# Patient Record
Sex: Male | Born: 1940 | Race: White | Hispanic: No | Marital: Married | Smoking: Former smoker
Health system: Southern US, Community
[De-identification: ages and names within clinical notes are randomized; demographics above are authoritative.]

## PROBLEM LIST (undated history)

## (undated) DIAGNOSIS — C801 Malignant (primary) neoplasm, unspecified: Secondary | ICD-10-CM

## (undated) DIAGNOSIS — H409 Unspecified glaucoma: Secondary | ICD-10-CM

## (undated) DIAGNOSIS — I10 Essential (primary) hypertension: Secondary | ICD-10-CM

## (undated) DIAGNOSIS — M1990 Unspecified osteoarthritis, unspecified site: Secondary | ICD-10-CM

## (undated) DIAGNOSIS — H269 Unspecified cataract: Secondary | ICD-10-CM

## (undated) DIAGNOSIS — E559 Vitamin D deficiency, unspecified: Secondary | ICD-10-CM

## (undated) DIAGNOSIS — N400 Enlarged prostate without lower urinary tract symptoms: Secondary | ICD-10-CM

## (undated) HISTORY — PX: CHOLECYSTECTOMY: SHX55

## (undated) HISTORY — PX: JOINT REPLACEMENT: SHX530

---

## 2000-03-14 ENCOUNTER — Other Ambulatory Visit: Admission: RE | Admit: 2000-03-14 | Discharge: 2000-03-14 | Payer: Self-pay | Admitting: Urology

## 2004-01-20 ENCOUNTER — Ambulatory Visit (HOSPITAL_COMMUNITY): Admission: RE | Admit: 2004-01-20 | Discharge: 2004-01-20 | Payer: Self-pay | Admitting: General Surgery

## 2004-01-31 ENCOUNTER — Encounter: Admission: RE | Admit: 2004-01-31 | Discharge: 2004-01-31 | Payer: Self-pay | Admitting: General Surgery

## 2004-02-01 ENCOUNTER — Ambulatory Visit (HOSPITAL_BASED_OUTPATIENT_CLINIC_OR_DEPARTMENT_OTHER): Admission: RE | Admit: 2004-02-01 | Discharge: 2004-02-01 | Payer: Self-pay | Admitting: General Surgery

## 2004-02-01 ENCOUNTER — Ambulatory Visit (HOSPITAL_COMMUNITY): Admission: RE | Admit: 2004-02-01 | Discharge: 2004-02-01 | Payer: Self-pay | Admitting: General Surgery

## 2004-02-08 ENCOUNTER — Encounter: Admission: RE | Admit: 2004-02-08 | Discharge: 2004-02-08 | Payer: Self-pay | Admitting: General Surgery

## 2005-07-21 IMAGING — CR DG CHEST 2V
2 series · 2 of 2 positions shown · non-contrast
Comparison: none

CLINICAL DATA: Melanoma.
 TWO VIEW CHEST 
 No priors. 
 Heart size upper normal.  No definite lung nodules.  However, there is a possible occult right midlung density overlying the seventh posterior rib.  This may be a confluent shadow of bones and vessels, but an occult right midlung nodule or mass cannot be excluded.  I would recommend a repeat chest x-ray and/or CT for further assessment.  No pleural fluid or osseous lesions. 
 IMPRESSION
 Cannot rule out occult right midlung mass.  See discussion.

[view not recorded (1 of 2)]
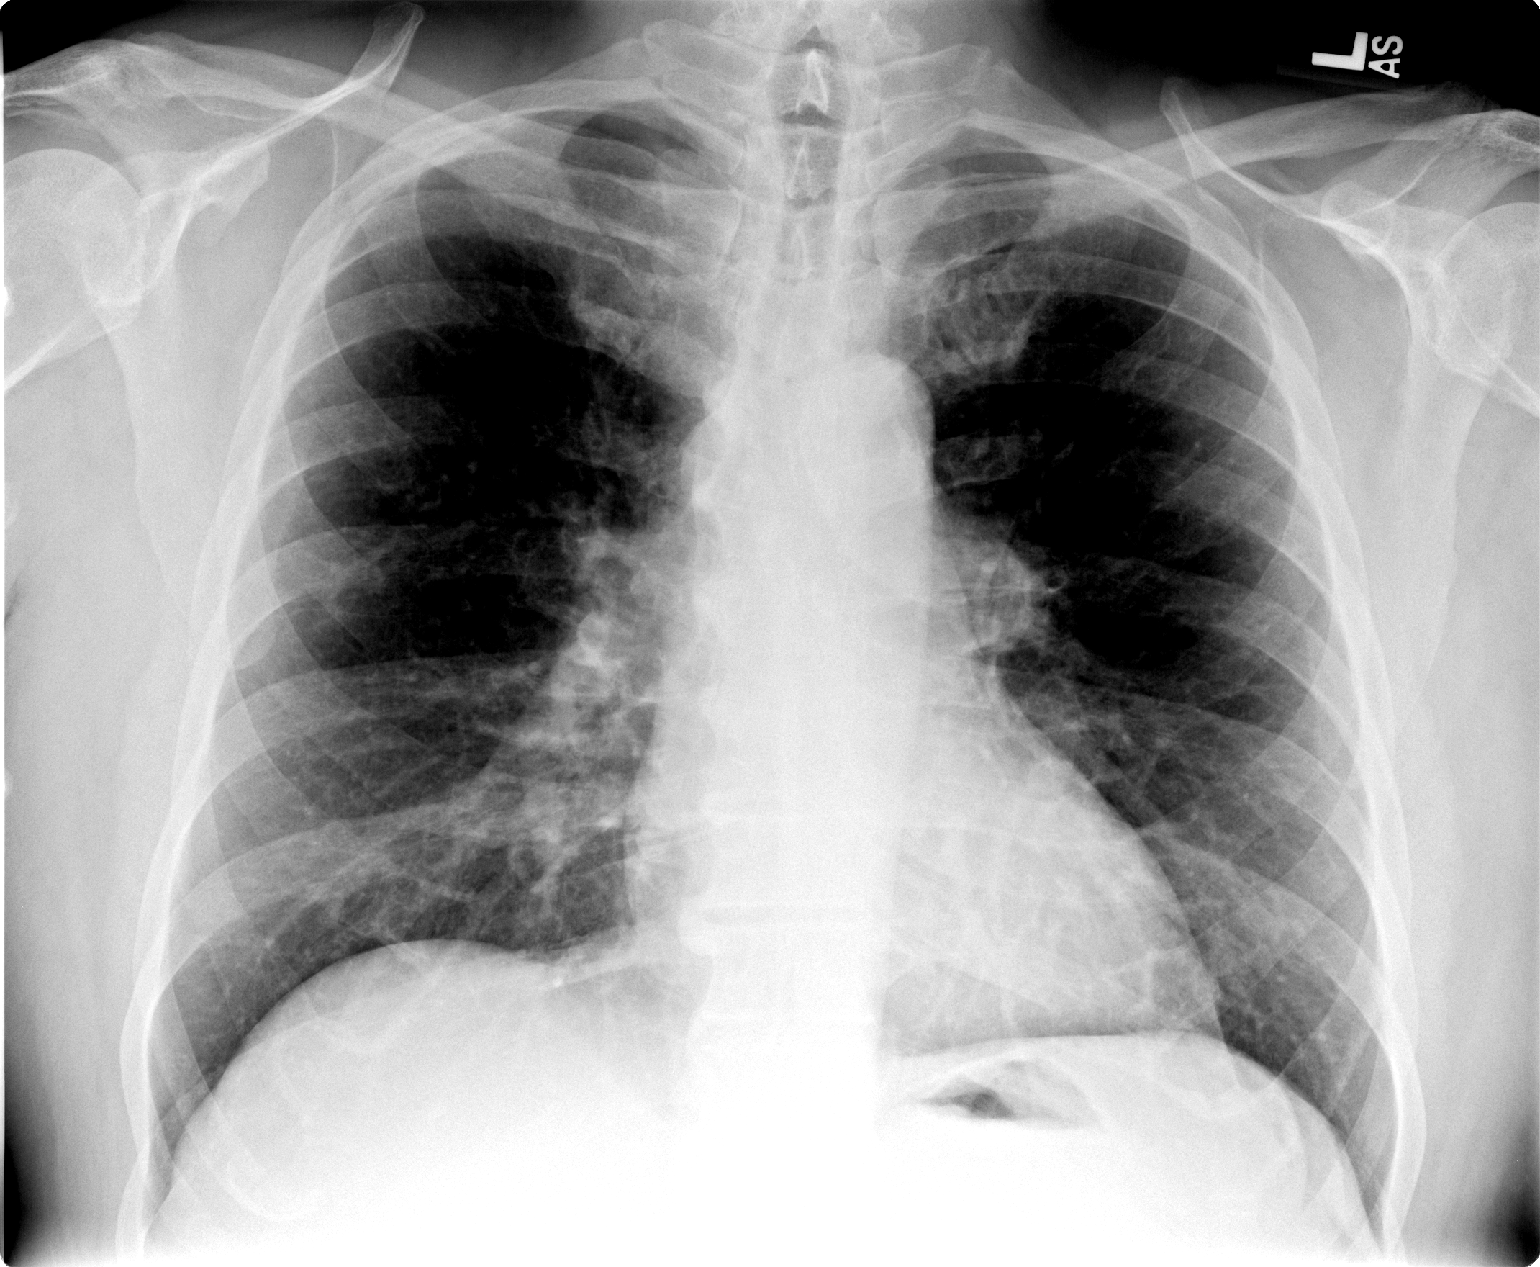

[view not recorded (2 of 2)]
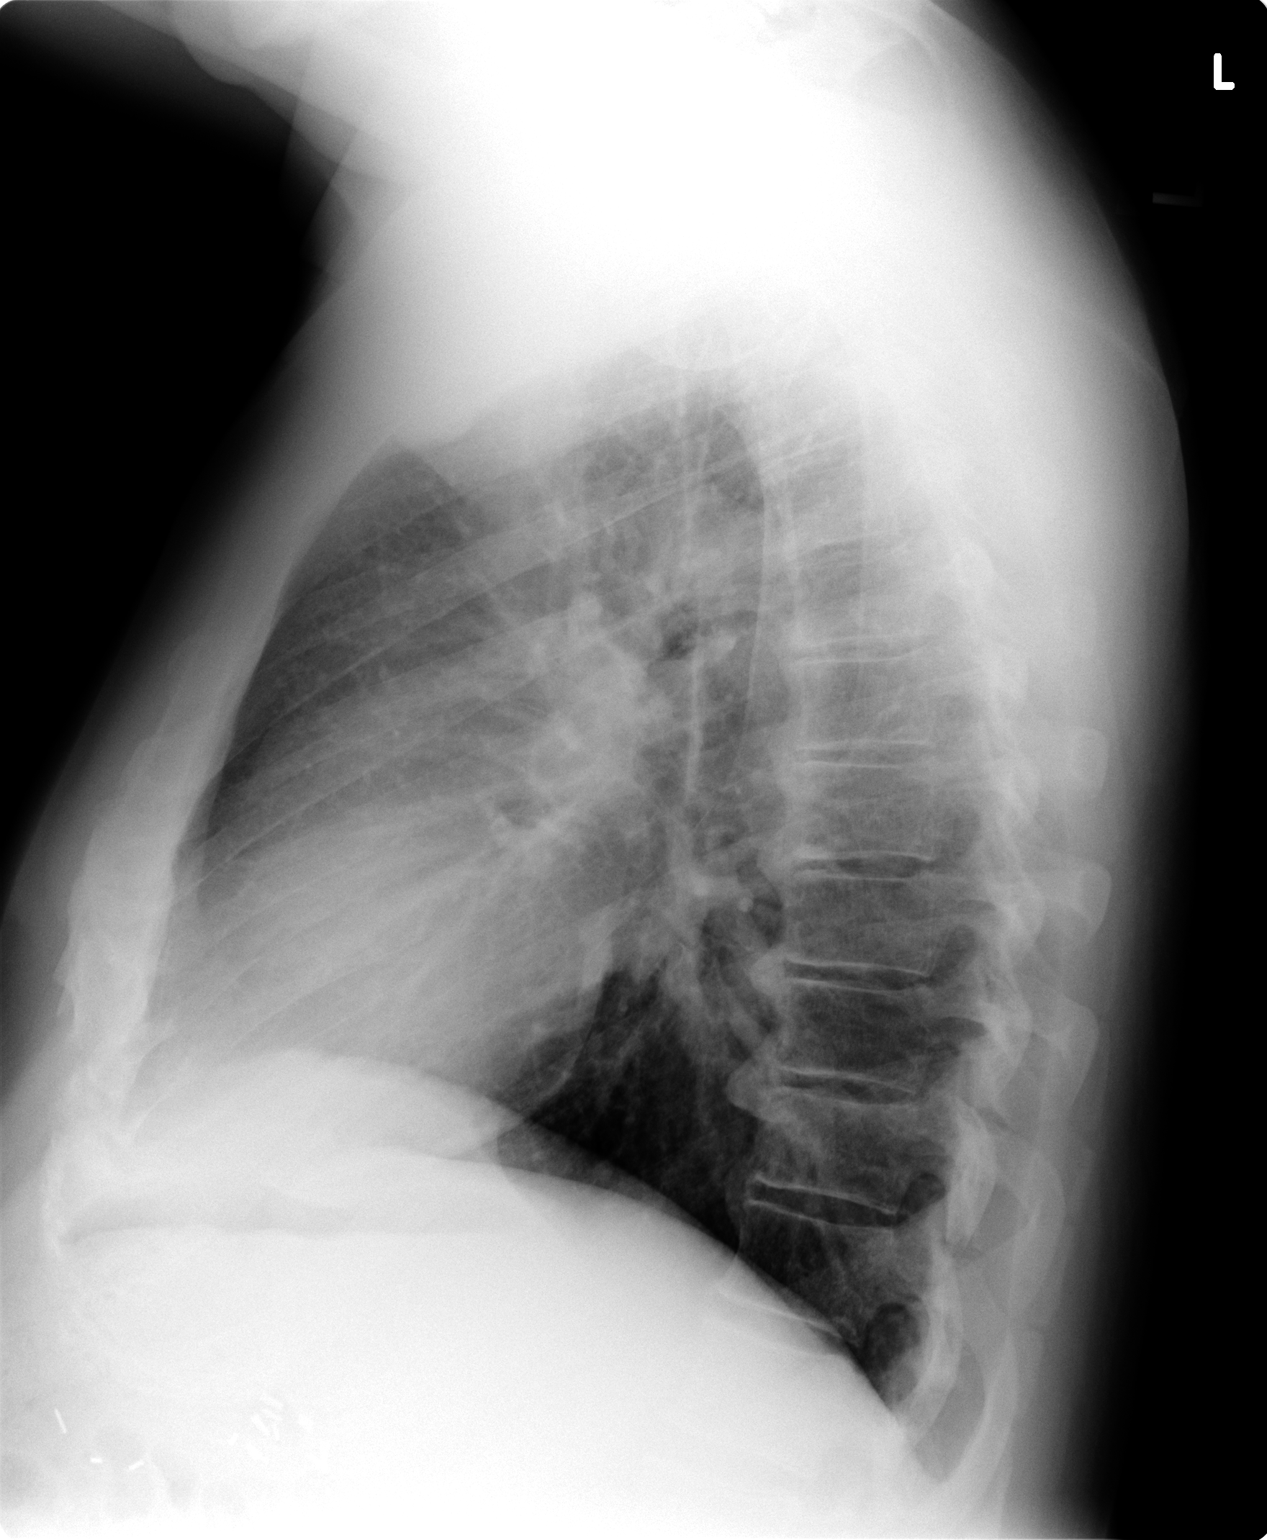

[2 of 2 positions shown; findings below may reference images not displayed]

## 2007-01-15 ENCOUNTER — Inpatient Hospital Stay (HOSPITAL_COMMUNITY): Admission: RE | Admit: 2007-01-15 | Discharge: 2007-01-18 | Payer: Self-pay | Admitting: Orthopedic Surgery

## 2010-11-05 ENCOUNTER — Encounter: Payer: Self-pay | Admitting: General Surgery

## 2011-01-30 ENCOUNTER — Other Ambulatory Visit: Payer: Self-pay | Admitting: Dermatology

## 2011-02-13 ENCOUNTER — Other Ambulatory Visit: Payer: Self-pay | Admitting: Gastroenterology

## 2013-02-06 ENCOUNTER — Other Ambulatory Visit: Payer: Self-pay | Admitting: Dermatology

## 2013-08-03 ENCOUNTER — Other Ambulatory Visit: Payer: Self-pay | Admitting: Dermatology

## 2014-02-02 ENCOUNTER — Other Ambulatory Visit: Payer: Self-pay | Admitting: Dermatology

## 2014-06-15 ENCOUNTER — Other Ambulatory Visit: Payer: Self-pay | Admitting: Dermatology

## 2014-06-29 ENCOUNTER — Ambulatory Visit (INDEPENDENT_AMBULATORY_CARE_PROVIDER_SITE_OTHER): Payer: Medicare Other | Admitting: General Surgery

## 2014-07-06 ENCOUNTER — Ambulatory Visit (INDEPENDENT_AMBULATORY_CARE_PROVIDER_SITE_OTHER): Payer: Medicare Other | Admitting: General Surgery

## 2014-09-30 ENCOUNTER — Other Ambulatory Visit: Payer: Self-pay | Admitting: Dermatology

## 2015-10-16 HISTORY — PX: OTHER SURGICAL HISTORY: SHX169

## 2016-03-27 ENCOUNTER — Other Ambulatory Visit: Payer: Self-pay | Admitting: Gastroenterology

## 2016-07-26 ENCOUNTER — Ambulatory Visit: Payer: Self-pay | Admitting: Orthopedic Surgery

## 2016-10-03 NOTE — Progress Notes (Signed)
Please update surgical orders; orders will expire after 90 days in epic. Thanks.  

## 2016-10-19 ENCOUNTER — Other Ambulatory Visit (HOSPITAL_COMMUNITY): Payer: Self-pay | Admitting: Emergency Medicine

## 2016-10-19 NOTE — Progress Notes (Signed)
07/06/2016: EKG in chart. [facility] Dr Orland Mustard,   10/10/2016: CBC with Diff, BMET, PT/PTT

## 2016-10-19 NOTE — Patient Instructions (Addendum)
Samuel Porter  10/19/2016   Your procedure is scheduled on: 10/31/2016  Report to [facility] Main  Entrance take [facility]  elevators to 3rd floor to  [place] at 1:10PM.  Call this number if you have problems the morning of surgery [phone]   Remember: ONLY 1 PERSON MAY GO WITH YOU TO SHORT STAY TO GET  READY MORNING OF [place].    Do not eat food After Midnight on 10/30/2016. YOU MAY HAVE CLEAR LIQUIDS (DIET LIST BELOW) UNTIL 1000AM DAY OF SURGERY on  10/31/2016. AFTER 1000AM, NOTHING BY MOUTH.     Take these medicines the morning of surgery with A SIP OF WATER: amlodipine(Norvasc), finasteride(Proscar), eye drops                                You may not have any metal on your body including hair pins and              piercings  Do not wear jewelry, make-up, lotions, powders or perfumes, deodorant             Do not wear nail polish.  Do not shave  48 hours prior to surgery.              Men may shave face and neck.   Do not bring valuables to the hospital. [place].  Contacts, dentures or bridgework may not be worn into surgery.  Leave suitcase in the car. After surgery it may be brought to your room.                Please read over the following fact sheets you were given: _____________________________________________________________________     CLEAR LIQUID DIET    Foods Allowed                                                                     Foods Excluded  Coffee and tea, regular and decaf                             liquids that you cannot  Plain Jell-O in any flavor                                             see through such as: Fruit ices (not with fruit pulp)                                     milk, soups, orange juice  Iced Popsicles                                    All solid food Carbonated beverages, regular and diet  Cranberry,  grape and apple juices Sports drinks like Gatorade Lightly seasoned clear broth or consume(fat free) Sugar, honey syrup  Sample Menu Breakfast                                Lunch                                     Supper Cranberry juice                    Beef broth                            Chicken broth Jell-O                                     Grape juice                           Apple juice Coffee or tea                        Jell-O                                      Popsicle                                                Coffee or tea                        Coffee or tea  _____________________________________________________________________             [facility] - Preparing for Surgery Before surgery, you can play an important role.  Because skin is not sterile, your skin needs to be as free of germs as possible.  You can reduce the number of germs on your skin by washing with CHG (chlorahexidine gluconate) soap before surgery.  CHG is an antiseptic cleaner which kills germs and bonds with the skin to continue killing germs even after washing. Please DO NOT use if you have an allergy to CHG or antibacterial soaps.  If your skin becomes reddened/irritated stop using the CHG and inform your nurse when you arrive at Short Stay. Do not shave (including legs and underarms) for at least 48 hours prior to the first CHG shower.  You may shave your face/neck. Please follow these instructions carefully:  1.  Shower with CHG Soap the night before surgery and the  morning of Surgery.  2.  If you choose to wash your hair, wash your hair first as usual with your  normal  shampoo.  3.  After you shampoo, rinse your hair and body thoroughly to remove the  shampoo.                           4.  Use CHG as you would any other liquid soap.  You can apply chg  directly  to the skin and wash                       Gently with a scrungie or clean washcloth.  5.  Apply the CHG Soap to your body ONLY  FROM THE NECK DOWN.   Do not use on face/ open                           Wound or open sores. Avoid contact with eyes, ears mouth and genitals (private parts).                       Wash face,  Genitals (private parts) with your normal soap.             6.  Wash thoroughly, paying special attention to the area where your surgery  will be performed.  7.  Thoroughly rinse your body with warm water from the neck down.  8.  DO NOT shower/wash with your normal soap after using and rinsing off  the CHG Soap.                9.  Pat yourself dry with a clean towel.            10.  Wear clean pajamas.            11.  Place clean sheets on your bed the night of your first shower and do not  sleep with pets. Day of Surgery : Do not apply any lotions/deodorants the morning of surgery.  Please wear clean clothes to the hospital/surgery center.  FAILURE TO FOLLOW THESE INSTRUCTIONS MAY RESULT IN THE CANCELLATION OF YOUR SURGERY PATIENT SIGNATURE_________________________________  NURSE SIGNATURE__________________________________  ________________________________________________________________________   Adam Phenix  An incentive spirometer is a tool that can help keep your lungs clear and active. This tool measures how well you are filling your lungs with each breath. Taking long deep breaths may help reverse or decrease the chance of developing breathing (pulmonary) problems (especially infection) following:  A long period of time when you are unable to move or be active. BEFORE THE PROCEDURE   If the spirometer includes an indicator to show your best effort, your nurse or respiratory therapist will set it to a desired goal.  If possible, sit up straight or lean slightly forward. Try not to slouch.  Hold the incentive spirometer in an upright position. INSTRUCTIONS FOR USE  1. Sit on the edge of your bed if possible, or sit up as far as you can in bed or on a chair. 2. Hold the incentive  spirometer in an upright position. 3. Breathe out normally. 4. Place the mouthpiece in your mouth and seal your lips tightly around it. 5. Breathe in slowly and as deeply as possible, raising the piston or the ball toward the top of the column. 6. Hold your breath for 3-5 seconds or for as long as possible. Allow the piston or ball to fall to the bottom of the column. 7. Remove the mouthpiece from your mouth and breathe out normally. 8. Rest for a few seconds and repeat Steps 1 through 7 at least 10 times every 1-2 hours when you are awake. Take your time and take a few normal breaths between deep breaths. 9. The spirometer may include an indicator to show your best effort. Use the indicator as a goal to work toward during each repetition.  10. After each set of 10 deep breaths, practice coughing to be sure your lungs are clear. If you have an incision (the cut made at the time of surgery), support your incision when coughing by placing a pillow or rolled up towels firmly against it. Once you are able to get out of bed, walk around indoors and cough well. You may stop using the incentive spirometer when instructed by your caregiver.  RISKS AND COMPLICATIONS  Take your time so you do not get dizzy or light-headed.  If you are in pain, you may need to take or ask for pain medication before doing incentive spirometry. It is harder to take a deep breath if you are having pain. AFTER USE  Rest and breathe slowly and easily.  It can be helpful to keep track of a log of your progress. Your caregiver can provide you with a simple table to help with this. If you are using the spirometer at home, follow these instructions: [place] IF:   You are having difficultly using the spirometer.  You have trouble using the spirometer as often as instructed.  Your pain medication is not giving enough relief while using the spirometer.  You develop fever of 100.5 F (38.1 C) or higher. SEEK  IMMEDIATE MEDICAL CARE IF:   You cough up bloody sputum that had not been present before.  You develop fever of 102 F (38.9 C) or greater.  You develop worsening pain at or near the incision site. MAKE SURE YOU:   Understand these instructions.  Will watch your condition.  Will get help right away if you are not doing well or get worse. Document Released: 02/11/2007 Document Revised: 12/24/2011 Document Reviewed: 04/14/2007 ExitCare Patient Information 2014 ExitCare, [place].   ________________________________________________________________________  WHAT IS A BLOOD TRANSFUSION? Blood Transfusion Information  A transfusion is the replacement of blood or some of its parts. Blood is made up of multiple cells which provide different functions.  Red blood cells carry oxygen and are used for blood loss replacement.  White blood cells fight against infection.  Platelets control bleeding.  Plasma helps clot blood.  Other blood products are available for specialized needs, such as hemophilia or other clotting disorders. BEFORE THE TRANSFUSION  Who gives blood for transfusions?   Healthy volunteers who are fully evaluated to make sure their blood is safe. This is blood bank blood. Transfusion therapy is the safest it has ever been in the practice of medicine. Before blood is taken from a donor, a complete history is taken to make sure that person has no history of diseases nor engages in risky social behavior (examples are intravenous drug use or sexual activity with multiple partners). The donor's travel history is screened to minimize risk of transmitting infections, such as malaria. The donated blood is tested for signs of infectious diseases, such as HIV and hepatitis. The blood is then tested to be sure it is compatible with you in order to minimize the chance of a transfusion reaction. If you or a relative donates blood, this is often done in anticipation of surgery and is not  appropriate for emergency situations. It takes many days to process the donated blood. RISKS AND COMPLICATIONS Although transfusion therapy is very safe and saves many lives, the main dangers of transfusion include:   Getting an infectious disease.  Developing a transfusion reaction. This is an allergic reaction to something in the blood you were given. Every precaution is taken to prevent this. The  decision to have a blood transfusion has been considered carefully by your caregiver before blood is given. Blood is not given unless the benefits outweigh the risks. AFTER THE TRANSFUSION  Right after receiving a blood transfusion, you will usually feel much better and more energetic. This is especially true if your red blood cells have gotten low (anemic). The transfusion raises the level of the red blood cells which carry oxygen, and this usually causes an energy increase.  The nurse administering the transfusion will monitor you carefully for complications. HOME CARE INSTRUCTIONS  No special instructions are needed after a transfusion. You may find your energy is better. Speak with your caregiver about any limitations on activity for underlying diseases you may have. SEEK MEDICAL CARE IF:   Your condition is not improving after your transfusion.  You develop redness or irritation at the intravenous (IV) site. SEEK IMMEDIATE MEDICAL CARE IF:  Any of the following symptoms occur over the next 12 hours:  Shaking chills.  You have a temperature by mouth above 102 F (38.9 C), not controlled by medicine.  Chest, back, or muscle pain.  People around you feel you are not acting correctly or are confused.  Shortness of breath or difficulty breathing.  Dizziness and fainting.  You get a rash or develop hives.  You have a decrease in urine output.  Your urine turns a dark color or changes to pink, red, or brown. Any of the following symptoms occur over the next 10 days:  You have a  temperature by mouth above 102 F (38.9 C), not controlled by medicine.  Shortness of breath.  Weakness after normal activity.  The white part of the eye turns yellow (jaundice).  You have a decrease in the amount of urine or are urinating less often.  Your urine turns a dark color or changes to pink, red, or brown. Document Released: 09/28/2000 Document Revised: 12/24/2011 Document Reviewed: 05/17/2008 [facility] Patient Information 2014 [place], [place].  _______________________________________________________________________

## 2016-10-23 ENCOUNTER — Ambulatory Visit: Payer: Self-pay | Admitting: Orthopedic Surgery

## 2016-10-23 ENCOUNTER — Encounter (HOSPITAL_COMMUNITY): Admission: RE | Admit: 2016-10-23 | Payer: Medicare Other | Source: Ambulatory Visit

## 2016-10-23 ENCOUNTER — Encounter (HOSPITAL_COMMUNITY)
Admission: RE | Admit: 2016-10-23 | Discharge: 2016-10-23 | Disposition: A | Discharge status: Home / Self Care | Payer: Medicare Other | Source: Ambulatory Visit | Attending: Orthopedic Surgery | Admitting: Orthopedic Surgery

## 2016-10-23 ENCOUNTER — Encounter (HOSPITAL_COMMUNITY): Payer: Self-pay

## 2016-10-23 DIAGNOSIS — M1712 Unilateral primary osteoarthritis, left knee: Secondary | ICD-10-CM | POA: Insufficient documentation

## 2016-10-23 DIAGNOSIS — Z01818 Encounter for other preprocedural examination: Secondary | ICD-10-CM | POA: Insufficient documentation

## 2016-10-23 HISTORY — DX: Benign prostatic hyperplasia without lower urinary tract symptoms: N40.0

## 2016-10-23 HISTORY — DX: Vitamin D deficiency, unspecified: E55.9

## 2016-10-23 HISTORY — DX: Unspecified osteoarthritis, unspecified site: M19.90

## 2016-10-23 HISTORY — DX: Malignant (primary) neoplasm, unspecified: C80.1

## 2016-10-23 HISTORY — DX: Unspecified glaucoma: H40.9

## 2016-10-23 HISTORY — DX: Unspecified cataract: H26.9

## 2016-10-23 HISTORY — DX: Essential (primary) hypertension: I10

## 2016-10-23 LAB — URINALYSIS, ROUTINE W REFLEX MICROSCOPIC
Bilirubin Urine: NEGATIVE
GLUCOSE, UA: NEGATIVE mg/dL
Hgb urine dipstick: NEGATIVE
KETONES UR: NEGATIVE mg/dL
LEUKOCYTES UA: NEGATIVE
Nitrite: NEGATIVE
PH: 7 (ref 5.0–8.0)
Protein, ur: NEGATIVE mg/dL
SPECIFIC GRAVITY, URINE: 1.013 (ref 1.005–1.030)

## 2016-10-23 LAB — HEPATIC FUNCTION PANEL
ALK PHOS: 44 U/L (ref 38–126)
ALT: 14 U/L — ABNORMAL LOW (ref 17–63)
AST: 16 U/L (ref 15–41)
Albumin: 4 g/dL (ref 3.5–5.0)
BILIRUBIN DIRECT: 0.2 mg/dL (ref 0.1–0.5)
BILIRUBIN TOTAL: 1.2 mg/dL (ref 0.3–1.2)
Indirect Bilirubin: 1 mg/dL — ABNORMAL HIGH (ref 0.3–0.9)
Total Protein: 7.3 g/dL (ref 6.5–8.1)

## 2016-10-23 LAB — SURGICAL PCR SCREEN
MRSA, PCR: NEGATIVE
STAPHYLOCOCCUS AUREUS: NEGATIVE

## 2016-10-23 LAB — ABO/RH: ABO/RH(D): A POS

## 2016-10-23 NOTE — Progress Notes (Signed)
PATIENT SAW IN PERE OP TODAY, ORDERS PUT IN FOR SURGERY 07-26-16 WILL DROP OUT BEFORE SURGERY DATE OF 10-31-16, PLEASE ONLY PUT IN WHAT YOU WANT DAY OF SURGERY, SINCE ALL PRE OP ORDERS HAVE BEEN COMPLETED.

## 2016-10-30 ENCOUNTER — Ambulatory Visit: Payer: Self-pay | Admitting: Orthopedic Surgery

## 2016-10-30 NOTE — H&P (Signed)
Samuel Porter DOB: [birth year] Married / Language: English / Race: White Male Date of Admission:  10/31/2016  CC:  Left Hip Pain History of Present Illness  The patient is a 76 year old male who comes in for a preoperative History and Physical. The patient is scheduled for a left total hip arthroplasty (anterior) to be performed by Dr. Dione Plover. [name], MD at [facility] on 10-31-2016. The patient is a 76 year old male who presented for follow up of their hip. The patient is being followed for their left hip pain and osteoarthritis. They are months out from intra-articular injection (left hip). Symptoms reported include: pain, pain at night, aching and difficulty ambulating. The patient feels that they are doing well and report their pain level to be moderate to severe. The following medication has been used for pain control: aspirin (prn). The patient has reported improvement of their symptoms with: Cortisone injections. [name]'s hip unfortunately has gotten much worse. We replaced his right hip several years ago, he did great with that. It was done via posterior approach. The left hip is hurting him at all times, it is bad as the right hip was prior to when he had surgery on it. He said that it is definitely limiting what he can and cannot do. Cortisone injection helped to a certain degree, but has not allowed him to be more active. He is ready to proceed with surgery on the left hip at this time. They have been treated conservatively in the past for the above stated problem and despite conservative measures, they continue to have progressive pain and severe functional limitations and dysfunction. They have failed non-operative management including home exercise, medications, and injections. It is felt that they would benefit from undergoing total joint replacement. Risks and benefits of the procedure have been discussed with the patient and they elect to proceed with surgery. There are no  active contraindications to surgery such as ongoing infection or rapidly progressive neurological disease.  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)  Primary osteoarthritis of left hip (M16.12)  High blood pressure  Skin Cancer  Glaucoma  Enlarged prostate   Allergies No Known Drug Allergies   Family History Cancer  mother, father and sister Heart Disease  brother  Social History Alcohol use  current drinker; drinks beer and wine; 5-7 per week Children  4 Current work status  retired Engineer, agricultural (Currently)  no Drug/Alcohol Rehab (Previously)  no Exercise  Exercises weekly; does other Illicit drug use  no Living situation  live with spouse Marital status  married Number of flights of stairs before winded  4-5 Pain Contract  no Tobacco / smoke exposure  no Tobacco use  former smoker; smoke(d) 1 pack(s) per day Advance Directives  Living Will, Healthcare POA  Medication History AmLODIPine Besylate (5MG  Tablet, Oral) Active. Cosopt (Ophthalmic) Specific strength unknown - Active. Xalatan (Ophthalmic) Specific strength unknown - Active. Finasteride (5MG  Tablet, Oral) Active. ASA Arthritis Strength/Antacid (Oral) Specific strength unknown - Active. Vit D-Vit E-Safflower Oil (External) Active.  Past Surgical History Colon Polyp Removal - Colonoscopy  Gallbladder Surgery  open Tonsillectomy  Total Hip Replacement  right Mohs Procedure    Review of Systems  General Not Present- Chills, Fatigue, Fever, Memory Loss, Night Sweats, Weight Gain and Weight Loss. Skin Not Present- Eczema, Hives, Itching, Lesions and Rash. HEENT Not Present- Dentures, Double Vision, Headache, Hearing Loss, Tinnitus and Visual Loss. Respiratory Not Present- Allergies, Chronic Cough, Coughing  up blood, Shortness of breath at rest and Shortness of breath with exertion. Cardiovascular Not Present- Chest Pain, Difficulty Breathing  Lying Down, Murmur, Palpitations, Racing/skipping heartbeats and Swelling. Gastrointestinal Not Present- Abdominal Pain, Bloody Stool, Constipation, Diarrhea, Difficulty Swallowing, Heartburn, Jaundice, Loss of appetitie, Nausea and Vomiting. Male Genitourinary Not Present- Blood in Urine, Discharge, Flank Pain, Incontinence, Painful Urination, Urgency, Urinary frequency, Urinary Retention, Urinating at Night and Weak urinary stream. Musculoskeletal Present- Joint Pain and Morning Stiffness. Not Present- Back Pain, Joint Swelling, Muscle Pain, Muscle Weakness and Spasms. Neurological Not Present- Blackout spells, Difficulty with balance, Dizziness, Paralysis, Tremor and Weakness. Psychiatric Not Present- Insomnia.  Vitals Weight: 215 lb Height: 74in Weight was reported by patient. Height was reported by patient. Body Surface Area: 2.24 m Body Mass Index: 27.6 kg/m  Pulse: 56 (Regular)  BP: 132/70 (Sitting, Left Arm, Standard)  Physical Exam General Mental Status -Alert, cooperative and good historian. General Appearance-pleasant, Not in acute distress. Orientation-Oriented X3. Build & Nutrition-Well nourished and Well developed.  Head and Neck Head-normocephalic, atraumatic . Neck Global Assessment - supple, no bruit auscultated on the right, no bruit auscultated on the left.  Eye Pupil - Bilateral-Regular and Round. Motion - Bilateral-EOMI.  Chest and Lung Exam Auscultation Breath sounds - clear at anterior chest wall and clear at posterior chest wall. Adventitious sounds - No Adventitious sounds.  Cardiovascular Auscultation Rhythm - Regular rate and rhythm. Heart Sounds - S1 WNL and S2 WNL. Murmurs & Other Heart Sounds - Auscultation of the heart reveals - No Murmurs.  Abdomen Palpation/Percussion Tenderness - Abdomen is non-tender to palpation. Rigidity (guarding) - Abdomen is soft. Auscultation Auscultation of the abdomen reveals - Bowel sounds  normal.  Male Genitourinary Note: Not done, not pertinent to present illness   Musculoskeletal Note: On exam, he is in no distress. His left hip can be flexed to about 90 with no internal rotation about 10 degrees of external rotation, 20 degrees abduction. Right hip flexion 120, rotation in 30 out 40, abduct 40 without discomfort.  IMAGING We reviewed his radiographs, AP pelvis and lateral of the left hip, he has got severe bone on bone arthritis of the left hip with subchondral cystic formation.  Assessment & Plan  Primary osteoarthritis of left hip (M16.12)  Note:Surgical Plans: Left Total Hip Replacement - Anterior Approach  Disposition: Home  PCP: Dr. Orland Mustard [facility]  IV TXA  Anesthesia Issues: None  Signed electronically by Ok Edwards, III PA-C

## 2016-10-31 ENCOUNTER — Inpatient Hospital Stay (HOSPITAL_COMMUNITY)
Admission: RE | Admit: 2016-10-31 | Discharge: 2016-11-02 | DRG: 470 | Disposition: A | Discharge status: Home / Self Care | Payer: Medicare Other | Source: Ambulatory Visit | Attending: Orthopedic Surgery | Admitting: Orthopedic Surgery

## 2016-10-31 ENCOUNTER — Inpatient Hospital Stay (HOSPITAL_COMMUNITY): Payer: Medicare Other | Admitting: Certified Registered Nurse Anesthetist

## 2016-10-31 ENCOUNTER — Inpatient Hospital Stay (HOSPITAL_COMMUNITY): Payer: Medicare Other

## 2016-10-31 ENCOUNTER — Encounter (HOSPITAL_COMMUNITY): Payer: Self-pay | Admitting: *Deleted

## 2016-10-31 ENCOUNTER — Encounter (HOSPITAL_COMMUNITY)
Admission: RE | Disposition: A | Discharge status: Home / Self Care | Payer: Self-pay | Source: Ambulatory Visit | Attending: Orthopedic Surgery

## 2016-10-31 DIAGNOSIS — H269 Unspecified cataract: Secondary | ICD-10-CM | POA: Diagnosis present

## 2016-10-31 DIAGNOSIS — E559 Vitamin D deficiency, unspecified: Secondary | ICD-10-CM | POA: Diagnosis present

## 2016-10-31 DIAGNOSIS — Z96649 Presence of unspecified artificial hip joint: Secondary | ICD-10-CM

## 2016-10-31 DIAGNOSIS — N400 Enlarged prostate without lower urinary tract symptoms: Secondary | ICD-10-CM | POA: Diagnosis present

## 2016-10-31 DIAGNOSIS — M1612 Unilateral primary osteoarthritis, left hip: Principal | ICD-10-CM | POA: Diagnosis present

## 2016-10-31 DIAGNOSIS — M720 Palmar fascial fibromatosis [Dupuytren]: Secondary | ICD-10-CM | POA: Diagnosis present

## 2016-10-31 DIAGNOSIS — I10 Essential (primary) hypertension: Secondary | ICD-10-CM | POA: Diagnosis present

## 2016-10-31 DIAGNOSIS — Z85820 Personal history of malignant melanoma of skin: Secondary | ICD-10-CM

## 2016-10-31 DIAGNOSIS — H409 Unspecified glaucoma: Secondary | ICD-10-CM | POA: Diagnosis present

## 2016-10-31 DIAGNOSIS — Z87891 Personal history of nicotine dependence: Secondary | ICD-10-CM

## 2016-10-31 DIAGNOSIS — M169 Osteoarthritis of hip, unspecified: Secondary | ICD-10-CM | POA: Diagnosis present

## 2016-10-31 DIAGNOSIS — Z79899 Other long term (current) drug therapy: Secondary | ICD-10-CM | POA: Diagnosis not present

## 2016-10-31 HISTORY — PX: TOTAL HIP ARTHROPLASTY: SHX124

## 2016-10-31 LAB — TYPE AND SCREEN
ABO/RH(D): A POS
ANTIBODY SCREEN: NEGATIVE

## 2016-10-31 SURGERY — ARTHROPLASTY, HIP, TOTAL, ANTERIOR APPROACH
Anesthesia: Spinal | Site: Hip | Laterality: Left

## 2016-10-31 MED ORDER — DOCUSATE SODIUM 100 MG PO CAPS
100.0000 mg | ORAL_CAPSULE | Freq: Two times a day (BID) | ORAL | Status: DC
  Administered 2016-10-31 – 2016-11-02 (×4): 100 mg via ORAL
  Filled 2016-10-31 (×4): qty 1

## 2016-10-31 MED ORDER — DORZOLAMIDE HCL-TIMOLOL MAL 2-0.5 % OP SOLN: 1.0000 [drp] | Freq: Two times a day (BID) | OPHTHALMIC | Status: DC

## 2016-10-31 MED ORDER — CEFAZOLIN SODIUM-DEXTROSE 2-4 GM/100ML-% IV SOLN
2.0000 g | INTRAVENOUS | Status: AC
  Administered 2016-10-31: 2 g via INTRAVENOUS
  Filled 2016-10-31: qty 100

## 2016-10-31 MED ORDER — PROPOFOL 500 MG/50ML IV EMUL
INTRAVENOUS | Status: DC | PRN
  Administered 2016-10-31: 75 ug/kg/min via INTRAVENOUS

## 2016-10-31 MED ORDER — ACETAMINOPHEN 10 MG/ML IV SOLN
INTRAVENOUS | Status: AC
  Filled 2016-10-31: qty 100

## 2016-10-31 MED ORDER — PROPOFOL 500 MG/50ML IV EMUL
INTRAVENOUS | Status: DC | PRN
  Administered 2016-10-31 (×2): 30 mg via INTRAVENOUS

## 2016-10-31 MED ORDER — DEXAMETHASONE SODIUM PHOSPHATE 10 MG/ML IJ SOLN
10.0000 mg | Freq: Once | INTRAMUSCULAR | Status: AC
  Administered 2016-11-01: 10 mg via INTRAVENOUS
  Filled 2016-10-31: qty 1

## 2016-10-31 MED ORDER — BISACODYL 10 MG RE SUPP: 10.0000 mg | Freq: Every day | RECTAL | Status: DC | PRN

## 2016-10-31 MED ORDER — PROPOFOL 10 MG/ML IV BOLUS
INTRAVENOUS | Status: AC
  Filled 2016-10-31: qty 20

## 2016-10-31 MED ORDER — METHOCARBAMOL 500 MG PO TABS
500.0000 mg | ORAL_TABLET | Freq: Four times a day (QID) | ORAL | Status: DC | PRN
  Administered 2016-11-01 – 2016-11-02 (×3): 500 mg via ORAL
  Filled 2016-10-31 (×4): qty 1

## 2016-10-31 MED ORDER — FLEET ENEMA 7-19 GM/118ML RE ENEM: 1.0000 | ENEMA | Freq: Once | RECTAL | Status: DC | PRN

## 2016-10-31 MED ORDER — FENTANYL CITRATE (PF) 100 MCG/2ML IJ SOLN
INTRAMUSCULAR | Status: DC | PRN
  Administered 2016-10-31: 100 ug via INTRAVENOUS

## 2016-10-31 MED ORDER — PROPOFOL 10 MG/ML IV BOLUS
INTRAVENOUS | Status: AC
  Filled 2016-10-31: qty 40

## 2016-10-31 MED ORDER — MORPHINE SULFATE (PF) 2 MG/ML IV SOLN: 1.0000 mg | INTRAVENOUS | Status: DC | PRN

## 2016-10-31 MED ORDER — ACETAMINOPHEN 10 MG/ML IV SOLN
1000.0000 mg | Freq: Once | INTRAVENOUS | Status: AC
  Administered 2016-10-31: 1000 mg via INTRAVENOUS
  Filled 2016-10-31: qty 100

## 2016-10-31 MED ORDER — CHLORHEXIDINE GLUCONATE 4 % EX LIQD: 60.0000 mL | Freq: Once | CUTANEOUS | Status: DC

## 2016-10-31 MED ORDER — MEPERIDINE HCL 50 MG/ML IJ SOLN: 6.2500 mg | INTRAMUSCULAR | Status: DC | PRN

## 2016-10-31 MED ORDER — MIDAZOLAM HCL 5 MG/5ML IJ SOLN
INTRAMUSCULAR | Status: DC | PRN
  Administered 2016-10-31: 2 mg via INTRAVENOUS

## 2016-10-31 MED ORDER — ONDANSETRON HCL 4 MG/2ML IJ SOLN: 4.0000 mg | Freq: Once | INTRAMUSCULAR | Status: DC | PRN

## 2016-10-31 MED ORDER — POLYETHYLENE GLYCOL 3350 17 G PO PACK: 17.0000 g | PACK | Freq: Every day | ORAL | Status: DC | PRN

## 2016-10-31 MED ORDER — CEFAZOLIN SODIUM-DEXTROSE 2-4 GM/100ML-% IV SOLN
INTRAVENOUS | Status: AC
  Filled 2016-10-31: qty 100

## 2016-10-31 MED ORDER — BUPIVACAINE HCL (PF) 0.25 % IJ SOLN
INTRAMUSCULAR | Status: DC | PRN
  Administered 2016-10-31: 30 mL

## 2016-10-31 MED ORDER — BUPIVACAINE HCL (PF) 0.5 % IJ SOLN
INTRAMUSCULAR | Status: DC | PRN
  Administered 2016-10-31: 3 mL

## 2016-10-31 MED ORDER — DORZOLAMIDE HCL 2 % OP SOLN
1.0000 [drp] | Freq: Two times a day (BID) | OPHTHALMIC | Status: DC
  Administered 2016-10-31 – 2016-11-02 (×4): 1 [drp] via OPHTHALMIC
  Filled 2016-10-31: qty 10

## 2016-10-31 MED ORDER — METOCLOPRAMIDE HCL 5 MG PO TABS: 5.0000 mg | ORAL_TABLET | Freq: Three times a day (TID) | ORAL | Status: DC | PRN

## 2016-10-31 MED ORDER — LATANOPROST 0.005 % OP SOLN
1.0000 [drp] | Freq: Every day | OPHTHALMIC | Status: DC
  Administered 2016-10-31 – 2016-11-01 (×2): 1 [drp] via OPHTHALMIC
  Filled 2016-10-31: qty 2.5

## 2016-10-31 MED ORDER — SODIUM CHLORIDE 0.9 % IV SOLN: 1000.0000 mg | INTRAVENOUS | Status: DC

## 2016-10-31 MED ORDER — ACETAMINOPHEN 500 MG PO TABS
1000.0000 mg | ORAL_TABLET | Freq: Four times a day (QID) | ORAL | Status: AC
  Administered 2016-10-31 – 2016-11-01 (×4): 1000 mg via ORAL
  Filled 2016-10-31 (×4): qty 2

## 2016-10-31 MED ORDER — ONDANSETRON HCL 4 MG PO TABS: 4.0000 mg | ORAL_TABLET | Freq: Four times a day (QID) | ORAL | Status: DC | PRN

## 2016-10-31 MED ORDER — 0.9 % SODIUM CHLORIDE (POUR BTL) OPTIME
TOPICAL | Status: DC | PRN
  Administered 2016-10-31: 1000 mL

## 2016-10-31 MED ORDER — PHENOL 1.4 % MT LIQD: 1.0000 | OROMUCOSAL | Status: DC | PRN

## 2016-10-31 MED ORDER — HYDROMORPHONE HCL 1 MG/ML IJ SOLN: 0.2500 mg | INTRAMUSCULAR | Status: DC | PRN

## 2016-10-31 MED ORDER — DEXAMETHASONE SODIUM PHOSPHATE 10 MG/ML IJ SOLN
10.0000 mg | Freq: Once | INTRAMUSCULAR | Status: AC
  Administered 2016-10-31: 10 mg via INTRAVENOUS

## 2016-10-31 MED ORDER — ONDANSETRON HCL 4 MG/2ML IJ SOLN: 4.0000 mg | Freq: Four times a day (QID) | INTRAMUSCULAR | Status: DC | PRN

## 2016-10-31 MED ORDER — FINASTERIDE 5 MG PO TABS
5.0000 mg | ORAL_TABLET | Freq: Every day | ORAL | Status: DC
  Administered 2016-11-01 – 2016-11-02 (×2): 5 mg via ORAL
  Filled 2016-10-31 (×2): qty 1

## 2016-10-31 MED ORDER — TRANEXAMIC ACID 1000 MG/10ML IV SOLN
1000.0000 mg | Freq: Once | INTRAVENOUS | Status: AC
  Administered 2016-10-31: 19:00:00 1000 mg via INTRAVENOUS
  Filled 2016-10-31: qty 10

## 2016-10-31 MED ORDER — ACETAMINOPHEN 650 MG RE SUPP: 650.0000 mg | Freq: Four times a day (QID) | RECTAL | Status: DC | PRN

## 2016-10-31 MED ORDER — MENTHOL 3 MG MT LOZG: 1.0000 | LOZENGE | OROMUCOSAL | Status: DC | PRN

## 2016-10-31 MED ORDER — SODIUM CHLORIDE 0.9 % IV SOLN
INTRAVENOUS | Status: DC
  Administered 2016-10-31: 18:00:00 100 mL/h via INTRAVENOUS

## 2016-10-31 MED ORDER — MIDAZOLAM HCL 2 MG/2ML IJ SOLN
INTRAMUSCULAR | Status: AC
  Filled 2016-10-31: qty 2

## 2016-10-31 MED ORDER — TRAMADOL HCL 50 MG PO TABS: 50.0000 mg | ORAL_TABLET | Freq: Four times a day (QID) | ORAL | Status: DC | PRN

## 2016-10-31 MED ORDER — EPHEDRINE SULFATE 50 MG/ML IJ SOLN
INTRAMUSCULAR | Status: DC | PRN
  Administered 2016-10-31 (×2): 10 mg via INTRAVENOUS

## 2016-10-31 MED ORDER — FENTANYL CITRATE (PF) 100 MCG/2ML IJ SOLN
INTRAMUSCULAR | Status: AC
  Filled 2016-10-31: qty 2

## 2016-10-31 MED ORDER — RIVAROXABAN 10 MG PO TABS
10.0000 mg | ORAL_TABLET | Freq: Every day | ORAL | Status: DC
  Administered 2016-11-01 – 2016-11-02 (×2): 10 mg via ORAL
  Filled 2016-10-31 (×2): qty 1

## 2016-10-31 MED ORDER — AMLODIPINE BESYLATE 5 MG PO TABS
5.0000 mg | ORAL_TABLET | Freq: Every day | ORAL | Status: DC
  Administered 2016-11-01 – 2016-11-02 (×2): 5 mg via ORAL
  Filled 2016-10-31 (×2): qty 1

## 2016-10-31 MED ORDER — TRANEXAMIC ACID 1000 MG/10ML IV SOLN
1000.0000 mg | INTRAVENOUS | Status: AC
  Administered 2016-10-31: 1000 mg via INTRAVENOUS
  Filled 2016-10-31: qty 10

## 2016-10-31 MED ORDER — METHOCARBAMOL 1000 MG/10ML IJ SOLN
500.0000 mg | Freq: Four times a day (QID) | INTRAVENOUS | Status: DC | PRN
  Administered 2016-10-31: 500 mg via INTRAVENOUS
  Filled 2016-10-31: qty 550
  Filled 2016-10-31: qty 5

## 2016-10-31 MED ORDER — CEFAZOLIN SODIUM-DEXTROSE 2-4 GM/100ML-% IV SOLN
2.0000 g | Freq: Four times a day (QID) | INTRAVENOUS | Status: AC
  Administered 2016-10-31 – 2016-11-01 (×2): 2 g via INTRAVENOUS
  Filled 2016-10-31 (×2): qty 100

## 2016-10-31 MED ORDER — METOCLOPRAMIDE HCL 5 MG/ML IJ SOLN
5.0000 mg | Freq: Three times a day (TID) | INTRAMUSCULAR | Status: DC | PRN
Start: 2016-10-31 — End: 2016-11-02

## 2016-10-31 MED ORDER — TIMOLOL MALEATE 0.5 % OP SOLN
1.0000 [drp] | Freq: Two times a day (BID) | OPHTHALMIC | Status: DC
  Administered 2016-10-31 – 2016-11-02 (×4): 1 [drp] via OPHTHALMIC
  Filled 2016-10-31: qty 5

## 2016-10-31 MED ORDER — STERILE WATER FOR IRRIGATION IR SOLN
Status: DC | PRN
  Administered 2016-10-31: 2000 mL

## 2016-10-31 MED ORDER — DIPHENHYDRAMINE HCL 12.5 MG/5ML PO ELIX: 12.5000 mg | ORAL_SOLUTION | ORAL | Status: DC | PRN

## 2016-10-31 MED ORDER — OXYCODONE HCL 5 MG PO TABS
5.0000 mg | ORAL_TABLET | ORAL | Status: DC | PRN
  Administered 2016-10-31 (×2): 5 mg via ORAL
  Administered 2016-10-31 – 2016-11-02 (×8): 10 mg via ORAL
  Filled 2016-10-31 (×4): qty 2
  Filled 2016-10-31 (×2): qty 1
  Filled 2016-10-31 (×4): qty 2

## 2016-10-31 MED ORDER — LACTATED RINGERS IV SOLN
INTRAVENOUS | Status: DC
  Administered 2016-10-31: 16:00:00 via INTRAVENOUS
  Administered 2016-10-31: 1000 mL via INTRAVENOUS

## 2016-10-31 MED ORDER — PHENYLEPHRINE HCL 10 MG/ML IJ SOLN
INTRAMUSCULAR | Status: DC | PRN
  Administered 2016-10-31: 80 ug via INTRAVENOUS

## 2016-10-31 MED ORDER — ACETAMINOPHEN 325 MG PO TABS: 650.0000 mg | ORAL_TABLET | Freq: Four times a day (QID) | ORAL | Status: DC | PRN

## 2016-10-31 MED ORDER — BUPIVACAINE HCL (PF) 0.25 % IJ SOLN
INTRAMUSCULAR | Status: AC
  Filled 2016-10-31: qty 30

## 2016-10-31 SURGICAL SUPPLY — 37 items
BAG ZIPLOCK 12X15 (MISCELLANEOUS) ×3 IMPLANT
BLADE SAG 18X100X1.27 (BLADE) ×3 IMPLANT
CAPT HIP TOTAL 2 ×3 IMPLANT
CLOSURE WOUND 1/2 X4 (GAUZE/BANDAGES/DRESSINGS) ×1
CLOTH BEACON ORANGE TIMEOUT ST (SAFETY) ×3 IMPLANT
COVER PERINEAL POST (MISCELLANEOUS) ×3 IMPLANT
DECANTER SPIKE VIAL GLASS SM (MISCELLANEOUS) ×3 IMPLANT
DRAPE STERI IOBAN 125X83 (DRAPES) ×3 IMPLANT
DRAPE U-SHAPE 47X51 STRL (DRAPES) ×6 IMPLANT
DRSG ADAPTIC 3X8 NADH LF (GAUZE/BANDAGES/DRESSINGS) ×3 IMPLANT
DRSG MEPILEX BORDER 4X4 (GAUZE/BANDAGES/DRESSINGS) ×3 IMPLANT
DRSG MEPILEX BORDER 4X8 (GAUZE/BANDAGES/DRESSINGS) ×3 IMPLANT
DURAPREP 26ML APPLICATOR (WOUND CARE) ×3 IMPLANT
ELECT REM PT RETURN 9FT ADLT (ELECTROSURGICAL) ×3
ELECTRODE REM PT RTRN 9FT ADLT (ELECTROSURGICAL) ×1 IMPLANT
EVACUATOR 1/8 PVC DRAIN (DRAIN) ×3 IMPLANT
GLOVE BIO SURGEON STRL SZ7.5 (GLOVE) ×3 IMPLANT
GLOVE BIO SURGEON STRL SZ8 (GLOVE) ×6 IMPLANT
GLOVE BIOGEL PI IND STRL 7.0 (GLOVE) ×2 IMPLANT
GLOVE BIOGEL PI IND STRL 7.5 (GLOVE) ×1 IMPLANT
GLOVE BIOGEL PI IND STRL 8 (GLOVE) ×2 IMPLANT
GLOVE BIOGEL PI INDICATOR 7.0 (GLOVE) ×4
GLOVE BIOGEL PI INDICATOR 7.5 (GLOVE) ×2
GLOVE BIOGEL PI INDICATOR 8 (GLOVE) ×4
GLOVE SURG SS PI 7.5 STRL IVOR (GLOVE) ×9 IMPLANT
GOWN STRL REUS W/ TWL XL LVL3 (GOWN DISPOSABLE) ×1 IMPLANT
GOWN STRL REUS W/TWL LRG LVL3 (GOWN DISPOSABLE) ×3 IMPLANT
GOWN STRL REUS W/TWL XL LVL3 (GOWN DISPOSABLE) ×8 IMPLANT
PACK ANTERIOR HIP CUSTOM (KITS) ×3 IMPLANT
STRIP CLOSURE SKIN 1/2X4 (GAUZE/BANDAGES/DRESSINGS) ×2 IMPLANT
SUT ETHIBOND NAB CT1 #1 30IN (SUTURE) ×3 IMPLANT
SUT MNCRL AB 4-0 PS2 18 (SUTURE) ×3 IMPLANT
SUT VIC AB 2-0 CT1 27 (SUTURE) ×4
SUT VIC AB 2-0 CT1 TAPERPNT 27 (SUTURE) ×2 IMPLANT
SUT VLOC 180 0 24IN GS25 (SUTURE) ×3 IMPLANT
TRAY FOLEY W/METER SILVER 16FR (SET/KITS/TRAYS/PACK) ×3 IMPLANT
YANKAUER SUCT BULB TIP 10FT TU (MISCELLANEOUS) ×3 IMPLANT

## 2016-10-31 NOTE — Anesthesia Preprocedure Evaluation (Addendum)
Anesthesia Evaluation  Patient identified by MRN, date of birth, ID band Patient awake    Reviewed: Allergy & Precautions, NPO status , Patient's Chart, lab work & pertinent test results  Airway Mallampati: III  TM Distance: >3 FB Neck ROM: Full    Dental no notable dental hx. (+) Teeth Intact   Pulmonary former smoker,    Pulmonary exam normal breath sounds clear to auscultation       Cardiovascular hypertension, Pt. on medications Normal cardiovascular exam Rhythm:Regular Rate:Normal     Neuro/Psych Glaucoma negative neurological ROS  negative psych ROS   GI/Hepatic negative GI ROS, Neg liver ROS,   Endo/Other    Renal/GU negative Renal ROS   BPH ED    Musculoskeletal  (+) Arthritis , Osteoarthritis,  OA left hip   Abdominal (+) - obese,   Peds  Hematology negative hematology ROS (+)   Anesthesia Other Findings   Reproductive/Obstetrics                              Chemistry   No results found for: NA, K, CL, CO2, BUN, CREATININE, GLU    Component Value Date/Time   ALKPHOS 44 10/23/2016 0843   AST 16 10/23/2016 0843   ALT 14 (L) 10/23/2016 0843   BILITOT 1.2 10/23/2016 0843      Anesthesia Physical Anesthesia Plan  ASA: II  Anesthesia Plan: Spinal   Post-op Pain Management:    Induction:   Airway Management Planned: Natural Airway and Nasal Cannula  Additional Equipment:   Intra-op Plan:   Post-operative Plan:   Informed Consent: I have reviewed the patients History and Physical, chart, labs and discussed the procedure including the risks, benefits and alternatives for the proposed anesthesia with the patient or authorized representative who has indicated his/her understanding and acceptance.   Dental advisory given  Plan Discussed with: CRNA, Anesthesiologist and Surgeon  Anesthesia Plan Comments:         Anesthesia Quick Evaluation

## 2016-10-31 NOTE — Interval H&P Note (Signed)
History and Physical Interval Note:  10/31/2016 2:29 PM  Samuel Porter  has presented today for surgery, with the diagnosis of LEFT HIP OA  The various methods of treatment have been discussed with the patient and family. After consideration of risks, benefits and other options for treatment, the patient has consented to  Procedure(s): LEFT TOTAL HIP ARTHROPLASTY ANTERIOR APPROACH (Left) as a surgical intervention .  The patient's history has been reviewed, patient examined, no change in status, stable for surgery.  I have reviewed the patient's chart and labs.  Questions were answered to the patient's satisfaction.     Gearlean Alf

## 2016-10-31 NOTE — Op Note (Signed)
OPERATIVE REPORT- TOTAL HIP ARTHROPLASTY   PREOPERATIVE DIAGNOSIS: Osteoarthritis of the Left hip.   POSTOPERATIVE DIAGNOSIS: Osteoarthritis of the Left  hip.   PROCEDURE: Left total hip arthroplasty, anterior approach.   SURGEON: Gaynelle Arabian, MD   ASSISTANT: Arlee Muslim, PA-C  ANESTHESIA:  Spinal  ESTIMATED BLOOD LOSS:-200 ml    DRAINS: Hemovac x1.   COMPLICATIONS: None   CONDITION: PACU - hemodynamically stable.   BRIEF CLINICAL NOTE: Samuel Porter is a 76 y.o. male who has advanced end-  stage arthritis of their Left  hip with progressively worsening pain and  dysfunction.The patient has failed nonoperative management and presents for  total hip arthroplasty.   PROCEDURE IN DETAIL: After successful administration of spinal  anesthetic, the traction boots for the [facility] bed were placed on both  feet and the patient was placed onto the [facility] bed, boots placed into the leg  holders. The Left hip was then isolated from the perineum with plastic  drapes and prepped and draped in the usual sterile fashion. ASIS and  greater trochanter were marked and a oblique incision was made, starting  at about 1 cm lateral and 2 cm distal to the ASIS and coursing towards  the anterior cortex of the femur. The skin was cut with a 10 blade  through subcutaneous tissue to the level of the fascia overlying the  tensor fascia lata muscle. The fascia was then incised in line with the  incision at the junction of the anterior third and posterior 2/3rd. The  muscle was teased off the fascia and then the interval between the TFL  and the rectus was developed. The Hohmann retractor was then placed at  the top of the femoral neck over the capsule. The vessels overlying the  capsule were cauterized and the fat on top of the capsule was removed.  A Hohmann retractor was then placed anterior underneath the rectus  femoris to give exposure to the entire anterior capsule. A T-shaped   capsulotomy was performed. The edges were tagged and the femoral head  was identified.       Osteophytes are removed off the superior acetabulum.  The femoral neck was then cut in situ with an oscillating saw. Traction  was then applied to the left lower extremity utilizing the [facility]  traction. The femoral head was then removed. Retractors were placed  around the acetabulum and then circumferential removal of the labrum was  performed. Osteophytes were also removed. Reaming starts at 47 mm to  medialize and  Increased in 2 mm increments to 51 mm. We reamed in  approximately 40 degrees of abduction, 20 degrees anteversion. A 52 mm  pinnacle acetabular shell was then impacted in anatomic position under  fluoroscopic guidance with excellent purchase. We did not need to place  any additional dome screws. A 32 mm neutral + 4 marathon liner was then  placed into the acetabular shell.       The femoral lift was then placed along the lateral aspect of the femur  just distal to the vastus ridge. The leg was  externally rotated and capsule  was stripped off the inferior aspect of the femoral neck down to the  level of the lesser trochanter, this was done with electrocautery. The femur was lifted after this was performed. The  leg was then placed in an extended and adducted position essentially delivering the femur. We also removed the capsule superiorly and the piriformis from the piriformis  fossa to gain excellent exposure of the  proximal femur. Rongeur was used to remove some cancellous bone to get  into the lateral portion of the proximal femur for placement of the  initial starter reamer. The starter broaches was placed  the starter broach  and was shown to go down the center of the canal. Broaching  with the  Corail system was then performed starting at size 8, coursing  Up to size 13. A size 13 had excellent torsional and rotational  and axial stability. The trial high offset neck was then  placed  with a 32 + 1 trial head. The hip was then reduced. We confirmed that  the stem was in the canal both on AP and lateral x-rays. It also has excellent sizing. The hip was reduced with outstanding stability through full extension and full external rotation.. AP pelvis was taken and the leg lengths were measured and found to be equal. Hip was then dislocated again and the femoral head and neck removed. The  femoral broach was removed. Size 13 Corail stem with a high offset  neck was then impacted into the femur following native anteversion. Has  excellent purchase in the canal. Excellent torsional and rotational and  axial stability. It is confirmed to be in the canal on AP and lateral  fluoroscopic views. The 32 + 1 ceramic head was placed and the hip  reduced with outstanding stability. Again AP pelvis was taken and it  confirmed that the leg lengths were equal. The wound was then copiously  irrigated with saline solution and the capsule reattached and repaired  with Ethibond suture. 30 ml of .25% Bupivicaine was  injected into the capsule and into the edge of the tensor fascia lata as well as subcutaneous tissue. The fascia overlying the tensor fascia lata was then closed with a running #1 V-Loc. Subcu was closed with interrupted 2-0 Vicryl and subcuticular running 4-0 Monocryl. Incision was cleaned  and dried. Steri-Strips and a bulky sterile dressing applied. Hemovac  drain was hooked to suction and then the patient was awakened and transported to  recovery in stable condition.        Please note that a surgical assistant was a medical necessity for this procedure to perform it in a safe and expeditious manner. Assistant was necessary to provide appropriate retraction of vital neurovascular structures and to prevent femoral fracture and allow for anatomic placement of the prosthesis.  Gaynelle Arabian, M.D.

## 2016-10-31 NOTE — H&P (View-Only) (Signed)
Rance Muir DOB: [birth year] Married / Language: English / Race: White Male Date of Admission:  10/31/2016  CC:  Left Hip Pain History of Present Illness  The patient is a 76 year old male who comes in for a preoperative History and Physical. The patient is scheduled for a left total hip arthroplasty (anterior) to be performed by Dr. Dione Plover. [name], MD at [facility] on 10-31-2016. The patient is a 77 year old male who presented for follow up of their hip. The patient is being followed for their left hip pain and osteoarthritis. They are months out from intra-articular injection (left hip). Symptoms reported include: pain, pain at night, aching and difficulty ambulating. The patient feels that they are doing well and report their pain level to be moderate to severe. The following medication has been used for pain control: aspirin (prn). The patient has reported improvement of their symptoms with: Cortisone injections. Mr. Simer's hip unfortunately has gotten much worse. We replaced his right hip several years ago, he did great with that. It was done via posterior approach. The left hip is hurting him at all times, it is bad as the right hip was prior to when he had surgery on it. He said that it is definitely limiting what he can and cannot do. Cortisone injection helped to a certain degree, but has not allowed him to be more active. He is ready to proceed with surgery on the left hip at this time. They have been treated conservatively in the past for the above stated problem and despite conservative measures, they continue to have progressive pain and severe functional limitations and dysfunction. They have failed non-operative management including home exercise, medications, and injections. It is felt that they would benefit from undergoing total joint replacement. Risks and benefits of the procedure have been discussed with the patient and they elect to proceed with surgery. There are no  active contraindications to surgery such as ongoing infection or rapidly progressive neurological disease.  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)  Primary osteoarthritis of left hip (M16.12)  High blood pressure  Skin Cancer  Glaucoma  Enlarged prostate   Allergies No Known Drug Allergies   Family History Cancer  mother, father and sister Heart Disease  brother  Social History Alcohol use  current drinker; drinks beer and wine; 5-7 per week Children  4 Current work status  retired Engineer, agricultural (Currently)  no Drug/Alcohol Rehab (Previously)  no Exercise  Exercises weekly; does other Illicit drug use  no Living situation  live with spouse Marital status  married Number of flights of stairs before winded  4-5 Pain Contract  no Tobacco / smoke exposure  no Tobacco use  former smoker; smoke(d) 1 pack(s) per day Advance Directives  Living Will, Healthcare POA  Medication History AmLODIPine Besylate (5MG  Tablet, Oral) Active. Cosopt (Ophthalmic) Specific strength unknown - Active. Xalatan (Ophthalmic) Specific strength unknown - Active. Finasteride (5MG  Tablet, Oral) Active. ASA Arthritis Strength/Antacid (Oral) Specific strength unknown - Active. Vit D-Vit E-Safflower Oil (External) Active.  Past Surgical History Colon Polyp Removal - Colonoscopy  Gallbladder Surgery  open Tonsillectomy  Total Hip Replacement  right Mohs Procedure    Review of Systems  General Not Present- Chills, Fatigue, Fever, Memory Loss, Night Sweats, Weight Gain and Weight Loss. Skin Not Present- Eczema, Hives, Itching, Lesions and Rash. HEENT Not Present- Dentures, Double Vision, Headache, Hearing Loss, Tinnitus and Visual Loss. Respiratory Not Present- Allergies, Chronic Cough, Coughing  up blood, Shortness of breath at rest and Shortness of breath with exertion. Cardiovascular Not Present- Chest Pain, Difficulty Breathing  Lying Down, Murmur, Palpitations, Racing/skipping heartbeats and Swelling. Gastrointestinal Not Present- Abdominal Pain, Bloody Stool, Constipation, Diarrhea, Difficulty Swallowing, Heartburn, Jaundice, Loss of appetitie, Nausea and Vomiting. Male Genitourinary Not Present- Blood in Urine, Discharge, Flank Pain, Incontinence, Painful Urination, Urgency, Urinary frequency, Urinary Retention, Urinating at Night and Weak urinary stream. Musculoskeletal Present- Joint Pain and Morning Stiffness. Not Present- Back Pain, Joint Swelling, Muscle Pain, Muscle Weakness and Spasms. Neurological Not Present- Blackout spells, Difficulty with balance, Dizziness, Paralysis, Tremor and Weakness. Psychiatric Not Present- Insomnia.  Vitals Weight: 215 lb Height: 74in Weight was reported by patient. Height was reported by patient. Body Surface Area: 2.24 m Body Mass Index: 27.6 kg/m  Pulse: 56 (Regular)  BP: 132/70 (Sitting, Left Arm, Standard)  Physical Exam General Mental Status -Alert, cooperative and good historian. General Appearance-pleasant, Not in acute distress. Orientation-Oriented X3. Build & Nutrition-Well nourished and Well developed.  Head and Neck Head-normocephalic, atraumatic . Neck Global Assessment - supple, no bruit auscultated on the right, no bruit auscultated on the left.  Eye Pupil - Bilateral-Regular and Round. Motion - Bilateral-EOMI.  Chest and Lung Exam Auscultation Breath sounds - clear at anterior chest wall and clear at posterior chest wall. Adventitious sounds - No Adventitious sounds.  Cardiovascular Auscultation Rhythm - Regular rate and rhythm. Heart Sounds - S1 WNL and S2 WNL. Murmurs & Other Heart Sounds - Auscultation of the heart reveals - No Murmurs.  Abdomen Palpation/Percussion Tenderness - Abdomen is non-tender to palpation. Rigidity (guarding) - Abdomen is soft. Auscultation Auscultation of the abdomen reveals - Bowel sounds  normal.  Male Genitourinary Note: Not done, not pertinent to present illness   Musculoskeletal Note: On exam, he is in no distress. His left hip can be flexed to about 90 with no internal rotation about 10 degrees of external rotation, 20 degrees abduction. Right hip flexion 120, rotation in 30 out 40, abduct 40 without discomfort.  IMAGING We reviewed his radiographs, AP pelvis and lateral of the left hip, he has got severe bone on bone arthritis of the left hip with subchondral cystic formation.  Assessment & Plan  Primary osteoarthritis of left hip (M16.12)  Note:Surgical Plans: Left Total Hip Replacement - Anterior Approach  Disposition: Home  PCP: Dr. Orland Mustard [facility]  IV TXA  Anesthesia Issues: None  Signed electronically by Ok Edwards, III PA-C

## 2016-10-31 NOTE — Transfer of Care (Signed)
Immediate Anesthesia Transfer of Care Note  Patient: Samuel Porter  Procedure(s) Performed: Procedure(s): LEFT TOTAL HIP ARTHROPLASTY ANTERIOR APPROACH (Left)  Patient Location: PACU  Anesthesia Type:Spinal  Level of Consciousness: sedated, patient cooperative and responds to stimulation  Airway & Oxygen Therapy: Patient Spontanous Breathing and Patient connected to face mask oxygen  Post-op Assessment: Report given to RN and Post -op Vital signs reviewed and stable  Post vital signs: Reviewed and stable  Last Vitals:  Vitals:   10/31/16 0937  BP: (!) 152/85  Pulse: 60  Resp: 18  Temp: 36.7 C    Last Pain:  Vitals:   10/31/16 1315  TempSrc:   PainSc: 3       Patients Stated Pain Goal: 4 ([id number])  Complications: No apparent anesthesia complications

## 2016-10-31 NOTE — Anesthesia Procedure Notes (Signed)
Spinal  Start time: 10/31/2016 2:48 PM End time: 10/31/2016 2:52 PM Staffing Resident/CRNA: Gean Maidens Performed: resident/CRNA  Preanesthetic Checklist Completed: patient identified, site marked, surgical consent, pre-op evaluation, timeout performed, IV checked, risks and benefits discussed and monitors and equipment checked Spinal Block Patient position: sitting Prep: Betadine Patient monitoring: heart rate, continuous pulse ox and blood pressure Approach: midline Location: L4-5 Injection technique: single-shot Needle Needle type: Sprotte  Needle gauge: 24 G Needle length: 9 cm Needle insertion depth: 8 cm Additional Notes Pt sitting position, monitors and O2 applied. Sterile prep and drape, negative paresthesia, negative heme.

## 2016-11-01 ENCOUNTER — Encounter (HOSPITAL_COMMUNITY): Payer: Self-pay | Admitting: Orthopedic Surgery

## 2016-11-01 LAB — CBC
HCT: 40.6 % (ref 39.0–52.0)
Hemoglobin: 13.8 g/dL (ref 13.0–17.0)
MCH: 29.6 pg (ref 26.0–34.0)
MCHC: 34 g/dL (ref 30.0–36.0)
MCV: 86.9 fL (ref 78.0–100.0)
PLATELETS: 136 10*3/uL — AB (ref 150–400)
RBC: 4.67 MIL/uL (ref 4.22–5.81)
RDW: 13.1 % (ref 11.5–15.5)
WBC: 9.4 10*3/uL (ref 4.0–10.5)

## 2016-11-01 LAB — BASIC METABOLIC PANEL
Anion gap: 6 (ref 5–15)
BUN: 13 mg/dL (ref 6–20)
CALCIUM: 8.5 mg/dL — AB (ref 8.9–10.3)
CO2: 25 mmol/L (ref 22–32)
CREATININE: 0.88 mg/dL (ref 0.61–1.24)
Chloride: 107 mmol/L (ref 101–111)
GFR calc Af Amer: >60 mL/min (ref >60)
GLUCOSE: 158 mg/dL — AB (ref 65–99)
Potassium: 4.1 mmol/L (ref 3.5–5.1)
Sodium: 138 mmol/L (ref 135–145)

## 2016-11-01 MED ORDER — METHOCARBAMOL 500 MG PO TABS: 500.0000 mg | ORAL_TABLET | Freq: Four times a day (QID) | ORAL | 0 refills | Status: DC | PRN

## 2016-11-01 MED ORDER — OXYCODONE HCL 5 MG PO TABS: 5.0000 mg | ORAL_TABLET | ORAL | 0 refills | Status: DC | PRN

## 2016-11-01 MED ORDER — RIVAROXABAN 10 MG PO TABS: 10.0000 mg | ORAL_TABLET | Freq: Every day | ORAL | 0 refills | Status: DC

## 2016-11-01 MED ORDER — TRAMADOL HCL 50 MG PO TABS: 50.0000 mg | ORAL_TABLET | Freq: Four times a day (QID) | ORAL | 0 refills | Status: DC | PRN

## 2016-11-01 NOTE — Discharge Instructions (Addendum)
° °Dr. Frank [name] °Total Joint Specialist °[facility] °[address] °[place], [place] [address] °[phone] ° °ANTERIOR APPROACH TOTAL HIP REPLACEMENT POSTOPERATIVE DIRECTIONS ° ° °Hip Rehabilitation, Guidelines Following Surgery  °The results of a hip operation are greatly improved after range of motion and muscle strengthening exercises. Follow all safety measures which are given to protect your hip. If any of these exercises cause increased pain or swelling in your joint, decrease the amount until you are comfortable again. Then slowly increase the exercises. Call your caregiver if you have problems or questions.  ° °HOME CARE INSTRUCTIONS  °Remove items at home which could result in a fall. This includes throw rugs or furniture in walking pathways.  °· ICE to the affected hip every three hours for 30 minutes at a time and then as needed for pain and swelling.  Continue to use ice on the hip for pain and swelling from surgery. You may notice swelling that will progress down to the foot and ankle.  This is normal after surgery.  Elevate the leg when you are not up walking on it.   °· Continue to use the breathing machine which will help keep your temperature down.  It is common for your temperature to cycle up and down following surgery, especially at night when you are not up moving around and exerting yourself.  The breathing machine keeps your lungs expanded and your temperature down. ° ° °DIET °You may resume your previous home diet once your are discharged from the hospital. ° °DRESSING / WOUND CARE / SHOWERING °You may shower 3 days after surgery, but keep the wounds dry during showering.  You may use an occlusive plastic wrap (Press'n Seal for example), NO SOAKING/SUBMERGING IN THE BATHTUB.  If the bandage gets wet, change with a clean dry gauze.  If the incision gets wet, pat the wound dry with a clean towel. °You may start showering once you are discharged home but do not  submerge the incision under water. Just pat the incision dry and apply a dry gauze dressing on daily. °Change the surgical dressing daily and reapply a dry dressing each time. ° °ACTIVITY °Walk with your walker as instructed. °Use walker as long as suggested by your caregivers. °Avoid periods of inactivity such as sitting longer than an hour when not asleep. This helps prevent blood clots.  °You may resume a sexual relationship in one month or when given the OK by your doctor.  °You may return to work once you are cleared by your doctor.  °Do not drive a car for 6 weeks or until released by you surgeon.  °Do not drive while taking narcotics. ° °WEIGHT BEARING °Weight bearing as tolerated with assist device (walker, cane, etc) as directed, use it as long as suggested by your surgeon or therapist, typically at least 4-6 weeks. ° °POSTOPERATIVE CONSTIPATION PROTOCOL °Constipation - defined medically as fewer than three stools per week and severe constipation as less than one stool per week. ° °One of the most common issues patients have following surgery is constipation.  Even if you have a regular bowel pattern at home, your normal regimen is likely to be disrupted due to multiple reasons following surgery.  Combination of anesthesia, postoperative narcotics, change in appetite and fluid intake all can affect your bowels.  In order to avoid complications following surgery, here are some recommendations in order to help you during your recovery period. ° °Colace (docusate) - Pick up an over-the-counter   form of Colace or another stool softener and take twice a day as long as you are requiring postoperative pain medications.  Take with a full glass of water daily.  If you experience loose stools or diarrhea, hold the colace until you stool forms back up.  If your symptoms do not get better within 1 week or if they get worse, check with your doctor. ° °Dulcolax (bisacodyl) - Pick up over-the-counter and take as directed  by the product packaging as needed to assist with the movement of your bowels.  Take with a full glass of water.  Use this product as needed if not relieved by Colace only.  ° °MiraLax (polyethylene glycol) - Pick up over-the-counter to have on hand.  MiraLax is a solution that will increase the amount of water in your bowels to assist with bowel movements.  Take as directed and can mix with a glass of water, juice, soda, coffee, or tea.  Take if you go more than two days without a movement. °Do not use MiraLax more than once per day. Call your doctor if you are still constipated or irregular after using this medication for 7 days in a row. ° °If you continue to have problems with postoperative constipation, please contact the office for further assistance and recommendations.  If you experience "the worst abdominal pain ever" or develop nausea or vomiting, please contact the office immediatly for further recommendations for treatment. ° °ITCHING ° If you experience itching with your medications, try taking only a single pain pill, or even half a pain pill at a time.  You can also use Benadryl over the counter for itching or also to help with sleep.  ° °TED HOSE STOCKINGS °Wear the elastic stockings on both legs for three weeks following surgery during the day but you may remove then at night for sleeping. ° °MEDICATIONS °See your medication summary on the “After Visit Summary” that the nursing staff will review with you prior to discharge.  You may have some home medications which will be placed on hold until you complete the course of blood thinner medication.  It is important for you to complete the blood thinner medication as prescribed by your surgeon.  Continue your approved medications as instructed at time of discharge. ° °PRECAUTIONS °If you experience chest pain or shortness of breath - call 911 immediately for transfer to the hospital emergency department.  °If you develop a fever greater that 101 F,  purulent drainage from wound, increased redness or drainage from wound, foul odor from the wound/dressing, or calf pain - CONTACT YOUR SURGEON.   °                                                °FOLLOW-UP APPOINTMENTS °Make sure you keep all of your appointments after your operation with your surgeon and caregivers. You should call the office at the above phone number and make an appointment for approximately two weeks after the date of your surgery or on the date instructed by your surgeon outlined in the "After Visit Summary". ° °RANGE OF MOTION AND STRENGTHENING EXERCISES  °These exercises are designed to help you keep full movement of your hip joint. Follow your caregiver's or physical therapist's instructions. Perform all exercises about fifteen times, three times per day or as directed. Exercise both hips, even if you   have had only one joint replacement. These exercises can be done on a training (exercise) mat, on the floor, on a table or on a bed. Use whatever works the best and is most comfortable for you. Use music or television while you are exercising so that the exercises are a pleasant break in your day. This will make your life better with the exercises acting as a break in routine you can look forward to.  Lying on your back, slowly slide your foot toward your buttocks, raising your knee up off the floor. Then slowly slide your foot back down until your leg is straight again.  Lying on your back spread your legs as far apart as you can without causing discomfort.  Lying on your side, raise your upper leg and foot straight up from the floor as far as is comfortable. Slowly lower the leg and repeat.  Lying on your back, tighten up the muscle in the front of your thigh (quadriceps muscles). You can do this by keeping your leg straight and trying to raise your heel off the floor. This helps strengthen the largest muscle supporting your knee.  Lying on your back, tighten up the muscles of your  buttocks both with the legs straight and with the knee bent at a comfortable angle while keeping your heel on the floor.   IF YOU ARE TRANSFERRED TO A SKILLED REHAB FACILITY If the patient is transferred to a skilled rehab facility following release from the hospital, a list of the current medications will be sent to the facility for the patient to continue.  When discharged from the skilled rehab facility, please have the facility set up the patient's [place] prior to being released. Also, the skilled facility will be responsible for providing the patient with their medications at time of release from the facility to include their pain medication, the muscle relaxants, and their blood thinner medication. If the patient is still at the rehab facility at time of the two week follow up appointment, the skilled rehab facility will also need to assist the patient in arranging follow up appointment in our office and any transportation needs.  MAKE SURE YOU:  Understand these instructions.  Get help right away if you are not doing well or get worse.    Pick up stool softner and laxative for home use following surgery while on pain medications. Do not submerge incision under water. Please use good hand washing techniques while changing dressing each day. May shower starting three days after surgery. Please use a clean towel to pat the incision dry following showers. Continue to use ice for pain and swelling after surgery. Do not use any lotions or creams on the incision until instructed by your surgeon.  Take Xarelto for two and a half more weeks following discharge from the hospital, then discontinue Xarelto. Once the patient has completed the Xarelto, they may resume the 81 mg Aspirin.  Information on my medicine - XARELTO (Rivaroxaban)  This medication education was reviewed with me or my healthcare representative as part of my discharge preparation.  The pharmacist that  spoke with me during my hospital stay was:  Modena Morrow, PharmD Candidate  Why was Xarelto prescribed for you? Xarelto was prescribed for you to reduce the risk of blood clots forming after orthopedic surgery. The medical term for these abnormal blood clots is venous thromboembolism (VTE).  What do you need to know about xarelto ? Take your Xarelto ONCE DAILY at  the same time every day. You may take it either with or without food.  If you have difficulty swallowing the tablet whole, you may crush it and mix in applesauce just prior to taking your dose.  Take Xarelto exactly as prescribed by your doctor and DO NOT stop taking Xarelto without talking to the doctor who prescribed the medication.  Stopping without other VTE prevention medication to take the place of Xarelto may increase your risk of developing a clot.  After discharge, you should have regular check-up appointments with your healthcare provider that is prescribing your Xarelto.    What do you do if you miss a dose? If you miss a dose, take it as soon as you remember on the same day then continue your regularly scheduled once daily regimen the next day. Do not take two doses of Xarelto on the same day.   Important Safety Information A possible side effect of Xarelto is bleeding. You should call your healthcare provider right away if you experience any of the following: ? Bleeding from an injury or your nose that does not stop. ? Unusual colored urine (red or dark brown) or unusual colored stools (red or black). ? Unusual bruising for unknown reasons. ? A serious fall or if you hit your head (even if there is no bleeding).  Some medicines may interact with Xarelto and might increase your risk of bleeding while on Xarelto. To help avoid this, consult your healthcare provider or pharmacist prior to using any new prescription or non-prescription medications, including herbals, vitamins, non-steroidal anti-inflammatory  drugs (NSAIDs) and supplements.  This website has more information on Xarelto: [email or web address]

## 2016-11-01 NOTE — Progress Notes (Signed)
   Subjective: 1 Day Post-Op Procedure(s) (LRB): LEFT TOTAL HIP ARTHROPLASTY ANTERIOR APPROACH (Left) Patient reports pain as mild.   We will start therapy today.  Plan is to go Home after hospital stay.  Objective: Vital signs in last 24 hours: Temp:  [97.6 F (36.4 C)-98.1 F (36.7 C)] 97.9 F (36.6 C) (01/18 0618) Pulse Rate:  [51-77] 65 (01/18 0618) Resp:  [14-21] 15 (01/18 0618) BP: (97-152)/(65-85) 130/72 (01/18 0618) SpO2:  [96 %-100 %] 96 % (01/18 0618) Weight:  [102.1 kg (225 lb)] 102.1 kg (225 lb) (01/17 1754)  Intake/Output from previous day:  Intake/Output Summary (Last 24 hours) at 11/01/16 0737 Last data filed at 11/01/16 0305  Gross per 24 hour  Intake          3088.33 ml  Output             1645 ml  Net          1443.33 ml    Intake/Output this shift: No intake/output data recorded.  Labs:  Recent Labs  11/01/16 0430  HGB 13.8    Recent Labs  11/01/16 0430  WBC 9.4  RBC 4.67  HCT 40.6  PLT 136*    Recent Labs  11/01/16 0430  NA 138  K 4.1  CL 107  CO2 25  BUN 13  CREATININE 0.88  GLUCOSE 158*  CALCIUM 8.5*   No results for input(s): LABPT, INR in the last 72 hours.  EXAM General - Patient is Alert, Appropriate and Oriented Extremity - Neurologically intact Neurovascular intact No cellulitis present Compartment soft Dressing - dressing C/D/I Motor Function - intact, moving foot and toes well on exam.  Hemovac pulled without difficulty.  Past Medical History:  Diagnosis Date  . Arthritis    oa  . BPH (benign prostatic hyperplasia)   . 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 Cataracts, bilateral   . Glaucoma   . Hypertension   . Vitamin D deficiency     Assessment/Plan: 1 Day Post-Op Procedure(s) (LRB): LEFT TOTAL HIP ARTHROPLASTY ANTERIOR APPROACH (Left) Principal Problem:   OA (osteoarthritis) of hip   Advance diet Up with therapy D/C IV fluids Plan for discharge tomorrow  DVT Prophylaxis  - Xarelto Weight Bearing As Tolerated left Leg Hemovac Pulled Begin Therapy  Gearlean Alf
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
## 2016-11-01 NOTE — Care Management Note (Signed)
Case Management Note  Patient Details  Name: Samuel Porter MRN: [id number] Date of Birth: 05/07/1941  Subjective/Objective:                  s/p L DA THA Action/Plan: Discharge planning Expected Discharge Date:  11/01/16               Expected Discharge Plan:  [place]  In-House Referral:     Discharge planning Services  CM Consult  Post Acute Care Choice:  Home Health Choice offered to:  Patient  DME Arranged:  N/A DME Agency:  NA  HH Arranged:  PT [place] Agency:  [facility] ([facility]  Status of Service:  Completed, signed off  If discussed at [employer], dates discussed:    Additional Comments: CM met with pt in room to offer choice of home health agency. Pt chooses [facility] to render HHPT. Referral given to [facility] rep, tim. Pt states he has all the DME needed at home. No other CM needs were communicated. Dellie Catholic, RN 11/01/2016, 1:25 PM

## 2016-11-01 NOTE — Progress Notes (Signed)
Physical Therapy Treatment [name] MRN: [id number] DOB: 1940-11-20 Today's Date: 11/01/2016    History of Present Illness s/p L DA THA, h/o R THR posterior approach 10+ years ago    PT Comments    Pt progressing well with mobility.  Pt spouse and dtr present and reviewed stair into home and car transfers.  Follow Up Recommendations  Home health PT     Equipment Recommendations  None recommended by PT    Recommendations for Other Services OT consult     Precautions / Restrictions Precautions Precautions: Fall Restrictions Weight Bearing Restrictions: No Other Position/Activity Restrictions: WBAT    Mobility  Bed Mobility Overal bed mobility: Needs Assistance Bed Mobility: Supine to Sit     Supine to sit: Min guard     General bed mobility comments: cues for sequence andand use of R LE to self assist  Transfers Overall transfer level: Needs assistance Equipment used: Rolling walker (2 wheeled) Transfers: Sit to/from Stand Sit to Stand: Min guard         General transfer comment: cues for LE management and use of UEs to self assist  Ambulation/Gait Ambulation/Gait assistance: Min guard Ambulation Distance (Feet): 400 Feet Assistive device: Rolling walker (2 wheeled) Gait Pattern/deviations: Step-to pattern;Step-through pattern;Decreased step length - right;Decreased step length - left;Shuffle;Trunk flexed Gait velocity: decr Gait velocity interpretation: Below normal speed for age/gender General Gait Details: cues for posture, position from RW and initial sequence   Stairs Stairs: Yes   Stair Management: No rails;Step to pattern;Forwards;With walker Number of Stairs: 2 General stair comments: Single step twice with RW and cues for sequence and foot/RW placement.  Wheelchair Mobility    Modified Rankin (Stroke Patients Only)       Balance                                    Cognition  Arousal/Alertness: Awake/alert Behavior During Therapy: WFL for tasks assessed/performed Overall Cognitive Status: Within Functional Limits for tasks assessed                      Exercises      General Comments        Pertinent Vitals/Pain Pain Assessment: 0-10 Pain Score: 3  Pain Location: L hip Pain Descriptors / Indicators: Aching;Sore Pain Intervention(s): Limited activity within patient's tolerance;Monitored during session;Premedicated before session;Ice applied    Home Living                      Prior Function            PT Goals (current goals can now be found in the care plan section) Acute Rehab PT Goals Patient Stated Goal: home PT Goal Formulation: With patient Potential to Achieve Goals: Good Progress towards PT goals: Progressing toward goals    Frequency    7X/week      PT Plan Current plan remains appropriate    Co-evaluation             End of Session Equipment Utilized During Treatment: Gait belt Activity Tolerance: Patient tolerated treatment well Patient left: in chair;with call bell/phone within reach;with family/visitor present     Time: [id number] PT Time Calculation (min) (ACUTE ONLY): 23 min  Charges:  $Gait Training: 8-22 mins $Therapeutic Activity: 8-22 mins  G Codes:      Zahki Hoogendoorn 11-03-16, 5:11 PM

## 2016-11-01 NOTE — Evaluation (Signed)
Occupational Therapy Evaluation [name] MRN: [id number] DOB: [birth year] Today's Date: 11/01/2016    History of Present Illness s/p L DA THA, h/o R posterior approach 10+ years ago   Clinical Impression   This 76 year old man was admitted for the above sx. He will benefit from continued OT in acute setting to further educate on bathroom transfers.    Follow Up Recommendations  No OT follow up    Equipment Recommendations  3 in 1 bedside commode (if he still doesn't have this)    Recommendations for Other Services       Precautions / Restrictions Precautions Precautions: Fall Restrictions Weight Bearing Restrictions: No      Mobility Bed Mobility Overal bed mobility: Needs Assistance Bed Mobility: Supine to Sit     Supine to sit: Min guard     General bed mobility comments: used bedrail  Transfers Overall transfer level: Needs assistance Equipment used: Rolling walker (2 wheeled) Transfers: Sit to/from Stand Sit to Stand: Min guard;From elevated surface         General transfer comment: for safety; cues for UE/LE placement    Balance                                            ADL Overall ADL's : Needs assistance/impaired     Grooming: Oral care;Supervision/safety;Standing       Lower Body Bathing: Moderate assistance;Sit to/from stand       Lower Body Dressing: Maximal assistance;Sit to/from stand   Toilet Transfer: Min guard;Ambulation;RW (to recliner)             General ADL Comments: performed the above activities. Family will assist with adls     Vision     Perception     Praxis      Pertinent Vitals/Pain Pain Assessment: Faces Faces Pain Scale: Hurts little more Pain Location: l hip Pain Descriptors / Indicators: Aching Pain Intervention(s): Limited activity within patient's tolerance;Monitored during session;Repositioned;RN gave pain meds during session;Ice applied      Hand Dominance     Extremity/Trunk Assessment Upper Extremity Assessment Upper Extremity Assessment: Overall WFL for tasks assessed           Communication Communication Communication: No difficulties   Cognition Arousal/Alertness: Awake/alert Behavior During Therapy: WFL for tasks assessed/performed Overall Cognitive Status: Within Functional Limits for tasks assessed                     General Comments       Exercises       Shoulder Instructions      Home Living Family/patient expects to be discharged to:: Private residence Living Arrangements: Spouse/significant other Available Help at Discharge: Family               Bathroom Shower/Tub: Walk-in Psychologist, prison and probation services: Standard     Home Equipment: Environmental consultant - 2 wheels   Additional Comments: checking to see if he still has 3:1 commode      Prior Functioning/Environment Level of Independence: Independent        Comments: daughter is an OT at peds OP        OT Problem List: Pain;Decreased knowledge of use of DME or AE;Decreased activity tolerance   OT Treatment/Interventions: Self-care/ADL training;DME and/or AE instruction;Patient/family education    OT Goals(Current goals can  be found in the care plan section) Acute Rehab OT Goals Patient Stated Goal: home OT Goal Formulation: With patient Time For Goal Achievement: 11/08/16 Potential to Achieve Goals: Good ADL Goals Pt Will Transfer to Toilet: with supervision;ambulating;bedside commode Pt Will Perform Tub/Shower Transfer: Shower transfer;with min guard assist;ambulating;3 in 1  OT Frequency: Min 2X/week   Barriers to D/C:            Co-evaluation              End of Session    Activity Tolerance: Patient tolerated treatment well Patient left: in chair;with call bell/phone within reach   Time: 0906-0927 OT Time Calculation (min): 21 min Charges:  OT General Charges $OT Visit: 1 Procedure OT Evaluation $OT  Eval Low Complexity: 1 Procedure G-Codes:    Dashonna Chagnon 11/28/2016, 9:59 AM  Lesle Chris, OTR/L [phone] Nov 28, 2016

## 2016-11-01 NOTE — Evaluation (Signed)
Physical Therapy Evaluation [name] MRN: [id number] DOB: 22-May-1941 Today's Date: 11/01/2016   History of Present Illness  s/p L DA THA, h/o R THR posterior approach 10+ years ago  Clinical Impression  Pt s/p L THR presents with decreased L LE strength/ROM and post op pain limiting functional mobility.  Pt should progress to dc home with family assist and HHPT follow up.    Follow Up Recommendations Home health PT    Equipment Recommendations  None recommended by PT    Recommendations for Other Services OT consult     Precautions / Restrictions Precautions Precautions: Fall Restrictions Weight Bearing Restrictions: No Other Position/Activity Restrictions: WBAT      Mobility  Bed Mobility Overal bed mobility: Needs Assistance Bed Mobility: Sit to Supine     Supine to sit: Min guard Sit to supine: Min assist   General bed mobility comments: cues for sequence and min assist to manage L LE onto bed  Transfers Overall transfer level: Needs assistance Equipment used: Rolling walker (2 wheeled) Transfers: Sit to/from Stand Sit to Stand: Min assist         General transfer comment: cues for LE management and use of UEs to self assist  Ambulation/Gait Ambulation/Gait assistance: Min assist;Min guard Ambulation Distance (Feet): 200 Feet Assistive device: Rolling walker (2 wheeled) Gait Pattern/deviations: Step-to pattern;Step-through pattern;Decreased step length - right;Decreased step length - left;Shuffle;Trunk flexed Gait velocity: decr Gait velocity interpretation: Below normal speed for age/gender General Gait Details: cues for posture, position from RW and initial sequence  Stairs            Wheelchair Mobility    Modified Rankin (Stroke Patients Only)       Balance Overall balance assessment: No apparent balance deficits (not formally assessed)                                           Pertinent  Vitals/Pain Pain Assessment: 0-10 Pain Score: 3  Faces Pain Scale: Hurts little more Pain Location: L hip Pain Descriptors / Indicators: Aching;Sore Pain Intervention(s): Limited activity within patient's tolerance;Monitored during session;Premedicated before session;Ice applied    Home Living Family/patient expects to be discharged to:: Private residence Living Arrangements: Spouse/significant other Available Help at Discharge: Family Type of Home: House Home Access: Stairs to enter   Technical brewer of Steps: 1 Home Layout: One level Home Equipment: Environmental consultant - 2 wheels Additional Comments: checking to see if he still has 3:1 commode    Prior Function Level of Independence: Independent         Comments: daughter is an OT at [employer]        Extremity/Trunk Assessment   Upper Extremity Assessment Upper Extremity Assessment: Overall WFL for tasks assessed    Lower Extremity Assessment Lower Extremity Assessment: LLE deficits/detail LLE Deficits / Details: Strength at hip 2+/5 with AAROM at hip to 80 flex and 15 abd    Cervical / Trunk Assessment Cervical / Trunk Assessment: Normal  Communication   Communication: No difficulties  Cognition Arousal/Alertness: Awake/alert Behavior During Therapy: WFL for tasks assessed/performed Overall Cognitive Status: Within Functional Limits for tasks assessed                      General Comments      Exercises Total Joint Exercises Ankle Circles/Pumps:  AROM;Both;15 reps;Supine Quad Sets: AROM;Both;15 reps;Supine Heel Slides: AAROM;Left;20 reps;Supine Hip ABduction/ADduction: AAROM;Left;15 reps;Supine   Assessment/Plan    PT Assessment Patient needs continued PT services  PT Problem List Decreased strength;Decreased range of motion;Decreased activity tolerance;Decreased mobility;Decreased knowledge of use of DME;Pain          PT Treatment Interventions DME instruction;Gait  training;Stair training;Functional mobility training;Therapeutic activities;Therapeutic exercise;Patient/family education    PT Goals (Current goals can be found in the Care Plan section)  Acute Rehab PT Goals Patient Stated Goal: home PT Goal Formulation: With patient Potential to Achieve Goals: Good    Frequency 7X/week   Barriers to discharge        Co-evaluation               End of Session Equipment Utilized During Treatment: Gait belt Activity Tolerance: Patient tolerated treatment well Patient left: in bed;with call bell/phone within reach Nurse Communication: Mobility status         Time: [id number] PT Time Calculation (min) (ACUTE ONLY): 30 min   Charges:   PT Evaluation $PT Eval Low Complexity: 1 Procedure PT Treatments $Therapeutic Exercise: 8-22 mins   PT G Codes:        Jeris Roser 11-15-16, 12:50 PM

## 2016-11-01 NOTE — Anesthesia Postprocedure Evaluation (Signed)
Anesthesia Post Note  Patient: Samuel Porter  Procedure(s) Performed: Procedure(s) (LRB): LEFT TOTAL HIP ARTHROPLASTY ANTERIOR APPROACH (Left)  Patient location during evaluation: PACU Anesthesia Type: Spinal Level of consciousness: awake and alert and oriented Pain management: pain level controlled Vital Signs Assessment: post-procedure vital signs reviewed and stable Respiratory status: spontaneous breathing, nonlabored ventilation and respiratory function stable Cardiovascular status: blood pressure returned to baseline and stable Postop Assessment: no headache, no backache, spinal receding and no signs of nausea or vomiting Anesthetic complications: no                     Taquila Leys A.

## 2016-11-02 LAB — CBC
HCT: 38.2 % — ABNORMAL LOW (ref 39.0–52.0)
Hemoglobin: 12.9 g/dL — ABNORMAL LOW (ref 13.0–17.0)
MCH: 29.5 pg (ref 26.0–34.0)
MCHC: 33.8 g/dL (ref 30.0–36.0)
MCV: 87.2 fL (ref 78.0–100.0)
PLATELETS: 147 10*3/uL — AB (ref 150–400)
RBC: 4.38 MIL/uL (ref 4.22–5.81)
RDW: 13.4 % (ref 11.5–15.5)
WBC: 13.6 10*3/uL — AB (ref 4.0–10.5)

## 2016-11-02 LAB — BASIC METABOLIC PANEL
ANION GAP: 5 (ref 5–15)
BUN: 17 mg/dL (ref 6–20)
CO2: 25 mmol/L (ref 22–32)
CREATININE: 0.82 mg/dL (ref 0.61–1.24)
Calcium: 8.6 mg/dL — ABNORMAL LOW (ref 8.9–10.3)
Chloride: 109 mmol/L (ref 101–111)
GFR calc Af Amer: >60 mL/min (ref >60)
GLUCOSE: 153 mg/dL — AB (ref 65–99)
Potassium: 4.1 mmol/L (ref 3.5–5.1)
Sodium: 139 mmol/L (ref 135–145)

## 2016-11-02 NOTE — Progress Notes (Signed)
Occupational Therapy Treatment [name] MRN: [id number] DOB: 12/29/1940 Today's Date: 11/02/2016    History of present illness s/p L DA THA, h/o R THR posterior approach 10+ years ago   OT comments  Al education completed. PT WANTS [facility] TO COME BY AND TALK TO HIM ABOUT PRICING FOR WIDE COMMODE  Follow Up Recommendations  No OT follow up    Equipment Recommendations  3 in 1 bedside commode (pt is interested in pricing for wide model)    Recommendations for Other Services      Precautions / Restrictions Precautions Precautions: Fall Restrictions Weight Bearing Restrictions: No Other Position/Activity Restrictions: WBAT       Mobility Bed Mobility         Supine to sit: Supervision        Transfers   Equipment used: Rolling walker (2 wheeled)   Sit to Stand: Supervision         General transfer comment: cues for LLE    Balance                                   ADL                           Toilet Transfer: Supervision/safety;Ambulation;RW       Tub/ Shower Transfer: Min guard;3 in 1;Ambulation;Walk-in shower     General ADL Comments: pt stood at commode. He is interested in 3:1 but wants a wider one      Vision                     Perception     Praxis      Cognition   Behavior During Therapy: WFL for tasks assessed/performed Overall Cognitive Status: Within Functional Limits for tasks assessed                       Extremity/Trunk Assessment               Exercises     Shoulder Instructions       General Comments      Pertinent Vitals/ Pain       Pain Score: 3  Pain Location: l hip Pain Descriptors / Indicators: Aching;Sore Pain Intervention(s): Limited activity within patient's tolerance;Monitored during session;Premedicated before session;Repositioned  Home Living                                          Prior  Functioning/Environment              Frequency           Progress Toward Goals  OT Goals(current goals can now be found in the care plan section)  Progress towards OT goals: Goals met and updated - see care plan     Plan      Co-evaluation                 End of Session     Activity Tolerance Patient tolerated treatment well   Patient Left in chair;with call bell/phone within reach   Nurse Communication          Time: 0827-0844 OT Time Calculation (min): 17 min  Charges: OT General Charges $OT Visit: 1 Procedure   OT Treatments $Self Care/Home Management : 8-22 mins  SPENCER,MARYELLEN 11/02/2016, 8:51 AM  Maryellen Spencer, OTR/L [phone] 11/02/2016  

## 2016-11-02 NOTE — Progress Notes (Signed)
   Subjective: 2 Days Post-Op Procedure(s) (LRB): LEFT TOTAL HIP ARTHROPLASTY ANTERIOR APPROACH (Left) Patient reports pain as mild.  Plan is to go Home after hospital stay.  Objective: Vital signs in last 24 hours: Temp:  [97.5 F (36.4 C)-98.5 F (36.9 C)] 98.5 F (36.9 C) (01/19 0614) Pulse Rate:  [57-64] 61 (01/19 0614) Resp:  [17-18] 17 (01/19 0614) BP: (114-147)/(60-69) 135/68 (01/19 0735) SpO2:  [98 %-99 %] 98 % (01/19 0614)  Intake/Output from previous day:  Intake/Output Summary (Last 24 hours) at 11/02/16 0807 Last data filed at 11/02/16 0615  Gross per 24 hour  Intake             1800 ml  Output             2675 ml  Net             -875 ml    Intake/Output this shift: No intake/output data recorded.  Labs:  Recent Labs  11/01/16 0430 11/02/16 0415  HGB 13.8 12.9*    Recent Labs  11/01/16 0430 11/02/16 0415  WBC 9.4 13.6*  RBC 4.67 4.38  HCT 40.6 38.2*  PLT 136* 147*    Recent Labs  11/01/16 0430 11/02/16 0415  NA 138 139  K 4.1 4.1  CL 107 109  CO2 25 25  BUN 13 17  CREATININE 0.88 0.82  GLUCOSE 158* 153*  CALCIUM 8.5* 8.6*   No results for input(s): LABPT, INR in the last 72 hours.  EXAM General - Patient is Alert, Appropriate and Oriented Extremity - Neurologically intact Neurovascular intact No cellulitis present Compartment soft Dressing/Incision - clean, dry, no drainage Motor Function - intact, moving foot and toes well on exam.   Past Medical History:  Diagnosis Date  . Arthritis    oa  . BPH (benign prostatic hyperplasia)   . Cancer ([place]) 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  . Cataracts, bilateral   . Glaucoma   . Hypertension   . Vitamin D deficiency     Assessment/Plan: 2 Days Post-Op Procedure(s) (LRB): LEFT TOTAL HIP ARTHROPLASTY ANTERIOR APPROACH (Left) Principal Problem:   OA (osteoarthritis) of hip   Discharge home with home health  DVT Prophylaxis - Xarelto Weight Bearing As Tolerated left  Leg  Zhoe Catania V 11/02/2016, 8:07 AM

## 2016-11-02 NOTE — Progress Notes (Signed)
Physical Therapy Treatment [name] MRN: [id number] DOB: May 02, 1941 Today's Date: 11/02/2016    History of Present Illness s/p L DA THA, h/o R THR posterior approach 10+ years ago    PT Comments    Pt progressing well with mobility and eager for dc home.  Reviewed stairs, car transfers and therex program with written instruction provided.  Follow Up Recommendations  Home health PT     Equipment Recommendations  3in1 (PT)    Recommendations for Other Services OT consult     Precautions / Restrictions Precautions Precautions: Fall Restrictions Weight Bearing Restrictions: No Other Position/Activity Restrictions: WBAT    Mobility  Bed Mobility Overal bed mobility: Needs Assistance Bed Mobility: Sit to Supine       Sit to supine: Supervision   General bed mobility comments: min cues for sequence but no physical assist  Transfers Overall transfer level: Needs assistance Equipment used: Rolling walker (2 wheeled) Transfers: Sit to/from Stand Sit to Stand: Supervision         General transfer comment: cues for LLE  Ambulation/Gait Ambulation/Gait assistance: Min guard;Supervision Ambulation Distance (Feet): 400 Feet Assistive device: Rolling walker (2 wheeled) Gait Pattern/deviations: Step-to pattern;Step-through pattern;Decreased step length - right;Decreased step length - left;Shuffle;Trunk flexed Gait velocity: decr Gait velocity interpretation: Below normal speed for age/gender General Gait Details: min cues for posture, position from RW   Stairs Stairs: Yes   Stair Management: No rails;Step to pattern;Forwards;With walker Number of Stairs: 1 General stair comments: Single step twice with RW and min cues for sequence and foot/RW placement.  Wheelchair Mobility    Modified Rankin (Stroke Patients Only)       Balance Overall balance assessment: No apparent balance deficits (not formally assessed)                                  Cognition Arousal/Alertness: Awake/alert Behavior During Therapy: WFL for tasks assessed/performed Overall Cognitive Status: Within Functional Limits for tasks assessed                      Exercises Total Joint Exercises Ankle Circles/Pumps: AROM;Both;15 reps;Supine Quad Sets: AROM;Both;15 reps;Supine Heel Slides: AAROM;Left;20 reps;Supine Hip ABduction/ADduction: AAROM;Left;15 reps;Supine Long Arc Quad: AROM;10 reps;Seated;Left    General Comments        Pertinent Vitals/Pain Pain Assessment: 0-10 Pain Score: 3  Pain Location: L hip Pain Descriptors / Indicators: Aching;Sore Pain Intervention(s): Limited activity within patient's tolerance;Monitored during session;Premedicated before session;Ice applied    Home Living                      Prior Function            PT Goals (current goals can now be found in the care plan section) Acute Rehab PT Goals Patient Stated Goal: home PT Goal Formulation: With patient Potential to Achieve Goals: Good Progress towards PT goals: Progressing toward goals    Frequency    7X/week      PT Plan Current plan remains appropriate    Co-evaluation             End of Session Equipment Utilized During Treatment: Gait belt Activity Tolerance: Patient tolerated treatment well Patient left: in bed;with call bell/phone within reach     Time: [id number] PT Time Calculation (min) (ACUTE ONLY): 28 min  Charges:  $Gait Training: 8-22 mins $Therapeutic Exercise: 8-22 mins  G Codes:      Samuel Porter 2016-11-19, 12:24 PM

## 2018-06-17 ENCOUNTER — Emergency Department (HOSPITAL_COMMUNITY)
Admission: EM | Admit: 2018-06-17 | Discharge: 2018-06-17 | Disposition: A | Discharge status: Home / Self Care | Payer: Medicare Other | Attending: Emergency Medicine | Admitting: Emergency Medicine

## 2018-06-17 ENCOUNTER — Other Ambulatory Visit: Payer: Self-pay

## 2018-06-17 ENCOUNTER — Encounter (HOSPITAL_COMMUNITY): Payer: Self-pay

## 2018-06-17 ENCOUNTER — Emergency Department (HOSPITAL_COMMUNITY): Payer: Medicare Other

## 2018-06-17 DIAGNOSIS — R0781 Pleurodynia: Secondary | ICD-10-CM | POA: Diagnosis not present

## 2018-06-17 DIAGNOSIS — Z96643 Presence of artificial hip joint, bilateral: Secondary | ICD-10-CM | POA: Insufficient documentation

## 2018-06-17 DIAGNOSIS — I10 Essential (primary) hypertension: Secondary | ICD-10-CM | POA: Insufficient documentation

## 2018-06-17 DIAGNOSIS — Z79899 Other long term (current) drug therapy: Secondary | ICD-10-CM | POA: Insufficient documentation

## 2018-06-17 DIAGNOSIS — Z85828 Personal history of other malignant neoplasm of skin: Secondary | ICD-10-CM | POA: Insufficient documentation

## 2018-06-17 DIAGNOSIS — R072 Precordial pain: Secondary | ICD-10-CM

## 2018-06-17 DIAGNOSIS — Z87891 Personal history of nicotine dependence: Secondary | ICD-10-CM | POA: Insufficient documentation

## 2018-06-17 DIAGNOSIS — Z9049 Acquired absence of other specified parts of digestive tract: Secondary | ICD-10-CM | POA: Insufficient documentation

## 2018-06-17 DIAGNOSIS — R079 Chest pain, unspecified: Secondary | ICD-10-CM | POA: Diagnosis present

## 2018-06-17 DIAGNOSIS — Z7901 Long term (current) use of anticoagulants: Secondary | ICD-10-CM | POA: Diagnosis not present

## 2018-06-17 LAB — CBC
HEMATOCRIT: 45.3 % (ref 39.0–52.0)
Hemoglobin: 15.2 g/dL (ref 13.0–17.0)
MCH: 30.2 pg (ref 26.0–34.0)
MCHC: 33.6 g/dL (ref 30.0–36.0)
MCV: 89.9 fL (ref 78.0–100.0)
Platelets: 129 10*3/uL — ABNORMAL LOW (ref 150–400)
RBC: 5.04 MIL/uL (ref 4.22–5.81)
RDW: 13.9 % (ref 11.5–15.5)
WBC: 9.6 10*3/uL (ref 4.0–10.5)

## 2018-06-17 LAB — COMPREHENSIVE METABOLIC PANEL
ALBUMIN: 4 g/dL (ref 3.5–5.0)
ALT: 24 U/L (ref 0–44)
ANION GAP: 8 (ref 5–15)
AST: 22 U/L (ref 15–41)
Alkaline Phosphatase: 48 U/L (ref 38–126)
BILIRUBIN TOTAL: 1.9 mg/dL — AB (ref 0.3–1.2)
BUN: 14 mg/dL (ref 8–23)
CO2: 26 mmol/L (ref 22–32)
Calcium: 9.1 mg/dL (ref 8.9–10.3)
Chloride: 104 mmol/L (ref 98–111)
Creatinine, Ser: 0.88 mg/dL (ref 0.61–1.24)
GLUCOSE: 141 mg/dL — AB (ref 70–99)
POTASSIUM: 4.3 mmol/L (ref 3.5–5.1)
Sodium: 138 mmol/L (ref 135–145)
TOTAL PROTEIN: 7.3 g/dL (ref 6.5–8.1)

## 2018-06-17 LAB — I-STAT TROPONIN, ED
TROPONIN I, POC: 0.04 ng/mL (ref 0.00–0.08)
TROPONIN I, POC: 0.03 ng/mL (ref 0.00–0.08)

## 2018-06-17 LAB — D-DIMER, QUANTITATIVE (NOT AT ARMC): D DIMER QUANT: 0.38 ug{FEU}/mL (ref 0.00–0.50)

## 2018-06-17 MED ORDER — KETOROLAC TROMETHAMINE 15 MG/ML IJ SOLN
15.0000 mg | Freq: Once | INTRAMUSCULAR | Status: AC
Start: 2018-06-17 — End: 2018-06-17
  Administered 2018-06-17: 15 mg via INTRAVENOUS
  Filled 2018-06-17: qty 1

## 2018-06-17 MED ORDER — NITROGLYCERIN 0.4 MG SL SUBL
0.4000 mg | SUBLINGUAL_TABLET | SUBLINGUAL | Status: DC | PRN
  Administered 2018-06-17: 0.4 mg via SUBLINGUAL
  Filled 2018-06-17: qty 1

## 2018-06-17 MED ORDER — ASPIRIN 81 MG PO CHEW
324.0000 mg | CHEWABLE_TABLET | Freq: Once | ORAL | Status: AC
  Administered 2018-06-17: 324 mg via ORAL
  Filled 2018-06-17: qty 4

## 2018-06-17 NOTE — ED Triage Notes (Signed)
Pt presents to ED from home for chest pain. Pt reports that pain started around 3 pm while he was driving, but the pain hasn't gone away. Pt took medication at home with no relief.

## 2018-06-17 NOTE — ED Provider Notes (Signed)
[facility] DEPT Provider Note   CSN: [id number] Arrival date & time: 06/17/18  0303     History   Chief Complaint Chief Complaint  Patient presents with  . Chest Pain    HPI Samuel Porter is a 77 y.o. male.  HPI 77 year old male presents the emergency department with chest tightness over the past 10 to 12 hours.  This occurred when he was driving home from [place].  It is somewhat worse with deep breathing.  No history of DVT or pulmonary embolism.  No history of coronary artery disease.  No prior history of stress test or heart catheterization.  He denies unilateral leg swelling.  He was with his grandchildren to the weekend.  No significant shortness of breath at this time.  Presents now to the emergency department because the pain is persisting and not improving.  It is central precordial in nature.  No back pain.  No arm pain.  No neck or jaw pain.  No diaphoresis.  No nausea or vomiting.  Symptoms are mild to moderate in severity   Past Medical History:  Diagnosis Date  . Arthritis    oa  . BPH (benign prostatic hyperplasia)   . Cancer ([place]) 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  . Cataracts, bilateral   . Glaucoma   . Hypertension   . Vitamin D deficiency     Patient Active Problem List   Diagnosis Date Noted  . OA (osteoarthritis) of hip 10/31/2016    Past Surgical History:  Procedure Laterality Date  . CHOLECYSTECTOMY    . colonscopy  2017  . JOINT REPLACEMENT Right    hip   . 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   Procedure: LEFT TOTAL HIP ARTHROPLASTY ANTERIOR APPROACH;  Surgeon: Gaynelle Arabian, MD;  Location: [facility] ORS;  Service: Orthopedics;  Laterality: Left;        Home Medications    Prior to Admission medications   Medication Sig Start Date End Date Taking? Authorizing Provider  amLODipine (NORVASC) 5 MG tablet Take 5 mg by mouth daily.    [provider]  dorzolamide-timolol (COSOPT) 22.3-6.8 MG/ML  ophthalmic solution Place 1 drop into both eyes 2 (two) times daily. 07/09/16   [provider]  finasteride (PROSCAR) 5 MG tablet Take 5 mg by mouth daily.    [provider]  latanoprost (XALATAN) 0.005 % ophthalmic solution Place 1 drop into both eyes at bedtime.    [provider]  methocarbamol (ROBAXIN) 500 MG tablet Take 1 tablet (500 mg total) by mouth every 6 (six) hours as needed for muscle spasms. 11/01/16   Perkins, Alexzandrew L, PA-C  oxyCODONE (OXY IR/ROXICODONE) 5 MG immediate release tablet Take 1-2 tablets (5-10 mg total) by mouth every 4 (four) hours as needed for moderate pain or severe pain. 11/01/16   Perkins, Alexzandrew L, PA-C  rivaroxaban (XARELTO) 10 MG TABS tablet Take 1 tablet (10 mg total) by mouth daily with breakfast. Take Xarelto for two and a half more weeks following discharge from the hospital, then discontinue Xarelto. Once the patient has completed the Xarelto, they may resume the 81 mg Aspirin. 11/02/16   Perkins, Alexzandrew L, PA-C  sildenafil (VIAGRA) 25 MG tablet Take 25 mg by mouth daily as needed for erectile dysfunction.    [provider]  traMADol (ULTRAM) 50 MG tablet Take 1-2 tablets (50-100 mg total) by mouth every 6 (six) hours as needed for moderate pain. 11/01/16   Dara Lords,  Alexzandrew L, PA-C    Family History History reviewed. No pertinent family history.  Social History Social History   Tobacco Use  . Smoking status: Former Smoker    Packs/day: 1.50    Years: 10.00    Pack years: 15.00    Types: Cigarettes  . Smokeless tobacco: Never Used  Substance Use Topics  . Alcohol use: Yes    Comment: 1-2 glass wine per day  . Drug use: No     Allergies   Patient has no known allergies.   Review of Systems Review of Systems  All other systems reviewed and are negative.    Physical Exam Updated Vital Signs BP (!) 157/77 (BP Location: Left Arm)   Pulse 65   Temp 99.3 F (37.4 C) (Oral)   Resp 18    Ht 6\' 2"  (1.88 m)   Wt 102.1 kg   SpO2 95%   BMI 28.89 kg/m   Physical Exam  Constitutional: He is oriented to person, place, and time. He appears well-developed and well-nourished.  HENT:  Head: Normocephalic and atraumatic.  Eyes: EOM are normal.  Neck: Normal range of motion.  Cardiovascular: Normal rate, regular rhythm, normal heart sounds and intact distal pulses.  Pulmonary/Chest: Effort normal and breath sounds normal. No respiratory distress.  Abdominal: Soft. He exhibits no distension. There is no tenderness.  Musculoskeletal: Normal range of motion. He exhibits no edema or tenderness.  Neurological: He is alert and oriented to person, place, and time.  Skin: Skin is warm and dry.  Psychiatric: He has a normal mood and affect. Judgment normal.  Nursing note and vitals reviewed.    ED Treatments / Results  Labs (all labs ordered are listed, but only abnormal results are displayed) Labs Reviewed  CBC - Abnormal; Notable for the following components:      Result Value   Platelets 129 (*)    All other components within normal limits  COMPREHENSIVE METABOLIC PANEL - Abnormal; Notable for the following components:   Glucose, Bld 141 (*)    Total Bilirubin 1.9 (*)    All other components within normal limits  D-DIMER, QUANTITATIVE (NOT AT [facility])  I-STAT TROPONIN, ED  I-STAT TROPONIN, ED    EKG EKG Interpretation #1  Date/Time:  Tuesday June 17 2018 03:16:11 EDT Ventricular Rate:  63 PR Interval:    QRS Duration: 91 QT Interval:  390 QTC Calculation: 400 R Axis:   64 Text Interpretation:  Sinus rhythm Baseline wander in lead(s) V6 ** Poor data quality, interpretation may be adversely affected No significant change was found Reconfirmed by Jola Schmidt [phone]) on 06/17/2018 3:38:02 AM    EKG Interpretation #2  Date/Time:  Tuesday June 17 2018 03:16:11 EDT Ventricular Rate:  63** PR Interval:    QRS Duration: 91 QT Interval:  390 QTC  Calculation: 400 R Axis:   64 Text Interpretation:  Sinus rhythm Baseline wander in lead(s) V6  Poor data quality, interpretation may be adversely affected No significant change was found Reconfirmed by Jola Schmidt [phone]) on 06/17/2018 3:38:02 AM           Radiology Dg Chest 2 View  Result Date: 06/17/2018 CLINICAL DATA:  77 year old male with chest pain. 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 No focal consolidation, pleural effusion, or pneumothorax. The cardiac silhouette is within normal limits. No acute osseous pathology. IMPRESSION: No active cardiopulmonary disease. Electronically Signed  By: Anner Crete M.D.   On: 06/17/2018 04:48    Procedures Procedures (including critical care time)  Medications Ordered in ED Medications  nitroGLYCERIN (NITROSTAT) SL tablet 0.4 mg (0.4 mg Sublingual Given 06/17/18 0500)  aspirin chewable tablet 324 mg (324 mg Oral Given 06/17/18 0449)  ketorolac (TORADOL) 15 MG/ML injection 15 mg (15 mg Intravenous Given 06/17/18 0530)     Initial Impression / Assessment and Plan / ED Course  I have reviewed the triage vital signs and the nursing notes.  Pertinent labs & imaging results that were available during my care of the patient were reviewed by me and considered in my medical decision making (see chart for details).     Patient is overall well-appearing.  His symptoms have been present for approximately 12 hours.  His EKG is without ischemic changes.  Troponin negative x2.  D-dimer is negative.  Doubt dissection.  Doubt acute coronary syndrome.  I suspect much of this is either pleurisy or pericarditis.  Discharged home with anti-inflammatories.  Primary care follow-up.  Patient understands return to the emergency department for new or worsening symptoms  Final Clinical Impressions(s) / ED Diagnoses   Final diagnoses:  Precordial chest pain   Pleuritic chest pain    ED Discharge Orders    None       Jola Schmidt, MD 06/17/18 [phone]

## 2018-06-17 NOTE — Discharge Instructions (Addendum)
Take ibuprofen and tylenol as needed for your symptoms  Please return to the emergency department for new or worsening symptoms

## 2019-11-04 ENCOUNTER — Ambulatory Visit: Payer: Medicare Other | Attending: Internal Medicine

## 2019-11-04 DIAGNOSIS — Z23 Encounter for immunization: Secondary | ICD-10-CM

## 2019-11-04 NOTE — Progress Notes (Signed)
   Covid-19 Vaccination Clinic  Name:  Samuel Porter    MRN: [id number] DOB: 21-Dec-1940  11/04/2019  [name] was observed post Covid-19 immunization for 15 minutes without incidence. He was provided with Vaccine Information Sheet and instruction to access the V-Safe system.   [name] was instructed to call 911 with any severe reactions post vaccine: Marland Kitchen Difficulty breathing  . Swelling of your face and throat  . A fast heartbeat  . A bad rash all over your body  . Dizziness and weakness    Immunizations Administered    Name Date Dose VIS Date Route   Pfizer COVID-19 Vaccine 11/04/2019 12:33 PM 0.3 mL 09/25/2019 Intramuscular   Manufacturer: [place]   Lot: [id number]   [place]: [id number]

## 2019-11-22 ENCOUNTER — Ambulatory Visit: Payer: Medicare Other | Attending: Internal Medicine

## 2019-11-22 DIAGNOSIS — Z23 Encounter for immunization: Secondary | ICD-10-CM | POA: Insufficient documentation

## 2019-11-22 NOTE — Progress Notes (Signed)
   Covid-19 Vaccination Clinic  Name:  Samuel Porter    MRN: [id number] DOB: 09-27-1941  11/22/2019  Samuel Porter was observed post Covid-19 immunization for 15 minutes without incidence. He was provided with Vaccine Information Sheet and instruction to access the V-Safe system.   Samuel Porter was instructed to call 911 with any severe reactions post vaccine: Marland Kitchen Difficulty breathing  . Swelling of your face and throat  . A fast heartbeat  . A bad rash all over your body  . Dizziness and weakness    Immunizations Administered    Name Date Dose VIS Date Route   Pfizer COVID-19 Vaccine 11/22/2019  1:40 PM 0.3 mL 09/25/2019 Intramuscular   Manufacturer: [employer]   Lot: [id number]   [place]: [id number]

## 2020-07-08 ENCOUNTER — Other Ambulatory Visit (HOSPITAL_COMMUNITY): Payer: Self-pay

## 2020-07-08 ENCOUNTER — Other Ambulatory Visit: Payer: Self-pay | Admitting: Physician Assistant

## 2020-07-08 DIAGNOSIS — U071 COVID-19: Secondary | ICD-10-CM

## 2020-07-08 DIAGNOSIS — R54 Age-related physical debility: Secondary | ICD-10-CM

## 2020-07-08 NOTE — Progress Notes (Signed)
I connected by phone with Rance Muir on 07/08/2020 at 8:53 PM to discuss the potential use of a new treatment for mild to moderate COVID-19 viral infection in non-hospitalized patients.  This patient is a 79 y.o. male that meets the FDA criteria for Emergency Use Authorization of COVID monoclonal antibody casirivimab/imdevimab.  Has a (+) direct SARS-CoV-2 viral test result  Has mild or moderate COVID-19   Is NOT hospitalized due to COVID-19  Is within 10 days of symptom onset  Has at least one of the high risk factor(s) for progression to severe COVID-19 and/or hospitalization as defined in EUA.  Specific high risk criteria : Older age (>/= 79 yo)   I have spoken and communicated the following to the patient or parent/caregiver regarding COVID monoclonal antibody treatment:  1. FDA has authorized the emergency use for the treatment of mild to moderate COVID-19 in adults and pediatric patients with positive results of direct SARS-CoV-2 viral testing who are 42 years of age and older weighing at least 40 kg, and who are at high risk for progressing to severe COVID-19 and/or hospitalization.  2. The significant known and potential risks and benefits of COVID monoclonal antibody, and the extent to which such potential risks and benefits are unknown.  3. Information on available alternative treatments and the risks and benefits of those alternatives, including clinical trials.  4. Patients treated with COVID monoclonal antibody should continue to self-isolate and use infection control measures (e.g., wear mask, isolate, social distance, avoid sharing personal items, clean and disinfect "high touch" surfaces, and frequent handwashing) according to CDC guidelines.   5. The patient or parent/caregiver has the option to accept or refuse COVID monoclonal antibody treatment.  After reviewing this information with the patient, the patient has agreed to receive one of the available covid 19  monoclonal antibodies and will be provided an appropriate fact sheet prior to infusion.  Sx onset 9/21. Set up for infusion on 9/25 @ 12:30. Directions given to [facility]. Pt is aware that insurance will be charged an infusion fee. Pt is fully vaccinated with Lonna Duval 07/08/2020 8:53 PM

## 2020-07-09 ENCOUNTER — Ambulatory Visit (HOSPITAL_COMMUNITY)
Admission: RE | Admit: 2020-07-09 | Discharge: 2020-07-09 | Disposition: A | Discharge status: Home / Self Care | Payer: Medicare Other | Source: Ambulatory Visit | Attending: Pulmonary Disease | Admitting: Pulmonary Disease

## 2020-07-09 DIAGNOSIS — Z23 Encounter for immunization: Secondary | ICD-10-CM | POA: Insufficient documentation

## 2020-07-09 DIAGNOSIS — U071 COVID-19: Secondary | ICD-10-CM

## 2020-07-09 DIAGNOSIS — R54 Age-related physical debility: Secondary | ICD-10-CM | POA: Insufficient documentation

## 2020-07-09 MED ORDER — EPINEPHRINE 0.3 MG/0.3ML IJ SOAJ: 0.3000 mg | Freq: Once | INTRAMUSCULAR | Status: DC | PRN

## 2020-07-09 MED ORDER — FAMOTIDINE IN NACL 20-0.9 MG/50ML-% IV SOLN
20.0000 mg | Freq: Once | INTRAVENOUS | Status: AC | PRN
  Administered 2020-07-09: 20 mg via INTRAVENOUS
  Filled 2020-07-09: qty 50

## 2020-07-09 MED ORDER — ALBUTEROL SULFATE HFA 108 (90 BASE) MCG/ACT IN AERS: 2.0000 | INHALATION_SPRAY | Freq: Once | RESPIRATORY_TRACT | Status: DC | PRN

## 2020-07-09 MED ORDER — SODIUM CHLORIDE 0.9 % IV SOLN
1200.0000 mg | Freq: Once | INTRAVENOUS | Status: AC
  Administered 2020-07-09: 1200 mg via INTRAVENOUS

## 2020-07-09 MED ORDER — DIPHENHYDRAMINE HCL 50 MG/ML IJ SOLN: 50.0000 mg | Freq: Once | INTRAMUSCULAR | Status: DC | PRN

## 2020-07-09 MED ORDER — ACETAMINOPHEN 325 MG PO TABS
650.0000 mg | ORAL_TABLET | Freq: Once | ORAL | Status: AC
  Administered 2020-07-09: 650 mg via ORAL
  Filled 2020-07-09: qty 2

## 2020-07-09 MED ORDER — METHYLPREDNISOLONE SODIUM SUCC 125 MG IJ SOLR
125.0000 mg | Freq: Once | INTRAMUSCULAR | Status: AC | PRN
  Administered 2020-07-09: 125 mg via INTRAVENOUS
  Filled 2020-07-09: qty 2

## 2020-07-09 MED ORDER — SODIUM CHLORIDE 0.9 % IV SOLN: INTRAVENOUS | Status: DC | PRN

## 2020-07-09 NOTE — Progress Notes (Addendum)
  Diagnosis: COVID-19  Physician: Dr Joya Gaskins  Procedure: Covid Infusion Clinic Med: casirivimab\imdevimab infusion - Provided patient with casirivimab\imdevimab fact sheet for patients, parents and caregivers prior to infusion.  Complications: No immediate complications noted.  Patient noted to have chills and feeling cold before discharge. NP on duty aware. V/S stable as recorded except for RR of 36. Solu Medrol given and Famotidine given.    Discharge: Discharged home   Samuel Porter 07/09/2020  @1549 ,pt is discharge with no complaints of feeling cold and chills.

## 2020-07-09 NOTE — Discharge Instructions (Signed)

## 2020-08-08 ENCOUNTER — Other Ambulatory Visit: Payer: Self-pay

## 2020-08-08 ENCOUNTER — Ambulatory Visit
Admission: RE | Admit: 2020-08-08 | Discharge: 2020-08-08 | Disposition: A | Discharge status: Home / Self Care | Payer: Medicare Other | Source: Ambulatory Visit | Attending: Family Medicine | Admitting: Family Medicine

## 2020-08-08 ENCOUNTER — Other Ambulatory Visit: Payer: Self-pay | Admitting: Family Medicine

## 2020-08-08 DIAGNOSIS — U071 COVID-19: Secondary | ICD-10-CM

## 2020-08-08 DIAGNOSIS — J1282 Pneumonia due to coronavirus disease 2019: Secondary | ICD-10-CM

## 2020-09-21 ENCOUNTER — Other Ambulatory Visit: Payer: Self-pay | Admitting: Family Medicine

## 2020-09-21 ENCOUNTER — Other Ambulatory Visit: Payer: Self-pay

## 2020-09-21 ENCOUNTER — Ambulatory Visit
Admission: RE | Admit: 2020-09-21 | Discharge: 2020-09-21 | Disposition: A | Discharge status: Home / Self Care | Payer: Medicare Other | Source: Ambulatory Visit | Attending: Family Medicine | Admitting: Family Medicine

## 2020-09-21 DIAGNOSIS — Z8701 Personal history of pneumonia (recurrent): Secondary | ICD-10-CM

## 2020-10-01 ENCOUNTER — Ambulatory Visit: Payer: Medicare Other | Attending: Internal Medicine

## 2020-10-01 DIAGNOSIS — Z23 Encounter for immunization: Secondary | ICD-10-CM

## 2020-10-01 NOTE — Progress Notes (Signed)
° °  Covid-19 Vaccination Clinic  Name:  Samuel Porter    MRN: [id number] DOB: 12-14-1940  10/01/2020  [name] was observed post Covid-19 immunization for 15 minutes without incident. He was provided with Vaccine Information Sheet and instruction to access the V-Safe system.   [name] was instructed to call 911 with any severe reactions post vaccine:  Difficulty breathing   Swelling of face and throat   A fast heartbeat   A bad rash all over body   Dizziness and weakness   Immunizations Administered    Name Date Dose VIS Date Route   Pfizer COVID-19 Vaccine 10/01/2020 10:36 AM 0.3 mL 08/03/2020 Intramuscular   Manufacturer: [place]   Lot: [id number]   [place]: [id number]

## 2020-10-28 DIAGNOSIS — H0012 Chalazion right lower eyelid: Secondary | ICD-10-CM | POA: Diagnosis not present

## 2020-12-13 DIAGNOSIS — L821 Other seborrheic keratosis: Secondary | ICD-10-CM | POA: Diagnosis not present

## 2020-12-13 DIAGNOSIS — L814 Other melanin hyperpigmentation: Secondary | ICD-10-CM | POA: Diagnosis not present

## 2020-12-13 DIAGNOSIS — D0472 Carcinoma in situ of skin of left lower limb, including hip: Secondary | ICD-10-CM | POA: Diagnosis not present

## 2020-12-13 DIAGNOSIS — D0462 Carcinoma in situ of skin of left upper limb, including shoulder: Secondary | ICD-10-CM | POA: Diagnosis not present

## 2020-12-13 DIAGNOSIS — L570 Actinic keratosis: Secondary | ICD-10-CM | POA: Diagnosis not present

## 2020-12-13 DIAGNOSIS — Z85828 Personal history of other malignant neoplasm of skin: Secondary | ICD-10-CM | POA: Diagnosis not present

## 2021-01-03 DIAGNOSIS — C44729 Squamous cell carcinoma of skin of left lower limb, including hip: Secondary | ICD-10-CM | POA: Diagnosis not present

## 2021-03-16 DIAGNOSIS — L570 Actinic keratosis: Secondary | ICD-10-CM | POA: Diagnosis not present

## 2021-03-16 DIAGNOSIS — D225 Melanocytic nevi of trunk: Secondary | ICD-10-CM | POA: Diagnosis not present

## 2021-03-16 DIAGNOSIS — L821 Other seborrheic keratosis: Secondary | ICD-10-CM | POA: Diagnosis not present

## 2021-03-16 DIAGNOSIS — L814 Other melanin hyperpigmentation: Secondary | ICD-10-CM | POA: Diagnosis not present

## 2021-03-16 DIAGNOSIS — C4442 Squamous cell carcinoma of skin of scalp and neck: Secondary | ICD-10-CM | POA: Diagnosis not present

## 2021-03-16 DIAGNOSIS — Z85828 Personal history of other malignant neoplasm of skin: Secondary | ICD-10-CM | POA: Diagnosis not present

## 2021-03-16 DIAGNOSIS — D485 Neoplasm of uncertain behavior of skin: Secondary | ICD-10-CM | POA: Diagnosis not present

## 2021-04-04 DIAGNOSIS — Z961 Presence of intraocular lens: Secondary | ICD-10-CM | POA: Diagnosis not present

## 2021-04-04 DIAGNOSIS — H401123 Primary open-angle glaucoma, left eye, severe stage: Secondary | ICD-10-CM | POA: Diagnosis not present

## 2021-04-04 DIAGNOSIS — H401111 Primary open-angle glaucoma, right eye, mild stage: Secondary | ICD-10-CM | POA: Diagnosis not present

## 2021-05-05 ENCOUNTER — Other Ambulatory Visit (HOSPITAL_BASED_OUTPATIENT_CLINIC_OR_DEPARTMENT_OTHER): Payer: Self-pay

## 2021-05-05 ENCOUNTER — Ambulatory Visit: Payer: Medicare Other | Attending: Internal Medicine

## 2021-05-05 ENCOUNTER — Other Ambulatory Visit: Payer: Self-pay

## 2021-05-05 DIAGNOSIS — Z23 Encounter for immunization: Secondary | ICD-10-CM

## 2021-05-05 MED ORDER — PFIZER-BIONT COVID-19 VAC-TRIS 30 MCG/0.3ML IM SUSP
INTRAMUSCULAR | 0 refills | Status: DC
  Filled 2021-05-05: qty 0.3, 1d supply, fill #0

## 2021-05-05 NOTE — Progress Notes (Signed)
   Covid-19 Vaccination Clinic  Name:  Samuel Porter    MRN: [id number] DOB: Aug 05, 1941  05/05/2021  [name] was observed post Covid-19 immunization for 15 minutes without incident. He was provided with Vaccine Information Sheet and instruction to access the V-Safe system.   [name] was instructed to call 911 with any severe reactions post vaccine: Difficulty breathing  Swelling of face and throat  A fast heartbeat  A bad rash all over body  Dizziness and weakness   Immunizations Administered     Name Date Dose VIS Date Route   PFIZER Comrnaty(Gray TOP) Covid-19 Vaccine 05/05/2021 11:36 AM 0.3 mL 09/22/2020 Intramuscular   Manufacturer: [place]   Lot: [id number]   [place]: [phone]

## 2021-06-20 DIAGNOSIS — E559 Vitamin D deficiency, unspecified: Secondary | ICD-10-CM | POA: Diagnosis not present

## 2021-06-20 DIAGNOSIS — R7309 Other abnormal glucose: Secondary | ICD-10-CM | POA: Diagnosis not present

## 2021-06-20 DIAGNOSIS — I10 Essential (primary) hypertension: Secondary | ICD-10-CM | POA: Diagnosis not present

## 2021-06-21 DIAGNOSIS — H409 Unspecified glaucoma: Secondary | ICD-10-CM | POA: Diagnosis not present

## 2021-06-21 DIAGNOSIS — R7309 Other abnormal glucose: Secondary | ICD-10-CM | POA: Diagnosis not present

## 2021-06-21 DIAGNOSIS — I10 Essential (primary) hypertension: Secondary | ICD-10-CM | POA: Diagnosis not present

## 2021-06-21 DIAGNOSIS — E559 Vitamin D deficiency, unspecified: Secondary | ICD-10-CM | POA: Diagnosis not present

## 2021-06-21 DIAGNOSIS — Z1211 Encounter for screening for malignant neoplasm of colon: Secondary | ICD-10-CM | POA: Diagnosis not present

## 2021-06-21 DIAGNOSIS — Z85828 Personal history of other malignant neoplasm of skin: Secondary | ICD-10-CM | POA: Diagnosis not present

## 2021-06-21 DIAGNOSIS — Z0000 Encounter for general adult medical examination without abnormal findings: Secondary | ICD-10-CM | POA: Diagnosis not present

## 2021-07-13 DIAGNOSIS — Z23 Encounter for immunization: Secondary | ICD-10-CM | POA: Diagnosis not present

## 2021-07-14 DIAGNOSIS — L570 Actinic keratosis: Secondary | ICD-10-CM | POA: Diagnosis not present

## 2021-07-14 DIAGNOSIS — C44321 Squamous cell carcinoma of skin of nose: Secondary | ICD-10-CM | POA: Diagnosis not present

## 2021-07-14 DIAGNOSIS — L821 Other seborrheic keratosis: Secondary | ICD-10-CM | POA: Diagnosis not present

## 2021-07-14 DIAGNOSIS — L814 Other melanin hyperpigmentation: Secondary | ICD-10-CM | POA: Diagnosis not present

## 2021-07-14 DIAGNOSIS — Z85828 Personal history of other malignant neoplasm of skin: Secondary | ICD-10-CM | POA: Diagnosis not present

## 2021-07-24 DIAGNOSIS — C44321 Squamous cell carcinoma of skin of nose: Secondary | ICD-10-CM | POA: Diagnosis not present

## 2021-08-02 DIAGNOSIS — R350 Frequency of micturition: Secondary | ICD-10-CM | POA: Diagnosis not present

## 2021-09-27 DIAGNOSIS — C44519 Basal cell carcinoma of skin of other part of trunk: Secondary | ICD-10-CM | POA: Diagnosis not present

## 2021-09-27 DIAGNOSIS — H401111 Primary open-angle glaucoma, right eye, mild stage: Secondary | ICD-10-CM | POA: Diagnosis not present

## 2021-09-27 DIAGNOSIS — L821 Other seborrheic keratosis: Secondary | ICD-10-CM | POA: Diagnosis not present

## 2021-09-27 DIAGNOSIS — D485 Neoplasm of uncertain behavior of skin: Secondary | ICD-10-CM | POA: Diagnosis not present

## 2021-09-27 DIAGNOSIS — L814 Other melanin hyperpigmentation: Secondary | ICD-10-CM | POA: Diagnosis not present

## 2021-09-27 DIAGNOSIS — L570 Actinic keratosis: Secondary | ICD-10-CM | POA: Diagnosis not present

## 2021-09-27 DIAGNOSIS — H5212 Myopia, left eye: Secondary | ICD-10-CM | POA: Diagnosis not present

## 2021-09-27 DIAGNOSIS — D0461 Carcinoma in situ of skin of right upper limb, including shoulder: Secondary | ICD-10-CM | POA: Diagnosis not present

## 2021-09-27 DIAGNOSIS — L820 Inflamed seborrheic keratosis: Secondary | ICD-10-CM | POA: Diagnosis not present

## 2021-09-27 DIAGNOSIS — H353131 Nonexudative age-related macular degeneration, bilateral, early dry stage: Secondary | ICD-10-CM | POA: Diagnosis not present

## 2021-09-27 DIAGNOSIS — Z85828 Personal history of other malignant neoplasm of skin: Secondary | ICD-10-CM | POA: Diagnosis not present

## 2021-09-27 DIAGNOSIS — Z961 Presence of intraocular lens: Secondary | ICD-10-CM | POA: Diagnosis not present

## 2021-10-17 DIAGNOSIS — J069 Acute upper respiratory infection, unspecified: Secondary | ICD-10-CM | POA: Diagnosis not present

## 2022-01-31 DIAGNOSIS — K648 Other hemorrhoids: Secondary | ICD-10-CM | POA: Diagnosis not present

## 2022-01-31 DIAGNOSIS — D125 Benign neoplasm of sigmoid colon: Secondary | ICD-10-CM | POA: Diagnosis not present

## 2022-01-31 DIAGNOSIS — K5730 Diverticulosis of large intestine without perforation or abscess without bleeding: Secondary | ICD-10-CM | POA: Diagnosis not present

## 2022-01-31 DIAGNOSIS — D123 Benign neoplasm of transverse colon: Secondary | ICD-10-CM | POA: Diagnosis not present

## 2022-01-31 DIAGNOSIS — Z86010 Personal history of colonic polyps: Secondary | ICD-10-CM | POA: Diagnosis not present

## 2022-02-02 DIAGNOSIS — D125 Benign neoplasm of sigmoid colon: Secondary | ICD-10-CM | POA: Diagnosis not present

## 2022-02-02 DIAGNOSIS — D123 Benign neoplasm of transverse colon: Secondary | ICD-10-CM | POA: Diagnosis not present

## 2022-02-21 DIAGNOSIS — L814 Other melanin hyperpigmentation: Secondary | ICD-10-CM | POA: Diagnosis not present

## 2022-02-21 DIAGNOSIS — D0461 Carcinoma in situ of skin of right upper limb, including shoulder: Secondary | ICD-10-CM | POA: Diagnosis not present

## 2022-02-21 DIAGNOSIS — Z85828 Personal history of other malignant neoplasm of skin: Secondary | ICD-10-CM | POA: Diagnosis not present

## 2022-02-21 DIAGNOSIS — L570 Actinic keratosis: Secondary | ICD-10-CM | POA: Diagnosis not present

## 2022-02-21 DIAGNOSIS — L820 Inflamed seborrheic keratosis: Secondary | ICD-10-CM | POA: Diagnosis not present

## 2022-02-21 DIAGNOSIS — L821 Other seborrheic keratosis: Secondary | ICD-10-CM | POA: Diagnosis not present

## 2022-03-12 IMAGING — CR DG CHEST 2V
2 series · 2 of 2 positions shown · non-contrast
Comparison: 08/08/2020

CLINICAL DATA: History of COVID

EXAM:
CHEST - 2 VIEW

[w chest pa]
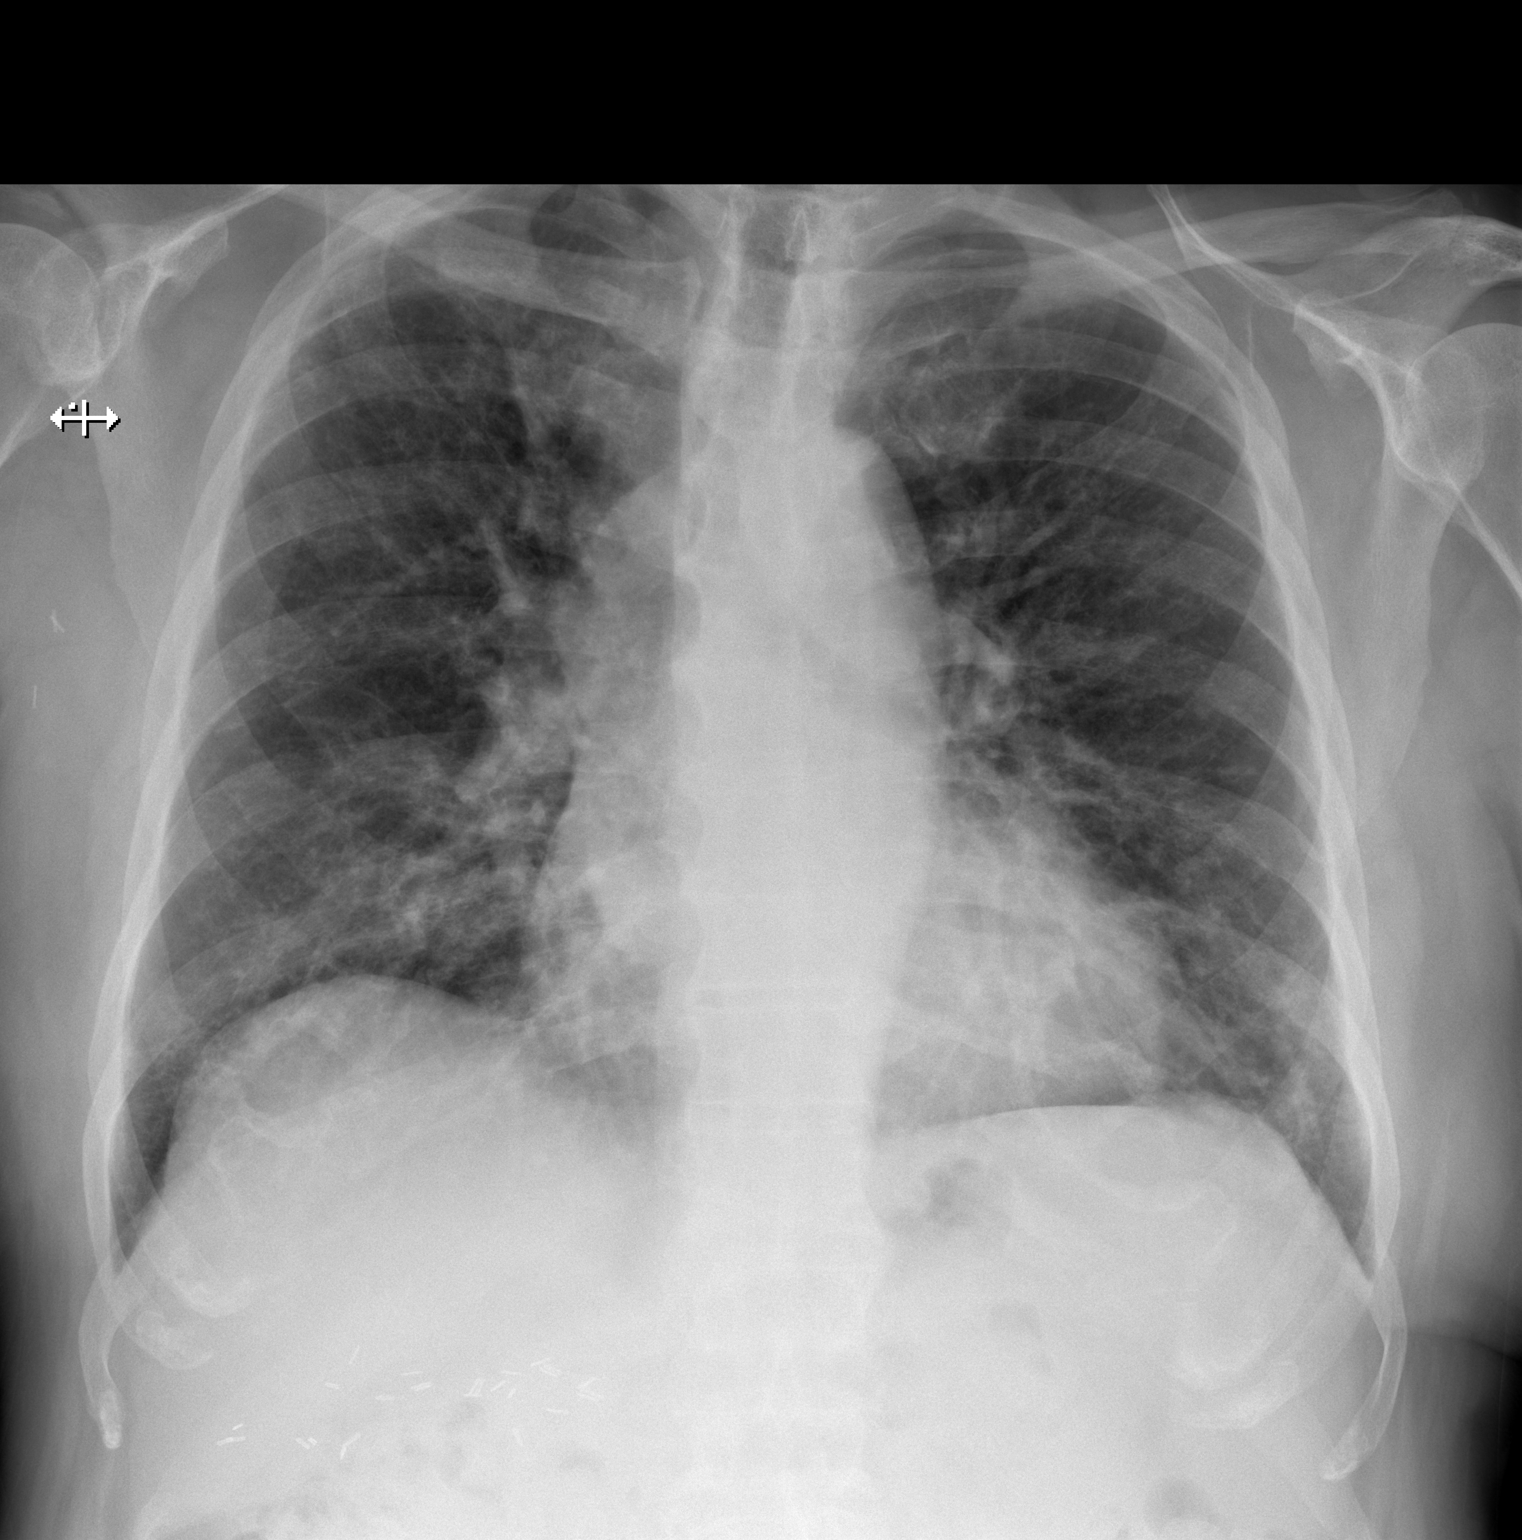

[w chest lat]
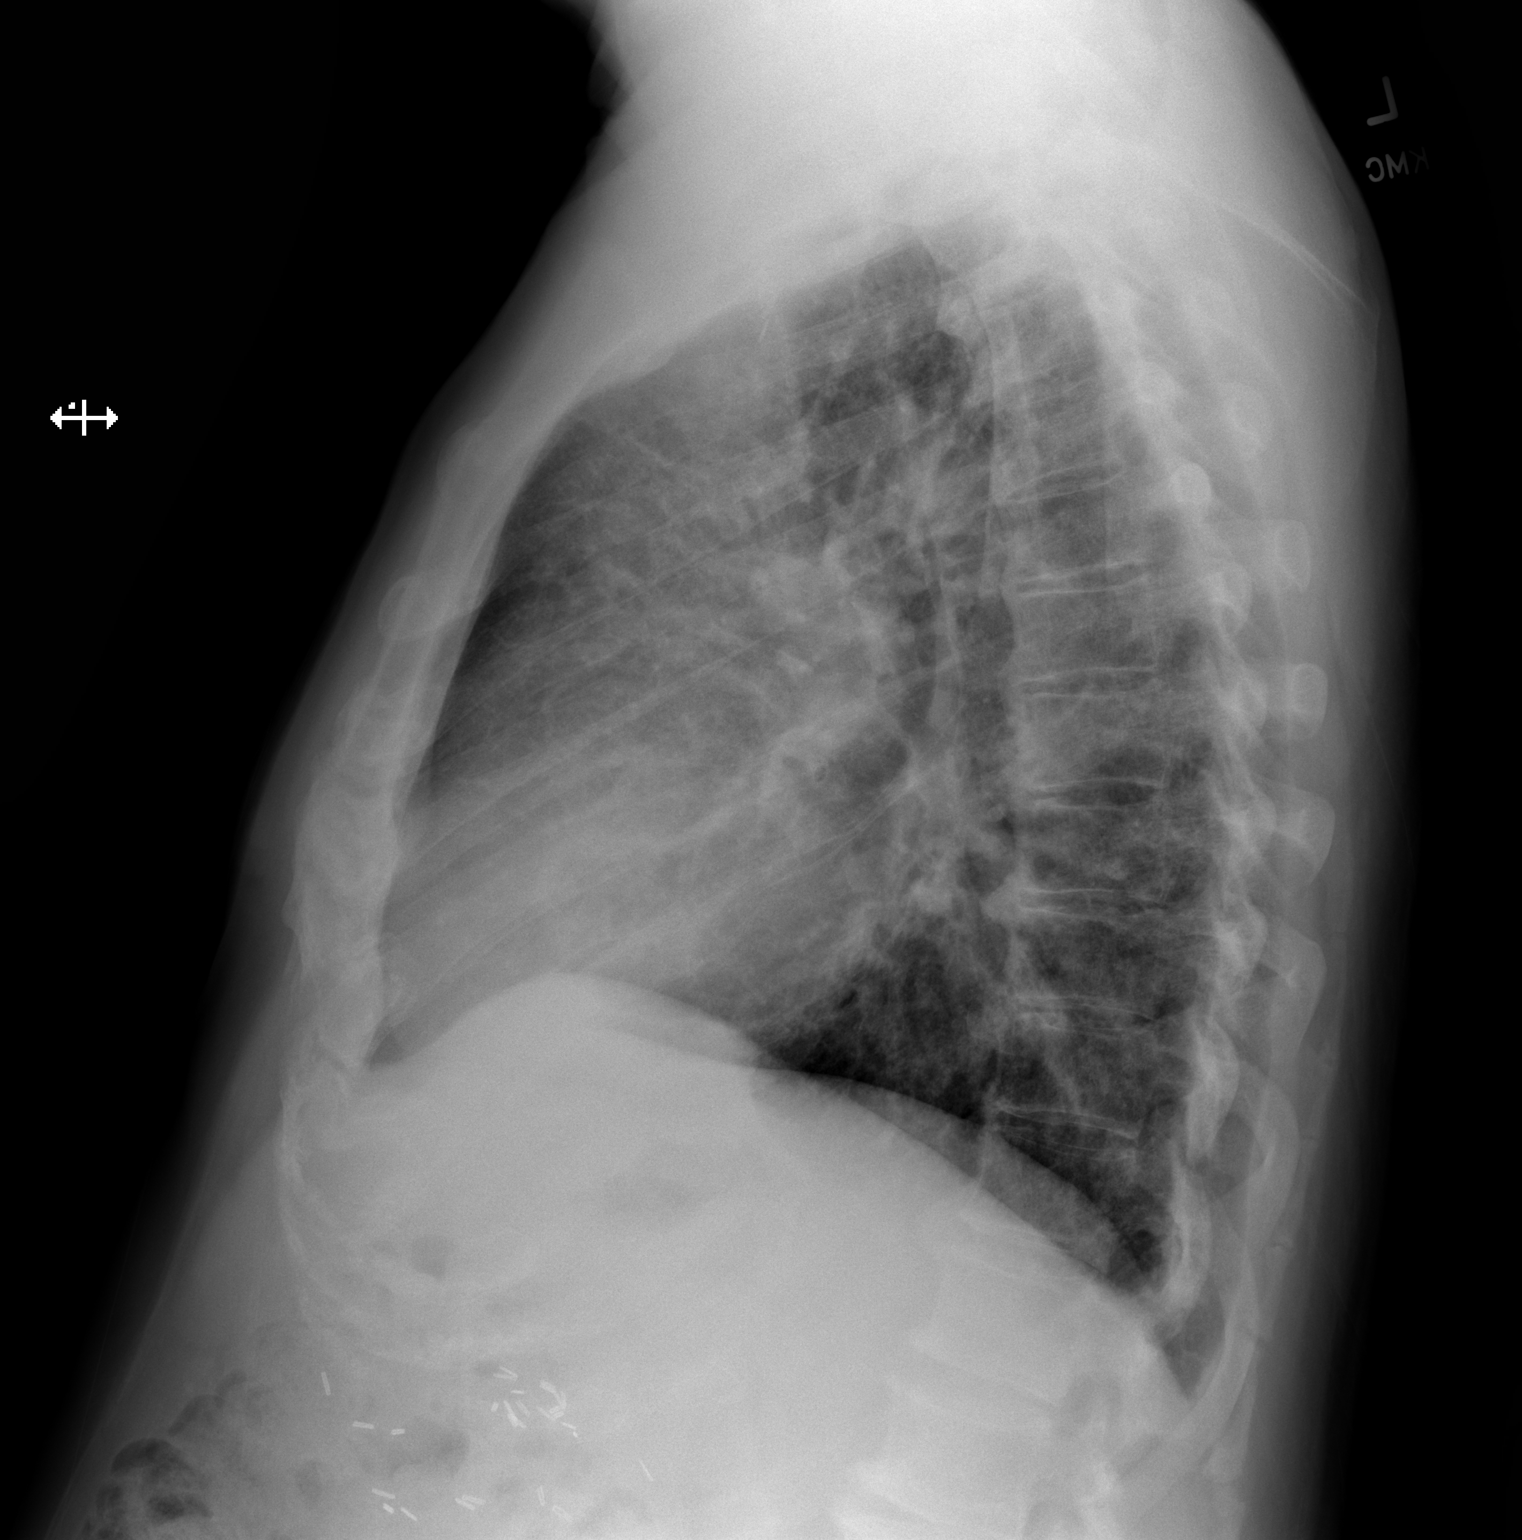

[2 of 2 positions shown; findings below may reference images not displayed]

FINDINGS: Mild residual streaky basilar opacity, suspect post infectious
scarring. No consolidation or effusion. Stable cardiomediastinal
silhouette. No pneumothorax.
IMPRESSION: Mild residual streaky basilar opacity, suspect post infectious
scarring.

## 2022-03-28 DIAGNOSIS — H401123 Primary open-angle glaucoma, left eye, severe stage: Secondary | ICD-10-CM | POA: Diagnosis not present

## 2022-06-27 DIAGNOSIS — E559 Vitamin D deficiency, unspecified: Secondary | ICD-10-CM | POA: Diagnosis not present

## 2022-06-27 DIAGNOSIS — I10 Essential (primary) hypertension: Secondary | ICD-10-CM | POA: Diagnosis not present

## 2022-06-27 DIAGNOSIS — R7309 Other abnormal glucose: Secondary | ICD-10-CM | POA: Diagnosis not present

## 2022-07-03 DIAGNOSIS — Z85828 Personal history of other malignant neoplasm of skin: Secondary | ICD-10-CM | POA: Diagnosis not present

## 2022-07-03 DIAGNOSIS — I10 Essential (primary) hypertension: Secondary | ICD-10-CM | POA: Diagnosis not present

## 2022-07-03 DIAGNOSIS — E559 Vitamin D deficiency, unspecified: Secondary | ICD-10-CM | POA: Diagnosis not present

## 2022-07-03 DIAGNOSIS — Z0000 Encounter for general adult medical examination without abnormal findings: Secondary | ICD-10-CM | POA: Diagnosis not present

## 2022-07-03 DIAGNOSIS — H409 Unspecified glaucoma: Secondary | ICD-10-CM | POA: Diagnosis not present

## 2022-07-03 DIAGNOSIS — R7309 Other abnormal glucose: Secondary | ICD-10-CM | POA: Diagnosis not present

## 2022-07-04 DIAGNOSIS — D045 Carcinoma in situ of skin of trunk: Secondary | ICD-10-CM | POA: Diagnosis not present

## 2022-07-04 DIAGNOSIS — C44619 Basal cell carcinoma of skin of left upper limb, including shoulder: Secondary | ICD-10-CM | POA: Diagnosis not present

## 2022-07-04 DIAGNOSIS — L814 Other melanin hyperpigmentation: Secondary | ICD-10-CM | POA: Diagnosis not present

## 2022-07-04 DIAGNOSIS — C44329 Squamous cell carcinoma of skin of other parts of face: Secondary | ICD-10-CM | POA: Diagnosis not present

## 2022-07-04 DIAGNOSIS — Z85828 Personal history of other malignant neoplasm of skin: Secondary | ICD-10-CM | POA: Diagnosis not present

## 2022-07-04 DIAGNOSIS — L308 Other specified dermatitis: Secondary | ICD-10-CM | POA: Diagnosis not present

## 2022-07-04 DIAGNOSIS — L821 Other seborrheic keratosis: Secondary | ICD-10-CM | POA: Diagnosis not present

## 2022-07-04 DIAGNOSIS — L570 Actinic keratosis: Secondary | ICD-10-CM | POA: Diagnosis not present

## 2022-07-04 DIAGNOSIS — D0439 Carcinoma in situ of skin of other parts of face: Secondary | ICD-10-CM | POA: Diagnosis not present

## 2022-07-25 DIAGNOSIS — C44329 Squamous cell carcinoma of skin of other parts of face: Secondary | ICD-10-CM | POA: Diagnosis not present

## 2022-07-25 DIAGNOSIS — D0439 Carcinoma in situ of skin of other parts of face: Secondary | ICD-10-CM | POA: Diagnosis not present

## 2022-08-08 DIAGNOSIS — R350 Frequency of micturition: Secondary | ICD-10-CM | POA: Diagnosis not present

## 2022-10-02 DIAGNOSIS — R220 Localized swelling, mass and lump, head: Secondary | ICD-10-CM | POA: Diagnosis not present

## 2022-10-10 ENCOUNTER — Other Ambulatory Visit (HOSPITAL_COMMUNITY): Payer: Self-pay | Admitting: Family Medicine

## 2022-10-10 DIAGNOSIS — R220 Localized swelling, mass and lump, head: Secondary | ICD-10-CM

## 2022-10-18 ENCOUNTER — Ambulatory Visit (HOSPITAL_COMMUNITY): Payer: Medicare HMO

## 2022-10-18 ENCOUNTER — Encounter (HOSPITAL_COMMUNITY): Payer: Self-pay

## 2022-10-25 ENCOUNTER — Ambulatory Visit (HOSPITAL_COMMUNITY)
Admission: RE | Admit: 2022-10-25 | Discharge: 2022-10-25 | Disposition: A | Discharge status: Home / Self Care | Payer: Medicare HMO | Source: Ambulatory Visit | Attending: Family Medicine | Admitting: Family Medicine

## 2022-10-25 DIAGNOSIS — R220 Localized swelling, mass and lump, head: Secondary | ICD-10-CM | POA: Insufficient documentation

## 2022-10-25 DIAGNOSIS — I6529 Occlusion and stenosis of unspecified carotid artery: Secondary | ICD-10-CM | POA: Diagnosis not present

## 2022-10-25 DIAGNOSIS — I771 Stricture of artery: Secondary | ICD-10-CM | POA: Diagnosis not present

## 2022-10-25 DIAGNOSIS — M47812 Spondylosis without myelopathy or radiculopathy, cervical region: Secondary | ICD-10-CM | POA: Diagnosis not present

## 2022-10-25 DIAGNOSIS — C779 Secondary and unspecified malignant neoplasm of lymph node, unspecified: Secondary | ICD-10-CM | POA: Diagnosis not present

## 2022-10-25 MED ORDER — IOHEXOL 300 MG/ML  SOLN
75.0000 mL | Freq: Once | INTRAMUSCULAR | Status: AC | PRN
  Administered 2022-10-25: 75 mL via INTRAVENOUS

## 2022-10-25 MED ORDER — SODIUM CHLORIDE (PF) 0.9 % IJ SOLN
INTRAMUSCULAR | Status: AC
  Filled 2022-10-25: qty 50

## 2022-10-30 DIAGNOSIS — H52203 Unspecified astigmatism, bilateral: Secondary | ICD-10-CM | POA: Diagnosis not present

## 2022-10-30 DIAGNOSIS — H401123 Primary open-angle glaucoma, left eye, severe stage: Secondary | ICD-10-CM | POA: Diagnosis not present

## 2022-10-30 DIAGNOSIS — H26492 Other secondary cataract, left eye: Secondary | ICD-10-CM | POA: Diagnosis not present

## 2022-10-30 DIAGNOSIS — H401111 Primary open-angle glaucoma, right eye, mild stage: Secondary | ICD-10-CM | POA: Diagnosis not present

## 2022-10-31 ENCOUNTER — Other Ambulatory Visit (HOSPITAL_COMMUNITY): Payer: Self-pay | Admitting: Family Medicine

## 2022-10-31 DIAGNOSIS — R221 Localized swelling, mass and lump, neck: Secondary | ICD-10-CM

## 2022-11-01 ENCOUNTER — Inpatient Hospital Stay: Payer: Medicare HMO

## 2022-11-01 ENCOUNTER — Encounter: Payer: Self-pay | Admitting: Physician Assistant

## 2022-11-01 ENCOUNTER — Inpatient Hospital Stay: Payer: Medicare HMO | Attending: Physician Assistant | Admitting: Physician Assistant

## 2022-11-01 ENCOUNTER — Other Ambulatory Visit: Payer: Self-pay

## 2022-11-01 VITALS — BP 166/77 | HR 60 | Temp 98.1°F | Resp 15 | Wt 199.3 lb

## 2022-11-01 DIAGNOSIS — R220 Localized swelling, mass and lump, head: Secondary | ICD-10-CM | POA: Diagnosis not present

## 2022-11-01 DIAGNOSIS — R221 Localized swelling, mass and lump, neck: Secondary | ICD-10-CM | POA: Diagnosis not present

## 2022-11-01 DIAGNOSIS — Z79899 Other long term (current) drug therapy: Secondary | ICD-10-CM | POA: Insufficient documentation

## 2022-11-01 LAB — CBC WITH DIFFERENTIAL (CANCER CENTER ONLY)
Abs Immature Granulocytes: 0.01 10*3/uL (ref 0.00–0.07)
Basophils Absolute: 0 10*3/uL (ref 0.0–0.1)
Basophils Relative: 0 %
Eosinophils Absolute: 0.2 10*3/uL (ref 0.0–0.5)
Eosinophils Relative: 3 %
HCT: 43.5 % (ref 39.0–52.0)
Hemoglobin: 15 g/dL (ref 13.0–17.0)
Immature Granulocytes: 0 %
Lymphocytes Relative: 16 %
Lymphs Abs: 1 10*3/uL (ref 0.7–4.0)
MCH: 30.9 pg (ref 26.0–34.0)
MCHC: 34.5 g/dL (ref 30.0–36.0)
MCV: 89.5 fL (ref 80.0–100.0)
Monocytes Absolute: 0.7 10*3/uL (ref 0.1–1.0)
Monocytes Relative: 11 %
Neutro Abs: 4.2 10*3/uL (ref 1.7–7.7)
Neutrophils Relative %: 70 %
Platelet Count: 123 10*3/uL — ABNORMAL LOW (ref 150–400)
RBC: 4.86 MIL/uL (ref 4.22–5.81)
RDW: 13.2 % (ref 11.5–15.5)
Smear Review: NORMAL
WBC Count: 6.1 10*3/uL (ref 4.0–10.5)
nRBC: 0 % (ref 0.0–0.2)

## 2022-11-01 LAB — CMP (CANCER CENTER ONLY)
ALT: 16 U/L (ref 0–44)
AST: 17 U/L (ref 15–41)
Albumin: 3.7 g/dL (ref 3.5–5.0)
Alkaline Phosphatase: 57 U/L (ref 38–126)
Anion gap: 4 — ABNORMAL LOW (ref 5–15)
BUN: 18 mg/dL (ref 8–23)
CO2: 27 mmol/L (ref 22–32)
Calcium: 9.7 mg/dL (ref 8.9–10.3)
Chloride: 110 mmol/L (ref 98–111)
Creatinine: 0.91 mg/dL (ref 0.61–1.24)
GFR, Estimated: >60 mL/min (ref >60)
Glucose, Bld: 90 mg/dL (ref 70–99)
Potassium: 4.2 mmol/L (ref 3.5–5.1)
Sodium: 141 mmol/L (ref 135–145)
Total Bilirubin: 1.1 mg/dL (ref 0.3–1.2)
Total Protein: 6.9 g/dL (ref 6.5–8.1)

## 2022-11-01 LAB — LACTATE DEHYDROGENASE: LDH: 137 U/L (ref 98–192)

## 2022-11-01 NOTE — Progress Notes (Signed)
Corrie Mckusick, DO  Donita Brooks D [place] for US guided right neck mass biopsy.  Earleen Newport

## 2022-11-01 NOTE — Progress Notes (Signed)
[place] [phone]   [phone]  INITIAL CONSULTATION:  Patient Care Team: London Pepper, MD as PCP - General (Family Medicine)  CHIEF COMPLAINTS/PURPOSE OF CONSULTATION:  Right submandibular mass  ONCOLOGIC HISTORY: Presented with progressive swelling in his right jaw that starting December 2023. 10/25/2022: CT neck: Right submandibular space mass, 3.2 cm, spiculated, and heterogeneous.No associated right mandible bone lesion. But nonspecific sclerotic bone lesion of the right sphenoid wing, skull base. No other metastatic disease identified in the neck or upper chest. 11/01/2022: Establish care with [facility] Hematology/Oncology  HISTORY OF PRESENTING ILLNESS:  VERYL WINEMILLER 82 y.o. male with medical history significant for glaucoma, hypertension, melanoma s/p excision, BPH and arthritis. He presents to the clinic for evaluation of right submandibular mass. He is unaccompanied for this visit.   On exam today, Mr. Tapp reports the swelling of his right submandibular mass has increased in the last 2 weeks. The mass is not painful and does not interfere with his ADLs. He denies any difficulty with eating or swallowing. He reports his energy levels are stable. He denies any appetite or weight changes. He denies nausea, vomiting or abdominal pain. Bowel habits are unchanged without recurrent episodes of diarrhea or constipation. He denies easy bruising or signs of active bleeding. He denies fevers, chills, sweats, shortness of breath, chest pain, cough, headaches or dizziness. He has no other complaints. Rest of the 10 point ROS is below.   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   Procedure: LEFT TOTAL HIP ARTHROPLASTY ANTERIOR APPROACH;  Surgeon: Gaynelle Arabian, MD;  Location: [facility];  Service: Orthopedics;  Laterality: Left;    SOCIAL HISTORY: Social History   Socioeconomic History   Marital status: Married    Spouse name: Not on file   Number of children: Not on file   Years of education: Not on file   Highest education level: Not on file  Occupational History   Not on file  Tobacco Use   Smoking status: Former    Packs/day: 1.50    Years: 10.00    Total pack years: 15.00    Types: Cigarettes   Smokeless tobacco: Never  Substance and Sexual Activity   Alcohol use: Yes    Comment: 1-2 glass wine per day   Drug use: No   Sexual activity: Not on file  Other Topics Concern   Not on file  Social History Narrative   Not on file   Social Determinants of Health   Financial Resource Strain: Not on file  Food Insecurity: Not on file  Transportation Needs: Not on file  Physical Activity: Not on file  Stress: Not on file  Social Connections: Not on file  Intimate Partner Violence: Not on file    FAMILY HISTORY: No family history on file.  ALLERGIES:  has No Known Allergies.  MEDICATIONS:  Current Outpatient Medications  Medication Sig Dispense Refill   amLODipine (NORVASC) 5 MG tablet Take 5 mg  by mouth daily.     aspirin 325 MG EC tablet Take 325 mg by mouth every 4 (four) hours as needed for pain.      cholecalciferol (VITAMIN D) 1000 units tablet Take 1,000 Units by mouth as directed. Twice weekly     COVID-19 mRNA Vac-TriS, Pfizer, (PFIZER-BIONT COVID-19 VAC-TRIS) SUSP injection Inject into the muscle. 0.3 mL 0   dorzolamide-timolol (COSOPT) 22.3-6.8 MG/ML ophthalmic solution Place 1 drop into both eyes 2 (two) times daily.     finasteride (PROSCAR) 5 MG tablet Take 5 mg by mouth daily.      guaiFENesin (MUCINEX) 600 MG 12 hr tablet Take 600 mg by mouth 2 (two) times daily as needed for to loosen phlegm.     guaifenesin (ROBITUSSIN) 100 MG/5ML syrup Take 200 mg by mouth 3 (three) times daily as needed for cough.     latanoprost (XALATAN) 0.005 % ophthalmic solution Place 1 drop into both eyes at bedtime.     No current facility-administered medications for this visit.    REVIEW OF SYSTEMS:   Constitutional: ( - ) fevers, ( - )  chills , ( - ) night sweats Eyes: ( - ) blurriness of vision, ( - ) double vision, ( - ) watery eyes Ears, nose, mouth, throat, and face: ( - ) mucositis, ( - ) sore throat Respiratory: ( - ) cough, ( - ) dyspnea, ( - ) wheezes Cardiovascular: ( - ) palpitation, ( - ) chest discomfort, ( - ) lower extremity swelling Gastrointestinal:  ( - ) nausea, ( - ) heartburn, ( - ) change in bowel habits Skin: ( - ) abnormal skin rashes Lymphatics: ( - ) new lymphadenopathy, ( - ) easy bruising Neurological: ( - ) numbness, ( - ) tingling, ( - ) new weaknesses Behavioral/Psych: ( - ) mood change, ( - ) new changes  All other systems were reviewed with the patient and are negative.  PHYSICAL EXAMINATION: ECOG PERFORMANCE STATUS: 1 - Symptomatic but completely ambulatory  Vitals:   11/01/22 0902  BP: (!) 166/77  Pulse: 60  Resp: 15  Temp: 98.1 F (36.7 C)  SpO2: 98%   Filed Weights   11/01/22 0902  Weight: 199 lb 4.8 oz (90.4 kg)    GENERAL: well appearing male in NAD  SKIN: skin color, texture, turgor are normal, no rashes or significant lesions EYES: conjunctiva are pink and non-injected, sclera clear OROPHARYNX: no exudate, no erythema; lips, buccal mucosa, and tongue normal +firm, nontender palpable mass of the right submandibular region with some swelling in his corresponding oral cavity NECK: supple, non-tender LYMPH:  no palpable lymphadenopathy in the cervical, axillary or supraclavicular lymph nodes.  LUNGS: clear to auscultation and  percussion with normal breathing effort HEART: regular rate & rhythm and no murmurs and no lower extremity edema ABDOMEN: soft, non-tender, non-distended, normal bowel sounds Musculoskeletal: no cyanosis of digits and no clubbing  PSYCH: alert & oriented x 3, fluent speech NEURO: no focal motor/sensory deficits  LABORATORY DATA:  I have reviewed the data as listed    Latest Ref Rng & Units 06/17/2018    4:57 AM 11/02/2016    4:15 AM 11/01/2016    4:30 AM  CBC  WBC 4.0 - 10.5 K/uL 9.6  13.6  9.4   Hemoglobin 13.0 - 17.0 g/dL 15.2  12.9  13.8   Hematocrit 39.0 - 52.0 % 45.3  38.2  40.6   Platelets 150 - 400 K/uL 129  147  136  Latest Ref Rng & Units 06/17/2018    4:57 AM 11/02/2016    4:15 AM 11/01/2016    4:30 AM  CMP  Glucose 70 - 99 mg/dL 141  153  158   BUN 8 - 23 mg/dL '14  17  13   '$ Creatinine 0.61 - 1.24 mg/dL 0.88  0.82  0.88   Sodium 135 - 145 mmol/L 138  139  138   Potassium 3.5 - 5.1 mmol/L 4.3  4.1  4.1   Chloride 98 - 111 mmol/L 104  109  107   CO2 22 - 32 mmol/L '26  25  25   '$ Calcium 8.9 - 10.3 mg/dL 9.1  8.6  8.5   Total Protein 6.5 - 8.1 g/dL 7.3     Total Bilirubin 0.3 - 1.2 mg/dL 1.9     Alkaline Phos 38 - 126 U/L 48     AST 15 - 41 U/L 22     ALT 0 - 44 U/L 24        RADIOGRAPHIC STUDIES: I have personally reviewed the radiological images as listed and agreed with the findings in the report. CT SOFT TISSUE NECK W CONTRAST  Result Date: 10/26/2022 CLINICAL DATA:  82 year old male with right jaw region swelling which started in December. Progression in the past 2 weeks. EXAM: CT NECK WITH CONTRAST TECHNIQUE: Multidetector CT imaging of the neck was performed using the standard protocol following the bolus administration of intravenous contrast. RADIATION DOSE REDUCTION: This exam was performed according to the departmental dose-optimization program which includes automated exposure control, adjustment of the mA and/or kV according to patient size and/or use of  iterative reconstruction technique. CONTRAST:  56m OMNIPAQUE IOHEXOL 300 MG/ML  SOLN COMPARISON:  None Available. FINDINGS: Pharynx and larynx: Larynx appears symmetric and normal, including the epiglottis. Pharyngeal soft tissue contours remain within normal limits. There is questionable subtle increased enhancement at the right base of tongue on series 2, image 60, but this is not convincing on the sagittal or coronal images. Parapharyngeal and retropharyngeal spaces are negative. Salivary glands: Dental streak artifact. No oral tongue or oral cavity mass is identified, and the sublingual space appears to remain negative. However, there is a large, heterogeneous, and irregular malignant appearing right level 1 B soft tissue mass beneath the body of the right (up to 3 cm long axis) mandible tracking toward the skin surface and corresponding to the marked area of palpable concern series 2, image 73. This might be a malignant lymph node with extracapsular extension, but is inseparable from the more medially positioned right submandibular gland on series 2, image 73. Contralateral left submandibular gland appears normal except there is an abnormal enlarged and heterogeneous left level 1 B lymph node also which is 11 mm. See additional details below. Parotid glands are negative. Thyroid: Diminutive, negative. Lymph nodes: Large spiculated and heterogeneous 3.2 cm long axis soft tissue mass in the right submandibular space bulging or tracking through the platysma and toward the skin surface. This is the palpable abnormality, and as described above might be a malignant lymph node with extracapsular extension. Contralateral similar abnormally enlarged and heterogeneous left level 1 B lymph node (series 2, image 67). However, no level 1 a lymphadenopathy. No level 2 or level 3 cervical lymphadenopathy. Level 4 and 5 nodal stations also appear within normal limits. Vascular: Major vascular structures in the neck and at the  skull base appear to be patent, suboptimal intravascular contrast bolus. Mild for age cervical carotid  atherosclerosis. Tortuous right ICA below the skull base. Limited intracranial: Negative for age visible brain parenchyma. Calcified atherosclerosis at the skull base. Visualized orbits: Postoperative changes to both globes, otherwise negative. Mastoids and visualized paranasal sinuses: Minimal paranasal sinus mucosal thickening. Tympanic cavities and mastoids are well aerated. Skeleton: Right mandible remains intact and does not appear invaded by the submandibular soft tissue mass. No mandible bone lesion identified. Extensive cervical spine degeneration including developing degenerative appearing interbody ankylosis C5-C6 and C6-C7. There is asymmetric sclerosis of the right lateral sphenoid wing, temporal bone articular fossa and posterior zygoma. See coronal image 60. this could be sclerotic metastatic disease, but benign fibrous dysplasia might also have this appearance. No other suspicious sclerotic bone lesion identified in the neck. Upper chest: Apical lung scarring greater on the right. Negative visible thoracic inlet. IMPRESSION: 1. Right submandibular space mass, 3.2 cm, spiculated, and heterogeneous. 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 But no obvious primary tongue or pharyngeal primary tumor identified (questionable heterogeneity at the right base of tongue), and alternatively an exophytic tumor of the right submandibular gland is difficult to exclude. Ultrasound Guided Core Needle Biopsy of the right submandibular lesion for histologic correlation may be the most valuable next step. 2. No associated right mandible bone lesion. But nonspecific sclerotic bone lesion of the right sphenoid wing, skull base. Differential considerations are sclerotic bone metastasis  versus benign etiology such as fibrous dysplasia (favored). 3. No other metastatic disease identified in the neck or upper chest. Electronically Signed   By: Genevie Ann M.D.   On: 10/26/2022 11:01    ASSESSMENT & PLAN TYEE VANDEVOORDE is a 82 y.o. male who presents to the clinic for evaluation of new right submandibular mass.   #Right submandibular mass: --Etiologies including underlying head/neck malignancy versus lymphoproliferative process.  --Labs today to check CBC, CMP and LDH levels --Order placed for core needle biopsy of submandibular mass --Likely need PET scan for staging once we confirm tissue diagnosis --RTC once workup is complete   No orders of the defined types were placed in this encounter.   All questions were answered. The patient knows to call the clinic with any problems, questions or concerns.  I have spent a total of 60 minutes minutes of face-to-face and non-face-to-face time, preparing to see the patient, obtaining and/or reviewing separately obtained history, performing a medically appropriate examination, counseling and educating the patient, ordering tests/procedures,documenting clinical information in the electronic health record,  and care coordination.   Dede Query, PA-C Department of Hematology/Oncology [place] [facility] Phone: [phone]  Patient was seen with Dr. Lorenso Courier  I have read the above note and personally examined the patient. I agree with the assessment and plan as noted above.  Briefly Mr. Angelgabriel Willmore is an 82 year old male who presents for evaluation of a submandibular mass.  He first noticed this mass in December 2023.  Since that time it has steadily increased in size.  He underwent imaging on 10/25/2022 with a CT scan of the neck which showed a right submandibular space mass, 3.2 cm which was spiculated and heterogeneous.  It was favored to be a malignant lymph node.  Due to concern for these findings he was  referred to oncology for further evaluation and management.  At this time findings are concerning for a malignancy, potentially head neck cancer  versus lymphoma.  Would recommend an ultrasound-guided core biopsy of the lesion for further evaluation.  The patient voiced understanding of our plan moving forward.   Ledell Peoples, MD Department of Hematology/Oncology [place] at [facility] Phone: [phone] Pager: [phone] Email: [email or web address]

## 2022-11-02 ENCOUNTER — Telehealth: Payer: Self-pay

## 2022-11-02 NOTE — Telephone Encounter (Signed)
Pt advised with VU 

## 2022-11-02 NOTE — Telephone Encounter (Signed)
-----  Message from Lincoln Brigham, PA-C sent at 11/01/2022  4:05 PM EST ----- Please notify patient that labs from today were reviewed and require no intervention.    ----- Message ----- From: Interface, Lab In [place] Sent: 11/01/2022  10:41 AM EST To: Lincoln Brigham, PA-C

## 2022-11-12 ENCOUNTER — Ambulatory Visit (HOSPITAL_COMMUNITY)
Admission: RE | Admit: 2022-11-12 | Discharge: 2022-11-12 | Disposition: A | Discharge status: Home / Self Care | Payer: Medicare HMO | Source: Ambulatory Visit | Attending: Family Medicine | Admitting: Family Medicine

## 2022-11-12 ENCOUNTER — Other Ambulatory Visit: Payer: Self-pay

## 2022-11-12 ENCOUNTER — Encounter (HOSPITAL_COMMUNITY): Payer: Self-pay

## 2022-11-12 VITALS — BP 166/85 | HR 60 | Temp 97.3°F | Resp 16

## 2022-11-12 DIAGNOSIS — Z87891 Personal history of nicotine dependence: Secondary | ICD-10-CM | POA: Diagnosis not present

## 2022-11-12 DIAGNOSIS — R590 Localized enlarged lymph nodes: Secondary | ICD-10-CM | POA: Diagnosis not present

## 2022-11-12 DIAGNOSIS — M1990 Unspecified osteoarthritis, unspecified site: Secondary | ICD-10-CM | POA: Diagnosis not present

## 2022-11-12 DIAGNOSIS — M167 Other unilateral secondary osteoarthritis of hip: Secondary | ICD-10-CM | POA: Diagnosis not present

## 2022-11-12 DIAGNOSIS — Z85820 Personal history of malignant melanoma of skin: Secondary | ICD-10-CM | POA: Diagnosis not present

## 2022-11-12 DIAGNOSIS — I6529 Occlusion and stenosis of unspecified carotid artery: Secondary | ICD-10-CM | POA: Insufficient documentation

## 2022-11-12 DIAGNOSIS — H409 Unspecified glaucoma: Secondary | ICD-10-CM | POA: Diagnosis not present

## 2022-11-12 DIAGNOSIS — Z85828 Personal history of other malignant neoplasm of skin: Secondary | ICD-10-CM | POA: Diagnosis not present

## 2022-11-12 DIAGNOSIS — N400 Enlarged prostate without lower urinary tract symptoms: Secondary | ICD-10-CM | POA: Diagnosis not present

## 2022-11-12 DIAGNOSIS — C770 Secondary and unspecified malignant neoplasm of lymph nodes of head, face and neck: Secondary | ICD-10-CM | POA: Diagnosis not present

## 2022-11-12 DIAGNOSIS — C779 Secondary and unspecified malignant neoplasm of lymph node, unspecified: Secondary | ICD-10-CM | POA: Diagnosis not present

## 2022-11-12 DIAGNOSIS — C801 Malignant (primary) neoplasm, unspecified: Secondary | ICD-10-CM | POA: Diagnosis not present

## 2022-11-12 DIAGNOSIS — I10 Essential (primary) hypertension: Secondary | ICD-10-CM | POA: Diagnosis not present

## 2022-11-12 DIAGNOSIS — R221 Localized swelling, mass and lump, neck: Secondary | ICD-10-CM | POA: Insufficient documentation

## 2022-11-12 DIAGNOSIS — J984 Other disorders of lung: Secondary | ICD-10-CM | POA: Diagnosis not present

## 2022-11-12 DIAGNOSIS — C4442 Squamous cell carcinoma of skin of scalp and neck: Secondary | ICD-10-CM | POA: Diagnosis not present

## 2022-11-12 LAB — PROTIME-INR
INR: 1.2 (ref 0.8–1.2)
Prothrombin Time: 14.6 seconds (ref 11.4–15.2)

## 2022-11-12 LAB — CBC WITH DIFFERENTIAL/PLATELET
Abs Immature Granulocytes: 0.03 10*3/uL (ref 0.00–0.07)
Basophils Absolute: 0 10*3/uL (ref 0.0–0.1)
Basophils Relative: 0 %
Eosinophils Absolute: 0.2 10*3/uL (ref 0.0–0.5)
Eosinophils Relative: 3 %
HCT: 44.3 % (ref 39.0–52.0)
Hemoglobin: 14.5 g/dL (ref 13.0–17.0)
Immature Granulocytes: 1 %
Lymphocytes Relative: 16 %
Lymphs Abs: 1 10*3/uL (ref 0.7–4.0)
MCH: 29.6 pg (ref 26.0–34.0)
MCHC: 32.7 g/dL (ref 30.0–36.0)
MCV: 90.4 fL (ref 80.0–100.0)
Monocytes Absolute: 0.7 10*3/uL (ref 0.1–1.0)
Monocytes Relative: 13 %
Neutro Abs: 4 10*3/uL (ref 1.7–7.7)
Neutrophils Relative %: 67 %
Platelets: 129 10*3/uL — ABNORMAL LOW (ref 150–400)
RBC: 4.9 MIL/uL (ref 4.22–5.81)
RDW: 13.2 % (ref 11.5–15.5)
WBC: 5.9 10*3/uL (ref 4.0–10.5)
nRBC: 0 % (ref 0.0–0.2)

## 2022-11-12 MED ORDER — LIDOCAINE HCL 1 % IJ SOLN
INTRAMUSCULAR | Status: AC
  Administered 2022-11-12: 10 mL
  Filled 2022-11-12: qty 20

## 2022-11-12 MED ORDER — FENTANYL CITRATE (PF) 100 MCG/2ML IJ SOLN
INTRAMUSCULAR | Status: AC | PRN
  Administered 2022-11-12 (×2): 50 ug via INTRAVENOUS

## 2022-11-12 MED ORDER — MIDAZOLAM HCL 2 MG/2ML IJ SOLN
INTRAMUSCULAR | Status: AC
  Filled 2022-11-12: qty 2

## 2022-11-12 MED ORDER — SODIUM CHLORIDE 0.9 % IV SOLN: INTRAVENOUS | Status: DC

## 2022-11-12 MED ORDER — LIDOCAINE HCL (PF) 1 % IJ SOLN
INTRAMUSCULAR | Status: AC | PRN
  Administered 2022-11-12: 10 mL

## 2022-11-12 MED ORDER — MIDAZOLAM HCL 2 MG/2ML IJ SOLN
INTRAMUSCULAR | Status: AC | PRN
  Administered 2022-11-12 (×2): 1 mg via INTRAVENOUS

## 2022-11-12 MED ORDER — FENTANYL CITRATE (PF) 100 MCG/2ML IJ SOLN
INTRAMUSCULAR | Status: AC
  Filled 2022-11-12: qty 2

## 2022-11-12 NOTE — Procedures (Signed)
Interventional Radiology Procedure Note  Procedure: US Guided Biopsy of right submandibular lymph node  Complications: None  Estimated Blood Loss: < 10 mL  Findings: 79 G core biopsy of right submandibular LN performed under US guidance.  7 core samples obtained and sent to Pathology.  Venetia Night. Kathlene Cote, M.D Pager:  [phone]

## 2022-11-12 NOTE — Sedation Documentation (Signed)
Sample #6 obtained 

## 2022-11-12 NOTE — Sedation Documentation (Signed)
Sample #8 obtained

## 2022-11-12 NOTE — Sedation Documentation (Signed)
Sample #2 obtained

## 2022-11-12 NOTE — Sedation Documentation (Signed)
Sample #5 obtained

## 2022-11-12 NOTE — Discharge Instructions (Signed)
Needle Biopsy  PROCEDURE:  1 hour  RECOVERY:  1 hour      Please call Interventional Radiology clinic [phone] with any questions or concerns.  You may remove your dressing and shower tomorrow.  Needle Biopsy, Care After These instructions tell you how to care for yourself after your procedure. Your doctor may also give you more specific instructions. Call your doctor if you have any problems or questions. What can I expect after the procedure? After the procedure, it is common to have: Soreness. Bruising. Mild pain. Follow these instructions at home:  Return to your normal activities as told by your doctor. Ask your doctor what activities are safe for you. Take over-the-counter and prescription medicines only as told by your doctor. Wash your hands with soap and water before you change your bandage (dressing). If you cannot use soap and water, use hand sanitizer. Follow instructions from your doctor about: How to take care of your puncture site. When and how to change your bandage. When to remove your bandage. Check your puncture site every day for signs of infection. Watch for: Redness, swelling, or pain. Fluid or blood.  Pus or a bad smell. Warmth. Do not take baths, swim, or use a hot tub until your doctor approves. Ask your doctor if you may take showers. You may only be allowed to take sponge baths. Keep all follow-up visits as told by your doctor. This is important. Contact a doctor if you have: A fever. Redness, swelling, or pain at the puncture site, and it lasts longer than a few days. Fluid, blood, or pus coming from the puncture site. Warmth coming from the puncture site. Get help right away if: You have a lot of bleeding from the puncture site. Summary After the procedure, it is common to have soreness, bruising, or mild pain at the puncture site. Check your puncture site every day for signs of infection, such as redness, swelling, or pain. Get help right  away if you have severe bleeding from your puncture site. This information is not intended to replace advice given to you by your health care provider. Make sure you discuss any questions you have with your health care provider. Document Revised: 10/14/2017 Document Reviewed: 10/14/2017 Elsevier Patient Education  [place].  Moderate Conscious Sedation, Adult, Care After This sheet gives you information about how to care for yourself after your procedure. Your health care provider may also give you more specific instructions. If you have problems or questions, contact your health careprovider. What can I expect after the procedure? After the procedure, it is common to have: Sleepiness for several hours. Impaired judgment for several hours. Difficulty with balance. Vomiting if you eat too soon. Follow these instructions at home: For the time period you were told by your health care provider: Rest. Do not participate in activities where you could fall or become injured. Do not drive or use machinery. Do not drink alcohol. Do not take sleeping pills or medicines that cause drowsiness. Do not make important decisions or sign legal documents. Do not take care of children on your own. Eating and drinking  Follow the diet recommended by your health care provider. Drink enough fluid to keep your urine pale yellow. If you vomit: Drink water, juice, or soup when you can drink without vomiting. Make sure you have little or no nausea before eating solid foods.  General instructions Take over-the-counter and prescription medicines only as told by your health care provider. Have a responsible  adult stay with you for the time you are told. It is important to have someone help care for you until you are awake and alert. Do not smoke. Keep all follow-up visits as told by your health care provider. This is important. Contact a health care provider if: You are still sleepy or having  trouble with balance after 24 hours. You feel light-headed. You keep feeling nauseous or you keep vomiting. You develop a rash. You have a fever. You have redness or swelling around the IV site. Get help right away if: You have trouble breathing. You have new-onset confusion at home. Summary After the procedure, it is common to feel sleepy, have impaired judgment, or feel nauseous if you eat too soon. Rest after you get home. Know the things you should not do after the procedure. Follow the diet recommended by your health care provider and drink enough fluid to keep your urine pale yellow. Get help right away if you have trouble breathing or new-onset confusion at home. This information is not intended to replace advice given to you by your health care provider. Make sure you discuss any questions you have with your healthcare provider. Document Revised: 01/29/2020 Document Reviewed: 08/27/2019 Elsevier Patient Education  2022 [employer].

## 2022-11-12 NOTE — Consult Note (Signed)
Chief Complaint: Patient was seen in consultation today for image guided right submandibular mass biopsy  Referring Physician(s): Morrow,Aaron/Dorsey,J  Supervising Physician: Aletta Edouard  Patient Status: [facility] - Out-pt  History of Present Illness: Samuel Porter is an 82 y.o. male with past medical history of osteoarthritis, BPH, melanoma, glaucoma, hypertension, vitamin D deficiency who presents now with progressive swelling in his right jaw region since December of last year.  CT of the neck performed on 10/26/22 revealed:  1. Right submandibular space mass, 3.2 cm, spiculated, and heterogeneous. 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 But no obvious primary tongue or pharyngeal primary tumor identified (questionable heterogeneity at the right base of tongue), and alternatively an exophytic tumor of the right submandibular gland is difficult to exclude. Ultrasound Guided Core Needle Biopsy of the right submandibular lesion for histologic correlation may be the most valuable next step.   2. No associated right mandible bone lesion. But nonspecific sclerotic bone lesion of the right sphenoid wing, skull base. Differential considerations are sclerotic bone metastasis versus benign etiology such as fibrous dysplasia (favored).   3. No other metastatic disease identified in the neck or upper chest.  He presents today for image guided biopsy of the right submandibular mass.   Past Medical History:  Diagnosis Date   Arthritis    oa   BPH (benign prostatic hyperplasia)    Cancer ([place]) 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   Cataracts, bilateral    Glaucoma    Hypertension    Vitamin D deficiency     Past Surgical History:  Procedure Laterality Date   CHOLECYSTECTOMY     colonscopy  2017   JOINT REPLACEMENT Right     hip    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   Procedure: LEFT TOTAL HIP ARTHROPLASTY ANTERIOR APPROACH;  Surgeon: Gaynelle Arabian, MD;  Location: [facility] ORS;  Service: Orthopedics;  Laterality: Left;    Allergies: Patient has no known allergies.  Medications: Prior to Admission medications   Medication Sig Start Date End Date Taking? Authorizing Provider  amLODipine (NORVASC) 5 MG tablet Take 5 mg by mouth daily.    [provider]  aspirin 325 MG EC tablet Take 325 mg by mouth every 4 (four) hours as needed for pain.     [provider]  cholecalciferol (VITAMIN D) 1000 units tablet Take 1,000 Units by mouth as directed. Twice weekly    [provider]  COVID-19 mRNA Vac-TriS, Pfizer, (PFIZER-BIONT COVID-19 VAC-TRIS) SUSP injection Inject into the muscle. 05/05/21   Carlyle Basques, MD  dorzolamide-timolol (COSOPT) 22.3-6.8 MG/ML ophthalmic solution Place 1 drop into both eyes 2 (two) times daily. 07/09/16   [provider]  finasteride (PROSCAR) 5 MG tablet Take 5 mg by mouth daily.    [provider]  guaiFENesin (MUCINEX) 600 MG 12 hr tablet Take 600 mg by mouth 2 (two) times daily as needed for to loosen phlegm.    [provider]  guaifenesin (ROBITUSSIN) 100 MG/5ML syrup Take 200 mg by mouth 3 (three) times daily as needed for cough.    [provider]  latanoprost (XALATAN) 0.005 % ophthalmic solution Place 1 drop into both eyes at bedtime.    [provider]     Family History  Problem Relation Age of Onset   Cancer - Ovarian Sister  Lymphoma Sister     Social History   Socioeconomic History   Marital status: Married    Spouse name: Not on file   Number of children: Not on file   Years of education: Not on file   Highest education level: Not on file  Occupational History   Not on file  Tobacco Use   Smoking status: Former    Packs/day: 1.50    Years: 10.00    Total pack years: 15.00    Types:  Cigarettes    Quit date: 22    Years since quitting: 54.1   Smokeless tobacco: Never  Substance and Sexual Activity   Alcohol use: Yes    Comment: 1-2 glass wine per day   Drug use: No   Sexual activity: Not on file  Other Topics Concern   Not on file  Social History Narrative   Not on file   Social Determinants of Health   Financial Resource Strain: Not on file  Food Insecurity: Not on file  Transportation Needs: Not on file  Physical Activity: Not on file  Stress: Not on file  Social Connections: Not on file      Review of Systems denies fever, headache, chest pain, dyspnea, cough, abdominal/back pain, nausea, vomiting or bleeding.  Vital Signs: Blood pressure 141/86, temperature 97.8, heart rate 64, respirations 15, O2 sat 97% room air       Physical Exam awake, alert.  Chest clear to auscultation bilaterally.  Heart with normal rate, occasional ectopy.  Abdomen soft, positive bowel sounds, nontender. Firm, nontender palpable mass of the right submandibular region  Imaging: CT SOFT TISSUE NECK W CONTRAST  Result Date: 10/26/2022 CLINICAL DATA:  82 year old male with right jaw region swelling which started in December. Progression in the past 2 weeks. EXAM: CT NECK WITH CONTRAST TECHNIQUE: Multidetector CT imaging of the neck was performed using the standard protocol following the bolus administration of intravenous contrast. RADIATION DOSE REDUCTION: This exam was performed according to the departmental dose-optimization program which includes automated exposure control, adjustment of the mA and/or kV according to patient size and/or use of iterative reconstruction technique. CONTRAST:  18m OMNIPAQUE IOHEXOL 300 MG/ML  SOLN COMPARISON:  None Available. FINDINGS: Pharynx and larynx: Larynx appears symmetric and normal, including the epiglottis. Pharyngeal soft tissue contours remain within normal limits. There is questionable subtle increased enhancement at the right base  of tongue on series 2, image 60, but this is not convincing on the sagittal or coronal images. Parapharyngeal and retropharyngeal spaces are negative. Salivary glands: Dental streak artifact. No oral tongue or oral cavity mass is identified, and the sublingual space appears to remain negative. However, there is a large, heterogeneous, and irregular malignant appearing right level 1 B soft tissue mass beneath the body of the right (up to 3 cm long axis) mandible tracking toward the skin surface and corresponding to the marked area of palpable concern series 2, image 73. This might be a malignant lymph node with extracapsular extension, but is inseparable from the more medially positioned right submandibular gland on series 2, image 73. Contralateral left submandibular gland appears normal except there is an abnormal enlarged and heterogeneous left level 1 B lymph node also which is 11 mm. See additional details below. Parotid glands are negative. Thyroid: Diminutive, negative. Lymph nodes: Large spiculated and heterogeneous 3.2 cm long axis soft tissue mass in the right submandibular space bulging or tracking through the platysma and toward the skin surface. This is the palpable  abnormality, and as described above might be a malignant lymph node with extracapsular extension. Contralateral similar abnormally enlarged and heterogeneous left level 1 B lymph node (series 2, image 67). However, no level 1 a lymphadenopathy. No level 2 or level 3 cervical lymphadenopathy. Level 4 and 5 nodal stations also appear within normal limits. Vascular: Major vascular structures in the neck and at the skull base appear to be patent, suboptimal intravascular contrast bolus. Mild for age cervical carotid atherosclerosis. Tortuous right ICA below the skull base. Limited intracranial: Negative for age visible brain parenchyma. Calcified atherosclerosis at the skull base. Visualized orbits: Postoperative changes to both globes, otherwise  negative. Mastoids and visualized paranasal sinuses: Minimal paranasal sinus mucosal thickening. Tympanic cavities and mastoids are well aerated. Skeleton: Right mandible remains intact and does not appear invaded by the submandibular soft tissue mass. No mandible bone lesion identified. Extensive cervical spine degeneration including developing degenerative appearing interbody ankylosis C5-C6 and C6-C7. There is asymmetric sclerosis of the right lateral sphenoid wing, temporal bone articular fossa and posterior zygoma. See coronal image 60. this could be sclerotic metastatic disease, but benign fibrous dysplasia might also have this appearance. No other suspicious sclerotic bone lesion identified in the neck. Upper chest: Apical lung scarring greater on the right. Negative visible thoracic inlet. IMPRESSION: 1. Right submandibular space mass, 3.2 cm, spiculated, and heterogeneous. 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 But no obvious primary tongue or pharyngeal primary tumor identified (questionable heterogeneity at the right base of tongue), and alternatively an exophytic tumor of the right submandibular gland is difficult to exclude. Ultrasound Guided Core Needle Biopsy of the right submandibular lesion for histologic correlation may be the most valuable next step. 2. No associated right mandible bone lesion. But nonspecific sclerotic bone lesion of the right sphenoid wing, skull base. Differential considerations are sclerotic bone metastasis versus benign etiology such as fibrous dysplasia (favored). 3. No other metastatic disease identified in the neck or upper chest. Electronically Signed   By: Genevie Ann M.D.   On: 10/26/2022 11:01    Labs:  CBC: Recent Labs    11/01/22 0954  WBC 6.1  HGB 15.0  HCT 43.5  PLT 123*    COAGS: No results for input(s): "INR",  "APTT" in the last 8760 hours.  BMP: Recent Labs    11/01/22 0954  NA 141  K 4.2  CL 110  CO2 27  GLUCOSE 90  BUN 18  CALCIUM 9.7  CREATININE 0.91  GFRNONAA >60    LIVER FUNCTION TESTS: Recent Labs    11/01/22 0954  BILITOT 1.1  AST 17  ALT 16  ALKPHOS 57  PROT 6.9  ALBUMIN 3.7    TUMOR MARKERS: No results for input(s): "AFPTM", "CEA", "CA199", "CHROMGRNA" in the last 8760 hours.  Assessment and Plan: 82 y.o. male with past medical history of osteoarthritis, BPH, melanoma, glaucoma, hypertension, vitamin D deficiency who presents now with progressive swelling in his right jaw region since December of last year.  CT of the neck performed on 10/26/22 revealed:  1. Right submandibular space mass, 3.2 cm, spiculated, and heterogeneous. 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 But no obvious primary tongue or pharyngeal primary tumor identified (questionable heterogeneity at the right base  of tongue), and alternatively an exophytic tumor of the right submandibular gland is difficult to exclude. Ultrasound Guided Core Needle Biopsy of the right submandibular lesion for histologic correlation may be the most valuable next step.   2. No associated right mandible bone lesion. But nonspecific sclerotic bone lesion of the right sphenoid wing, skull base. Differential considerations are sclerotic bone metastasis versus benign etiology such as fibrous dysplasia (favored).   3. No other metastatic disease identified in the neck or upper chest.  He presents today for image guided biopsy of the right submandibular mass.Risks and benefits of procedure was discussed with the patient  including, but not limited to bleeding, infection, damage to adjacent structures or low yield requiring additional tests.  All of the questions were answered  and there is agreement to proceed.  Consent signed and in chart.    Thank you for this interesting consult.  I greatly enjoyed meeting RANDEEP BIONDOLILLO and look forward to participating in their care.  A copy of this report was sent to the requesting provider on this date.  Electronically Signed: D. Rowe Robert, PA-C 11/12/2022, 11:33 AM   I spent a total of  20 minutes   in face to face in clinical consultation, greater than 50% of which was counseling/coordinating care for image guided biopsy of right submandibular mass

## 2022-11-12 NOTE — Sedation Documentation (Signed)
Sample #7 obtained

## 2022-11-12 NOTE — Sedation Documentation (Signed)
Sample #3 obtained

## 2022-11-12 NOTE — Sedation Documentation (Signed)
Sample #1 obtained

## 2022-11-12 NOTE — Sedation Documentation (Signed)
Sample #4 obtained

## 2022-11-15 ENCOUNTER — Telehealth: Payer: Self-pay | Admitting: Physician Assistant

## 2022-11-15 DIAGNOSIS — C760 Malignant neoplasm of head, face and neck: Secondary | ICD-10-CM

## 2022-11-15 NOTE — Progress Notes (Signed)
Referral faxed to [facility]

## 2022-11-15 NOTE — Telephone Encounter (Signed)
I called Samuel Porter to review the right submandibular lymph node pathology results. 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 Next steps including staging PET/CT scan, referral to radiation oncology and referral to ENT. Patient will follow up with Dr. Chryl Heck in medical oncology to finalize treatments. Patient expressed understanding of the pla provided.

## 2022-11-16 ENCOUNTER — Encounter: Payer: Self-pay | Admitting: *Deleted

## 2022-11-16 NOTE — Progress Notes (Signed)
Erroneous encounter

## 2022-11-16 NOTE — Telephone Encounter (Signed)
Oncology Nurse Navigator Documentation      No data to display         Encounter in error.

## 2022-11-19 ENCOUNTER — Encounter: Payer: Self-pay | Admitting: Physician Assistant

## 2022-11-19 NOTE — Progress Notes (Signed)
Oncology Nurse Navigator Documentation   I called Mr. Puryear today to introduce myself as his navigator. I left a VM providing him with my direct contact information and asked him to return my call and I'd be happy to talk with him and answer any questions that he may have.  Harlow Asa RN, BSN, OCN Head & Neck Oncology Nurse [place] at [facility] Phone # [phone]  Fax # [phone]

## 2022-11-20 ENCOUNTER — Telehealth: Payer: Self-pay | Admitting: Hematology and Oncology

## 2022-11-20 ENCOUNTER — Telehealth: Payer: Self-pay | Admitting: Radiation Oncology

## 2022-11-20 NOTE — Progress Notes (Signed)
Head and Neck Cancer Location of Tumor / Histology:  Mass of right submandibular region   Patient presented with progressive swelling in his right jaw that started December 2023.    Neck CT 10/25/2022 IMPRESSION: 1. Right submandibular space mass, 3.2 cm, spiculated, and heterogeneous.  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  But no obvious primary tongue or pharyngeal primary tumor identified (questionable heterogeneity at the right base of tongue), and alternatively an exophytic tumor of the right submandibular gland is difficult to exclude.  No associated right mandible bone lesion. But nonspecific sclerotic bone lesion of the right sphenoid wing, skull base.  No other metastatic disease identified in the neck or upper chest.        Biopsies revealed:  FINAL MICROSCOPIC DIAGNOSIS:  A.   LYMPH NODE, RIGHT SUBMANDIBULAR, BIOPSY: -    Metastatic poorly differentiated squamous cell carcinoma (see Comment)    Nutrition Status Yes No Comments  Weight changes? []$  [x]$    Swallowing concerns? []$  [x]$    PEG? []$  []$     Referrals Yes No Comments  Social Work? []$  []$    Dentistry? []$  []$    Swallowing therapy? []$  []$    Nutrition? []$  []$    Med/Onc? []$  []$     Safety Issues Yes No Comments  Prior radiation? []$  [x]$    Pacemaker/ICD? []$  [x]$    Possible current pregnancy? []$  [x]$  Male Patient.  Is the patient on methotrexate? []$  [x]$     Tobacco/Marijuana/Snuff/ETOH use: former tobacco use  Past/Anticipated interventions by otolaryngology, if any:  Dr. [place] Callas 11/29/2022   11-12-22 Pre-procedure Diagnoses  Mass of right submandibular region [R22.0]   Post-procedure Diagnoses  Mass of right submandibular region [R22.0]   Procedures  [place] CORE BIOPSY (LYMPH NODES) [id number]       Signed      Interventional Radiology Procedure Note   Procedure: US Guided  Biopsy of right submandibular lymph node   Complications: None   Estimated Blood Loss: < 10 mL   Findings: 83 G core biopsy of right submandibular LN performed under US guidance.  7 core samples obtained and sent to Pathology.   Venetia Night. Kathlene Cote, M.D Pager:  [phone]          Past/Anticipated interventions by medical oncology, if any: Pt to see Dr. Chryl Heck on 11-30-22  Current Complaints / other details:

## 2022-11-20 NOTE — Telephone Encounter (Signed)
Called pt to sch appt per 2/5 referral msg from [employer]. No answer. Left msg to have pt call me back to sch appt.

## 2022-11-20 NOTE — Progress Notes (Signed)
Oncology Nurse Navigator Documentation   Placed introductory call to new referral patient Samuel Porter. Introduced myself as the H&N oncology nurse navigator that works with Dr. Isidore Moos and Dr. Chryl Heck to whom he has been referred by Dede Query PA.  He confirmed understanding of referral. Briefly explained my role as his navigator, provided my contact information.  Confirmed understanding of upcoming appts to be scheduled and [place] location, explained arrival and registration process. I encouraged him to call with questions/concerns as he moves forward with appts and procedures.   He verbalized understanding of information provided, expressed appreciation for my call.   Navigator Initial Assessment Employment Status: he is retired Currently on [employer] / STD: na Living Situation: he lives with his wife Support System: wife, family PCP: London Pepper MD PCD: Financial Concerns: no Transportation Needs: no Sensory Deficits: no Engineer, building services Needed:  no Ambulation Needs: no Psychosocial Needs:  no Concerns/Needs Understanding Cancer:  addressed/answered by navigator to best of ability Self-Expressed Needs: no   Clinical biochemist, BSN, OCN Head & Neck Oncology Nurse [place] at [facility] Phone # [phone]  Fax # [phone]

## 2022-11-20 NOTE — Telephone Encounter (Signed)
2/6 @ 8:53 am Left voicemail for patient to call our office to be scheduled for consult.

## 2022-11-20 NOTE — Telephone Encounter (Signed)
Scheduled appt per 2/6 referral from from nurse navigator. Pt is aware of appt date and time. Pt is aware to arrive 15 mins prior to appt time and to bring and updated insurance card. Pt is aware of appt location.

## 2022-11-21 DIAGNOSIS — H26492 Other secondary cataract, left eye: Secondary | ICD-10-CM | POA: Diagnosis not present

## 2022-11-22 DIAGNOSIS — C779 Secondary and unspecified malignant neoplasm of lymph node, unspecified: Secondary | ICD-10-CM | POA: Diagnosis not present

## 2022-11-22 LAB — SURGICAL PATHOLOGY

## 2022-11-27 ENCOUNTER — Encounter (HOSPITAL_COMMUNITY)
Admission: RE | Admit: 2022-11-27 | Discharge: 2022-11-27 | Disposition: A | Discharge status: Home / Self Care | Payer: Medicare HMO | Source: Ambulatory Visit | Attending: Physician Assistant | Admitting: Physician Assistant

## 2022-11-27 DIAGNOSIS — C760 Malignant neoplasm of head, face and neck: Secondary | ICD-10-CM | POA: Insufficient documentation

## 2022-11-27 DIAGNOSIS — I700 Atherosclerosis of aorta: Secondary | ICD-10-CM | POA: Diagnosis not present

## 2022-11-27 DIAGNOSIS — R918 Other nonspecific abnormal finding of lung field: Secondary | ICD-10-CM | POA: Diagnosis not present

## 2022-11-27 DIAGNOSIS — K8689 Other specified diseases of pancreas: Secondary | ICD-10-CM | POA: Diagnosis not present

## 2022-11-27 LAB — GLUCOSE, CAPILLARY: Glucose-Capillary: 98 mg/dL (ref 70–99)

## 2022-11-27 MED ORDER — FLUDEOXYGLUCOSE F - 18 (FDG) INJECTION
9.9000 | Freq: Once | INTRAVENOUS | Status: AC
  Administered 2022-11-27: 11.3 via INTRAVENOUS

## 2022-11-29 DIAGNOSIS — C969 Malignant neoplasm of lymphoid, hematopoietic and related tissue, unspecified: Secondary | ICD-10-CM | POA: Diagnosis not present

## 2022-11-29 DIAGNOSIS — R933 Abnormal findings on diagnostic imaging of other parts of digestive tract: Secondary | ICD-10-CM | POA: Diagnosis not present

## 2022-11-29 DIAGNOSIS — C779 Secondary and unspecified malignant neoplasm of lymph node, unspecified: Secondary | ICD-10-CM | POA: Diagnosis not present

## 2022-11-29 NOTE — Progress Notes (Signed)
Radiation Oncology         [phone] ________________________________  Initial Outpatient Consultation  Name: Samuel Porter MRN: [id number]  Date: 11/30/2022  DOB: 1940/10/19  [id number], Marjory Lies, MD  Orson Slick, MD   REFERRING PHYSICIAN: Orson Slick, MD  DIAGNOSIS: C77.0   ICD-10-CM   1. Mass of right submandibular region  R22.0     2. 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 Patient presented with progressive swelling in his right jaw that started December 2023.    CHIEF COMPLAINT: Here to discuss management of neck cancer  HISTORY OF PRESENT ILLNESS::Samuel Porter is a 82 y.o. male who presented to his PCP in late December 2023 with right jaw/submandibular swelling. This prompted a soft tissue neck CT with contrast on 10/25/22 which showed a right submandibular space mass measuring approximately 3.2 cm, favored to represent a malignant right level 1B lymph node with extracapsular nodal extension. CT also showed a smaller but malignant appearing contralateral left level 1B lymph node, measuring approximately 1.1 cm.   Subsequently, the patient was referred to the [facility] rapid diagnostic clinic on 11/01/22 for further evaluation and management. During this visit, the patient reported an increase in swelling of the right submandibular mass over the course of 2 weeks. He denied any associated pain, or any other associated symptoms or concerns. Labs collected at the time of this visit showed thrombocytopenia (platelet count of 123) but were otherwise unremarkable.    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    Appropriately controlled p16 immunohistochemical stain shows focal patchy staining.  The strong diffuse expression identified in HPV associated carcinomas indicating a clonal process is not identified in  this tumor.  Appropriately controlled EBV ISH is negative   Pertinent imaging thus far includes a PET scan performed on 11/27/22 which revealed: increased metabolic activity associated with the right submandibular mass and left submandibular lymph node; diffuse oral cavity uptake tracking to the base of the tongue and; subtle increased soft tissue along the buccal aspect of the left mandible containing mildly asymmetric activity; and subcarinal nodal tissue with potential reactive activity. (PET also showed some small pulmonary nodules with increased metabolic activity, and some also with a slight increase in size, however this is when compared to imaging performed in 2005).   The patient met with Dr. Devol Callas at [facility] yesterday. He also met with [facility] medical oncology. Encounter details are pending at this time.  However the patient and his daughter are good historians.  They mention that Dr.  Callas was able to get them in front of a medical oncologist during his visit and the consensus was to proceed with induction immunotherapy followed by surgical resection and possible postoperative radiation therapy.  Of note: the patient has a family history of ovarian cancer in his sister, and lymphoma per his sister  Swallowing issues, if any: denies any swallowing issues   Weight Changes: denies any weight loss Wt Readings  from Last 3 Encounters:  11/30/22 197 lb 1.6 oz (89.4 kg)  11/30/22 196 lb 4 oz (89 kg)  11/01/22 199 lb 4.8 oz (90.4 kg)   Pain status: denies  any pain  Other symptoms: presented with right submandibular swelling  Tobacco history, if any: former smoker with a 15 pack year smoking history, quit smoking approximately 54 years ago   ETOH abuse, if any: drinks on occasion   Prior cancers, if any: He reports h/o skin cancer on his nose, status post resection, yielding squamous cell carcinoma; history of skin cancer including SCC of the left lower limb/hip, and 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    PAST SURGICAL HISTORY: Past Surgical History:  Procedure Laterality Date   CHOLECYSTECTOMY     colonscopy  2017   JOINT REPLACEMENT Right    hip    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   Procedure: LEFT TOTAL HIP ARTHROPLASTY ANTERIOR APPROACH;  Surgeon: Gaynelle Arabian, MD;  Location: [facility] ORS;  Service: Orthopedics;  Laterality: Left;    FAMILY HISTORY: family history includes Cancer - Ovarian in his sister; Lymphoma in his sister.  SOCIAL HISTORY:  reports that he quit smoking about 54 years ago. His smoking use included cigarettes. He has a 15.00 pack-year smoking history. He has never used smokeless tobacco. He reports current alcohol use. He reports that he does not use drugs.  ALLERGIES: Patient has no known allergies.  MEDICATIONS:  Current Outpatient Medications  Medication Sig Dispense Refill   amLODipine (NORVASC) 5 MG tablet Take 5 mg by mouth daily.     aspirin EC 81 MG tablet Take 81 mg by mouth daily. Swallow whole.     cholecalciferol (VITAMIN D) 1000 units tablet Take 1,000 Units by mouth as directed. Twice weekly     dorzolamide-timolol (COSOPT) 22.3-6.8 MG/ML ophthalmic solution Place 1 drop into both eyes 2 (two) times daily.     finasteride (PROSCAR) 5 MG tablet Take 5 mg by  mouth daily.     guaiFENesin (MUCINEX) 600 MG 12 hr tablet Take 600 mg by mouth 2 (two) times daily as needed for to loosen phlegm.     guaifenesin (ROBITUSSIN) 100 MG/5ML syrup Take 200 mg by mouth 3 (three) times daily as needed for cough.     latanoprost (XALATAN) 0.005 % ophthalmic solution Place 1 drop into both eyes at bedtime.     COVID-19 mRNA Vac-TriS, Pfizer, (PFIZER-BIONT COVID-19 VAC-TRIS) SUSP injection Inject into the muscle. 0.3 mL 0   No current facility-administered medications for this encounter.    REVIEW OF SYSTEMS:  Notable for that above.   PHYSICAL EXAM:  height is 6' 1.5" (1.867 m) and weight is 196 lb 4 oz (89 kg). His temporal temperature is 96.9 F (36.1 C) (abnormal). His blood pressure is 171/70 (abnormal) and his pulse is 50 (abnormal). His respiration is 18 and oxygen saturation is 99%.   General: Alert and oriented, in no acute distress HEENT: Head is normocephalic. Extraocular movements are intact. Oropharynx is notable for no lesions. Neck: Neck is notable for visibly enlarged, palpable right submandibular mass that is eroding through his skin.   No palpable adenopathy throughout the remainder of the neck Heart: Heart is bradycardic and irregular in rate with no murmurs Chest: Clear to  auscultation bilaterally, with no rhonchi, wheezes, or rales. Abdomen: Soft, nontender, nondistended, with no rigidity or guarding. Extremities: No cyanosis or edema. Lymphatics: see Neck Exam Skin: He has a nodule over his right cheek that looks like a possible basal cell carcinoma (it is pearly with telangiectasias -he will see his dermatologist soon for assessment) ; evidence of chronic sun damage over his skin and postoperative changes on his nose Musculoskeletal: symmetric strength and muscle tone throughout. Neurologic: Cranial nerves II through XII are grossly intact. No obvious focalities. Speech is fluent. Coordination is intact. Psychiatric: Judgment and insight are  intact. Affect is appropriate.   ECOG = 1  0 - Asymptomatic (Fully active, able to carry on all predisease activities without restriction)  1 - Symptomatic but completely ambulatory (Restricted in physically strenuous activity but ambulatory and able to carry out work of a light or sedentary nature. For example, light housework, office work)  2 - Symptomatic, <50% in bed during the day (Ambulatory and capable of all self care but unable to carry out any work activities. Up and about more than 50% of waking hours)  3 - Symptomatic, >50% in bed, but not bedbound (Capable of only limited self-care, confined to bed or chair 50% or more of waking hours)  4 - Bedbound (Completely disabled. Cannot carry on any self-care. Totally confined to bed or chair)  5 - Death   Eustace Pen MM, Creech RH, Tormey DC, et al. [phone]). "Toxicity and response criteria of the [facility]". [place] Oncol. 5 (6): 649-55   LABORATORY DATA:  Lab Results  Component Value Date   WBC 5.9 11/12/2022   HGB 14.5 11/12/2022   HCT 44.3 11/12/2022   MCV 90.4 11/12/2022   PLT 129 (L) 11/12/2022   CMP     Component Value Date/Time   NA 141 11/01/2022 0954   K 4.2 11/01/2022 0954   CL 110 11/01/2022 0954   CO2 27 11/01/2022 0954   GLUCOSE 90 11/01/2022 0954   BUN 18 11/01/2022 0954   CREATININE 0.91 11/01/2022 0954   CALCIUM 9.7 11/01/2022 0954   PROT 6.9 11/01/2022 0954   ALBUMIN 3.7 11/01/2022 0954   AST 17 11/01/2022 0954   ALT 16 11/01/2022 0954   ALKPHOS 57 11/01/2022 0954   BILITOT 1.1 11/01/2022 0954   GFRNONAA >60 11/01/2022 0954   GFRAA >60 06/17/2018 0457      No results found for: "TSH"   RADIOGRAPHY: NM PET Image Initial (PI) Skull Base To Thigh  Result Date: 11/28/2022 CLINICAL DATA:  Initial treatment strategy for head neck cancer in a male at age 88. Patient found to have poorly differentiated squamous cell carcinoma. EXAM: NUCLEAR MEDICINE PET SKULL BASE TO THIGH  TECHNIQUE: 11.3 mCi F-18 FDG was injected intravenously. Full-ring PET imaging was performed from the skull base to thigh after the radiotracer. CT data was obtained and used for attenuation correction and anatomic localization. Fasting blood glucose: 98 mg/dl COMPARISON:  CT of the neck October 25, 2021. FINDINGS: Mediastinal blood pool activity: SUV max 2.79 Liver activity: SUV max NA NECK: Bulky RIGHT submandibular mass measures 3.2 x 3.1 cm and shows a maximum SUV of 15.5 (image 39/4) Ovoid LEFT submandibular lymph node 14 mm with a maximum SUV of 5.82 (image 38/4) Diffuse Peri lingual or ule cavity uptake contiguous with activity along the base of the tongue and LEFT anterior oral greater than RIGHT uptake is of uncertain significance and could represent pooling of secretions. Dental  hardware limiting assessment in the area of the oral cavity. Artifact from multiple dental fillings. Mild fullness of soft tissue adjacent to the LEFT mandible (image 32/4) this corresponds to increased metabolic activity seen on the same side within the perimandibular soft tissues. Incidental CT findings: None. CHEST: Mild enlargement of subcarinal nodal tissue, mild fullness of RIGHT juxta hilar and RIGHT paratracheal nodal tissue. (Image 81/4) subcarinal lymph node measuring 15 mm short axis with a maximum SUV of 4.0. Scattered small lymph nodes throughout the chest, next largest along the RIGHT paratracheal/perihilar region (image 76/4) 9 mm with slightly less metabolic activity. Pre-vascular lymph node measuring 9 mm with rounded appearance shows no substantial metabolic activity beyond mediastinal blood pool. Small focus of activity in the RIGHT upper lobe (image 68/4) this is amidst bronchovascular structures and without discrete lesion. There are patchy areas of ground-glass and septal thickening throughout the chest. Small nodule in the RIGHT lower lobe (image 96/4) 6 mm without substantial increased metabolic activity.  Small nodule in the LEFT lower lobe at 7 mm without substantial increased metabolic activity (image [id number]) present as far back as April of 2025 with enlargement from 4 mm on the prior study. Incidental CT findings: Calcified aortic atherosclerotic changes. Normal heart size. No pericardial effusion. Patchy areas of airspace and interstitial opacity in the chest. Airways are patent. ABDOMEN/PELVIS: No abnormal hypermetabolic activity within the liver, pancreas, adrenal glands, or spleen. No hypermetabolic lymph nodes in the abdomen or pelvis. Incidental CT findings: No acute findings relative to the liver. Post cholecystectomy. Pancreatic atrophy without signs of inflammation. No acute intra-abdominal or pelvic findings. Pelvic assessment limited by streak artifact from bilateral hip arthroplasty changes. SKELETON: No focal hypermetabolic activity to suggest skeletal metastasis. Incidental CT findings: Spinal degenerative changes. Bilateral hip arthroplasty. IMPRESSION: RIGHT submandibular mass and LEFT submandibular lymph node with increased metabolic activity compatible with known neoplasm. Diffuse oral cavity uptake tracking to the base of the tongue and with subtle increased soft tissue along the buccal aspect of the LEFT mandible and mildly asymmetric activity in this area. Findings are nonspecific but warrant dedicated direct visualization given history of squamous cell carcinoma to exclude oropharyngeal neoplasm. Equivocal appearance of subcarinal nodal tissue in particular, potentially reactive given the appearance of lung parenchyma which raises the question of ongoing viral or atypical process. Correlate clinically with short interval follow-up or biopsy as warranted. Small pulmonary nodules without increased metabolic activity some present and very slightly enlarged since 2005 favor benign or indolent process these areas. Electronically Signed   By: Zetta Bills M.D.   On: 11/28/2022 09:13   [place] CORE  BIOPSY (SOFT TISSUE)  Result Date: 11/12/2022 CLINICAL DATA:  Enlarged right submandibular lymph node. The patient presents for biopsy. EXAM: ULTRASOUND GUIDED CORE BIOPSY OF RIGHT SUBMANDIBULAR LYMPH NODE MEDICATIONS: Moderate (conscious) sedation was employed during this procedure. A total of Versed 2.0 mg and Fentanyl 100 mcg was administered intravenously BY RADIOLOGY NURSING. Moderate Sedation Time: 12 minutes. The patient's level of consciousness and vital signs were monitored continuously by radiology nursing throughout the procedure under my direct supervision. PROCEDURE: The procedure, risks, benefits, and alternatives were explained to the patient. Questions regarding the procedure were encouraged and answered. The patient understands and consents to the procedure. A time out was performed prior to initiating the procedure. Ultrasound was used to localize an enlarged right submandibular lymph node. The right submandibular region was prepped with chlorhexidine in a sterile fashion, and a sterile drape was applied covering the  operative field. A sterile gown and sterile gloves were used for the procedure. Local anesthesia was provided with 1% Lidocaine. Under ultrasound guidance, multiple 18 gauge core biopsy samples were obtained of a right submandibular lymph node. Initially multiple samples were submitted on saline soaked Telfa gauze. Three separate samples were than submitted in formalin. Additional ultrasound was performed. COMPLICATIONS: None. FINDINGS: Enlarged and pathologic appearing right submandibular lymph node appears necrotic and measures just over 3 cm in greatest estimated diameter by ultrasound. Necrotic as well as solid tissue was obtained with core biopsy in different portions of the lymph node. IMPRESSION: Ultrasound-guided core biopsy performed of an enlarged and necrotic right submandibular lymph node. Electronically Signed   By: Aletta Edouard M.D.   On: 11/12/2022 13:58       IMPRESSION/PLAN:  Cancer Staging  Secondary malignant neoplasm of cervical lymph node (HCC) Staging form: Cutaneous Carcinoma of the Head and Neck, AJCC 8th Edition - Clinical stage from 11/30/2022: Stage IV (cTX, cN3b, cM0) - Signed by Eppie Gibson, MD on 11/30/2022 Stage prefix: Initial diagnosis Extraosseous extension: Unknown  This is a very pleasant 82 year old-year-old gentleman with a history of squamous cell carcinoma of the nose who now has bilateral submandibular adenopathy, pathology demonstrates squamous cell carcinoma.  This is consistent with skin primary  I concur with the reported recommendations from [facility] for induction immunotherapy.  He will see our medical oncologist today.  I am not sure if he will receive his systemic treatment at [facility] or here.  He anticipates surgery at [facility] eventually; given the skin erosion and extracapsular extension there is a significant possibility that he would benefit from postoperative radiation therapy pending surgical results/restaging.  The patient and family know I am happy to coordinate care with any of the specialists at [facility] and would not hesitate to talk to them over the phone about strategies for his treatment following immunotherapy and surgery.  We discussed the potential risks, benefits, and side effects of radiotherapy which may be given to his bilateral neck. We talked in detail about acute and late effects. We discussed that some of the most bothersome acute effects may be mucositis, dysgeusia, salivary changes, skin irritation, hair loss, dehydration, weight loss and fatigue. We talked about late effects which include but are not necessarily limited to dysphagia, hypothyroidism, nerve injury, vascular injury, spinal cord injury, xerostomia, trismus, neck edema, and potential injury to any of the tissues in the head and neck region. No guarantees of treatment were given. A consent form was signed and placed in the patient's medical record. The  patient is enthusiastic about proceeding with treatment.  He understands that standard treatment is given 5 days a week for about 6 weeks for his type of disease but that sometimes hypofractionation is prudent for older patients and can be given in 4 weeks.  I will be available to see him as needed following his systemic therapy and surgery.  I wished him the very best.  Our head and neck oncology navigator was present for the entire consultation and will accompany him to medical oncology next.  On date of service, in total, I spent 60 minutes on this encounter. Patient was seen in person.  __________________________________________   Eppie Gibson, MD  This document serves as a record of services personally performed by Eppie Gibson, MD. It was created on her behalf by Roney Mans, a trained medical scribe. The creation of this record is based on the scribe's personal observations and the provider's statements to  them. This document has been checked and approved by the attending provider.  

## 2022-11-30 ENCOUNTER — Ambulatory Visit
Admission: RE | Admit: 2022-11-30 | Discharge: 2022-11-30 | Disposition: A | Discharge status: Home / Self Care | Payer: Medicare HMO | Source: Ambulatory Visit | Attending: Radiation Oncology | Admitting: Radiation Oncology

## 2022-11-30 ENCOUNTER — Other Ambulatory Visit: Payer: Self-pay

## 2022-11-30 ENCOUNTER — Inpatient Hospital Stay: Payer: Medicare HMO | Attending: Physician Assistant | Admitting: Hematology and Oncology

## 2022-11-30 ENCOUNTER — Inpatient Hospital Stay: Payer: Medicare HMO

## 2022-11-30 ENCOUNTER — Encounter: Payer: Self-pay | Admitting: Radiation Oncology

## 2022-11-30 VITALS — BP 157/67 | HR 62 | Temp 97.6°F | Resp 16 | Ht 73.0 in | Wt 197.1 lb

## 2022-11-30 VITALS — BP 171/70 | HR 50 | Temp 96.9°F | Resp 18 | Ht 73.5 in | Wt 196.2 lb

## 2022-11-30 DIAGNOSIS — Z87891 Personal history of nicotine dependence: Secondary | ICD-10-CM | POA: Diagnosis not present

## 2022-11-30 DIAGNOSIS — C770 Secondary and unspecified malignant neoplasm of lymph nodes of head, face and neck: Secondary | ICD-10-CM | POA: Insufficient documentation

## 2022-11-30 DIAGNOSIS — Z85828 Personal history of other malignant neoplasm of skin: Secondary | ICD-10-CM | POA: Diagnosis not present

## 2022-11-30 DIAGNOSIS — C4492 Squamous cell carcinoma of skin, unspecified: Secondary | ICD-10-CM | POA: Diagnosis not present

## 2022-11-30 DIAGNOSIS — R918 Other nonspecific abnormal finding of lung field: Secondary | ICD-10-CM | POA: Insufficient documentation

## 2022-11-30 DIAGNOSIS — Z7982 Long term (current) use of aspirin: Secondary | ICD-10-CM | POA: Insufficient documentation

## 2022-11-30 DIAGNOSIS — Z806 Family history of leukemia: Secondary | ICD-10-CM | POA: Insufficient documentation

## 2022-11-30 DIAGNOSIS — R221 Localized swelling, mass and lump, neck: Secondary | ICD-10-CM | POA: Diagnosis not present

## 2022-11-30 DIAGNOSIS — I10 Essential (primary) hypertension: Secondary | ICD-10-CM | POA: Diagnosis not present

## 2022-11-30 DIAGNOSIS — Z8041 Family history of malignant neoplasm of ovary: Secondary | ICD-10-CM | POA: Diagnosis not present

## 2022-11-30 DIAGNOSIS — Z79899 Other long term (current) drug therapy: Secondary | ICD-10-CM | POA: Diagnosis not present

## 2022-11-30 DIAGNOSIS — Z85820 Personal history of malignant melanoma of skin: Secondary | ICD-10-CM | POA: Diagnosis not present

## 2022-11-30 DIAGNOSIS — D696 Thrombocytopenia, unspecified: Secondary | ICD-10-CM | POA: Insufficient documentation

## 2022-11-30 DIAGNOSIS — R220 Localized swelling, mass and lump, head: Secondary | ICD-10-CM

## 2022-11-30 DIAGNOSIS — H409 Unspecified glaucoma: Secondary | ICD-10-CM | POA: Diagnosis not present

## 2022-11-30 DIAGNOSIS — N400 Enlarged prostate without lower urinary tract symptoms: Secondary | ICD-10-CM | POA: Insufficient documentation

## 2022-11-30 DIAGNOSIS — Z96643 Presence of artificial hip joint, bilateral: Secondary | ICD-10-CM | POA: Diagnosis not present

## 2022-11-30 DIAGNOSIS — E559 Vitamin D deficiency, unspecified: Secondary | ICD-10-CM | POA: Diagnosis not present

## 2022-11-30 DIAGNOSIS — C801 Malignant (primary) neoplasm, unspecified: Secondary | ICD-10-CM | POA: Diagnosis not present

## 2022-11-30 NOTE — Progress Notes (Signed)
Oncology Nurse Navigator Documentation   Met with patient during initial consult with Dr. Chryl Heck. He was accompanied by his daughter.  Further introduced myself as his/their Navigator, explained my role as a member of the Care Team. Samuel Porter plans to begin immunotherapy treatment here at [facility]. He will contact the navigator at [facility] today to let her know and plan to follow up there during his treatment for continued surveillance during his treatment here.   They verbalized understanding of information provided. I encouraged them to call with questions/concerns moving forward.  Harlow Asa, RN, BSN, OCN Head & Neck Oncology Nurse [facility] [phone]

## 2022-11-30 NOTE — Assessment & Plan Note (Signed)
  This is a very pleasant 82 year old male patient with newly diagnosed submandibular gland swelling, pathology showing poorly differentiated partially keratinizing squamous cell carcinoma which is thought to be metastasis from the skin now status post a PET imaging with no definitive evidence of metastatic disease, contralateral submandibular lymph node noted and some small lung nodules.  He was seen by medical oncology and Dr. Gobles Callas at [facility] and recommendation was to consider neoadjuvant cemiplimab followed by consideration for surgical resection and possibly adjuvant radiation.  I think this is a great plan especially with his excellent performance status and since cemiplimab is generally very well-tolerated.  We would be more than happy to assist him with neoadjuvant immunotherapy and coordinate care with Dr. Franchot Heidelberg for further instructions.  I once again discussed the role of cemiplimab, mechanism of action, adverse effects including but not limited to fatigue, hypo-/hyperthyroidism, dry skin and rarely adverse effects such as hepatitis, nephritis, myocarditis and other fatal immunotherapy related adverse effects.  He understands these are extremely uncommon and in general it is very well-tolerated.

## 2022-11-30 NOTE — Progress Notes (Signed)
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  He denies any major complaints except for tenderness when palpated at the right submandibular swelling.  He is otherwise able to eat well, maintaining his weight well, exercising well.  No palpable contralateral lymphadenopathy on exam today however the right submandibular mass has a central scab and is measuring around 3 to 4 cm.  Physical examination otherwise unremarkable.  I agree with the considering cemiplimab for neoadjuvant therapy given unresectable disease at this time.  I have discussed about cemiplimab in detail including mechanism of action, adverse effects fatigue, dry skin, hypo-/hyperthyroidism and rarely visceral organ effects such as hepatitis, nephritis, myocarditis and can be life-threatening in very few circumstances.  I have placed a plan for cemiplimab 350 mg every 21 days.  He will start this in about a week to 10 days.  Anticipate repeat imaging in about 3 cycles and we can coordinate care with Dr. Franchot Heidelberg.  He is very pleased about not having to go all the way to [facility] for these infusions.  Will plan to see him back  before cycle 2-day 1  HISTORY OF PRESENTING ILLNESS:   Samuel Porter 82 y.o. male is here because of right submandibular LN pathology.  This is a very pleasant 82 year old male patient with right jaw/submandibular swelling first noticed in December 2023.  He then had a CT neck with contrast which showed right submandibular space mass measuring approximately 3.2 cm favored to represent a malignant right level 1B lymph node with extracapsular nodal extension.  CT also showed a smaller but malignant appearing contralateral left level 1B lymph node measuring approximately 1.1 cm.  He was then seen by Murray Hodgkins in the diagnostic clinic.  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  PET/CT once again confirmed the right submandibular mass and left submandibular lymph node, no evidence of primary, no definitive distant metastatic disease, small lung nodules noted.  He met with Dr. Lamy Callas at [facility] as well as Dr. Franchot Heidelberg at [facility] oncology.  The recommendation was to consider Cemiplimab neoadjuvantly, follow-up with imaging and consider surgical resection and adjuvant radiation later if needed.  He arrived today to the appointment with his daughter.  He is a good 81, active at baseline, does water exercises every day.  Besides this mass and associated pain, he denies any complaints.  Rest of the pertinent 10 point ROS reviewed and negative  REVIEW OF SYSTEMS:   Constitutional: Denies fevers, chills or abnormal night sweats Eyes: Denies blurriness of vision, double vision or watery eyes Ears, nose, mouth, throat, and face: Denies mucositis or sore throat Respiratory: Denies cough, dyspnea or wheezes Cardiovascular: Denies palpitation, chest discomfort or lower extremity  swelling Gastrointestinal:  Denies nausea, heartburn or change in bowel habits Skin: Denies abnormal skin rashes Lymphatics:  Denies new lymphadenopathy or easy bruising Neurological:Denies numbness, tingling or new weaknesses Behavioral/Psych: Mood is stable, no new changes  All other systems were reviewed with the patient and are negative.  MEDICAL HISTORY:  Past Medical History:  Diagnosis Date   Arthritis    oa   BPH (benign prostatic hyperplasia)    Cancer ([place]) 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   Cataracts, bilateral    Glaucoma    Hypertension    Vitamin D deficiency     SURGICAL HISTORY: Past Surgical History:  Procedure Laterality Date   CHOLECYSTECTOMY     colonscopy  2017   JOINT REPLACEMENT Right    hip    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   Procedure: LEFT TOTAL HIP ARTHROPLASTY ANTERIOR APPROACH;  Surgeon: Gaynelle Arabian, MD;  Location: [facility];  Service: Orthopedics;  Laterality: Left;    SOCIAL HISTORY: Social History   Socioeconomic History   Marital status: Married    Spouse name: Not on file   Number of children: Not on file   Years of education: Not on file   Highest education level: Not on file  Occupational History   Not on file  Tobacco Use   Smoking status: Former    Packs/day: 1.50    Years: 10.00    Total pack years: 15.00    Types: Cigarettes    Quit date: 87    Years since quitting: 54.1   Smokeless tobacco: Never  Substance and Sexual Activity   Alcohol use: Yes    Comment: 1-2 glass wine per day   Drug use: No   Sexual activity: Not on file  Other Topics Concern   Not on file  Social History Narrative   Not on file   Social Determinants of Health   Financial Resource Strain: Not on file  Food Insecurity: No Food Insecurity (11/30/2022)   Hunger Vital Sign    Worried About Running Out of Food in the Last Year: Never true    Ran Out of Food in the Last Year: Never true  Transportation Needs: No Transportation Needs (11/30/2022)   PRAPARE - Hydrologist (Medical): No    Lack of Transportation (Non-Medical):  No  Physical Activity: Not on file  Stress: Not on file  Social Connections: Not on file  Intimate Partner Violence: Not At Risk (11/30/2022)   Humiliation, Afraid, Rape, and Kick questionnaire    Fear of Current or Ex-Partner: No    Emotionally Abused: No    Physically Abused: No    Sexually Abused: No    FAMILY HISTORY: Family History  Problem Relation Age of Onset   Cancer - Ovarian Sister    Lymphoma Sister     ALLERGIES:  has No Known Allergies.  MEDICATIONS:  Current Outpatient Medications  Medication Sig Dispense Refill   amLODipine (NORVASC) 5 MG tablet Take 5 mg by mouth daily.     aspirin EC 81 MG tablet Take 81 mg by mouth daily. Swallow whole.     cholecalciferol (VITAMIN D) 1000 units tablet Take 1,000 Units by mouth as directed. Twice weekly     COVID-19 mRNA Vac-TriS, Pfizer, (PFIZER-BIONT COVID-19 VAC-TRIS) SUSP injection Inject into the muscle. 0.3 mL 0   dorzolamide-timolol (COSOPT) 22.3-6.8 MG/ML ophthalmic solution Place 1 drop into both eyes 2 (two) times daily.  finasteride (PROSCAR) 5 MG tablet Take 5 mg by mouth daily.     guaiFENesin (MUCINEX) 600 MG 12 hr tablet Take 600 mg by mouth 2 (two) times daily as needed for to loosen phlegm.     guaifenesin (ROBITUSSIN) 100 MG/5ML syrup Take 200 mg by mouth 3 (three) times daily as needed for cough.     latanoprost (XALATAN) 0.005 % ophthalmic solution Place 1 drop into both eyes at bedtime.     No current facility-administered medications for this visit.     PHYSICAL EXAMINATION: ECOG PERFORMANCE STATUS: 0 - Asymptomatic  Vitals:   11/30/22 0944  BP: (!) 157/67  Pulse: 62  Resp: 16  Temp: 97.6 F (36.4 C)  SpO2: 98%   Filed Weights   11/30/22 0944  Weight: 197 lb 1.6 oz (89.4 kg)    GENERAL:alert, no distress and comfortable SKIN: skin color, texture, turgor are normal, no rashes or significant lesions EYES: normal, conjunctiva are pink and non-injected, sclera clear OROPHARYNX:no  exudate, no erythema and lips, buccal mucosa, and tongue normal  NECK: supple, thyroid normal size, non-tender, without nodularity LYMPH:  no palpable lymphadenopathy in the cervical, axillary or inguinal LUNGS: clear to auscultation and percussion with normal breathing effort HEART: regular rate & rhythm and no murmurs and no lower extremity edema ABDOMEN:abdomen soft, non-tender and normal bowel sounds Musculoskeletal:no cyanosis of digits and no clubbing  PSYCH: alert & oriented x 3 with fluent speech NEURO: no focal motor/sensory deficits  LABORATORY DATA:  I have reviewed the data as listed Lab Results  Component Value Date   WBC 5.9 11/12/2022   HGB 14.5 11/12/2022   HCT 44.3 11/12/2022   MCV 90.4 11/12/2022   PLT 129 (L) 11/12/2022     Chemistry      Component Value Date/Time   NA 141 11/01/2022 0954   K 4.2 11/01/2022 0954   CL 110 11/01/2022 0954   CO2 27 11/01/2022 0954   BUN 18 11/01/2022 0954   CREATININE 0.91 11/01/2022 0954      Component Value Date/Time   CALCIUM 9.7 11/01/2022 0954   ALKPHOS 57 11/01/2022 0954   AST 17 11/01/2022 0954   ALT 16 11/01/2022 0954   BILITOT 1.1 11/01/2022 0954       RADIOGRAPHIC STUDIES: I have personally reviewed the radiological images as listed and agreed with the findings in the report. NM PET Image Initial (PI) Skull Base To Thigh  Result Date: 11/28/2022 CLINICAL DATA:  Initial treatment strategy for head neck cancer in a male at age 65. Patient found to have poorly differentiated squamous cell carcinoma. EXAM: NUCLEAR MEDICINE PET SKULL BASE TO THIGH TECHNIQUE: 11.3 mCi F-18 FDG was injected intravenously. Full-ring PET imaging was performed from the skull base to thigh after the radiotracer. CT data was obtained and used for attenuation correction and anatomic localization. Fasting blood glucose: 98 mg/dl COMPARISON:  CT of the neck October 25, 2021. FINDINGS: Mediastinal blood pool activity: SUV max 2.79 Liver activity:  SUV max NA NECK: Bulky RIGHT submandibular mass measures 3.2 x 3.1 cm and shows a maximum SUV of 15.5 (image 39/4) Ovoid LEFT submandibular lymph node 14 mm with a maximum SUV of 5.82 (image 38/4) Diffuse Peri lingual or ule cavity uptake contiguous with activity along the base of the tongue and LEFT anterior oral greater than RIGHT uptake is of uncertain significance and could represent pooling of secretions. Dental hardware limiting assessment in the area of the oral cavity. Artifact from multiple dental  fillings. Mild fullness of soft tissue adjacent to the LEFT mandible (image 32/4) this corresponds to increased metabolic activity seen on the same side within the perimandibular soft tissues. Incidental CT findings: None. CHEST: Mild enlargement of subcarinal nodal tissue, mild fullness of RIGHT juxta hilar and RIGHT paratracheal nodal tissue. (Image 81/4) subcarinal lymph node measuring 15 mm short axis with a maximum SUV of 4.0. Scattered small lymph nodes throughout the chest, next largest along the RIGHT paratracheal/perihilar region (image 76/4) 9 mm with slightly less metabolic activity. Pre-vascular lymph node measuring 9 mm with rounded appearance shows no substantial metabolic activity beyond mediastinal blood pool. Small focus of activity in the RIGHT upper lobe (image 68/4) this is amidst bronchovascular structures and without discrete lesion. There are patchy areas of ground-glass and septal thickening throughout the chest. Small nodule in the RIGHT lower lobe (image 96/4) 6 mm without substantial increased metabolic activity. Small nodule in the LEFT lower lobe at 7 mm without substantial increased metabolic activity (image [id number]) present as far back as April of 2025 with enlargement from 4 mm on the prior study. Incidental CT findings: Calcified aortic atherosclerotic changes. Normal heart size. No pericardial effusion. Patchy areas of airspace and interstitial opacity in the chest. Airways are  patent. ABDOMEN/PELVIS: No abnormal hypermetabolic activity within the liver, pancreas, adrenal glands, or spleen. No hypermetabolic lymph nodes in the abdomen or pelvis. Incidental CT findings: No acute findings relative to the liver. Post cholecystectomy. Pancreatic atrophy without signs of inflammation. No acute intra-abdominal or pelvic findings. Pelvic assessment limited by streak artifact from bilateral hip arthroplasty changes. SKELETON: No focal hypermetabolic activity to suggest skeletal metastasis. Incidental CT findings: Spinal degenerative changes. Bilateral hip arthroplasty. IMPRESSION: RIGHT submandibular mass and LEFT submandibular lymph node with increased metabolic activity compatible with known neoplasm. Diffuse oral cavity uptake tracking to the base of the tongue and with subtle increased soft tissue along the buccal aspect of the LEFT mandible and mildly asymmetric activity in this area. Findings are nonspecific but warrant dedicated direct visualization given history of squamous cell carcinoma to exclude oropharyngeal neoplasm. Equivocal appearance of subcarinal nodal tissue in particular, potentially reactive given the appearance of lung parenchyma which raises the question of ongoing viral or atypical process. Correlate clinically with short interval follow-up or biopsy as warranted. Small pulmonary nodules without increased metabolic activity some present and very slightly enlarged since 2005 favor benign or indolent process these areas. Electronically Signed   By: Zetta Bills M.D.   On: 11/28/2022 09:13   [place] CORE BIOPSY (SOFT TISSUE)  Result Date: 11/12/2022 CLINICAL DATA:  Enlarged right submandibular lymph node. The patient presents for biopsy. EXAM: ULTRASOUND GUIDED CORE BIOPSY OF RIGHT SUBMANDIBULAR LYMPH NODE MEDICATIONS: Moderate (conscious) sedation was employed during this procedure. A total of Versed 2.0 mg and Fentanyl 100 mcg was administered intravenously BY RADIOLOGY  NURSING. Moderate Sedation Time: 12 minutes. The patient's level of consciousness and vital signs were monitored continuously by radiology nursing throughout the procedure under my direct supervision. PROCEDURE: The procedure, risks, benefits, and alternatives were explained to the patient. Questions regarding the procedure were encouraged and answered. The patient understands and consents to the procedure. A time out was performed prior to initiating the procedure. Ultrasound was used to localize an enlarged right submandibular lymph node. The right submandibular region was prepped with chlorhexidine in a sterile fashion, and a sterile drape was applied covering the operative field. A sterile gown and sterile gloves were used for the procedure. Local  anesthesia was provided with 1% Lidocaine. Under ultrasound guidance, multiple 18 gauge core biopsy samples were obtained of a right submandibular lymph node. Initially multiple samples were submitted on saline soaked Telfa gauze. Three separate samples were than submitted in formalin. Additional ultrasound was performed. COMPLICATIONS: None. FINDINGS: Enlarged and pathologic appearing right submandibular lymph node appears necrotic and measures just over 3 cm in greatest estimated diameter by ultrasound. Necrotic as well as solid tissue was obtained with core biopsy in different portions of the lymph node. IMPRESSION: Ultrasound-guided core biopsy performed of an enlarged and necrotic right submandibular lymph node. Electronically Signed   By: Aletta Edouard M.D.   On: 11/12/2022 13:58    All questions were answered. The patient knows to call the clinic with any problems, questions or concerns. I spent 65 minutes in the care of this patient including H and P, review of records, counseling and coordination of care.     Benay Pike, MD 11/30/2022 9:50 AM

## 2022-11-30 NOTE — Progress Notes (Signed)
Oncology Nurse Navigator Documentation   Met with patient during initial consult with Dr. Isidore Moos. He was accompanied by his daughter. Further introduced myself as his/their Navigator, explained my role as a member of the Care Team. I will join him for his consult with Dr. Chryl Heck later this morning.  They verbalized understanding of information provided. I encouraged them to call with updates regarding his treatment at [facility] and when/if he was recommended radiation by the providers at [facility].   Harlow Asa, RN, BSN, OCN Head & Neck Oncology Nurse [place] at [place] [phone]

## 2022-11-30 NOTE — Progress Notes (Signed)
START ON PATHWAY REGIMEN - Melanoma and Other Skin Cancers     A cycle is every 21 days:     Cemiplimab-rwlc   **Always confirm dose/schedule in your pharmacy ordering system**  Patient Characteristics: Cutaneous Squamous Cell Carcinoma, Locally Advanced - Unresectable, Systemic Therapy Indicated Disease Classification: Cutaneous Squamous Cell Carcinoma Therapeutic Status: Locally Advanced - Unresectable Click here if multiple primary tumors are present.: false Intent of Therapy: Non-Curative / Palliative Intent, Discussed with Patient

## 2022-12-03 ENCOUNTER — Telehealth: Payer: Self-pay | Admitting: Hematology and Oncology

## 2022-12-03 DIAGNOSIS — L570 Actinic keratosis: Secondary | ICD-10-CM | POA: Diagnosis not present

## 2022-12-03 DIAGNOSIS — D485 Neoplasm of uncertain behavior of skin: Secondary | ICD-10-CM | POA: Diagnosis not present

## 2022-12-03 DIAGNOSIS — L814 Other melanin hyperpigmentation: Secondary | ICD-10-CM | POA: Diagnosis not present

## 2022-12-03 DIAGNOSIS — D044 Carcinoma in situ of skin of scalp and neck: Secondary | ICD-10-CM | POA: Diagnosis not present

## 2022-12-03 DIAGNOSIS — L821 Other seborrheic keratosis: Secondary | ICD-10-CM | POA: Diagnosis not present

## 2022-12-03 DIAGNOSIS — C44329 Squamous cell carcinoma of skin of other parts of face: Secondary | ICD-10-CM | POA: Diagnosis not present

## 2022-12-03 DIAGNOSIS — Z85828 Personal history of other malignant neoplasm of skin: Secondary | ICD-10-CM | POA: Diagnosis not present

## 2022-12-03 NOTE — Progress Notes (Signed)
Pharmacist Chemotherapy Monitoring - Initial Assessment    Anticipated start date: 12/10/22   The following has been reviewed per standard work regarding the patient's treatment regimen: The patient's diagnosis, treatment plan and drug doses, and organ/hematologic function Lab orders and baseline tests specific to treatment regimen  The treatment plan start date, drug sequencing, and pre-medications Prior authorization status  Patient's documented medication list, including drug-drug interaction screen and prescriptions for anti-emetics and supportive care specific to the treatment regimen The drug concentrations, fluid compatibility, administration routes, and timing of the medications to be used The patient's access for treatment and lifetime cumulative dose history, if applicable  The patient's medication allergies and previous infusion related reactions, if applicable   Changes made to treatment plan:  N/A  Follow up needed:  N/A   Philomena Course, RPH, 12/03/2022  1:05 PM

## 2022-12-03 NOTE — Telephone Encounter (Signed)
Spoke with patient confirming upcoming appointments  

## 2022-12-04 ENCOUNTER — Other Ambulatory Visit: Payer: Self-pay

## 2022-12-05 ENCOUNTER — Inpatient Hospital Stay (HOSPITAL_COMMUNITY): Admission: RE | Admit: 2022-12-05 | Payer: Medicare HMO | Source: Ambulatory Visit

## 2022-12-09 ENCOUNTER — Other Ambulatory Visit: Payer: Self-pay

## 2022-12-10 ENCOUNTER — Other Ambulatory Visit: Payer: Self-pay

## 2022-12-10 ENCOUNTER — Ambulatory Visit: Payer: Medicare HMO

## 2022-12-10 ENCOUNTER — Other Ambulatory Visit: Payer: Medicare HMO

## 2022-12-14 DIAGNOSIS — C4492 Squamous cell carcinoma of skin, unspecified: Secondary | ICD-10-CM | POA: Diagnosis not present

## 2022-12-14 DIAGNOSIS — E063 Autoimmune thyroiditis: Secondary | ICD-10-CM | POA: Diagnosis not present

## 2022-12-14 DIAGNOSIS — Z5112 Encounter for antineoplastic immunotherapy: Secondary | ICD-10-CM | POA: Diagnosis not present

## 2022-12-14 DIAGNOSIS — Z7982 Long term (current) use of aspirin: Secondary | ICD-10-CM | POA: Diagnosis not present

## 2022-12-14 DIAGNOSIS — C779 Secondary and unspecified malignant neoplasm of lymph node, unspecified: Secondary | ICD-10-CM | POA: Diagnosis not present

## 2022-12-14 DIAGNOSIS — Z1159 Encounter for screening for other viral diseases: Secondary | ICD-10-CM | POA: Diagnosis not present

## 2022-12-14 DIAGNOSIS — Z87891 Personal history of nicotine dependence: Secondary | ICD-10-CM | POA: Diagnosis not present

## 2022-12-17 ENCOUNTER — Other Ambulatory Visit: Payer: Self-pay

## 2023-01-01 ENCOUNTER — Ambulatory Visit: Payer: Medicare HMO

## 2023-01-01 ENCOUNTER — Ambulatory Visit: Payer: Medicare HMO | Admitting: Hematology and Oncology

## 2023-01-01 ENCOUNTER — Other Ambulatory Visit: Payer: Medicare HMO

## 2023-01-03 DIAGNOSIS — Z7982 Long term (current) use of aspirin: Secondary | ICD-10-CM | POA: Diagnosis not present

## 2023-01-03 DIAGNOSIS — Z87891 Personal history of nicotine dependence: Secondary | ICD-10-CM | POA: Diagnosis not present

## 2023-01-03 DIAGNOSIS — Z5112 Encounter for antineoplastic immunotherapy: Secondary | ICD-10-CM | POA: Diagnosis not present

## 2023-01-03 DIAGNOSIS — Z1159 Encounter for screening for other viral diseases: Secondary | ICD-10-CM | POA: Diagnosis not present

## 2023-01-03 DIAGNOSIS — I10 Essential (primary) hypertension: Secondary | ICD-10-CM | POA: Diagnosis not present

## 2023-01-03 DIAGNOSIS — C4492 Squamous cell carcinoma of skin, unspecified: Secondary | ICD-10-CM | POA: Diagnosis not present

## 2023-01-03 DIAGNOSIS — D696 Thrombocytopenia, unspecified: Secondary | ICD-10-CM | POA: Diagnosis not present

## 2023-01-03 DIAGNOSIS — Z79899 Other long term (current) drug therapy: Secondary | ICD-10-CM | POA: Diagnosis not present

## 2023-01-03 DIAGNOSIS — H409 Unspecified glaucoma: Secondary | ICD-10-CM | POA: Diagnosis not present

## 2023-01-03 DIAGNOSIS — E063 Autoimmune thyroiditis: Secondary | ICD-10-CM | POA: Diagnosis not present

## 2023-01-15 ENCOUNTER — Other Ambulatory Visit: Payer: Self-pay

## 2023-01-24 DIAGNOSIS — E063 Autoimmune thyroiditis: Secondary | ICD-10-CM | POA: Diagnosis not present

## 2023-01-24 DIAGNOSIS — Z5112 Encounter for antineoplastic immunotherapy: Secondary | ICD-10-CM | POA: Diagnosis not present

## 2023-01-24 DIAGNOSIS — C4492 Squamous cell carcinoma of skin, unspecified: Secondary | ICD-10-CM | POA: Diagnosis not present

## 2023-01-24 DIAGNOSIS — Z1159 Encounter for screening for other viral diseases: Secondary | ICD-10-CM | POA: Diagnosis not present

## 2023-02-01 DIAGNOSIS — C4492 Squamous cell carcinoma of skin, unspecified: Secondary | ICD-10-CM | POA: Diagnosis not present

## 2023-02-01 DIAGNOSIS — R591 Generalized enlarged lymph nodes: Secondary | ICD-10-CM | POA: Diagnosis not present

## 2023-02-13 DIAGNOSIS — C4492 Squamous cell carcinoma of skin, unspecified: Secondary | ICD-10-CM | POA: Diagnosis not present

## 2023-02-15 DIAGNOSIS — E063 Autoimmune thyroiditis: Secondary | ICD-10-CM | POA: Diagnosis not present

## 2023-02-15 DIAGNOSIS — C4492 Squamous cell carcinoma of skin, unspecified: Secondary | ICD-10-CM | POA: Diagnosis not present

## 2023-02-15 DIAGNOSIS — Z1159 Encounter for screening for other viral diseases: Secondary | ICD-10-CM | POA: Diagnosis not present

## 2023-03-05 DIAGNOSIS — D485 Neoplasm of uncertain behavior of skin: Secondary | ICD-10-CM | POA: Diagnosis not present

## 2023-03-05 DIAGNOSIS — C44329 Squamous cell carcinoma of skin of other parts of face: Secondary | ICD-10-CM | POA: Diagnosis not present

## 2023-03-05 DIAGNOSIS — Z85828 Personal history of other malignant neoplasm of skin: Secondary | ICD-10-CM | POA: Diagnosis not present

## 2023-03-05 DIAGNOSIS — L821 Other seborrheic keratosis: Secondary | ICD-10-CM | POA: Diagnosis not present

## 2023-03-05 DIAGNOSIS — L570 Actinic keratosis: Secondary | ICD-10-CM | POA: Diagnosis not present

## 2023-03-05 DIAGNOSIS — L814 Other melanin hyperpigmentation: Secondary | ICD-10-CM | POA: Diagnosis not present

## 2023-03-08 DIAGNOSIS — E063 Autoimmune thyroiditis: Secondary | ICD-10-CM | POA: Diagnosis not present

## 2023-03-08 DIAGNOSIS — Z87891 Personal history of nicotine dependence: Secondary | ICD-10-CM | POA: Diagnosis not present

## 2023-03-08 DIAGNOSIS — C4492 Squamous cell carcinoma of skin, unspecified: Secondary | ICD-10-CM | POA: Diagnosis not present

## 2023-03-08 DIAGNOSIS — I10 Essential (primary) hypertension: Secondary | ICD-10-CM | POA: Diagnosis not present

## 2023-03-08 DIAGNOSIS — Z1159 Encounter for screening for other viral diseases: Secondary | ICD-10-CM | POA: Diagnosis not present

## 2023-03-08 DIAGNOSIS — Z7982 Long term (current) use of aspirin: Secondary | ICD-10-CM | POA: Diagnosis not present

## 2023-03-08 DIAGNOSIS — Z5112 Encounter for antineoplastic immunotherapy: Secondary | ICD-10-CM | POA: Diagnosis not present

## 2023-03-08 DIAGNOSIS — D696 Thrombocytopenia, unspecified: Secondary | ICD-10-CM | POA: Diagnosis not present

## 2023-03-08 DIAGNOSIS — E042 Nontoxic multinodular goiter: Secondary | ICD-10-CM | POA: Diagnosis not present

## 2023-03-28 DIAGNOSIS — Z1159 Encounter for screening for other viral diseases: Secondary | ICD-10-CM | POA: Diagnosis not present

## 2023-03-28 DIAGNOSIS — C4492 Squamous cell carcinoma of skin, unspecified: Secondary | ICD-10-CM | POA: Diagnosis not present

## 2023-03-28 DIAGNOSIS — E063 Autoimmune thyroiditis: Secondary | ICD-10-CM | POA: Diagnosis not present

## 2023-03-28 DIAGNOSIS — Z5112 Encounter for antineoplastic immunotherapy: Secondary | ICD-10-CM | POA: Diagnosis not present

## 2023-04-03 DIAGNOSIS — C4492 Squamous cell carcinoma of skin, unspecified: Secondary | ICD-10-CM | POA: Diagnosis not present

## 2023-04-03 DIAGNOSIS — D869 Sarcoidosis, unspecified: Secondary | ICD-10-CM | POA: Diagnosis not present

## 2023-04-03 DIAGNOSIS — J8410 Pulmonary fibrosis, unspecified: Secondary | ICD-10-CM | POA: Diagnosis not present

## 2023-04-03 DIAGNOSIS — I2510 Atherosclerotic heart disease of native coronary artery without angina pectoris: Secondary | ICD-10-CM | POA: Diagnosis not present

## 2023-04-03 DIAGNOSIS — R918 Other nonspecific abnormal finding of lung field: Secondary | ICD-10-CM | POA: Diagnosis not present

## 2023-04-03 DIAGNOSIS — R911 Solitary pulmonary nodule: Secondary | ICD-10-CM | POA: Diagnosis not present

## 2023-04-03 DIAGNOSIS — Z9049 Acquired absence of other specified parts of digestive tract: Secondary | ICD-10-CM | POA: Diagnosis not present

## 2023-04-03 DIAGNOSIS — C770 Secondary and unspecified malignant neoplasm of lymph nodes of head, face and neck: Secondary | ICD-10-CM | POA: Diagnosis not present

## 2023-04-09 DIAGNOSIS — C779 Secondary and unspecified malignant neoplasm of lymph node, unspecified: Secondary | ICD-10-CM | POA: Diagnosis not present

## 2023-04-10 DIAGNOSIS — H401123 Primary open-angle glaucoma, left eye, severe stage: Secondary | ICD-10-CM | POA: Diagnosis not present

## 2023-04-10 DIAGNOSIS — H401111 Primary open-angle glaucoma, right eye, mild stage: Secondary | ICD-10-CM | POA: Diagnosis not present

## 2023-04-15 DIAGNOSIS — Z8579 Personal history of other malignant neoplasms of lymphoid, hematopoietic and related tissues: Secondary | ICD-10-CM | POA: Diagnosis not present

## 2023-04-15 DIAGNOSIS — M96841 Postprocedural hematoma of a musculoskeletal structure following other procedure: Secondary | ICD-10-CM | POA: Diagnosis not present

## 2023-04-15 DIAGNOSIS — C760 Malignant neoplasm of head, face and neck: Secondary | ICD-10-CM | POA: Diagnosis not present

## 2023-04-15 DIAGNOSIS — D6832 Hemorrhagic disorder due to extrinsic circulating anticoagulants: Secondary | ICD-10-CM | POA: Diagnosis not present

## 2023-04-15 DIAGNOSIS — L7631 Postprocedural hematoma of skin and subcutaneous tissue following a dermatologic procedure: Secondary | ICD-10-CM | POA: Diagnosis not present

## 2023-04-15 DIAGNOSIS — C792 Secondary malignant neoplasm of skin: Secondary | ICD-10-CM | POA: Diagnosis not present

## 2023-04-15 DIAGNOSIS — Z85828 Personal history of other malignant neoplasm of skin: Secondary | ICD-10-CM | POA: Diagnosis not present

## 2023-04-15 DIAGNOSIS — C4492 Squamous cell carcinoma of skin, unspecified: Secondary | ICD-10-CM | POA: Diagnosis not present

## 2023-04-15 DIAGNOSIS — I4891 Unspecified atrial fibrillation: Secondary | ICD-10-CM | POA: Diagnosis not present

## 2023-04-15 DIAGNOSIS — I97638 Postprocedural hematoma of a circulatory system organ or structure following other circulatory system procedure: Secondary | ICD-10-CM | POA: Diagnosis not present

## 2023-04-15 DIAGNOSIS — I898 Other specified noninfective disorders of lymphatic vessels and lymph nodes: Secondary | ICD-10-CM | POA: Diagnosis not present

## 2023-04-15 DIAGNOSIS — I10 Essential (primary) hypertension: Secondary | ICD-10-CM | POA: Diagnosis not present

## 2023-04-15 DIAGNOSIS — H409 Unspecified glaucoma: Secondary | ICD-10-CM | POA: Diagnosis not present

## 2023-04-15 DIAGNOSIS — N400 Enlarged prostate without lower urinary tract symptoms: Secondary | ICD-10-CM | POA: Diagnosis not present

## 2023-04-15 DIAGNOSIS — E042 Nontoxic multinodular goiter: Secondary | ICD-10-CM | POA: Diagnosis not present

## 2023-04-15 DIAGNOSIS — E039 Hypothyroidism, unspecified: Secondary | ICD-10-CM | POA: Diagnosis not present

## 2023-04-15 DIAGNOSIS — Z9225 Personal history of immunosupression therapy: Secondary | ICD-10-CM | POA: Diagnosis not present

## 2023-04-20 ENCOUNTER — Other Ambulatory Visit: Payer: Self-pay

## 2023-05-07 DIAGNOSIS — Z09 Encounter for follow-up examination after completed treatment for conditions other than malignant neoplasm: Secondary | ICD-10-CM | POA: Diagnosis not present

## 2023-05-07 DIAGNOSIS — I10 Essential (primary) hypertension: Secondary | ICD-10-CM | POA: Diagnosis not present

## 2023-05-07 DIAGNOSIS — R634 Abnormal weight loss: Secondary | ICD-10-CM | POA: Diagnosis not present

## 2023-05-07 DIAGNOSIS — Z8589 Personal history of malignant neoplasm of other organs and systems: Secondary | ICD-10-CM | POA: Diagnosis not present

## 2023-05-07 DIAGNOSIS — C4492 Squamous cell carcinoma of skin, unspecified: Secondary | ICD-10-CM | POA: Diagnosis not present

## 2023-06-06 DIAGNOSIS — L821 Other seborrheic keratosis: Secondary | ICD-10-CM | POA: Diagnosis not present

## 2023-06-06 DIAGNOSIS — Z85828 Personal history of other malignant neoplasm of skin: Secondary | ICD-10-CM | POA: Diagnosis not present

## 2023-06-06 DIAGNOSIS — L570 Actinic keratosis: Secondary | ICD-10-CM | POA: Diagnosis not present

## 2023-06-06 DIAGNOSIS — L309 Dermatitis, unspecified: Secondary | ICD-10-CM | POA: Diagnosis not present

## 2023-06-06 DIAGNOSIS — C44329 Squamous cell carcinoma of skin of other parts of face: Secondary | ICD-10-CM | POA: Diagnosis not present

## 2023-06-06 DIAGNOSIS — L820 Inflamed seborrheic keratosis: Secondary | ICD-10-CM | POA: Diagnosis not present

## 2023-06-06 DIAGNOSIS — D0422 Carcinoma in situ of skin of left ear and external auricular canal: Secondary | ICD-10-CM | POA: Diagnosis not present

## 2023-06-15 ENCOUNTER — Other Ambulatory Visit: Payer: Self-pay

## 2023-07-04 DIAGNOSIS — D649 Anemia, unspecified: Secondary | ICD-10-CM | POA: Diagnosis not present

## 2023-07-04 DIAGNOSIS — E559 Vitamin D deficiency, unspecified: Secondary | ICD-10-CM | POA: Diagnosis not present

## 2023-07-04 DIAGNOSIS — I10 Essential (primary) hypertension: Secondary | ICD-10-CM | POA: Diagnosis not present

## 2023-07-05 DIAGNOSIS — L249 Irritant contact dermatitis, unspecified cause: Secondary | ICD-10-CM | POA: Diagnosis not present

## 2023-07-05 DIAGNOSIS — L0390 Cellulitis, unspecified: Secondary | ICD-10-CM | POA: Diagnosis not present

## 2023-07-08 DIAGNOSIS — L0390 Cellulitis, unspecified: Secondary | ICD-10-CM | POA: Diagnosis not present

## 2023-07-11 ENCOUNTER — Inpatient Hospital Stay (HOSPITAL_BASED_OUTPATIENT_CLINIC_OR_DEPARTMENT_OTHER)
Admission: EM | Admit: 2023-07-11 | Discharge: 2023-07-16 | DRG: 354 | Disposition: A | Discharge status: Home / Self Care | Payer: Medicare HMO | Attending: General Surgery | Admitting: General Surgery

## 2023-07-11 ENCOUNTER — Other Ambulatory Visit: Payer: Self-pay

## 2023-07-11 ENCOUNTER — Other Ambulatory Visit (HOSPITAL_COMMUNITY): Payer: Self-pay | Admitting: Family Medicine

## 2023-07-11 ENCOUNTER — Emergency Department (HOSPITAL_BASED_OUTPATIENT_CLINIC_OR_DEPARTMENT_OTHER): Payer: Medicare HMO

## 2023-07-11 ENCOUNTER — Encounter (HOSPITAL_BASED_OUTPATIENT_CLINIC_OR_DEPARTMENT_OTHER): Payer: Self-pay | Admitting: Emergency Medicine

## 2023-07-11 DIAGNOSIS — R1110 Vomiting, unspecified: Secondary | ICD-10-CM

## 2023-07-11 DIAGNOSIS — K420 Umbilical hernia with obstruction, without gangrene: Secondary | ICD-10-CM | POA: Diagnosis not present

## 2023-07-11 DIAGNOSIS — I10 Essential (primary) hypertension: Secondary | ICD-10-CM | POA: Diagnosis not present

## 2023-07-11 DIAGNOSIS — Z8589 Personal history of malignant neoplasm of other organs and systems: Secondary | ICD-10-CM | POA: Diagnosis not present

## 2023-07-11 DIAGNOSIS — R918 Other nonspecific abnormal finding of lung field: Secondary | ICD-10-CM | POA: Diagnosis not present

## 2023-07-11 DIAGNOSIS — Z0000 Encounter for general adult medical examination without abnormal findings: Secondary | ICD-10-CM | POA: Diagnosis not present

## 2023-07-11 DIAGNOSIS — N400 Enlarged prostate without lower urinary tract symptoms: Secondary | ICD-10-CM | POA: Diagnosis present

## 2023-07-11 DIAGNOSIS — E872 Acidosis, unspecified: Secondary | ICD-10-CM | POA: Diagnosis present

## 2023-07-11 DIAGNOSIS — E559 Vitamin D deficiency, unspecified: Secondary | ICD-10-CM | POA: Diagnosis present

## 2023-07-11 DIAGNOSIS — Z96642 Presence of left artificial hip joint: Secondary | ICD-10-CM | POA: Diagnosis not present

## 2023-07-11 DIAGNOSIS — E871 Hypo-osmolality and hyponatremia: Secondary | ICD-10-CM | POA: Diagnosis present

## 2023-07-11 DIAGNOSIS — R000 Tachycardia, unspecified: Secondary | ICD-10-CM | POA: Diagnosis not present

## 2023-07-11 DIAGNOSIS — H409 Unspecified glaucoma: Secondary | ICD-10-CM | POA: Diagnosis not present

## 2023-07-11 DIAGNOSIS — R7989 Other specified abnormal findings of blood chemistry: Secondary | ICD-10-CM

## 2023-07-11 DIAGNOSIS — N179 Acute kidney failure, unspecified: Secondary | ICD-10-CM | POA: Diagnosis not present

## 2023-07-11 DIAGNOSIS — K5669 Other partial intestinal obstruction: Secondary | ICD-10-CM | POA: Diagnosis not present

## 2023-07-11 DIAGNOSIS — Z807 Family history of other malignant neoplasms of lymphoid, hematopoietic and related tissues: Secondary | ICD-10-CM

## 2023-07-11 DIAGNOSIS — Z9049 Acquired absence of other specified parts of digestive tract: Secondary | ICD-10-CM

## 2023-07-11 DIAGNOSIS — Z79899 Other long term (current) drug therapy: Secondary | ICD-10-CM

## 2023-07-11 DIAGNOSIS — R7309 Other abnormal glucose: Secondary | ICD-10-CM | POA: Diagnosis not present

## 2023-07-11 DIAGNOSIS — Z85820 Personal history of malignant melanoma of skin: Secondary | ICD-10-CM | POA: Diagnosis not present

## 2023-07-11 DIAGNOSIS — Z85828 Personal history of other malignant neoplasm of skin: Secondary | ICD-10-CM | POA: Diagnosis not present

## 2023-07-11 DIAGNOSIS — K5900 Constipation, unspecified: Secondary | ICD-10-CM | POA: Diagnosis not present

## 2023-07-11 DIAGNOSIS — K439 Ventral hernia without obstruction or gangrene: Secondary | ICD-10-CM | POA: Diagnosis not present

## 2023-07-11 DIAGNOSIS — Z7982 Long term (current) use of aspirin: Secondary | ICD-10-CM | POA: Diagnosis not present

## 2023-07-11 DIAGNOSIS — Z87891 Personal history of nicotine dependence: Secondary | ICD-10-CM | POA: Diagnosis not present

## 2023-07-11 DIAGNOSIS — M1990 Unspecified osteoarthritis, unspecified site: Secondary | ICD-10-CM | POA: Diagnosis not present

## 2023-07-11 DIAGNOSIS — I4719 Other supraventricular tachycardia: Secondary | ICD-10-CM

## 2023-07-11 DIAGNOSIS — K56609 Unspecified intestinal obstruction, unspecified as to partial versus complete obstruction: Secondary | ICD-10-CM | POA: Diagnosis not present

## 2023-07-11 LAB — I-STAT VENOUS BLOOD GAS, ED
Acid-Base Excess: 9 mmol/L — ABNORMAL HIGH (ref 0.0–2.0)
Bicarbonate: 38.1 mmol/L — ABNORMAL HIGH (ref 20.0–28.0)
Calcium, Ion: 1.25 mmol/L (ref 1.15–1.40)
HCT: 51 % (ref 39.0–52.0)
Hemoglobin: 17.3 g/dL — ABNORMAL HIGH (ref 13.0–17.0)
O2 Saturation: 54 %
Potassium: 4.7 mmol/L (ref 3.5–5.1)
Sodium: 131 mmol/L — ABNORMAL LOW (ref 135–145)
TCO2: 40 mmol/L — ABNORMAL HIGH (ref 22–32)
pCO2, Ven: 68.4 mmHg — ABNORMAL HIGH (ref 44–60)
pH, Ven: 7.354 (ref 7.25–7.43)
pO2, Ven: 31 mmHg — CL (ref 32–45)

## 2023-07-11 LAB — LACTIC ACID, PLASMA
Lactic Acid, Venous: 3 mmol/L (ref 0.5–1.9)
Lactic Acid, Venous: 1.8 mmol/L (ref 0.5–1.9)

## 2023-07-11 LAB — CBC WITH DIFFERENTIAL/PLATELET
Abs Immature Granulocytes: 0.06 10*3/uL (ref 0.00–0.07)
Basophils Absolute: 0 10*3/uL (ref 0.0–0.1)
Basophils Relative: 0 %
Eosinophils Absolute: 0 10*3/uL (ref 0.0–0.5)
Eosinophils Relative: 0 %
HCT: 48.5 % (ref 39.0–52.0)
Hemoglobin: 16 g/dL (ref 13.0–17.0)
Immature Granulocytes: 1 %
Lymphocytes Relative: 7 %
Lymphs Abs: 0.9 10*3/uL (ref 0.7–4.0)
MCH: 28.9 pg (ref 26.0–34.0)
MCHC: 33 g/dL (ref 30.0–36.0)
MCV: 87.5 fL (ref 80.0–100.0)
Monocytes Absolute: 1.3 10*3/uL — ABNORMAL HIGH (ref 0.1–1.0)
Monocytes Relative: 10 %
Neutro Abs: 11 10*3/uL — ABNORMAL HIGH (ref 1.7–7.7)
Neutrophils Relative %: 82 %
Platelets: 237 10*3/uL (ref 150–400)
RBC: 5.54 MIL/uL (ref 4.22–5.81)
RDW: 13.8 % (ref 11.5–15.5)
WBC: 13.3 10*3/uL — ABNORMAL HIGH (ref 4.0–10.5)
nRBC: 0 % (ref 0.0–0.2)

## 2023-07-11 LAB — BRAIN NATRIURETIC PEPTIDE: B Natriuretic Peptide: 232.3 pg/mL — ABNORMAL HIGH (ref 0.0–100.0)

## 2023-07-11 LAB — COMPREHENSIVE METABOLIC PANEL
ALT: 15 U/L (ref 0–44)
AST: 17 U/L (ref 15–41)
Albumin: 3.9 g/dL (ref 3.5–5.0)
Alkaline Phosphatase: 73 U/L (ref 38–126)
Anion gap: 8 (ref 5–15)
BUN: 39 mg/dL — ABNORMAL HIGH (ref 8–23)
CO2: 26 mmol/L (ref 22–32)
Calcium: 10.8 mg/dL — ABNORMAL HIGH (ref 8.9–10.3)
Chloride: 95 mmol/L — ABNORMAL LOW (ref 98–111)
Creatinine, Ser: 1.68 mg/dL — ABNORMAL HIGH (ref 0.61–1.24)
GFR, Estimated: 40 mL/min — ABNORMAL LOW (ref >60)
Glucose, Bld: 155 mg/dL — ABNORMAL HIGH (ref 70–99)
Potassium: 4.5 mmol/L (ref 3.5–5.1)
Sodium: 129 mmol/L — ABNORMAL LOW (ref 135–145)
Total Bilirubin: 1.1 mg/dL (ref 0.3–1.2)
Total Protein: 7.8 g/dL (ref 6.5–8.1)

## 2023-07-11 LAB — MAGNESIUM: Magnesium: 2.3 mg/dL (ref 1.7–2.4)

## 2023-07-11 LAB — PROTIME-INR
INR: 1.2 (ref 0.8–1.2)
Prothrombin Time: 15.2 seconds (ref 11.4–15.2)

## 2023-07-11 LAB — TROPONIN I (HIGH SENSITIVITY)
Troponin I (High Sensitivity): 39 ng/L — ABNORMAL HIGH (ref <18)
Troponin I (High Sensitivity): 76 ng/L — ABNORMAL HIGH (ref <18)

## 2023-07-11 LAB — LIPASE, BLOOD: Lipase: 12 U/L (ref 11–51)

## 2023-07-11 MED ORDER — ONDANSETRON HCL 4 MG/2ML IJ SOLN
4.0000 mg | Freq: Once | INTRAMUSCULAR | Status: DC
  Filled 2023-07-11: qty 2

## 2023-07-11 MED ORDER — FENTANYL CITRATE PF 50 MCG/ML IJ SOSY
50.0000 ug | PREFILLED_SYRINGE | Freq: Once | INTRAMUSCULAR | Status: AC
  Administered 2023-07-11: 50 ug via INTRAVENOUS
  Filled 2023-07-11 (×2): qty 1

## 2023-07-11 MED ORDER — SODIUM CHLORIDE 0.9 % IV SOLN: Freq: Once | INTRAVENOUS | Status: AC

## 2023-07-11 MED ORDER — PIPERACILLIN-TAZOBACTAM 3.375 G IVPB 30 MIN
3.3750 g | Freq: Once | INTRAVENOUS | Status: AC
  Administered 2023-07-11: 3.375 g via INTRAVENOUS
  Filled 2023-07-11: qty 50

## 2023-07-11 MED ORDER — IOHEXOL 300 MG/ML  SOLN
100.0000 mL | Freq: Once | INTRAMUSCULAR | Status: AC | PRN
  Administered 2023-07-11: 85 mL via INTRAVENOUS

## 2023-07-11 MED ORDER — SODIUM CHLORIDE 0.9 % IV BOLUS
1000.0000 mL | Freq: Once | INTRAVENOUS | Status: AC
  Administered 2023-07-11: 1000 mL via INTRAVENOUS

## 2023-07-11 NOTE — ED Notes (Signed)
Report given to the next RN... 

## 2023-07-11 NOTE — ED Provider Notes (Signed)
8:51 PM patient arrives from [facility] with an incarcerated umbilical hernia, small bowel obstruction, here for general surgery evaluation.  He also had narrow complex tachycardia upon arrival to the ED, question SVT versus A-fib, spontaneously resolved in ED with frequent ectopy afterwards. Will repeat EKG.   Patient currently comfortable, no active vomiting.  He has a firm hard umbilical hernia that is moderately tender.  Page placed to general surgery.  Reviewed lab work.  Patient appears stable at this time.  BP (!) 172/97   Pulse (!) 51   Temp 98 F (36.7 C) (Oral)   Resp 20   Ht 6\' 1"  (1.854 m)   Wt 70.3 kg   SpO2 97%   BMI 20.45 kg/m   9:09 PM Spoke with Dr. Donell Beers aware of patient in ED.   Troponin 39 >> 76.   10:42 PM Surgery has placed admit orders.     Renne Crigler, PA-C 07/11/23 2242    Rondel Baton, MD 07/12/23 1520

## 2023-07-11 NOTE — ED Notes (Signed)
RT Note; VBG completed and handed to Dr. Charm Barges

## 2023-07-11 NOTE — ED Notes (Signed)
Pt placed in reverse trendelenburg and ice placed on belly button/hernia. Per Provider.Marland KitchenMarland Kitchen

## 2023-07-11 NOTE — ED Notes (Signed)
Pt aware of the need for a urine... Pt unable to provide the sample.Marland KitchenMarland Kitchen

## 2023-07-11 NOTE — ED Notes (Signed)
Provider aware of the refusal of meds... Provider also informed of the multi PVC in the Pt's EKG.Marland KitchenMarland Kitchen

## 2023-07-11 NOTE — ED Notes (Signed)
ED Provider at bedside. 

## 2023-07-11 NOTE — ED Notes (Signed)
ED TO INPATIENT HANDOFF REPORT  ED Nurse Name and Phone #: Rodney Booze [phone]  S Name/Age/Gender Samuel Porter 82 y.o. male Room/Bed: 029C/029C  Code Status   Code Status: Full Code  Home/SNF/Other Home Patient oriented to: self, place, time, and situation Is this baseline? Yes   Triage Complete: Triage complete  Chief Complaint Incarcerated umbilical hernia [K42.0]  Triage Note Pt c/o tachycardia today, referred by PCP. Denies CP or shob. HR 136 in triage. Also reports constipation   Allergies No Known Allergies  Level of Care/Admitting Diagnosis ED Disposition     ED Disposition  Admit   Condition  --   Comment  Hospital Area: [facility] [[id number]]  Level of Care: Med-Surg [16]  May admit patient to [facility] or [facility] if equivalent level of care is available:: No  Covid Evaluation: Asymptomatic - no recent exposure (last 10 days) testing not required  Diagnosis: Incarcerated umbilical hernia [098119]  Admitting Physician: CCS, MD [[id number]]  Attending Physician: CCS, MD [[id number]]  Certification:: I certify this patient will need inpatient services for at least 2 midnights  Expected Medical Readiness: 07/14/2023          B Medical/Surgery History Past Medical History:  Diagnosis Date   Arthritis    oa   BPH (benign prostatic hyperplasia)    Cancer (HCC) 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   Cataracts, bilateral    Glaucoma    Hypertension    Vitamin D deficiency    Past Surgical History:  Procedure Laterality Date   CHOLECYSTECTOMY     colonscopy  2017   JOINT REPLACEMENT Right    hip    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   Procedure: LEFT TOTAL HIP ARTHROPLASTY ANTERIOR APPROACH;  Surgeon: Ollen Gross, MD;  Location: [facility];  Service: Orthopedics;  Laterality: Left;     A IV Location/Drains/Wounds Patient Lines/Drains/Airways Status     Active Line/Drains/Airways     Name Placement date Placement time Site  Days   Peripheral IV 07/11/23 20 G Right Antecubital 07/11/23  1653  Antecubital  less than 1   Peripheral IV 07/11/23 20 G 1" Anterior;Left;Proximal Forearm 07/11/23  1831  Forearm  less than 1            Intake/Output Last 24 hours  Intake/Output Summary (Last 24 hours) at 07/11/2023 2250 Last data filed at 07/11/2023 1824 Gross per 24 hour  Intake 1050 ml  Output --  Net 1050 ml    Labs/Imaging Results for orders placed or performed during the hospital encounter of 07/11/23 (from the past 48 hour(s))  Magnesium     Status: None   Collection Time: 07/11/23  4:50 PM  Result Value Ref Range   Magnesium 2.3 1.7 - 2.4 mg/dL    Comment: Performed at [facility], [address], [place], [place] [address]  Comprehensive metabolic panel     Status: Abnormal   Collection Time: 07/11/23  4:53 PM  Result Value Ref Range   Sodium 129 (L) 135 - 145 mmol/L   Potassium 4.5 3.5 - 5.1 mmol/L   Chloride 95 (L) 98 - 111 mmol/L   CO2 26 22 - 32 mmol/L   Glucose, Bld 155 (H) 70 - 99 mg/dL    Comment: Glucose reference range applies only to samples taken after fasting for at least 8 hours.   BUN 39 (H) 8 - 23 mg/dL   Creatinine, Ser 9.56 (H) 0.61 - 1.24  mg/dL   Calcium 95.2 (H) 8.9 - 10.3 mg/dL   Total Protein 7.8 6.5 - 8.1 g/dL   Albumin 3.9 3.5 - 5.0 g/dL   AST 17 15 - 41 U/L   ALT 15 0 - 44 U/L   Alkaline Phosphatase 73 38 - 126 U/L   Total Bilirubin 1.1 0.3 - 1.2 mg/dL   GFR, Estimated 40 (L) >60 mL/min    Comment: (NOTE) Calculated using the CKD-EPI Creatinine Equation (2021)    Anion gap 8 5 - 15    Comment: Performed at [facility], [address] [place], [place] [address]  CBC with Differential     Status: Abnormal   Collection Time: 07/11/23  4:53 PM  Result Value Ref Range   WBC 13.3 (H) 4.0 - 10.5 K/uL   RBC 5.54 4.22 - 5.81 MIL/uL   Hemoglobin 16.0 13.0 - 17.0 g/dL   HCT 44.0 10.2 - 72.5 %   MCV 87.5 80.0 - 100.0 fL   MCH  28.9 26.0 - 34.0 pg   MCHC 33.0 30.0 - 36.0 g/dL   RDW 36.6 44.0 - 34.7 %   Platelets 237 150 - 400 K/uL   nRBC 0.0 0.0 - 0.2 %   Neutrophils Relative % 82 %   Neutro Abs 11.0 (H) 1.7 - 7.7 K/uL   Lymphocytes Relative 7 %   Lymphs Abs 0.9 0.7 - 4.0 K/uL   Monocytes Relative 10 %   Monocytes Absolute 1.3 (H) 0.1 - 1.0 K/uL   Eosinophils Relative 0 %   Eosinophils Absolute 0.0 0.0 - 0.5 K/uL   Basophils Relative 0 %   Basophils Absolute 0.0 0.0 - 0.1 K/uL   Immature Granulocytes 1 %   Abs Immature Granulocytes 0.06 0.00 - 0.07 K/uL    Comment: Performed at [facility], [address], [place], [place] [address]  Troponin I (High Sensitivity)     Status: Abnormal   Collection Time: 07/11/23  4:53 PM  Result Value Ref Range   Troponin I (High Sensitivity) 39 (H) <18 ng/L    Comment: (NOTE) Elevated high sensitivity troponin I (hsTnI) values and significant  changes across serial measurements may suggest ACS but many other  chronic and acute conditions are known to elevate hsTnI results.  Refer to the "Links" section for chest pain algorithms and additional  guidance. Performed at [facility], [address], [place], [place] [address]   Brain natriuretic peptide     Status: Abnormal   Collection Time: 07/11/23  4:53 PM  Result Value Ref Range   B Natriuretic Peptide 232.3 (H) 0.0 - 100.0 pg/mL    Comment: Performed at [facility], [address], [place], [place] [address]  Lipase, blood     Status: None   Collection Time: 07/11/23  4:53 PM  Result Value Ref Range   Lipase 12 11 - 51 U/L    Comment: Performed at [facility], [address] [place], [place] [address]  Lactic acid, plasma     Status: Abnormal   Collection Time: 07/11/23  4:53 PM  Result Value Ref Range   Lactic Acid, Venous 3.0 (HH) 0.5 - 1.9 mmol/L    Comment: CRITICAL RESULT CALLED TO, READ BACK BY AND VERIFIED WITH: JERRI  PEGRAM RN,07/11/23 @ 1727 BY JYANG Performed at [facility], [address] [place], [place] [address]   Protime-INR     Status: None   Collection Time: 07/11/23  4:53 PM  Result Value Ref Range   Prothrombin Time 15.2 11.4 - 15.2 seconds   INR 1.2 0.8 - 1.2    Comment: (NOTE) INR goal varies based on device and disease states. Performed at [facility], [address], [place], [place] [address]   I-Stat venous blood gas, ED     Status: Abnormal   Collection Time: 07/11/23  4:59 PM  Result Value Ref Range   pH, Ven 7.354 7.25 - 7.43   pCO2, Ven 68.4 (H) 44 - 60 mmHg   pO2, Ven 31 (LL) 32 - 45 mmHg   Bicarbonate 38.1 (H) 20.0 - 28.0 mmol/L   TCO2 40 (H) 22 - 32 mmol/L   O2 Saturation 54 %   Acid-Base Excess 9.0 (H) 0.0 - 2.0 mmol/L   Sodium 131 (L) 135 - 145 mmol/L   Potassium 4.7 3.5 - 5.1 mmol/L   Calcium, Ion 1.25 1.15 - 1.40 mmol/L   HCT 51.0 39.0 - 52.0 %   Hemoglobin 17.3 (H) 13.0 - 17.0 g/dL   Collection site IV start    Drawn by HIDE    Sample type VENOUS    Comment NOTIFIED PHYSICIAN   Lactic acid, plasma     Status: None   Collection Time: 07/11/23  7:33 PM  Result Value Ref Range   Lactic Acid, Venous 1.8 0.5 - 1.9 mmol/L    Comment: Performed at [facility], [address] [place], [place] [address]  Troponin I (High Sensitivity)     Status: Abnormal   Collection Time: 07/11/23  7:33 PM  Result Value Ref Range   Troponin I (High Sensitivity) 76 (H) <18 ng/L    Comment: (NOTE) Elevated high sensitivity troponin I (hsTnI) values and significant  changes across serial measurements may suggest ACS but many other  chronic and acute conditions are known to elevate hsTnI results.  Refer to the Links section for chest pain algorithms and additional  guidance. Performed at [facility], [address] [place], [place] [address]    DG Chest Port 1 View  Result Date:  07/11/2023 CLINICAL DATA:  Tachycardia EXAM: PORTABLE CHEST 1 VIEW COMPARISON:  Chest x-ray 09/21/2020.  PET-CT 11/27/2022. FINDINGS: There some scattered interstitial opacities in both lungs which appear similar to prior study and are favored as chronic. There is no new focal lung consolidation, pleural effusion or pneumothorax. The cardiomediastinal silhouette is within normal limits no acute fractures are seen. IMPRESSION: Scattered interstitial opacities in both lungs appear similar to prior study and are favored as chronic. No new focal lung consolidation. Electronically Signed   By: Darliss Cheney M.D.   On: 07/11/2023 19:20   CT ABDOMEN PELVIS W CONTRAST  Addendum Date: 07/11/2023   ADDENDUM REPORT: 07/11/2023 19:11 ADDENDUM: These results were called by telephone at the time of interpretation on 07/11/2023 at 7:11 pm to provider [facility] , who verbally acknowledged these results. Electronically Signed   By: Darliss Cheney M.D.   On: 07/11/2023 19:11   Result Date: 07/11/2023 CLINICAL DATA:  Tachycardia and constipation. Evaluate for bowel obstruction. History of head and neck cancer. EXAM: CT ABDOMEN AND PELVIS WITH CONTRAST TECHNIQUE: Multidetector CT imaging of the abdomen and pelvis was performed using the standard protocol following bolus administration of intravenous contrast. RADIATION DOSE REDUCTION: This exam was performed according to the departmental dose-optimization program which includes automated exposure control, adjustment of the mA and/or kV according to patient size and/or use of iterative reconstruction technique. CONTRAST:  85mL  OMNIPAQUE IOHEXOL 300 MG/ML  SOLN COMPARISON:  PET-CT 11/27/2022. FINDINGS: Lower chest: No acute abnormality. Hepatobiliary: No focal liver abnormality is seen. Status post cholecystectomy. No biliary dilatation. Pancreas: Unremarkable. No pancreatic ductal dilatation or surrounding inflammatory changes. Spleen: Normal in size without focal abnormality.  Adrenals/Urinary Tract: Adrenal glands are unremarkable. Kidneys are normal, without renal calculi, focal lesion, or hydronephrosis. Bladder is unremarkable. Stomach/Bowel: There is an umbilical hernia containing a dilated small bowel loop. This is transition point for small bowel obstruction. Small bowel loops proximal to this level are dilated with air-fluid levels measuring up to 4.2 cm. There is mild mesenteric edema. There is a moderate air-fluid level in the stomach as well. Small-bowel loops distal to this level are decompressed. The colon is nondilated. The appendix appears normal. There is no pneumatosis or free air. Vascular/Lymphatic: No significant vascular findings are present. No enlarged abdominal or pelvic lymph nodes. Reproductive: Prostate gland is enlarged. Other: There is a small amount of free fluid in the pelvis. Musculoskeletal: Degenerative changes affect the spine. Bilateral hip arthroplasties are present. IMPRESSION: Umbilical hernia containing a dilated small bowel loop causing small bowel obstruction. Findings are compatible with incarcerated/strangulated hernia. There is mild mesenteric edema and small amount of free fluid in the pelvis. No pneumatosis or free air. Electronically Signed: By: Darliss Cheney M.D. On: 07/11/2023 19:08    Pending Labs Unresulted Labs (From admission, onward)     Start     Ordered   07/11/23 1729  Culture, blood (routine x 2)  BLOOD CULTURE X 2,   STAT      07/11/23 1729   07/11/23 1644  Urinalysis, Routine w reflex microscopic -Urine, Clean Catch  Once,   URGENT       Question:  Specimen Source  Answer:  Urine, Clean Catch   07/11/23 1645   Signed and Held  CBC  (enoxaparin (LOVENOX)    CrCl >/= 30 ml/min)  Once,   R       Comments: Baseline for enoxaparin therapy IF NOT ALREADY DRAWN.  Notify MD if PLT < 100 K.    Signed and Held   Signed and Held  Creatinine, serum  (enoxaparin (LOVENOX)    CrCl >/= 30 ml/min)  Once,   R       Comments:  Baseline for enoxaparin therapy IF NOT ALREADY DRAWN.    Signed and Held   Signed and Held  Creatinine, serum  (enoxaparin (LOVENOX)    CrCl >/= 30 ml/min)  Weekly,   R     Comments: while on enoxaparin therapy    Signed and Held   Signed and Held  Basic metabolic panel  Tomorrow morning,   R        Signed and Held   Signed and Held  CBC  Tomorrow morning,   R        Signed and Held            Vitals/Pain Today's Vitals   07/11/23 1900 07/11/23 1915 07/11/23 1954 07/11/23 2028  BP: 136/81   (!) 172/97  Pulse: 80   (!) 51  Resp: 12   20  Temp:    98 F (36.7 C)  TempSrc:    Oral  SpO2: 97%   97%  Weight:      Height:      PainSc:  2  2      Isolation Precautions No active isolations  Medications Medications  ondansetron (ZOFRAN) injection 4 mg (4  mg Intravenous Patient Refused/Not Given 07/11/23 1708)  sodium chloride 0.9 % bolus 1,000 mL (0 mLs Intravenous Stopped 07/11/23 1746)  fentaNYL (SUBLIMAZE) injection 50 mcg (50 mcg Intravenous Given 07/11/23 1838)  piperacillin-tazobactam (ZOSYN) IVPB 3.375 g (0 g Intravenous Stopped 07/11/23 1824)  iohexol (OMNIPAQUE) 300 MG/ML solution 100 mL (85 mLs Intravenous Contrast Given 07/11/23 1806)  0.9 %  sodium chloride infusion ( Intravenous New Bag/Given 07/11/23 1932)    Mobility walks     Focused Assessments Cardiac Assessment Handoff:  Cardiac Rhythm: Other (Comment) (PVC) No results found for: "CKTOTAL", "CKMB", "CKMBINDEX", "TROPONINI" Lab Results  Component Value Date   DDIMER 0.38 06/17/2018   Does the Patient currently have chest pain? No    R Recommendations: See Admitting Provider Note  Report given to:   Additional Notes:

## 2023-07-11 NOTE — ED Triage Notes (Addendum)
Pt c/o tachycardia today, referred by PCP. Denies CP or shob. HR 136 in triage. Also reports constipation

## 2023-07-11 NOTE — ED Notes (Signed)
-  Call[facility] for transportation to [facility] ED - Dr. Dalene Seltzer Accepting. [facility] is in the middle of shift change then will dispatch a truck.

## 2023-07-11 NOTE — H&P (Signed)
Samuel Porter is an 82 y.o. male.   Chief Complaint: abdominal pain HPI:  Patient is an 82 year old male who presented to the emergency department [place] with complaints of abdominal pain, nausea, and vomiting that been going on for 48 hours.  The patient has had umbilical hernia for many years.  It is always sticking out and he has not been able to get it to reduce for quite a long time.  He has been extremely constipated recently as well.  He feels that this has contributed to the hernia.  He reports that the hernia looks the same as it has for the past 4 days.  If anything, he states that it is smaller.  Patient denies any significant abdominal pain.  Of note he has had a significant weight loss over the last year due to diagnosis with head and neck cancer.  He underwent immunotherapy at [facility] and has had good response to the treatment, but no longer has any significant ability to taste.  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 His smoking use included cigarettes. He started smoking about 64 years ago. He has a 15 pack-year smoking history. He has never used smokeless tobacco. He reports that he does not currently use alcohol. He reports that he does not use drugs.  Allergies: No Known Allergies  Meds:   amLODipine  (NORVASC) 5 MG tablet aspirin EC 81 MG tablet cholecalciferol (VITAMIN D) 1000 units tablet COVID-19 mRNA Vac-TriS, Pfizer, (PFIZER-BIONT COVID-19 VAC-TRIS) SUSP injection dorzolamide-timolol (COSOPT) 22.3-6.8 MG/ML ophthalmic solution finasteride (PROSCAR) 5 MG tablet guaiFENesin (MUCINEX) 600 MG 12 hr tablet guaifenesin (ROBITUSSIN) 100 MG/5ML syrup latanoprost (XALATAN) 0.005 % ophthalmic solution   Results for orders placed or performed during the hospital encounter of 07/11/23 (from the past 48 hour(s))  Magnesium     Status: None   Collection Time: 07/11/23  4:50 PM  Result Value Ref Range   Magnesium 2.3 1.7 - 2.4 mg/dL    Comment: Performed at [facility], [address], [place], [place] [address]  Comprehensive metabolic panel     Status: Abnormal   Collection Time: 07/11/23  4:53 PM  Result Value Ref Range   Sodium 129 (L) 135 - 145 mmol/L   Potassium 4.5 3.5 - 5.1 mmol/L   Chloride 95 (L) 98 - 111 mmol/L   CO2 26 22 - 32 mmol/L   Glucose, Bld 155 (H) 70 - 99 mg/dL    Comment: Glucose reference range applies only to samples taken after fasting for at least 8 hours.   BUN 39 (H) 8 - 23 mg/dL   Creatinine, Ser 3.47 (H) 0.61 - 1.24 mg/dL  Calcium 10.8 (H) 8.9 - 10.3 mg/dL   Total Protein 7.8 6.5 - 8.1 g/dL   Albumin 3.9 3.5 - 5.0 g/dL   AST 17 15 - 41 U/L   ALT 15 0 - 44 U/L   Alkaline Phosphatase 73 38 - 126 U/L   Total Bilirubin 1.1 0.3 - 1.2 mg/dL   GFR, Estimated 40 (L) >60 mL/min    Comment: (NOTE) Calculated using the CKD-EPI Creatinine Equation (2021)    Anion gap 8 5 - 15    Comment: Performed at [facility], [address] [place], [place] [address]  CBC with Differential     Status: Abnormal   Collection Time: 07/11/23  4:53 PM  Result Value Ref Range   WBC 13.3 (H) 4.0 - 10.5 K/uL   RBC 5.54 4.22 - 5.81 MIL/uL   Hemoglobin 16.0 13.0 - 17.0 g/dL   HCT 72.5 36.6 - 44.0 %   MCV 87.5 80.0 - 100.0 fL   MCH 28.9  26.0 - 34.0 pg   MCHC 33.0 30.0 - 36.0 g/dL   RDW 34.7 42.5 - 95.6 %   Platelets 237 150 - 400 K/uL   nRBC 0.0 0.0 - 0.2 %   Neutrophils Relative % 82 %   Neutro Abs 11.0 (H) 1.7 - 7.7 K/uL   Lymphocytes Relative 7 %   Lymphs Abs 0.9 0.7 - 4.0 K/uL   Monocytes Relative 10 %   Monocytes Absolute 1.3 (H) 0.1 - 1.0 K/uL   Eosinophils Relative 0 %   Eosinophils Absolute 0.0 0.0 - 0.5 K/uL   Basophils Relative 0 %   Basophils Absolute 0.0 0.0 - 0.1 K/uL   Immature Granulocytes 1 %   Abs Immature Granulocytes 0.06 0.00 - 0.07 K/uL    Comment: Performed at [facility], [address], [place], [place] [address]  Troponin I (High Sensitivity)     Status: Abnormal   Collection Time: 07/11/23  4:53 PM  Result Value Ref Range   Troponin I (High Sensitivity) 39 (H) <18 ng/L    Comment: (NOTE) Elevated high sensitivity troponin I (hsTnI) values and significant  changes across serial measurements may suggest ACS but many other  chronic and acute conditions are known to elevate hsTnI results.  Refer to the "Links" section for chest pain algorithms and additional  guidance. Performed at [facility], [address], [place], [place] [address]   Brain natriuretic peptide     Status: Abnormal   Collection Time: 07/11/23  4:53 PM  Result Value Ref Range   B Natriuretic Peptide 232.3 (H) 0.0 - 100.0 pg/mL    Comment: Performed at [facility], [address] [place], [place] [address]  Lipase, blood     Status: None   Collection Time: 07/11/23  4:53 PM  Result Value Ref Range   Lipase 12 11 - 51 U/L    Comment: Performed at [facility], [address] [place], [place] [address]  Lactic acid, plasma     Status: Abnormal   Collection Time: 07/11/23  4:53 PM  Result Value Ref Range   Lactic Acid, Venous 3.0 (HH) 0.5 - 1.9 mmol/L    Comment: CRITICAL RESULT CALLED TO, READ BACK BY AND VERIFIED WITH: JERRI PEGRAM  RN,07/11/23 @ 1727 BY JYANG Performed at [facility], [address] [place], [place] [address]   Protime-INR     Status: None   Collection Time: 07/11/23  4:53 PM  Result Value Ref  Range   Prothrombin Time 15.2 11.4 - 15.2 seconds   INR 1.2 0.8 - 1.2    Comment: (NOTE) INR goal varies based on device and disease states. Performed at [facility], [address] [place], [place] [address]   I-Stat venous blood gas, ED     Status: Abnormal   Collection Time: 07/11/23  4:59 PM  Result Value Ref Range   pH, Ven 7.354 7.25 - 7.43   pCO2, Ven 68.4 (H) 44 - 60 mmHg   pO2, Ven 31 (LL) 32 - 45 mmHg   Bicarbonate 38.1 (H) 20.0 - 28.0 mmol/L   TCO2 40 (H) 22 - 32 mmol/L   O2 Saturation 54 %   Acid-Base Excess 9.0 (H) 0.0 - 2.0 mmol/L   Sodium 131 (L) 135 - 145 mmol/L   Potassium 4.7 3.5 - 5.1 mmol/L   Calcium, Ion 1.25 1.15 - 1.40 mmol/L   HCT 51.0 39.0 - 52.0 %   Hemoglobin 17.3 (H) 13.0 - 17.0 g/dL   Collection site IV start    Drawn by HIDE    Sample type VENOUS    Comment NOTIFIED PHYSICIAN   Lactic acid, plasma     Status: None   Collection Time: 07/11/23  7:33 PM  Result Value Ref Range   Lactic Acid, Venous 1.8 0.5 - 1.9 mmol/L    Comment: Performed at [facility], [address], [place], [place] [address]  Troponin I (High Sensitivity)     Status: Abnormal   Collection Time: 07/11/23  7:33 PM  Result Value Ref Range   Troponin I (High Sensitivity) 76 (H) <18 ng/L    Comment: (NOTE) Elevated high sensitivity troponin I (hsTnI) values and significant  changes across serial measurements may suggest ACS but many other  chronic and acute conditions are known to elevate hsTnI results.  Refer to the Links section for chest pain algorithms and additional  guidance. Performed at [facility], [address] [place], [place] [address]    DG Chest Port 1 View  Result Date: 07/11/2023 CLINICAL  DATA:  Tachycardia EXAM: PORTABLE CHEST 1 VIEW COMPARISON:  Chest x-ray 09/21/2020.  PET-CT 11/27/2022. FINDINGS: There some scattered interstitial opacities in both lungs which appear similar to prior study and are favored as chronic. There is no new focal lung consolidation, pleural effusion or pneumothorax. The cardiomediastinal silhouette is within normal limits no acute fractures are seen. IMPRESSION: Scattered interstitial opacities in both lungs appear similar to prior study and are favored as chronic. No new focal lung consolidation. Electronically Signed   By: Darliss Cheney M.D.   On: 07/11/2023 19:20   CT ABDOMEN PELVIS W CONTRAST  Addendum Date: 07/11/2023   ADDENDUM REPORT: 07/11/2023 19:11 ADDENDUM: These results were called by telephone at the time of interpretation on 07/11/2023 at 7:11 pm to provider [facility] , who verbally acknowledged these results. Electronically Signed   By: Darliss Cheney M.D.   On: 07/11/2023 19:11   Result Date: 07/11/2023 CLINICAL DATA:  Tachycardia and constipation. Evaluate for bowel obstruction. History of head and neck cancer. EXAM: CT ABDOMEN AND PELVIS WITH CONTRAST TECHNIQUE: Multidetector CT imaging of the abdomen and pelvis was performed using the standard protocol following bolus administration of intravenous contrast. RADIATION DOSE REDUCTION: This exam was performed according to the departmental dose-optimization program which includes automated exposure control, adjustment of the mA and/or kV according to patient size and/or use of iterative reconstruction technique. CONTRAST:  85mL OMNIPAQUE IOHEXOL 300  MG/ML  SOLN COMPARISON:  PET-CT 11/27/2022. FINDINGS: Lower chest: No acute abnormality. Hepatobiliary: No focal liver abnormality is seen. Status post cholecystectomy. No biliary dilatation. Pancreas: Unremarkable. No pancreatic ductal dilatation or surrounding inflammatory changes. Spleen: Normal in size without focal abnormality. Adrenals/Urinary  Tract: Adrenal glands are unremarkable. Kidneys are normal, without renal calculi, focal lesion, or hydronephrosis. Bladder is unremarkable. Stomach/Bowel: There is an umbilical hernia containing a dilated small bowel loop. This is transition point for small bowel obstruction. Small bowel loops proximal to this level are dilated with air-fluid levels measuring up to 4.2 cm. There is mild mesenteric edema. There is a moderate air-fluid level in the stomach as well. Small-bowel loops distal to this level are decompressed. The colon is nondilated. The appendix appears normal. There is no pneumatosis or free air. Vascular/Lymphatic: No significant vascular findings are present. No enlarged abdominal or pelvic lymph nodes. Reproductive: Prostate gland is enlarged. Other: There is a small amount of free fluid in the pelvis. Musculoskeletal: Degenerative changes affect the spine. Bilateral hip arthroplasties are present. IMPRESSION: Umbilical hernia containing a dilated small bowel loop causing small bowel obstruction. Findings are compatible with incarcerated/strangulated hernia. There is mild mesenteric edema and small amount of free fluid in the pelvis. No pneumatosis or free air. Electronically Signed: By: Darliss Cheney M.D. On: 07/11/2023 19:08    Review of Systems  Constitutional:  Positive for activity change and unexpected weight change.  Eyes: Negative.   Respiratory: Negative.    Cardiovascular: Negative.   Gastrointestinal:  Positive for constipation, nausea and vomiting.  Endocrine: Negative.   Genitourinary: Negative.   Musculoskeletal: Negative.   Allergic/Immunologic: Negative.   Neurological: Negative.   Hematological: Negative.   Psychiatric/Behavioral: Negative.    All other systems reviewed and are negative.   Blood pressure (!) 172/97, pulse (!) 51, temperature 98 F (36.7 C), temperature source Oral, resp. rate 20, height 6\' 1"  (1.854 m), weight 70.3 kg, SpO2 97%. Physical  Exam Vitals reviewed.  Constitutional:      General: He is not in acute distress.    Appearance: He is normal weight. He is not ill-appearing, toxic-appearing or diaphoretic.  HENT:     Head: Normocephalic and atraumatic.     Right Ear: External ear normal.     Left Ear: External ear normal.     Nose: Nose normal.     Mouth/Throat:     Mouth: Mucous membranes are dry.     Comments: Poor dentition Eyes:     General: No scleral icterus.       Right eye: No discharge.        Left eye: No discharge.     Extraocular Movements: Extraocular movements intact.     Conjunctiva/sclera: Conjunctivae normal.     Pupils: Pupils are equal, round, and reactive to light.  Cardiovascular:     Rate and Rhythm: Normal rate and regular rhythm.     Pulses: Normal pulses.  Pulmonary:     Effort: Pulmonary effort is normal.  Chest:     Chest wall: No tenderness.  Abdominal:     General: Abdomen is flat. There is no distension.     Palpations: Abdomen is soft.     Tenderness: There is no abdominal tenderness. There is no guarding or rebound.     Hernia: A hernia (umbilical) is present.  Musculoskeletal:        General: No swelling, tenderness, deformity or signs of injury.     Cervical back: Normal range  of motion and neck supple. No rigidity.  Lymphadenopathy:     Cervical: No cervical adenopathy.  Skin:    General: Skin is warm and dry.     Capillary Refill: Capillary refill takes 2 to 3 seconds.     Coloration: Skin is pale. Skin is not jaundiced.     Findings: No bruising, erythema, lesion or rash.  Neurological:     General: No focal deficit present.     Mental Status: He is alert and oriented to person, place, and time. Mental status is at baseline.     Cranial Nerves: No cranial nerve deficit.     Sensory: No sensory deficit.     Motor: No weakness.     Coordination: Coordination normal.  Psychiatric:        Mood and Affect: Mood normal.        Behavior: Behavior normal.         Thought Content: Thought content normal.        Judgment: Judgment normal.      Assessment/Plan Incarcerated umbilical hernia SBO  Repair umbilical hernia. Likely primary repair only.   Will post. Will defer to daytime team given patient's lack of discomfort and statement that hernia is improved over the last few days.    Discussed that bowel resection is possible and that mesh will likely not be used due to incarceration. Discussed risk or recurrent hernia or bowel injury/leak.    Will keep patient NPO.     Almond Lint, MD 07/11/2023, 10:31 PM

## 2023-07-11 NOTE — ED Provider Notes (Signed)
[facility] EMERGENCY DEPARTMENT AT [facility] Provider Note   CSN: [id number] Arrival date & time: 07/11/23  1623     History  Chief Complaint  Patient presents with   Tachycardia    Samuel Porter is a 82 y.o. male.  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  He said he has been constipated for the last 3 to 4 days and is having some lower abdominal discomfort.  Started with vomiting yesterday into today probably 7 times he said it was brown but he also said he has been trying to drink chocolate boost and MiraLAX.  Went to his PCP who found him to be tachycardic and recommended he come here for further evaluation.  He does not notice that tachycardia does not have any chest pain or shortness of breath.  Does not think he has had a fever.  He has a prior history of cholecystectomy and he has a known umbilical hernia that has not been symptomatic.  Not on any blood thinners.  The history is provided by the patient.  Abdominal Pain Pain location:  LLQ, RLQ and suprapubic Pain quality: cramping   Pain radiates to:  Does not radiate Pain severity:  Moderate Onset quality:  Gradual Duration:  2 days Timing:  Intermittent Progression:  Unchanged Chronicity:  New Relieved by:  None tried Worsened by:  Nothing Ineffective treatments:  None tried Associated symptoms: constipation, nausea and vomiting   Associated symptoms: no chest pain, no cough, no diarrhea, no dysuria, no fever, no hematemesis, no hematochezia, no hematuria and no shortness of breath        Home Medications Prior to Admission medications   Medication Sig Start Date End Date Taking? Authorizing Provider  amLODipine (NORVASC) 5 MG tablet Take 5 mg by mouth daily.    [provider]  aspirin EC 81 MG tablet Take 81 mg by mouth daily. Swallow whole.    [provider]  cholecalciferol (VITAMIN D) 1000 units tablet Take 1,000 Units by mouth  as directed. Twice weekly    [provider]  COVID-19 mRNA Vac-TriS, Pfizer, (PFIZER-BIONT COVID-19 VAC-TRIS) SUSP injection Inject into the muscle. 05/05/21   Judyann Munson, MD  dorzolamide-timolol (COSOPT) 22.3-6.8 MG/ML ophthalmic solution Place 1 drop into both eyes 2 (two) times daily. 07/09/16   [provider]  finasteride (PROSCAR) 5 MG tablet Take 5 mg by mouth daily.    [provider]  guaiFENesin (MUCINEX) 600 MG 12 hr tablet Take 600 mg by mouth 2 (two) times daily as needed for to loosen phlegm.    [provider]  guaifenesin (ROBITUSSIN) 100 MG/5ML syrup Take 200 mg by mouth 3 (three) times daily as needed for cough.    [provider]  latanoprost (XALATAN) 0.005 % ophthalmic solution Place 1 drop into both eyes at bedtime.    [provider]      Allergies    Patient has no known allergies.    Review of Systems   Review of Systems  Constitutional:  Negative for fever.  Respiratory:  Negative for cough and shortness of breath.   Cardiovascular:  Negative for chest pain.  Gastrointestinal:  Positive for abdominal distention, abdominal pain, constipation, nausea and vomiting. Negative for diarrhea, hematemesis and hematochezia.  Genitourinary:  Negative for dysuria and hematuria.    Physical Exam Updated Vital Signs BP (!) 142/86   Pulse (!) 132   Temp 98 F (36.7 C) (  Oral)   Resp 16   Ht 6\' 1"  (1.854 m)   Wt 70.3 kg   SpO2 97%   BMI 20.45 kg/m  Physical Exam Vitals and nursing note reviewed.  Constitutional:      General: He is not in acute distress.    Appearance: Normal appearance. He is well-developed.  HENT:     Head: Normocephalic and atraumatic.  Eyes:     Conjunctiva/sclera: Conjunctivae normal.  Cardiovascular:     Rate and Rhythm: Regular rhythm. Tachycardia present.     Heart sounds: No murmur heard. Pulmonary:     Effort: Pulmonary effort is normal. No respiratory distress.     Breath  sounds: Normal breath sounds.  Abdominal:     Palpations: Abdomen is soft.     Tenderness: There is abdominal tenderness. There is no guarding or rebound.     Hernia: A hernia (He has a hard umbilical hernia with a little bit of discoloration.  I was unable to reduce it at bedside.  He said it is mildly tender) is present.  Musculoskeletal:        General: No swelling.     Cervical back: Neck supple.  Skin:    General: Skin is warm and dry.     Capillary Refill: Capillary refill takes less than 2 seconds.  Neurological:     Mental Status: He is alert.  Psychiatric:        Mood and Affect: Mood normal.     ED Results / Procedures / Treatments   Labs (all labs ordered are listed, but only abnormal results are displayed) Labs Reviewed  COMPREHENSIVE METABOLIC PANEL - Abnormal; Notable for the following components:      Result Value   Sodium 129 (*)    Chloride 95 (*)    Glucose, Bld 155 (*)    BUN 39 (*)    Creatinine, Ser 1.68 (*)    Calcium 10.8 (*)    GFR, Estimated 40 (*)    All other components within normal limits  CBC WITH DIFFERENTIAL/PLATELET - Abnormal; Notable for the following components:   WBC 13.3 (*)    Neutro Abs 11.0 (*)    Monocytes Absolute 1.3 (*)    All other components within normal limits  BRAIN NATRIURETIC PEPTIDE - Abnormal; Notable for the following components:   B Natriuretic Peptide 232.3 (*)    All other components within normal limits  LACTIC ACID, PLASMA - Abnormal; Notable for the following components:   Lactic Acid, Venous 3.0 (*)    All other components within normal limits  I-STAT VENOUS BLOOD GAS, ED - Abnormal; Notable for the following components:   pCO2, Ven 68.4 (*)    pO2, Ven 31 (*)    Bicarbonate 38.1 (*)    TCO2 40 (*)    Acid-Base Excess 9.0 (*)    Sodium 131 (*)    Hemoglobin 17.3 (*)    All other components within normal limits  TROPONIN I (HIGH SENSITIVITY) - Abnormal; Notable for the following components:   Troponin I  (High Sensitivity) 39 (*)    All other components within normal limits  CULTURE, BLOOD (ROUTINE X 2)  CULTURE, BLOOD (ROUTINE X 2)  LIPASE, BLOOD  PROTIME-INR  MAGNESIUM  LACTIC ACID, PLASMA  URINALYSIS, ROUTINE W REFLEX MICROSCOPIC  TROPONIN I (HIGH SENSITIVITY)    EKG EKG Interpretation Date/Time:  Thursday July 11 2023 16:34:07 EDT Ventricular Rate:  134 PR Interval:  98 QRS Duration:  104 QT Interval:  252 QTC Calculation: 377 R Axis:   14  Text Interpretation: Sinus tachycardia vs afib Abnormal R-wave progression, early transition Repolarization abnormality, prob rate related new rapid rate and nonspecific ST,ts from prior 9/19 Confirmed by Meridee Score [phone]) on 07/11/2023 4:43:19 PM  Radiology CT ABDOMEN PELVIS W CONTRAST  Addendum Date: 07/11/2023   ADDENDUM REPORT: 07/11/2023 19:11 ADDENDUM: These results were called by telephone at the time of interpretation on 07/11/2023 at 7:11 pm to provider [facility] , who verbally acknowledged these results. Electronically Signed   By: Darliss Cheney M.D.   On: 07/11/2023 19:11   Result Date: 07/11/2023 CLINICAL DATA:  Tachycardia and constipation. Evaluate for bowel obstruction. History of head and neck cancer. EXAM: CT ABDOMEN AND PELVIS WITH CONTRAST TECHNIQUE: Multidetector CT imaging of the abdomen and pelvis was performed using the standard protocol following bolus administration of intravenous contrast. RADIATION DOSE REDUCTION: This exam was performed according to the departmental dose-optimization program which includes automated exposure control, adjustment of the mA and/or kV according to patient size and/or use of iterative reconstruction technique. CONTRAST:  85mL OMNIPAQUE IOHEXOL 300 MG/ML  SOLN COMPARISON:  PET-CT 11/27/2022. FINDINGS: Lower chest: No acute abnormality. Hepatobiliary: No focal liver abnormality is seen. Status post cholecystectomy. No biliary dilatation. Pancreas: Unremarkable. No pancreatic  ductal dilatation or surrounding inflammatory changes. Spleen: Normal in size without focal abnormality. Adrenals/Urinary Tract: Adrenal glands are unremarkable. Kidneys are normal, without renal calculi, focal lesion, or hydronephrosis. Bladder is unremarkable. Stomach/Bowel: There is an umbilical hernia containing a dilated small bowel loop. This is transition point for small bowel obstruction. Small bowel loops proximal to this level are dilated with air-fluid levels measuring up to 4.2 cm. There is mild mesenteric edema. There is a moderate air-fluid level in the stomach as well. Small-bowel loops distal to this level are decompressed. The colon is nondilated. The appendix appears normal. There is no pneumatosis or free air. Vascular/Lymphatic: No significant vascular findings are present. No enlarged abdominal or pelvic lymph nodes. Reproductive: Prostate gland is enlarged. Other: There is a small amount of free fluid in the pelvis. Musculoskeletal: Degenerative changes affect the spine. Bilateral hip arthroplasties are present. IMPRESSION: Umbilical hernia containing a dilated small bowel loop causing small bowel obstruction. Findings are compatible with incarcerated/strangulated hernia. There is mild mesenteric edema and small amount of free fluid in the pelvis. No pneumatosis or free air. Electronically Signed: By: Darliss Cheney M.D. On: 07/11/2023 19:08    Procedures .Critical Care  Performed by: Terrilee Files, MD Authorized by: Terrilee Files, MD   Critical care provider statement:    Critical care time (minutes):  45   Critical care time was exclusive of:  Separately billable procedures and treating other patients   Critical care was necessary to treat or prevent imminent or life-threatening deterioration of the following conditions:  Sepsis   Critical care was time spent personally by me on the following activities:  Development of treatment plan with patient or surrogate, discussions  with consultants, evaluation of patient's response to treatment, examination of patient, obtaining history from patient or surrogate, ordering and performing treatments and interventions, ordering and review of laboratory studies, ordering and review of radiographic studies, pulse oximetry and re-evaluation of patient's condition   I assumed direction of critical care for this patient from another provider in my specialty: no   Hernia reduction  Date/Time: 07/12/2023 9:37 AM  Performed by: Terrilee Files, MD Authorized by: Terrilee Files, MD  Consent: Verbal  consent obtained. Consent given by: patient Patient understanding: patient states understanding of the procedure being performed Patient consent: the patient's understanding of the procedure matches consent given Patient identity confirmed: verbally with patient Local anesthesia used: no  Anesthesia: Local anesthesia used: no  Sedation: Patient sedated: no  Patient tolerance: patient tolerated the procedure well with no immediate complications Comments: Unsuccessful attempt at reduction x 2 at bedside       Medications Ordered in ED Medications  ondansetron (ZOFRAN) injection 4 mg (4 mg Intravenous Patient Refused/Not Given 07/11/23 1708)  0.9 %  sodium chloride infusion (has no administration in time range)  sodium chloride 0.9 % bolus 1,000 mL (0 mLs Intravenous Stopped 07/11/23 1746)  fentaNYL (SUBLIMAZE) injection 50 mcg (50 mcg Intravenous Given 07/11/23 1838)  piperacillin-tazobactam (ZOSYN) IVPB 3.375 g (0 g Intravenous Stopped 07/11/23 1824)  iohexol (OMNIPAQUE) 300 MG/ML solution 100 mL (85 mLs Intravenous Contrast Given 07/11/23 1806)    ED Course/ Medical Decision Making/ A&P Clinical Course as of 07/11/23 1916  Thu Jul 11, 2023  1659 Patient had IV placed his heart rate dropped in the monitor back to 90s and now he has P waves with some ectopy.  Will repeat an EKG. [MB]  1708 Chest x-ray does not show any  clear infiltrate though has some possible reticulonodular findings in the right side.  Awaiting radiology reading. [MB]  1847 I attempted to reduce patient's hernia on arrival when I initially examined him.  After his return from CT it looks like he has an incarcerated hernia so we put him in reverse Trendelenburg and ice, fentanyl for pain and attempted reduction again.  I was unable to successfully reduce it.  Placing call to general surgery for recommendations. [MB]  1859 Discussed with general surgery Dr. Donell Beers who is excepting the patient and transferred to [facility] ED for further evaluation.  Discussed with ED physician Dr. Dalene Seltzer.  I also reached out to radiology to see if they can move his CAT scan up the list to be read as soon as possible.  Secretary is working on Nurse, mental health. [MB]  [id number] Received a call from radiology that patient does have an incarcerated umbilical hernia with bowel obstruction. [MB]    Clinical Course User Index [MB] Terrilee Files, MD                                 Medical Decision Making Amount and/or Complexity of Data Reviewed Labs: ordered. Radiology: ordered.  Risk Prescription drug management. Decision regarding hospitalization.   This patient complains of abdominal pain nausea vomiting elevated heart rate; this involves an extensive number of treatment Options and is a complaint that carries with it a high risk of complications and morbidity. The differential includes arrhythmia, A-fib, ischemia, mesenteric ischemia, obstruction, incarcerated hernia, perforation, diverticulitis, colitis, constipation  I ordered, reviewed and interpreted labs, which included CBC with elevated white count, chemistries with low sodium elevated BUN and creatinine, lactate elevated cleared with fluids, VBG with out acidosis, blood culture sent, troponins elevated and rising a little bit I ordered medication IV pain medicine fluids antibiotics and  reviewed PMP when indicated. I ordered imaging studies which included chest x-ray and CT abdomen and pelvis and I independently    visualized and interpreted imaging which showed acute bowel obstruction with transition point at incarcerated umbilical hernia Additional history obtained from patient's daughter Previous records obtained and reviewed in epic  including PCP visit today I consulted general surgery Dr. Donell Beers and discussed lab and imaging findings and discussed disposition.  Cardiac monitoring reviewed, tacky narrow complex rhythm possibly A-fib, broke to sinus rhythm without intervention Social determinants considered, no significant barriers Critical Interventions: Initiation of fluids and antibiotics for patient's possible SIRS sepsis  After the interventions stated above, I reevaluated the patient and found patient still to be uncomfortable although not vomiting Admission and further testing considered, patient will be transferred over to [facility] ED for evaluation by general surgery and further management.  He is in agreement with plan for transfer.         Final Clinical Impression(s) / ED Diagnoses Final diagnoses:  Incarcerated umbilical hernia  Elevated lactic acid level  Narrow complex tachycardia    Rx / DC Orders ED Discharge Orders     None         Terrilee Files, MD 07/12/23 [phone]

## 2023-07-11 NOTE — ED Notes (Addendum)
Only 1 blood culture was obtained... No other easy access on the right arm... Provider notified.Marland KitchenMarland Kitchen

## 2023-07-12 ENCOUNTER — Other Ambulatory Visit: Payer: Self-pay

## 2023-07-12 ENCOUNTER — Inpatient Hospital Stay (HOSPITAL_COMMUNITY): Payer: Medicare HMO

## 2023-07-12 ENCOUNTER — Encounter (HOSPITAL_COMMUNITY): Payer: Self-pay

## 2023-07-12 ENCOUNTER — Encounter (HOSPITAL_COMMUNITY): Admission: EM | Disposition: A | Discharge status: Home / Self Care | Payer: Self-pay | Source: Home / Self Care

## 2023-07-12 DIAGNOSIS — K439 Ventral hernia without obstruction or gangrene: Secondary | ICD-10-CM

## 2023-07-12 HISTORY — PX: VENTRAL HERNIA REPAIR: SHX424

## 2023-07-12 LAB — CBC
HCT: 45.6 % (ref 39.0–52.0)
Hemoglobin: 14.9 g/dL (ref 13.0–17.0)
MCH: 28.3 pg (ref 26.0–34.0)
MCHC: 32.7 g/dL (ref 30.0–36.0)
MCV: 86.7 fL (ref 80.0–100.0)
Platelets: 212 10*3/uL (ref 150–400)
RBC: 5.26 MIL/uL (ref 4.22–5.81)
RDW: 13.7 % (ref 11.5–15.5)
WBC: 10.5 10*3/uL (ref 4.0–10.5)
nRBC: 0 % (ref 0.0–0.2)

## 2023-07-12 LAB — GLUCOSE, CAPILLARY: Glucose-Capillary: 147 mg/dL — ABNORMAL HIGH (ref 70–99)

## 2023-07-12 LAB — BASIC METABOLIC PANEL
Anion gap: 7 (ref 5–15)
BUN: 35 mg/dL — ABNORMAL HIGH (ref 8–23)
CO2: 22 mmol/L (ref 22–32)
Calcium: 9.7 mg/dL (ref 8.9–10.3)
Chloride: 101 mmol/L (ref 98–111)
Creatinine, Ser: 1.29 mg/dL — ABNORMAL HIGH (ref 0.61–1.24)
GFR, Estimated: 55 mL/min — ABNORMAL LOW (ref >60)
Glucose, Bld: 136 mg/dL — ABNORMAL HIGH (ref 70–99)
Potassium: 4.9 mmol/L (ref 3.5–5.1)
Sodium: 130 mmol/L — ABNORMAL LOW (ref 135–145)

## 2023-07-12 LAB — TYPE AND SCREEN
ABO/RH(D): A POS
Antibody Screen: NEGATIVE

## 2023-07-12 SURGERY — REPAIR, HERNIA, VENTRAL
Anesthesia: General

## 2023-07-12 MED ORDER — METOPROLOL TARTRATE 5 MG/5ML IV SOLN: 5.0000 mg | Freq: Four times a day (QID) | INTRAVENOUS | Status: DC | PRN

## 2023-07-12 MED ORDER — DIPHENHYDRAMINE HCL 12.5 MG/5ML PO ELIX: 12.5000 mg | ORAL_SOLUTION | Freq: Four times a day (QID) | ORAL | Status: DC | PRN

## 2023-07-12 MED ORDER — PROCHLORPERAZINE EDISYLATE 10 MG/2ML IJ SOLN
5.0000 mg | Freq: Four times a day (QID) | INTRAMUSCULAR | Status: DC | PRN
  Administered 2023-07-12: 10 mg via INTRAVENOUS
  Filled 2023-07-12: qty 2

## 2023-07-12 MED ORDER — MORPHINE SULFATE (PF) 2 MG/ML IV SOLN: 1.0000 mg | INTRAVENOUS | Status: DC | PRN

## 2023-07-12 MED ORDER — LIDOCAINE 2% (20 MG/ML) 5 ML SYRINGE
INTRAMUSCULAR | Status: AC
  Filled 2023-07-12: qty 15

## 2023-07-12 MED ORDER — ENOXAPARIN SODIUM 40 MG/0.4ML IJ SOSY
40.0000 mg | PREFILLED_SYRINGE | INTRAMUSCULAR | Status: DC
  Administered 2023-07-13 – 2023-07-15 (×3): 40 mg via SUBCUTANEOUS
  Filled 2023-07-12 (×3): qty 0.4

## 2023-07-12 MED ORDER — BUPIVACAINE-EPINEPHRINE 0.25% -1:200000 IJ SOLN
INTRAMUSCULAR | Status: DC | PRN
  Administered 2023-07-12: 30 mL

## 2023-07-12 MED ORDER — ROCURONIUM BROMIDE 10 MG/ML (PF) SYRINGE
PREFILLED_SYRINGE | INTRAVENOUS | Status: AC
  Filled 2023-07-12: qty 30

## 2023-07-12 MED ORDER — PROCHLORPERAZINE MALEATE 10 MG PO TABS: 10.0000 mg | ORAL_TABLET | Freq: Four times a day (QID) | ORAL | Status: DC | PRN

## 2023-07-12 MED ORDER — VITAMIN D 25 MCG (1000 UNIT) PO TABS
1000.0000 [IU] | ORAL_TABLET | ORAL | Status: DC
  Administered 2023-07-15: 1000 [IU] via ORAL
  Filled 2023-07-12: qty 1

## 2023-07-12 MED ORDER — FINASTERIDE 5 MG PO TABS
5.0000 mg | ORAL_TABLET | Freq: Every day | ORAL | Status: DC
  Administered 2023-07-13 – 2023-07-16 (×4): 5 mg via ORAL
  Filled 2023-07-12 (×5): qty 1

## 2023-07-12 MED ORDER — LATANOPROST 0.005 % OP SOLN
1.0000 [drp] | Freq: Every day | OPHTHALMIC | Status: DC
  Administered 2023-07-12 – 2023-07-15 (×4): 1 [drp] via OPHTHALMIC
  Filled 2023-07-12: qty 2.5

## 2023-07-12 MED ORDER — FENTANYL CITRATE (PF) 250 MCG/5ML IJ SOLN
INTRAMUSCULAR | Status: AC
  Filled 2023-07-12: qty 5

## 2023-07-12 MED ORDER — ONDANSETRON HCL 4 MG/2ML IJ SOLN: 4.0000 mg | Freq: Once | INTRAMUSCULAR | Status: DC | PRN

## 2023-07-12 MED ORDER — MELATONIN 3 MG PO TABS
3.0000 mg | ORAL_TABLET | Freq: Every evening | ORAL | Status: DC | PRN
  Administered 2023-07-12 (×2): 3 mg via ORAL
  Filled 2023-07-12 (×2): qty 1

## 2023-07-12 MED ORDER — DEXAMETHASONE SODIUM PHOSPHATE 10 MG/ML IJ SOLN
INTRAMUSCULAR | Status: AC
  Filled 2023-07-12: qty 3

## 2023-07-12 MED ORDER — POLYETHYLENE GLYCOL 3350 17 G PO PACK
17.0000 g | PACK | Freq: Every day | ORAL | Status: DC
  Administered 2023-07-13 – 2023-07-14 (×2): 17 g via ORAL
  Filled 2023-07-12 (×2): qty 1

## 2023-07-12 MED ORDER — LACTATED RINGERS IV SOLN: INTRAVENOUS | Status: DC

## 2023-07-12 MED ORDER — CEFAZOLIN SODIUM-DEXTROSE 2-3 GM-%(50ML) IV SOLR
INTRAVENOUS | Status: DC | PRN
Start: 2023-07-12 — End: 2023-07-12
  Administered 2023-07-12: 2 g via INTRAVENOUS

## 2023-07-12 MED ORDER — DEXAMETHASONE SODIUM PHOSPHATE 10 MG/ML IJ SOLN
INTRAMUSCULAR | Status: AC
  Filled 2023-07-12: qty 1

## 2023-07-12 MED ORDER — CHLORHEXIDINE GLUCONATE 0.12 % MT SOLN
OROMUCOSAL | Status: AC
  Administered 2023-07-12: 15 mL via OROMUCOSAL
  Filled 2023-07-12: qty 15

## 2023-07-12 MED ORDER — FENTANYL CITRATE (PF) 100 MCG/2ML IJ SOLN
25.0000 ug | INTRAMUSCULAR | Status: DC | PRN
  Administered 2023-07-12: 50 ug via INTRAVENOUS

## 2023-07-12 MED ORDER — LIDOCAINE 2% (20 MG/ML) 5 ML SYRINGE
INTRAMUSCULAR | Status: DC | PRN
  Administered 2023-07-12: 80 mg via INTRAVENOUS

## 2023-07-12 MED ORDER — CHLORHEXIDINE GLUCONATE 0.12 % MT SOLN: 15.0000 mL | Freq: Once | OROMUCOSAL | Status: AC

## 2023-07-12 MED ORDER — PROPOFOL 10 MG/ML IV BOLUS
INTRAVENOUS | Status: AC
  Filled 2023-07-12: qty 20

## 2023-07-12 MED ORDER — METHOCARBAMOL 1000 MG/10ML IJ SOLN: 500.0000 mg | Freq: Three times a day (TID) | INTRAVENOUS | Status: DC | PRN

## 2023-07-12 MED ORDER — SENNA 8.6 MG PO TABS
1.0000 | ORAL_TABLET | Freq: Two times a day (BID) | ORAL | Status: DC
  Administered 2023-07-12 – 2023-07-16 (×9): 8.6 mg via ORAL
  Filled 2023-07-12 (×10): qty 1

## 2023-07-12 MED ORDER — ASPIRIN 81 MG PO TBEC
81.0000 mg | DELAYED_RELEASE_TABLET | Freq: Every day | ORAL | Status: DC
  Administered 2023-07-13 – 2023-07-16 (×4): 81 mg via ORAL
  Filled 2023-07-12 (×5): qty 1

## 2023-07-12 MED ORDER — ONDANSETRON HCL 4 MG/2ML IJ SOLN
INTRAMUSCULAR | Status: DC | PRN
  Administered 2023-07-12: 4 mg via INTRAVENOUS

## 2023-07-12 MED ORDER — DEXAMETHASONE SODIUM PHOSPHATE 10 MG/ML IJ SOLN
INTRAMUSCULAR | Status: DC | PRN
  Administered 2023-07-12: 4 mg via INTRAVENOUS

## 2023-07-12 MED ORDER — BUPIVACAINE-EPINEPHRINE (PF) 0.25% -1:200000 IJ SOLN
INTRAMUSCULAR | Status: AC
  Filled 2023-07-12: qty 30

## 2023-07-12 MED ORDER — ROCURONIUM BROMIDE 10 MG/ML (PF) SYRINGE
PREFILLED_SYRINGE | INTRAVENOUS | Status: DC | PRN
  Administered 2023-07-12: 50 mg via INTRAVENOUS

## 2023-07-12 MED ORDER — ONDANSETRON HCL 4 MG/2ML IJ SOLN
INTRAMUSCULAR | Status: AC
  Filled 2023-07-12: qty 2

## 2023-07-12 MED ORDER — ORAL CARE MOUTH RINSE: 15.0000 mL | Freq: Once | OROMUCOSAL | Status: AC

## 2023-07-12 MED ORDER — ONDANSETRON HCL 4 MG/2ML IJ SOLN
4.0000 mg | Freq: Four times a day (QID) | INTRAMUSCULAR | Status: DC | PRN
  Administered 2023-07-12 (×2): 4 mg via INTRAVENOUS
  Filled 2023-07-12 (×2): qty 2

## 2023-07-12 MED ORDER — GUAIFENESIN ER 600 MG PO TB12: 600.0000 mg | ORAL_TABLET | Freq: Two times a day (BID) | ORAL | Status: DC | PRN

## 2023-07-12 MED ORDER — SUGAMMADEX SODIUM 200 MG/2ML IV SOLN
INTRAVENOUS | Status: DC | PRN
  Administered 2023-07-12: 200 mg via INTRAVENOUS

## 2023-07-12 MED ORDER — PROPOFOL 10 MG/ML IV BOLUS
INTRAVENOUS | Status: DC | PRN
  Administered 2023-07-12: 100 mg via INTRAVENOUS

## 2023-07-12 MED ORDER — PHENYLEPHRINE 80 MCG/ML (10ML) SYRINGE FOR IV PUSH (FOR BLOOD PRESSURE SUPPORT)
PREFILLED_SYRINGE | INTRAVENOUS | Status: AC
  Filled 2023-07-12: qty 10

## 2023-07-12 MED ORDER — PANTOPRAZOLE SODIUM 40 MG IV SOLR
40.0000 mg | Freq: Every day | INTRAVENOUS | Status: DC
  Administered 2023-07-12 – 2023-07-13 (×3): 40 mg via INTRAVENOUS
  Filled 2023-07-12 (×3): qty 10

## 2023-07-12 MED ORDER — DORZOLAMIDE HCL-TIMOLOL MAL 2-0.5 % OP SOLN
1.0000 [drp] | Freq: Two times a day (BID) | OPHTHALMIC | Status: DC
  Administered 2023-07-12 – 2023-07-16 (×9): 1 [drp] via OPHTHALMIC
  Filled 2023-07-12: qty 10

## 2023-07-12 MED ORDER — METHOCARBAMOL 500 MG PO TABS
500.0000 mg | ORAL_TABLET | Freq: Three times a day (TID) | ORAL | Status: DC | PRN
  Administered 2023-07-12 (×2): 500 mg via ORAL
  Filled 2023-07-12 (×2): qty 1

## 2023-07-12 MED ORDER — KCL IN DEXTROSE-NACL 20-5-0.45 MEQ/L-%-% IV SOLN
INTRAVENOUS | Status: DC
  Filled 2023-07-12 (×8): qty 1000

## 2023-07-12 MED ORDER — EPHEDRINE 5 MG/ML INJ
INTRAVENOUS | Status: AC
  Filled 2023-07-12: qty 5

## 2023-07-12 MED ORDER — DIPHENHYDRAMINE HCL 50 MG/ML IJ SOLN: 12.5000 mg | Freq: Four times a day (QID) | INTRAMUSCULAR | Status: DC | PRN

## 2023-07-12 MED ORDER — ONDANSETRON 4 MG PO TBDP: 4.0000 mg | ORAL_TABLET | Freq: Four times a day (QID) | ORAL | Status: DC | PRN

## 2023-07-12 MED ORDER — ACETAMINOPHEN 500 MG PO TABS
1000.0000 mg | ORAL_TABLET | Freq: Four times a day (QID) | ORAL | Status: DC
  Administered 2023-07-12 – 2023-07-15 (×12): 1000 mg via ORAL
  Filled 2023-07-12 (×16): qty 2

## 2023-07-12 MED ORDER — FENTANYL CITRATE (PF) 100 MCG/2ML IJ SOLN
INTRAMUSCULAR | Status: AC
  Filled 2023-07-12: qty 2

## 2023-07-12 MED ORDER — FENTANYL CITRATE (PF) 250 MCG/5ML IJ SOLN
INTRAMUSCULAR | Status: DC | PRN
  Administered 2023-07-12: 25 ug via INTRAVENOUS
  Administered 2023-07-12: 75 ug via INTRAVENOUS

## 2023-07-12 MED ORDER — ONDANSETRON HCL 4 MG/2ML IJ SOLN
INTRAMUSCULAR | Status: AC
  Filled 2023-07-12: qty 6

## 2023-07-12 MED ORDER — SIMETHICONE 80 MG PO CHEW: 40.0000 mg | CHEWABLE_TABLET | Freq: Four times a day (QID) | ORAL | Status: DC | PRN

## 2023-07-12 MED ORDER — AMLODIPINE BESYLATE 5 MG PO TABS
5.0000 mg | ORAL_TABLET | Freq: Every day | ORAL | Status: DC
  Administered 2023-07-13 – 2023-07-16 (×4): 5 mg via ORAL
  Filled 2023-07-12 (×5): qty 1

## 2023-07-12 MED ORDER — OXYCODONE HCL 5 MG PO TABS: 5.0000 mg | ORAL_TABLET | ORAL | Status: DC | PRN

## 2023-07-12 SURGICAL SUPPLY — 41 items
ADH SKN CLS APL DERMABOND .7 (GAUZE/BANDAGES/DRESSINGS)
APL PRP STRL LF DISP 70% ISPRP (MISCELLANEOUS) ×1
BAG COUNTER SPONGE SURGICOUNT (BAG) ×1 IMPLANT
BAG SPNG CNTER NS LX DISP (BAG) ×1
BLADE CLIPPER SURG (BLADE) IMPLANT
CANISTER SUCT 3000ML PPV (MISCELLANEOUS) ×1 IMPLANT
CHLORAPREP W/TINT 26 (MISCELLANEOUS) ×1 IMPLANT
COVER SURGICAL LIGHT HANDLE (MISCELLANEOUS) ×1 IMPLANT
DERMABOND ADVANCED .7 DNX12 (GAUZE/BANDAGES/DRESSINGS) IMPLANT
DRAPE LAPAROSCOPIC ABDOMINAL (DRAPES) ×1 IMPLANT
ELECT CAUTERY BLADE 6.4 (BLADE) ×1 IMPLANT
ELECT REM PT RETURN 9FT ADLT (ELECTROSURGICAL) ×1
ELECTRODE REM PT RTRN 9FT ADLT (ELECTROSURGICAL) ×1 IMPLANT
GAUZE 4X4 16PLY ~~LOC~~+RFID DBL (SPONGE) IMPLANT
GAUZE SPONGE 4X4 12PLY STRL (GAUZE/BANDAGES/DRESSINGS) IMPLANT
GLOVE BIOGEL M STRL SZ7.5 (GLOVE) ×1 IMPLANT
GLOVE INDICATOR 8.0 STRL GRN (GLOVE) ×2 IMPLANT
GLOVE SRG 8 PF TXTR STRL LF DI (GLOVE) ×1 IMPLANT
GLOVE SURG ENC MOIS LTX SZ7.5 (GLOVE) ×1 IMPLANT
GLOVE SURG UNDER POLY LF SZ8 (GLOVE) ×1
GOWN STRL REUS W/ TWL LRG LVL3 (GOWN DISPOSABLE) ×2 IMPLANT
GOWN STRL REUS W/TWL 2XL LVL3 (GOWN DISPOSABLE) ×1 IMPLANT
GOWN STRL REUS W/TWL LRG LVL3 (GOWN DISPOSABLE) ×2
KIT BASIN OR (CUSTOM PROCEDURE TRAY) ×1 IMPLANT
KIT TURNOVER KIT B (KITS) ×1 IMPLANT
NDL 22X1.5 STRL (OR ONLY) (MISCELLANEOUS) ×1 IMPLANT
NEEDLE 22X1.5 STRL (OR ONLY) (MISCELLANEOUS) ×1 IMPLANT
NS IRRIG 1000ML POUR BTL (IV SOLUTION) ×1 IMPLANT
PACK GENERAL/GYN (CUSTOM PROCEDURE TRAY) ×1 IMPLANT
PAD ARMBOARD 7.5X6 YLW CONV (MISCELLANEOUS) ×1 IMPLANT
PENCIL SMOKE EVACUATOR (MISCELLANEOUS) ×1 IMPLANT
STAPLER VISISTAT 35W (STAPLE) IMPLANT
SUT ETHILON 2 0 PS N (SUTURE) IMPLANT
SUT MNCRL AB 4-0 PS2 18 (SUTURE) ×1 IMPLANT
SUT PDS AB 2-0 CT2 27 (SUTURE) ×4 IMPLANT
SUT STRATAFIX 1PDS 45CM VIOLET (SUTURE) IMPLANT
SUT VIC AB 3-0 SH 8-18 (SUTURE) ×1 IMPLANT
SYR CONTROL 10ML LL (SYRINGE) ×1 IMPLANT
TOWEL GREEN STERILE (TOWEL DISPOSABLE) ×1 IMPLANT
TOWEL GREEN STERILE FF (TOWEL DISPOSABLE) ×1 IMPLANT
TRAY FOLEY MTR SLVR 14FR STAT (SET/KITS/TRAYS/PACK) IMPLANT

## 2023-07-12 NOTE — Anesthesia Preprocedure Evaluation (Addendum)
Anesthesia Evaluation  Patient identified by MRN, date of birth, ID band Patient awake    Reviewed: Allergy & Precautions, NPO status , Patient's Chart, lab work & pertinent test results  Airway Mallampati: II  TM Distance: >3 FB Neck ROM: Full    Dental  (+) Teeth Intact, Dental Advisory Given, Caps,    Pulmonary former smoker   Pulmonary exam normal breath sounds clear to auscultation       Cardiovascular hypertension, Pt. on medications Normal cardiovascular exam Rhythm:Regular Rate:Normal     Neuro/Psych negative neurological ROS     GI/Hepatic Neg liver ROS,,,ventral hernia    Endo/Other  negative endocrine ROS    Renal/GU negative Renal ROS     Musculoskeletal  (+) Arthritis ,    Abdominal   Peds  Hematology negative hematology ROS (+)   Anesthesia Other Findings Day of surgery medications reviewed with the patient.  Reproductive/Obstetrics                             Anesthesia Physical Anesthesia Plan  ASA: 3  Anesthesia Plan: General   Post-op Pain Management: Tylenol PO (pre-op)*   Induction: Intravenous  PONV Risk Score and Plan: 3 and Dexamethasone and Ondansetron  Airway Management Planned: Oral ETT  Additional Equipment:   Intra-op Plan:   Post-operative Plan: Extubation in OR  Informed Consent: I have reviewed the patients History and Physical, chart, labs and discussed the procedure including the risks, benefits and alternatives for the proposed anesthesia with the patient or authorized representative who has indicated his/her understanding and acceptance.     Dental advisory given  Plan Discussed with: CRNA  Anesthesia Plan Comments:        Anesthesia Quick Evaluation

## 2023-07-12 NOTE — Plan of Care (Signed)

## 2023-07-12 NOTE — Discharge Instructions (Signed)
[facility] _______[facility]  UMBILICAL OR INGUINAL HERNIA REPAIR: POST OP INSTRUCTIONS  Always review your discharge instruction sheet given to you by the facility where your surgery was performed. IF YOU HAVE DISABILITY OR FAMILY LEAVE FORMS, YOU MUST BRING THEM TO THE OFFICE FOR PROCESSING.   DO NOT GIVE THEM TO YOUR DOCTOR.  1. A  prescription for pain medication may be given to you upon discharge.  Take your pain medication as prescribed, if needed.  If narcotic pain medicine is not needed, then you may take acetaminophen (Tylenol) or ibuprofen (Advil) as needed. 2. Take your usually prescribed medications unless otherwise directed. If you need a refill on your pain medication, please contact your pharmacy.  They will contact our office to request authorization. Prescriptions will not be filled after 5 pm or on week-ends. 3. You should follow a light diet the first 24 hours after arrival home, such as soup and crackers, etc.  Be sure to include lots of fluids daily.  Resume your normal diet the day after surgery. 4.Most patients will experience some swelling and bruising around the umbilicus or in the groin and scrotum.  Ice packs and reclining will help.  Swelling and bruising can take several days to resolve.  6. It is common to experience some constipation if taking pain medication after surgery.  Increasing fluid intake and taking a stool softener (such as Colace) will usually help or prevent this problem from occurring.  A mild laxative (Milk of Magnesia or Miralax) should be taken according to package directions if there are no bowel movements after 48 hours. 7. Unless discharge instructions indicate otherwise, you may remove your bandages 24-48 hours after surgery, and you may shower at that time.  You may have steri-strips (small skin tapes) in place directly over the incision.  These strips should be left on the skin for 7-10 days.  If your surgeon used skin glue on the  incision, you may shower in 24 hours.  The glue will flake off over the next 2-3 weeks.  Any sutures or staples will be removed at the office during your follow-up visit. 8. ACTIVITIES:  You may resume regular (light) daily activities beginning the next day--such as daily self-care, walking, climbing stairs--gradually increasing activities as tolerated.  You may have sexual intercourse when it is comfortable.  Refrain from any heavy lifting or straining until approved by your doctor.  a.You may drive when you are no longer taking prescription pain medication, you can comfortably wear a seatbelt, and you can safely maneuver your car and apply brakes. b.RETURN TO WORK:   _____________________________________________  9.You should see your doctor in the office for a follow-up appointment approximately 2-3 weeks after your surgery.  Make sure that you call for this appointment within a day or two after you arrive home to insure a convenient appointment time. 10.OTHER INSTRUCTIONS: _________________________    _____________________________________  WHEN TO CALL YOUR DOCTOR: Fever over 101.0 Inability to urinate Nausea and/or vomiting Extreme swelling or bruising Continued bleeding from incision. Increased pain, redness, or drainage from the incision  The clinic staff is available to answer your questions during regular business hours.  Please don't hesitate to call and ask to speak to one of the nurses for clinical concerns.  If you have a medical emergency, go to the nearest emergency room or call 911.  A surgeon from [facility] is always on call at the hospital   [address], [address],   [place], [place]  [address] ?  [address] [address], [place], [place]   [address] [phone] ? [phone] ? FAX [phone] Web site: [email or web address]  

## 2023-07-12 NOTE — Anesthesia Procedure Notes (Signed)
Procedure Name: Intubation Date/Time: 07/12/2023 1:43 PM  Performed by: Sandie Ano, CRNAPre-anesthesia Checklist: Patient identified, Emergency Drugs available, Suction available and Patient being monitored Patient Re-evaluated:Patient Re-evaluated prior to induction Oxygen Delivery Method: Circle System Utilized Preoxygenation: Pre-oxygenation with 100% oxygen Induction Type: IV induction Ventilation: Mask ventilation without difficulty Laryngoscope Size: Mac and 3 Grade View: Grade II Tube type: Oral Tube size: 7.0 mm Number of attempts: 1 Airway Equipment and Method: Stylet and Oral airway Placement Confirmation: ETT inserted through vocal cords under direct vision, positive ETCO2 and breath sounds checked- equal and bilateral Secured at: 22 cm Tube secured with: Tape Dental Injury: Teeth and Oropharynx as per pre-operative assessment

## 2023-07-12 NOTE — Anesthesia Postprocedure Evaluation (Signed)
Anesthesia Post Note  Patient: Samuel Porter  Procedure(s) Performed: HERNIA REPAIR VENTRAL     Patient location during evaluation: PACU Anesthesia Type: General Level of consciousness: awake and alert Pain management: pain level controlled Vital Signs Assessment: post-procedure vital signs reviewed and stable Respiratory status: spontaneous breathing, nonlabored ventilation, respiratory function stable and patient connected to nasal cannula oxygen Cardiovascular status: blood pressure returned to baseline and stable Postop Assessment: no apparent nausea or vomiting Anesthetic complications: no   No notable events documented.  Last Vitals:  Vitals:   07/12/23 1445 07/12/23 1511  BP: 128/72 127/70  Pulse: 75 73  Resp: 14 16  Temp: (!) 36.2 C 36.4 C  SpO2: 94% 94%    Last Pain:  Vitals:   07/12/23 1511  TempSrc: Oral  PainSc:                  Collene Schlichter

## 2023-07-12 NOTE — Transfer of Care (Signed)
Immediate Anesthesia Transfer of Care Note  Patient: Samuel Porter  Procedure(s) Performed: HERNIA REPAIR VENTRAL  Patient Location: PACU  Anesthesia Type:General  Level of Consciousness: awake  Airway & Oxygen Therapy: Patient Spontanous Breathing  Post-op Assessment: Report given to RN and Post -op Vital signs reviewed and stable  Post vital signs: Reviewed and stable  Last Vitals:  Vitals Value Taken Time  BP 117/70 07/12/23 1415  Temp 36.2 C 07/12/23 1415  Pulse 76 07/12/23 1418  Resp 18 07/12/23 1417  SpO2 94 % 07/12/23 1418  Vitals shown include unfiled device data.  Last Pain:  Vitals:   07/12/23 1234  TempSrc:   PainSc: 0-No pain         Complications: No notable events documented.

## 2023-07-12 NOTE — Progress Notes (Signed)
Incarcerated umbilical hernia.  I recommended open umbilical hernia repair, possibly with small bowel resection.  We discussed the procedure, its risks, benefits and alternatives and the patient granted consent to proceed.  Quentin Ore, MD General, Bariatric and Minimally Invasive Surgery [facility]

## 2023-07-12 NOTE — Op Note (Signed)
Patient: Samuel Porter (October 10, 1941, [id number])  Date of Surgery: 07/12/23   Preoperative Diagnosis: Incarcerated umbilical hernia (1.8 cm x 1.9 cm on preoperative CT)   Postoperative Diagnosis: Incarcerated umbilical hernia (1.8 cm x 1.9 cm on preoperative CT)  Surgical Procedure: PRIMARY UMBILICAL HERNIA REPAIR WITH 2-0 PDS SUTURE AND NO MESH  Operative Team Members:  Surgeons and Role:    * Anvita Hirata, Hyman Hopes, MD - Primary   Anesthesiologist: Collene Schlichter, MD CRNA: Randon Goldsmith, CRNA; Garfield Cornea, CRNA; Sandie Ano, CRNA   Anesthesia: General   Fluids:  No intake/output data recorded.  Complications: None  Drains:  none   Specimen: None  Disposition:  PACU - hemodynamically stable.  Plan of Care: Continue inpatient care    Indications for Procedure: Samuel Porter is a 82 y.o. male who presented with an incarcerated umbilical hernia.    Findings:  Hernia Location: umbilical Hernia Size: 1.8 cm x 1.9 cm  Mesh Size &Type: None Mesh Position: n/a   1.8 cm x 1.9 cm umbilical hernia with incarcerated loop of small intestine  Description of Procedure:  The patient was positioned supine, padded and secured to the bed.  The abdomen was widely prepped and draped.  A time out procedure was performed.   A vertical incision was made on the umbilicus. Dissection was carried down through the subcutaneous tissue to the level of the fascia.  While cauterizing the deep dermal tissues with cut electrocautery a serosal injury was created to the loop of small intestine that was incarcerated in the hernia.  This was repaired using multiple 3-0 Vicryl sutures to imbricate the injury.  The umbilical stalk was encircled and the hernia sac amputated off the umbilical skin.  The hernia defect was extended cranially using Metzenbaum scissors to divide the fascia so the intestine could freely fall back into the abdomen.  The intestine had some signs of early  strangulation though it pinked up once the fascial defect was extended.  A sutured umbilical hernia repair was performed utilizing running 2-0 PDS suture. The defect was closed vertically under no tension.  The wound was irrigated with saline.  The umbilical skin was tacked down to the fascial repair with Vicryl suture.  The skin was closed with 4-0 Monocryl subcuticular suture and skin glue.    Ivar Drape, MD General, Bariatric, & Minimally Invasive Surgery [facility], [place]

## 2023-07-13 ENCOUNTER — Encounter (HOSPITAL_COMMUNITY): Payer: Self-pay | Admitting: Surgery

## 2023-07-13 NOTE — Progress Notes (Signed)
1 Day Post-Op   Subjective/Chief Complaint: Denies pain or nausea. No bowel function.    Objective: Vital signs in last 24 hours: Temp:  [97.2 F (36.2 C)-98.3 F (36.8 C)] 98.2 F (36.8 C) (09/27 2120) Pulse Rate:  [70-76] 76 (09/27 2120) Resp:  [14-18] 14 (09/27 2120) BP: (113-135)/(57-77) 115/77 (09/27 2120) SpO2:  [92 %-96 %] 95 % (09/27 2120) Weight:  [70.3 kg] 70.3 kg (09/27 1217) Last BM Date : 07/10/23  Intake/Output from previous day: 09/27 0701 - 09/28 0700 In: 2857 [I.V.:2857] Out: 600 [Urine:600] Intake/Output this shift: No intake/output data recorded.  A&Ox3 Unlabored respirations Abd soft, nondistended. Incision c/d/I no cellulitis or hematoma  Lab Results:  Recent Labs    07/11/23 1653 07/11/23 1659 07/12/23 0832  WBC 13.3*  --  10.5  HGB 16.0 17.3* 14.9  HCT 48.5 51.0 45.6  PLT 237  --  212   BMET Recent Labs    07/11/23 1653 07/11/23 1659 07/12/23 0832  NA 129* 131* 130*  K 4.5 4.7 4.9  CL 95*  --  101  CO2 26  --  22  GLUCOSE 155*  --  136*  BUN 39*  --  35*  CREATININE 1.68*  --  1.29*  CALCIUM 10.8*  --  9.7   PT/INR Recent Labs    07/11/23 1653  LABPROT 15.2  INR 1.2   ABG Recent Labs    07/11/23 1659  HCO3 38.1*    Studies/Results: DG Chest Port 1 View  Result Date: 07/11/2023 CLINICAL DATA:  Tachycardia EXAM: PORTABLE CHEST 1 VIEW COMPARISON:  Chest x-ray 09/21/2020.  PET-CT 11/27/2022. FINDINGS: There some scattered interstitial opacities in both lungs which appear similar to prior study and are favored as chronic. There is no new focal lung consolidation, pleural effusion or pneumothorax. The cardiomediastinal silhouette is within normal limits no acute fractures are seen. IMPRESSION: Scattered interstitial opacities in both lungs appear similar to prior study and are favored as chronic. No new focal lung consolidation. Electronically Signed   By: Darliss Cheney M.D.   On: 07/11/2023 19:20   CT ABDOMEN PELVIS W  CONTRAST  Addendum Date: 07/11/2023   ADDENDUM REPORT: 07/11/2023 19:11 ADDENDUM: These results were called by telephone at the time of interpretation on 07/11/2023 at 7:11 pm to provider [facility] , who verbally acknowledged these results. Electronically Signed   By: Darliss Cheney M.D.   On: 07/11/2023 19:11   Result Date: 07/11/2023 CLINICAL DATA:  Tachycardia and constipation. Evaluate for bowel obstruction. History of head and neck cancer. EXAM: CT ABDOMEN AND PELVIS WITH CONTRAST TECHNIQUE: Multidetector CT imaging of the abdomen and pelvis was performed using the standard protocol following bolus administration of intravenous contrast. RADIATION DOSE REDUCTION: This exam was performed according to the departmental dose-optimization program which includes automated exposure control, adjustment of the mA and/or kV according to patient size and/or use of iterative reconstruction technique. CONTRAST:  85mL OMNIPAQUE IOHEXOL 300 MG/ML  SOLN COMPARISON:  PET-CT 11/27/2022. FINDINGS: Lower chest: No acute abnormality. Hepatobiliary: No focal liver abnormality is seen. Status post cholecystectomy. No biliary dilatation. Pancreas: Unremarkable. No pancreatic ductal dilatation or surrounding inflammatory changes. Spleen: Normal in size without focal abnormality. Adrenals/Urinary Tract: Adrenal glands are unremarkable. Kidneys are normal, without renal calculi, focal lesion, or hydronephrosis. Bladder is unremarkable. Stomach/Bowel: There is an umbilical hernia containing a dilated small bowel loop. This is transition point for small bowel obstruction. Small bowel loops proximal to this level are dilated with air-fluid levels  measuring up to 4.2 cm. There is mild mesenteric edema. There is a moderate air-fluid level in the stomach as well. Small-bowel loops distal to this level are decompressed. The colon is nondilated. The appendix appears normal. There is no pneumatosis or free air. Vascular/Lymphatic: No  significant vascular findings are present. No enlarged abdominal or pelvic lymph nodes. Reproductive: Prostate gland is enlarged. Other: There is a small amount of free fluid in the pelvis. Musculoskeletal: Degenerative changes affect the spine. Bilateral hip arthroplasties are present. IMPRESSION: Umbilical hernia containing a dilated small bowel loop causing small bowel obstruction. Findings are compatible with incarcerated/strangulated hernia. There is mild mesenteric edema and small amount of free fluid in the pelvis. No pneumatosis or free air. Electronically Signed: By: Darliss Cheney M.D. On: 07/11/2023 19:08    Anti-infectives: Anti-infectives (From admission, onward)    Start     Dose/Rate Route Frequency Ordered Stop   07/11/23 1730  piperacillin-tazobactam (ZOSYN) IVPB 3.375 g        3.375 g 100 mL/hr over 30 Minutes Intravenous  Once 07/11/23 1729 07/11/23 1824       Assessment/Plan: s/p Procedure(s): HERNIA REPAIR VENTRAL (N/A)  Mobilize Pain control Await bowel function   LOS: 2 days    Berna Bue 07/13/2023

## 2023-07-13 NOTE — Plan of Care (Signed)
  Problem: Education: Goal: Knowledge of General Education information will improve Description Including pain rating scale, medication(s)/side effects and non-pharmacologic comfort measures Outcome: Progressing   

## 2023-07-14 LAB — BASIC METABOLIC PANEL
Anion gap: 4 — ABNORMAL LOW (ref 5–15)
BUN: 22 mg/dL (ref 8–23)
CO2: 21 mmol/L — ABNORMAL LOW (ref 22–32)
Calcium: 8.6 mg/dL — ABNORMAL LOW (ref 8.9–10.3)
Chloride: 105 mmol/L (ref 98–111)
Creatinine, Ser: 0.83 mg/dL (ref 0.61–1.24)
GFR, Estimated: >60 mL/min (ref >60)
Glucose, Bld: 109 mg/dL — ABNORMAL HIGH (ref 70–99)
Potassium: 4.5 mmol/L (ref 3.5–5.1)
Sodium: 130 mmol/L — ABNORMAL LOW (ref 135–145)

## 2023-07-14 LAB — CBC
HCT: 41.9 % (ref 39.0–52.0)
Hemoglobin: 13.8 g/dL (ref 13.0–17.0)
MCH: 29.7 pg (ref 26.0–34.0)
MCHC: 32.9 g/dL (ref 30.0–36.0)
MCV: 90.1 fL (ref 80.0–100.0)
Platelets: 148 10*3/uL — ABNORMAL LOW (ref 150–400)
RBC: 4.65 MIL/uL (ref 4.22–5.81)
RDW: 13.7 % (ref 11.5–15.5)
WBC: 9.6 10*3/uL (ref 4.0–10.5)
nRBC: 0 % (ref 0.0–0.2)

## 2023-07-14 LAB — MAGNESIUM: Magnesium: 2.1 mg/dL (ref 1.7–2.4)

## 2023-07-14 MED ORDER — PANTOPRAZOLE SODIUM 40 MG PO TBEC
40.0000 mg | DELAYED_RELEASE_TABLET | Freq: Every day | ORAL | Status: DC
  Administered 2023-07-14 – 2023-07-15 (×2): 40 mg via ORAL
  Filled 2023-07-14 (×2): qty 1

## 2023-07-14 MED ORDER — POLYETHYLENE GLYCOL 3350 17 G PO PACK
17.0000 g | PACK | Freq: Two times a day (BID) | ORAL | Status: DC
  Administered 2023-07-14 – 2023-07-15 (×3): 17 g via ORAL
  Filled 2023-07-14 (×3): qty 1

## 2023-07-14 NOTE — Progress Notes (Signed)
   Progress Note  2 Days Post-Op  Subjective: Pt reports pain well controlled and tolerating diet without nausea or vomiting. He is passing flatus but has not had a BM in several days.   Objective: Vital signs in last 24 hours: Temp:  [97.7 F (36.5 C)-98.4 F (36.9 C)] 97.7 F (36.5 C) (09/29 0759) Pulse Rate:  [69-75] 69 (09/29 0759) Resp:  [16-18] 18 (09/29 0759) BP: (113-128)/(56-76) 127/61 (09/29 0759) SpO2:  [93 %-97 %] 93 % (09/29 0759) Last BM Date : 07/10/23  Intake/Output from previous day: 09/28 0701 - 09/29 0700 In: 909.9 [I.V.:909.9] Out: 1410 [Urine:1410] Intake/Output this shift: Total I/O In: 1732.3 [I.V.:1732.3] Out: -   PE: General: pleasant, WD, elderly male, NAD Heart: regular, rate, and rhythm.  Lungs:  Respiratory effort nonlabored Abd: soft, NT, ND, +BS, incision C/D/I MS: all 4 extremities are symmetrical with no cyanosis, clubbing, or edema. Psych: A&Ox3 with an appropriate affect.    Lab Results:  Recent Labs    07/12/23 0832 07/14/23 0440  WBC 10.5 9.6  HGB 14.9 13.8  HCT 45.6 41.9  PLT 212 148*   BMET Recent Labs    07/12/23 0832 07/14/23 0440  NA 130* 130*  K 4.9 4.5  CL 101 105  CO2 22 21*  GLUCOSE 136* 109*  BUN 35* 22  CREATININE 1.29* 0.83  CALCIUM 9.7 8.6*   PT/INR Recent Labs    07/11/23 1653  LABPROT 15.2  INR 1.2   CMP     Component Value Date/Time   NA 130 (L) 07/14/2023 0440   K 4.5 07/14/2023 0440   CL 105 07/14/2023 0440   CO2 21 (L) 07/14/2023 0440   GLUCOSE 109 (H) 07/14/2023 0440   BUN 22 07/14/2023 0440   CREATININE 0.83 07/14/2023 0440   CREATININE 0.91 11/01/2022 0954   CALCIUM 8.6 (L) 07/14/2023 0440   PROT 7.8 07/11/2023 1653   ALBUMIN 3.9 07/11/2023 1653   AST 17 07/11/2023 1653   AST 17 11/01/2022 0954   ALT 15 07/11/2023 1653   ALT 16 11/01/2022 0954   ALKPHOS 73 07/11/2023 1653   BILITOT 1.1 07/11/2023 1653   BILITOT 1.1 11/01/2022 0954   GFRNONAA >60 07/14/2023 0440    GFRNONAA >60 11/01/2022 0954   GFRAA >60 06/17/2018 0457   Lipase     Component Value Date/Time   LIPASE 12 07/11/2023 1653       Studies/Results: No results found.  Anti-infectives: Anti-infectives (From admission, onward)    Start     Dose/Rate Route Frequency Ordered Stop   07/11/23 1730  piperacillin-tazobactam (ZOSYN) IVPB 3.375 g        3.375 g 100 mL/hr over 30 Minutes Intravenous  Once 07/11/23 1729 07/11/23 1824        Assessment/Plan  POD2 ventral hernia repair - patient tolerating diet and passing flatus but no BM - increase miralax to BID  - continue to mobilize - possible DC tomorrow if having bowel function   FEN: reg diet, SLIV VTE: LMWH ID: [email or web address] 9/26   LOS: 3 days      Juliet Rude, [facility] 07/14/2023, 11:29 AM Please see Amion for pager number during day hours 7:00am-4:30pm

## 2023-07-14 NOTE — Plan of Care (Signed)
  Problem: Education: Goal: Knowledge of General Education information will improve Description Including pain rating scale, medication(s)/side effects and non-pharmacologic comfort measures Outcome: Progressing   

## 2023-07-14 NOTE — Progress Notes (Signed)
   07/14/23 1413  Mobility  Activity Ambulated independently in hallway  Level of Assistance Independent  Distance Ambulated (ft) 550 ft  Activity Response Tolerated well  Mobility Referral Yes  $Mobility charge 1 Mobility  Mobility Specialist Start Time (ACUTE ONLY) 1401  Mobility Specialist Stop Time (ACUTE ONLY) 1411  Mobility Specialist Time Calculation (min) (ACUTE ONLY) 10 min   Mobility Specialist: Progress Note  Pt requested mobility session - received in chair. Ambulated independently using IV pole. Pt was asymptomatic throughout session, had no complaints, and returned to the room without fault.  Returned to chair with all needs met - call bell within reach.  Barnie Mort, BS Mobility Specialist Please contact via SecureChat or Rehab office at [phone].

## 2023-07-14 NOTE — Progress Notes (Signed)
   07/14/23 1158  Mobility  Activity Ambulated independently in hallway  Level of Assistance Independent  Assistive Device Other (Comment) (Iv Pole)  Distance Ambulated (ft) 700 ft  Activity Response Tolerated well  Mobility Referral Yes  $Mobility charge 1 Mobility  Mobility Specialist Start Time (ACUTE ONLY) 1133  Mobility Specialist Stop Time (ACUTE ONLY) 1153  Mobility Specialist Time Calculation (min) (ACUTE ONLY) 20 min   Mobility Specialist: Progress Note  Pt agreeable to mobility session - received in chair. Ambulated independently with IV Pole.  Pt was asymptomatic throughout session, had no complaints, and returned to the room without fault.  Returned to chair with all needs met - call bell within reach.   Pt is easygoing, and motivated to walk.   Barnie Mort, BS Mobility Specialist Please contact via SecureChat or Rehab office at [phone].

## 2023-07-15 ENCOUNTER — Other Ambulatory Visit: Payer: Self-pay

## 2023-07-15 MED ORDER — BISACODYL 10 MG RE SUPP
10.0000 mg | Freq: Once | RECTAL | Status: AC
  Administered 2023-07-15: 10 mg via RECTAL
  Filled 2023-07-15: qty 1

## 2023-07-15 NOTE — Care Management Important Message (Signed)
Important Message  Patient Details  Name: Samuel Porter MRN: [id number] Date of Birth: 1941/03/14   Important Message Given:  Yes - Medicare IM     Sherilyn Banker 07/15/2023, 12:30 PM

## 2023-07-15 NOTE — Progress Notes (Signed)
-  s/p UMBILICAL HERNIA REPAIR , 9/27   07/15/23 1308  TOC Brief Assessment  Insurance and Status Reviewed  Patient has primary care physician Yes  Home environment has been reviewed Resides with wife who has Parkinsons'  Prior level of function: PTA independent with ADL's, no DME usage.  Prior/Current Home Services No current home services  Social Determinants of Health Reivew SDOH reviewed no interventions necessary  Readmission risk has been reviewed No  Transition of care needs no transition of care needs at this time   No present needs identified per NCM. Plan: Awaiting BM, the d/c to home. Pt with transportation to home by son. Pt without RX med concerns. Post hospital f/u noted with PCP ON [facility]. Gae Gallop RN,BSN,CM [phone]

## 2023-07-15 NOTE — Progress Notes (Signed)
Mobility Specialist: Progress Note   07/15/23 1607  Mobility  Activity Ambulated independently in hallway  Level of Assistance Independent  Assistive Device Other (Comment) (IV pole)  Distance Ambulated (ft) 550 ft  Activity Response Tolerated well  Mobility Referral Yes  $Mobility charge 1 Mobility  Mobility Specialist Start Time (ACUTE ONLY) 0934  Mobility Specialist Stop Time (ACUTE ONLY) 0943  Mobility Specialist Time Calculation (min) (ACUTE ONLY) 9 min    Received pt in chair having no complaints and agreeable to mobility. Pt was asymptomatic throughout ambulation and returned to room w/o fault. Left in chair w/ call bell in reach and all needs met. RN in room.    Maurene Capes Mobility Specialist Please contact via SecureChat or Rehab office at [phone]

## 2023-07-15 NOTE — Plan of Care (Signed)

## 2023-07-15 NOTE — Progress Notes (Addendum)
   Progress Note  3 Days Post-Op  Subjective: Patient reports soreness around his incision that is currently well controlled. No other areas of pain. Tolerating diet without n/v. Passing flatus. No BM. Voiding. Mobilizing without issues.   Denies any cp or sob.   Objective: Vital signs in last 24 hours: Temp:  [97.7 F (36.5 C)-98 F (36.7 C)] 97.7 F (36.5 C) (09/30 0744) Pulse Rate:  [71-79] 71 (09/30 0410) Resp:  [15-18] 18 (09/30 0744) BP: (98-123)/(53-66) 123/65 (09/30 0744) SpO2:  [96 %-97 %] 96 % (09/30 0744) Last BM Date : 07/10/23  Intake/Output from previous day: 09/29 0701 - 09/30 0700 In: 2361 [P.O.:240; I.V.:2121] Out: 1050 [Urine:1050] Intake/Output this shift: Total I/O In: 240 [P.O.:240] Out: -   PE: General: pleasant, WD, elderly male, NAD Heart: reg Lungs:  Respiratory effort nonlabored Abd: Soft, no distension, appropriately tender around incision, no rigidity or guarding and otherwise NT, +BS. Incision with glue intact appears well and are without drainage, bleeding, or signs of infection  Psych: A&Ox3 with an appropriate affect.   Lab Results:  Recent Labs    07/14/23 0440  WBC 9.6  HGB 13.8  HCT 41.9  PLT 148*   BMET Recent Labs    07/14/23 0440  NA 130*  K 4.5  CL 105  CO2 21*  GLUCOSE 109*  BUN 22  CREATININE 0.83  CALCIUM 8.6*   PT/INR No results for input(s): "LABPROT", "INR" in the last 72 hours.  CMP     Component Value Date/Time   NA 130 (L) 07/14/2023 0440   K 4.5 07/14/2023 0440   CL 105 07/14/2023 0440   CO2 21 (L) 07/14/2023 0440   GLUCOSE 109 (H) 07/14/2023 0440   BUN 22 07/14/2023 0440   CREATININE 0.83 07/14/2023 0440   CREATININE 0.91 11/01/2022 0954   CALCIUM 8.6 (L) 07/14/2023 0440   PROT 7.8 07/11/2023 1653   ALBUMIN 3.9 07/11/2023 1653   AST 17 07/11/2023 1653   AST 17 11/01/2022 0954   ALT 15 07/11/2023 1653   ALT 16 11/01/2022 0954   ALKPHOS 73 07/11/2023 1653   BILITOT 1.1 07/11/2023 1653    BILITOT 1.1 11/01/2022 0954   GFRNONAA >60 07/14/2023 0440   GFRNONAA >60 11/01/2022 0954   GFRAA >60 06/17/2018 0457   Lipase     Component Value Date/Time   LIPASE 12 07/11/2023 1653       Studies/Results: No results found.  Anti-infectives: Anti-infectives (From admission, onward)    Start     Dose/Rate Route Frequency Ordered Stop   07/11/23 1730  piperacillin-tazobactam (ZOSYN) IVPB 3.375 g        3.375 g 100 mL/hr over 30 Minutes Intravenous  Once 07/11/23 1729 07/11/23 1824        Assessment/Plan  POD3 s/p primary umbilical hernia repair and serosal repair by Dr. Dossie Der on 07/12/23 - Cont reg diet + shakes. He is passing flatus but has not had a bm. Anticipate d/c after patient has a bm.  - Mobilize - Pulm toilet - continue to mobilize - Will need pcp f/u for AKI, hyponatremia, etc  FEN: reg diet VTE: LMWH ID: zosyn 9/26    LOS: 4 days      Jacinto Halim, [facility] 07/15/2023, 10:25 AM Please see Amion for pager number during day hours 7:00am-4:30pm

## 2023-07-16 LAB — CULTURE, BLOOD (ROUTINE X 2)
Culture: NO GROWTH
Special Requests: ADEQUATE

## 2023-07-16 MED ORDER — ASPIRIN 81 MG PO TBEC: 81.0000 mg | DELAYED_RELEASE_TABLET | Freq: Every day | ORAL | 12 refills | Status: DC

## 2023-07-16 MED ORDER — ACETAMINOPHEN 500 MG PO TABS: 1000.0000 mg | ORAL_TABLET | Freq: Three times a day (TID) | ORAL | Status: DC | PRN

## 2023-07-16 MED ORDER — OXYCODONE HCL 5 MG PO TABS: 5.0000 mg | ORAL_TABLET | Freq: Four times a day (QID) | ORAL | 0 refills | Status: DC | PRN

## 2023-07-16 MED ORDER — POLYETHYLENE GLYCOL 3350 17 G PO PACK: 17.0000 g | PACK | Freq: Two times a day (BID) | ORAL | Status: AC | PRN

## 2023-07-16 NOTE — Discharge Summary (Signed)
Patient ID: Samuel Porter [id number] May 12, 1941 82 y.o.  Admit date: 07/11/2023 Discharge date: 07/16/2023   Discharge Diagnosis Incarcerated umbilical hernia s/p primary umbilical hernia repair and serosal repair by Dr. Dossie Der on 07/12/23   Consultants None  H&P: Patient is an 82 year old male who presented to the emergency department [facility] with complaints of abdominal pain, nausea, and vomiting that been going on for 48 hours.  The patient has had umbilical hernia for many years.  It is always sticking out and he has not been able to get it to reduce for quite a long time.  He has been extremely constipated recently as well.  He feels that this has contributed to the hernia.  He reports that the hernia looks the same as it has for the past 4 days.  If anything, he states that it is smaller.  Patient denies any significant abdominal pain.   Of note he has had a significant weight loss over the last year due to diagnosis with head and neck cancer.  He underwent immunotherapy at [facility] and has had good response to the treatment, but no longer has any significant ability to taste  Procedures Dr. Dossie Der - 07/12/23 - primary umbilical hernia repair and serosal repair   10/1 Doing well. No abdominal pain. Has not required any prn pain medication over the last 24 hours. Tolerating diet without n/v. Passing flatus. BM yesterday. Mobilizing.   Hospital Course:  Patient presented as above and was found to have Incarcerated umbilical hernia for which he was taken to the OR on 9/27 by Dr. Dossie Der and underwent primary umbilical hernia repair and serosal repair. Patient tolerated the procedure well. On POD4, the patient was voiding well, tolerating diet, ambulating well, pain well controlled, vital signs stable, incisions c/d/I, having bowel function and felt stable for discharge home. Discussed discharge instructions, restrictions and return/call back precautions. Follow up  arranged as noted below.   To note during admission patient did have tachycardia in the ED with initial EKG reading Sinus tachycardia vs afib (sinus rhythm on repeat EKG), AKI on admission that resolved on repeat labs and hyponatremia (stable at 130 on last labs, 9/29). Recommend close pcp f/u at d/c - patient understands this.   Physical Exam: General: pleasant, WD, elderly male, NAD Heart: reg Lungs:  Respiratory effort nonlabored Abd: Soft, no distension, appropriately tender around incision, no rigidity or guarding and otherwise NT, +BS. Incision with glue intact appears well and are without drainage, bleeding, or signs of infection  Psych: A&Ox3 with an appropriate affect.   Allergies as of 07/16/2023   No Known Allergies      Medication List     STOP taking these medications    ibuprofen 200 MG tablet Commonly known as: ADVIL       TAKE these medications    acetaminophen 500 MG tablet Commonly known as: TYLENOL Take 2 tablets (1,000 mg total) by mouth every 8 (eight) hours as needed for mild pain. What changed:  when to take this reasons to take this   amLODipine 5 MG tablet Commonly known as: NORVASC Take 5 mg by mouth daily.   aspirin EC 81 MG tablet Take 1 tablet (81 mg total) by mouth daily. Swallow whole.   cholecalciferol 1000 units tablet Commonly known as: VITAMIN D Take 1,000 Units by mouth as directed. Twice weekly   dorzolamide-timolol 2-0.5 % ophthalmic solution Commonly known as: COSOPT Place 1 drop into both eyes 2 (two) times daily.  finasteride 5 MG tablet Commonly known as: PROSCAR Take 5 mg by mouth daily.   latanoprost 0.005 % ophthalmic solution Commonly known as: XALATAN Place 1 drop into both eyes at bedtime.   oxyCODONE 5 MG immediate release tablet Commonly known as: Oxy IR/ROXICODONE Take 1 tablet (5 mg total) by mouth every 6 (six) hours as needed for breakthrough pain.   polyethylene glycol 17 g packet Commonly known as:  MIRALAX / GLYCOLAX Take 17 g by mouth 2 (two) times daily as needed.   sulfamethoxazole-trimethoprim 800-160 MG tablet Commonly known as: BACTRIM DS Take 1 tablet by mouth 2 (two) times daily.   Viagra 25 MG tablet Generic drug: sildenafil Take 25 mg by mouth as needed for erectile dysfunction.          Follow-up Information     Stechschulte, Hyman Hopes, MD. Go on 07/30/2023.   Specialty: Surgery Why: Your appointment is 10/15 at 2:40pm Arrive early to check in, fill out paperwork, Bring photo ID and insurance information Contact information: [address] [place] [address] [phone]         Farris Has, MD Follow up.   Specialty: Family Medicine Why: For follow up Contact information: [address] [place] [address] [phone]                 Signed: Leary Roca, [facility] 07/16/2023, 9:33 AM Please see Amion for pager number during day hours 7:00am-4:30pm

## 2023-07-16 NOTE — Plan of Care (Signed)
  Problem: Education: Goal: Knowledge of General Education information will improve Description Including pain rating scale, medication(s)/side effects and non-pharmacologic comfort measures Outcome: Completed/Met   Problem: Health Behavior/Discharge Planning: Goal: Ability to manage health-related needs will improve Outcome: Completed/Met   Problem: Clinical Measurements: Goal: Ability to maintain clinical measurements within normal limits will improve Outcome: Completed/Met Goal: Will remain free from infection Outcome: Completed/Met Goal: Diagnostic test results will improve Outcome: Completed/Met Goal: Cardiovascular complication will be avoided Outcome: Completed/Met   Problem: Activity: Goal: Risk for activity intolerance will decrease Outcome: Completed/Met   Problem: Nutrition: Goal: Adequate nutrition will be maintained Outcome: Completed/Met   Problem: Coping: Goal: Level of anxiety will decrease Outcome: Completed/Met   Problem: Elimination: Goal: Will not experience complications related to bowel motility Outcome: Completed/Met Goal: Will not experience complications related to urinary retention Outcome: Completed/Met   Problem: Pain Managment: Goal: General experience of comfort will improve Outcome: Completed/Met   Problem: Safety: Goal: Ability to remain free from injury will improve Outcome: Completed/Met   Problem: Skin Integrity: Goal: Risk for impaired skin integrity will decrease Outcome: Completed/Met

## 2023-07-16 NOTE — Progress Notes (Signed)
Discharge instructions reviewed with pt and his son.  Copy of instructions given to pt. Pt/son informed scripts sent to pt's pharmacy for pick up. Pt and son verbalized understanding of instructions and when f/u appointment is.  Pt to be d/c'd via wheelchair with belongings, with son.            To be escorted by staff/hospital volunteer.   Adhrit Krenz,RN SWOT

## 2023-07-17 LAB — CULTURE, BLOOD (ROUTINE X 2): Culture: NO GROWTH

## 2023-07-21 ENCOUNTER — Other Ambulatory Visit: Payer: Self-pay

## 2023-07-22 DIAGNOSIS — R002 Palpitations: Secondary | ICD-10-CM | POA: Diagnosis not present

## 2023-07-22 DIAGNOSIS — K5900 Constipation, unspecified: Secondary | ICD-10-CM | POA: Diagnosis not present

## 2023-07-22 DIAGNOSIS — Z09 Encounter for follow-up examination after completed treatment for conditions other than malignant neoplasm: Secondary | ICD-10-CM | POA: Diagnosis not present

## 2023-07-22 DIAGNOSIS — K420 Umbilical hernia with obstruction, without gangrene: Secondary | ICD-10-CM | POA: Diagnosis not present

## 2023-08-06 DIAGNOSIS — C4492 Squamous cell carcinoma of skin, unspecified: Secondary | ICD-10-CM | POA: Diagnosis not present

## 2023-08-06 DIAGNOSIS — C779 Secondary and unspecified malignant neoplasm of lymph node, unspecified: Secondary | ICD-10-CM | POA: Diagnosis not present

## 2023-08-06 DIAGNOSIS — E079 Disorder of thyroid, unspecified: Secondary | ICD-10-CM | POA: Diagnosis not present

## 2023-08-16 ENCOUNTER — Other Ambulatory Visit: Payer: Self-pay

## 2023-08-16 ENCOUNTER — Ambulatory Visit (HOSPITAL_BASED_OUTPATIENT_CLINIC_OR_DEPARTMENT_OTHER): Payer: Medicare HMO | Attending: Otolaryngology | Admitting: Physical Therapy

## 2023-08-16 DIAGNOSIS — M25611 Stiffness of right shoulder, not elsewhere classified: Secondary | ICD-10-CM

## 2023-08-16 DIAGNOSIS — G8929 Other chronic pain: Secondary | ICD-10-CM | POA: Diagnosis not present

## 2023-08-16 DIAGNOSIS — M25511 Pain in right shoulder: Secondary | ICD-10-CM | POA: Diagnosis not present

## 2023-08-16 NOTE — Therapy (Signed)
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   Procedure: LEFT TOTAL HIP ARTHROPLASTY ANTERIOR APPROACH;  Surgeon: Ollen Gross, MD;  Location: [facility];  Service: Orthopedics;  Laterality: Left;   VENTRAL HERNIA REPAIR N/A 07/12/2023   Procedure: HERNIA REPAIR VENTRAL;  Surgeon: Quentin Ore, MD;  Location: [facility];  Service: General;  Laterality: N/A;   Patient Active Problem List   Diagnosis Date Noted   Incarcerated umbilical hernia 07/11/2023   Squamous cell carcinoma of skin 11/30/2022   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  Rationale for Evaluation and Treatment: Rehabilitation  ONSET DATE:   SUBJECTIVE:                                                                                                                                                                                      SUBJECTIVE STATEMENT: In July 2024 the patient had a oral surgery and neck surgery to remove cancer.  He als also removed his lymph nodes.  He reports since that point he has had pain in his shoulder and neck.  He reports limited ability to lift his shoulder.  He reports over the past 2 months he has had an improved ability to lift his  shoulder but is still limited.  He reports the pain is down to a consistent 3 out of 10.  He is right-handed.  He has milder limitations with the left arm.   Hand dominance: Right  PERTINENT HISTORY: Hip OA; THA 2018  Cancer; bverntral hernia repair 07/12/2023  PAIN:  Are you having pain? Yes: NPRS scale: 3/10 Pain location: right shoulder  Pain description: aching  Aggravating factors: use of the arm, was bothersome at night  Relieving factors: not using the arm   PRECAUTIONS: None  RED FLAGS: None  WEIGHT BEARING RESTRICTIONS: No  FALLS:  Has patient fallen in last 6 months? 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  DIAGNOSTIC FINDINGS:  Nothing   PATIENT SURVEYS :  FOTO 53 baseline 68 expected    COGNITION: Overall cognitive status: Within functional limits for tasks assessed     SENSATION: Occasional numbness    POSTURE:   UPPER EXTREMITY ROM:   Active ROM Right eval Left eval  Shoulder flexion 140 140  Shoulder extension    Shoulder abduction    Shoulder adduction    Shoulder internal rotation Painful behind the back but full     Shoulder external rotation Painful behind the head but full    Elbow flexion    Elbow extension    Wrist flexion    Wrist extension    Wrist ulnar deviation    Wrist radial deviation    Wrist pronation    Wrist supination    (Blank rows = not tested)  Passive right external rotation 35 degrees  UPPER EXTREMITY MMT:  MMT Right eval Left eval  Shoulder flexion 8.6 13.8  Shoulder extension    Shoulder abduction    Shoulder adduction    Shoulder internal rotation 9.6 8.1  Shoulder external rotation 9.2 7.7  Middle  trapezius    Lower trapezius    Elbow flexion    Elbow extension    Wrist flexion    Wrist extension    Wrist ulnar deviation    Wrist radial deviation    Wrist pronation    Wrist supination    Grip strength (lbs)    (Blank rows = not tested)    PALPATION:  Tender to palpation in the right upper trap.  Tightness in anterior shoulder.   TODAY'S TREATMENT:                                                                                                                                         DATE: Access Code: [email or web address] URL: [email or web address] Date: 08/16/2023 Prepared by: Lorayne Bender  Exercises - Supine Shoulder Flexion Extension AAROM with Dowel  - 1 x daily - 7 x weekly - 3 sets - 10 reps - Shoulder extension with resistance - Neutral  - 1 x daily - 7 x weekly - 3 sets - 10 reps - Scapular Retraction with Resistance  - 1 x daily - 7 x weekly - 3 sets - 10 reps  Manual: Trigger point release to upper trap  PATIENT EDUCATION: Education details: HEP,  Person educated: Patient Education method: Explanation, Demonstration, Tactile cues, Verbal cues, and Handouts Education comprehension:  verbalized understanding, returned demonstration, verbal cues required, tactile cues required, and needs further education  HOME EXERCISE PROGRAM: Access Code: [email or web address] URL: [email or web address] Date: 08/16/2023 Prepared by: Lorayne Bender  Exercises - Supine Shoulder Flexion Extension AAROM with Dowel  - 1 x daily - 7 x weekly - 3 sets - 10 reps - Shoulder extension with resistance - Neutral  - 1 x daily - 7 x weekly - 3 sets - 10 reps - Scapular Retraction with Resistance  - 1 x daily - 7 x weekly - 3 sets - 10 reps  ASSESSMENT:  CLINICAL IMPRESSION: Patient is an 82 year old male who presents with decreased functional use of his right UE and pain with functional use of his right UE. He has limited active motion and strength. He has tightness in his upper  trap and anterior shoulder. He reports he would frequently go to the gym and use the pool at the [facility] prior to his cancer surgery. He would beneift from skilled therapy on the land and water.   OBJECTIVE IMPAIRMENTS: decreased activity tolerance, decreased ROM, decreased strength, impaired UE functional use, and pain.   ACTIVITY LIMITATIONS: carrying, lifting, sleeping, and reach over head  PARTICIPATION LIMITATIONS: meal prep, cleaning, laundry, shopping, yard work, and going to the gym   PERSONAL FACTORS: 1-2 comorbidities: recent hernia repair; Cancer   are also affecting patient's functional outcome.   REHAB POTENTIAL: Good  CLINICAL DECISION MAKING: Stable/uncomplicated  EVALUATION COMPLEXITY: Low  GOALS: Goals reviewed with patient? Yes  SHORT TERM GOALS: Target date: 09/13/2023    Patient will increase passive right shoulder external rotation by 20 degrees Baseline: Goal status: INITIAL  2.  Patient will get demonstrate 160 degrees of active right shoulder flexion without increased pain Baseline:  Goal status: INITIAL  3.  Patient will be independent with base exercise program Baseline:  Goal status: INITIAL  4.  Patient will report a reduction in overall pain from 3 out of 10 to 1 out of 10 with daily activities Baseline:  Goal status: INITIAL   LONG TERM GOALS: Target date: 10/11/2023    Patient will perform all ADLs and IADLs without increase right shoulder pain Baseline:  Goal status: INITIAL  2.  Patient will sleep through the night without increased pain Baseline:  Goal status: INITIAL  3.  Patient will return to the [facility] with a program for water and land Baseline:  Goal status: INITIAL   PLAN: PT FREQUENCY: 2x/week  PT DURATION: 8 weeks  PLANNED INTERVENTIONS: 97110-Therapeutic exercises, 97530- Therapeutic activity, [id number]- Neuromuscular re-education, 97535- Self Care, [id number]- Manual therapy, [id number]- Gait training, [phone]- Aquatic Therapy,  97014- Electrical stimulation (unattended), 97035- Ultrasound, Patient/Family education, Stair training, Taping, Dry Needling, DME instructions, Cryotherapy, and Moist heat   PLAN FOR NEXT SESSION: Consider trigger point release to upper trap.  Consider manual therapy to improve passive external rotation.  Progress exercises as tolerated.   Dessie Coma, PT 08/16/2023, 2:42 PM

## 2023-08-20 ENCOUNTER — Ambulatory Visit (HOSPITAL_BASED_OUTPATIENT_CLINIC_OR_DEPARTMENT_OTHER): Payer: Medicare HMO | Admitting: Physical Therapy

## 2023-08-20 ENCOUNTER — Encounter (HOSPITAL_BASED_OUTPATIENT_CLINIC_OR_DEPARTMENT_OTHER): Payer: Self-pay | Admitting: Physical Therapy

## 2023-08-20 DIAGNOSIS — G8929 Other chronic pain: Secondary | ICD-10-CM

## 2023-08-20 DIAGNOSIS — M25611 Stiffness of right shoulder, not elsewhere classified: Secondary | ICD-10-CM

## 2023-08-20 DIAGNOSIS — M25511 Pain in right shoulder: Secondary | ICD-10-CM | POA: Diagnosis not present

## 2023-08-20 NOTE — Therapy (Signed)
OUTPATIENT PHYSICAL THERAPY UPPER EXTREMITY EVALUATION   Patient Name: Samuel Porter MRN: [id number] DOB:[birth year], 82 y.o., male Today's Date: 08/20/2023  END OF SESSION:  PT End of Session - 08/20/23 1531     Visit Number 2    Number of Visits 16    Date for PT Re-Evaluation 10/11/23    PT Start Time 0315    PT Stop Time 0358    PT Time Calculation (min) 43 min    Activity Tolerance Patient tolerated treatment well    Behavior During Therapy [facility] for tasks assessed/performed             Past Medical History:  Diagnosis Date   Arthritis    oa   BPH (benign prostatic hyperplasia)    Cancer (HCC) 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   Cataracts, bilateral    Glaucoma    Hypertension    Vitamin D deficiency    Past Surgical History:  Procedure Laterality Date   CHOLECYSTECTOMY     colonscopy  2017   JOINT REPLACEMENT Right    hip    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   Procedure: LEFT TOTAL HIP ARTHROPLASTY ANTERIOR APPROACH;  Surgeon: Ollen Gross, MD;  Location: [facility];  Service: Orthopedics;  Laterality: Left;   VENTRAL HERNIA REPAIR N/A 07/12/2023   Procedure: HERNIA REPAIR VENTRAL;  Surgeon: Quentin Ore, MD;  Location: [facility];  Service: General;  Laterality: N/A;   Patient Active Problem List   Diagnosis Date Noted   Incarcerated umbilical hernia 07/11/2023   Squamous cell carcinoma of skin 11/30/2022   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   OA (osteoarthritis) of hip 10/31/2016    PCP: Farris Has MD   REFERRING PROVIDER: Kennyth Arnold MD   REFERRING DIAG: Right Shoulder pain and stiffness   THERAPY DIAG:  Chronic right shoulder pain  Stiffness of right shoulder, not elsewhere classified  Rationale for Evaluation and Treatment: Rehabilitation  ONSET DATE:   SUBJECTIVE:                                                                                                                                                                                       SUBJECTIVE STATEMENT: The patient tolerated initial eval well.He had no significant pain with treatment. He has been working on his exercises.   Eval: In July 2024 the patient had a oral surgery and neck surgery to remove cancer.  He als also removed his lymph nodes.  He reports since that point he has had pain in his shoulder and neck.  He reports limited ability to lift his shoulder.  He reports over the past 2 months he has had an improved ability to lift his shoulder but is still limited.  He reports the pain is down to a consistent 3 out of 10.  He is right-handed.  He has milder limitations with the left arm.   Hand dominance: Right  PERTINENT HISTORY: Hip OA; THA 2018  Cancer; bverntral hernia repair 07/12/2023  PAIN:  Are you having pain? Yes: NPRS scale: 3/10 Pain location: right shoulder  Pain description: aching  Aggravating factors: use of the arm, was bothersome at night  Relieving factors: not using the arm   PRECAUTIONS: None  RED FLAGS: None   WEIGHT BEARING RESTRICTIONS: No  FALLS:  Has patient fallen in last 6 months? 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  DIAGNOSTIC FINDINGS:  Nothing   PATIENT SURVEYS :  FOTO 53 baseline 68 expected    COGNITION: Overall cognitive status: Within functional limits for tasks assessed     SENSATION: Occasional numbness    POSTURE:   UPPER EXTREMITY ROM:   Active ROM Right eval Left eval  Shoulder flexion 140 140  Shoulder extension    Shoulder abduction    Shoulder adduction    Shoulder internal rotation Painful behind the back but full     Shoulder external  rotation Painful behind the head but full    Elbow flexion    Elbow extension    Wrist flexion    Wrist extension    Wrist ulnar deviation    Wrist radial deviation    Wrist pronation    Wrist supination    (Blank rows = not tested)  Passive right external rotation 35 degrees  UPPER EXTREMITY MMT:  MMT Right eval Left eval  Shoulder flexion 8.6 13.8  Shoulder extension    Shoulder abduction    Shoulder adduction    Shoulder internal rotation 9.6 8.1  Shoulder external rotation 9.2 7.7  Middle trapezius    Lower trapezius    Elbow flexion    Elbow extension    Wrist flexion    Wrist extension    Wrist ulnar deviation    Wrist radial deviation    Wrist pronation    Wrist supination    Grip strength (lbs)    (Blank rows = not tested)    PALPATION:  Tender to palpation in the right upper trap.  Tightness in anterior shoulder.   TODAY'S TREATMENT:  DATE:  11/5  Manual: trigger point release to upper trap and PEc; active release to pec; Posterior glides grade II and III   Bilateral ER 3x10  Wand flexion 1 lbs 3x10  Supine flexion 1lb 3x10  D2 flexion 2x10   Row 2x15 red  Shoulder extension 2x15 red    Eval: Access Code: [email or web address] URL: [email or web address] Date: 08/16/2023 Prepared by: Lorayne Bender  Exercises - Supine Shoulder Flexion Extension AAROM with Dowel  - 1 x daily - 7 x weekly - 3 sets - 10 reps - Shoulder extension with resistance - Neutral  - 1 x daily - 7 x weekly - 3 sets - 10 reps - Scapular Retraction with Resistance  - 1 x daily - 7 x weekly - 3 sets - 10 reps  Manual: Trigger point release to upper trap  PATIENT EDUCATION: Education details: HEP,  Person educated: Patient Education method: Explanation, Demonstration, Tactile cues, Verbal cues, and Handouts Education comprehension:  verbalized understanding, returned demonstration, verbal cues required, tactile cues required, and needs further education  HOME EXERCISE PROGRAM: Access Code: [facility] URL: [email or web address] Date: 08/16/2023 Prepared by: Lorayne Bender  Exercises - Supine Shoulder Flexion Extension AAROM with Dowel  - 1 x daily - 7 x weekly - 3 sets - 10 reps - Shoulder extension with resistance - Neutral  - 1 x daily - 7 x weekly - 3 sets - 10 reps - Scapular Retraction with Resistance  - 1 x daily - 7 x weekly - 3 sets - 10 reps  ASSESSMENT:  CLINICAL IMPRESSION: The patient tolerated treatment well. He had tightness in his pec with manual therapy. By the end of the session he had full motion with forward flexion without pain. We reviewed and updated his HEP.      Eval: Patient is an 82 year old male who presents with decreased functional use of his right UE and pain with functional use of his right UE. He has limited active motion and strength. He has tightness in his upper trap and anterior shoulder. He reports he would frequently go to the gym and use the pool at the [facility] prior to his cancer surgery. He would beneift from skilled therapy on the land and water.   OBJECTIVE IMPAIRMENTS: decreased activity tolerance, decreased ROM, decreased strength, impaired UE functional use, and pain.   ACTIVITY LIMITATIONS: carrying, lifting, sleeping, and reach over head  PARTICIPATION LIMITATIONS: meal prep, cleaning, laundry, shopping, yard work, and going to the gym   PERSONAL FACTORS: 1-2 comorbidities: recent hernia repair; Cancer   are also affecting patient's functional outcome.   REHAB POTENTIAL: Good  CLINICAL DECISION MAKING: Stable/uncomplicated  EVALUATION COMPLEXITY: Low  GOALS: Goals reviewed with patient? Yes  SHORT TERM GOALS: Target date: 09/13/2023    Patient will increase passive right shoulder external rotation by 20 degrees Baseline: Goal status:  INITIAL  2.  Patient will get demonstrate 160 degrees of active right shoulder flexion without increased pain Baseline:  Goal status: INITIAL  3.  Patient will be independent with base exercise program Baseline:  Goal status: INITIAL  4.  Patient will report a reduction in overall pain from 3 out of 10 to 1 out of 10 with daily activities Baseline:  Goal status: INITIAL   LONG TERM GOALS: Target date: 10/11/2023    Patient will perform all ADLs and IADLs without increase right shoulder pain Baseline:  Goal status: INITIAL  2.  Patient will sleep through the night without increased pain Baseline:  Goal status: INITIAL  3.  Patient will return to the [facility] with a program for water and land Baseline:  Goal status: INITIAL   PLAN: PT FREQUENCY: 2x/week  PT DURATION: 8 weeks  PLANNED INTERVENTIONS: 97110-Therapeutic exercises, 97530- Therapeutic activity, [id number]- Neuromuscular re-education, 97535- Self Care, [id number]- Manual therapy, [id number]- Gait training, [phone]- Aquatic Therapy, 97014- Electrical stimulation (unattended), 97035- Ultrasound, Patient/Family education, Stair training, Taping, Dry Needling, DME instructions, Cryotherapy, and Moist heat   PLAN FOR NEXT SESSION: Consider trigger point release to upper trap.  Consider manual therapy to improve passive external rotation.  Progress exercises as tolerated.   Dessie Coma, PT 08/20/2023, 3:37 PM

## 2023-08-22 ENCOUNTER — Other Ambulatory Visit: Payer: Self-pay

## 2023-08-27 ENCOUNTER — Ambulatory Visit (HOSPITAL_BASED_OUTPATIENT_CLINIC_OR_DEPARTMENT_OTHER): Payer: Medicare HMO | Admitting: Physical Therapy

## 2023-08-27 ENCOUNTER — Encounter (HOSPITAL_BASED_OUTPATIENT_CLINIC_OR_DEPARTMENT_OTHER): Payer: Self-pay | Admitting: Physical Therapy

## 2023-08-27 DIAGNOSIS — G8929 Other chronic pain: Secondary | ICD-10-CM | POA: Diagnosis not present

## 2023-08-27 DIAGNOSIS — M25611 Stiffness of right shoulder, not elsewhere classified: Secondary | ICD-10-CM | POA: Diagnosis not present

## 2023-08-27 DIAGNOSIS — M25511 Pain in right shoulder: Secondary | ICD-10-CM | POA: Diagnosis not present

## 2023-08-27 NOTE — Therapy (Signed)
OUTPATIENT PHYSICAL THERAPY UPPER EXTREMITY EVALUATION   Patient Name: Samuel Porter MRN: [id number] DOB:[birth year], 82 y.o., male Today's Date: 08/27/2023  END OF SESSION:  PT End of Session - 08/27/23 0932     Visit Number 3    Number of Visits 16    Date for PT Re-Evaluation 10/11/23    PT Start Time 0930    PT Stop Time 1012    PT Time Calculation (min) 42 min    Activity Tolerance Patient tolerated treatment well    Behavior During Therapy WFL for tasks assessed/performed             Past Medical History:  Diagnosis Date   Arthritis    oa   BPH (benign prostatic hyperplasia)    Cancer (HCC) 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   Cataracts, bilateral    Glaucoma    Hypertension    Vitamin D deficiency    Past Surgical History:  Procedure Laterality Date   CHOLECYSTECTOMY     colonscopy  2017   JOINT REPLACEMENT Right    hip    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   Procedure: LEFT TOTAL HIP ARTHROPLASTY ANTERIOR APPROACH;  Surgeon: Ollen Gross, MD;  Location: [facility];  Service: Orthopedics;  Laterality: Left;   VENTRAL HERNIA REPAIR N/A 07/12/2023   Procedure: HERNIA REPAIR VENTRAL;  Surgeon: Quentin Ore, MD;  Location: [facility];  Service: General;  Laterality: N/A;   Patient Active Problem List   Diagnosis Date Noted   Incarcerated umbilical hernia 07/11/2023   Squamous cell carcinoma of skin 11/30/2022   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   OA (osteoarthritis) of hip 10/31/2016    PCP: Farris Has MD   REFERRING PROVIDER: Kennyth Arnold MD   REFERRING DIAG: Right Shoulder pain and stiffness   THERAPY DIAG:  Chronic right shoulder pain  Stiffness of right shoulder, not elsewhere classified  Rationale for Evaluation and Treatment: Rehabilitation  ONSET DATE:   SUBJECTIVE:                                                                                                                                                                                       SUBJECTIVE STATEMENT: Patient feels like the right shoulder may be a little bit better.  Exercise.  No significant soreness noted after last visit. Eval: In July 2024 the patient had a oral surgery and neck surgery to remove cancer.  He als also removed his lymph nodes.  He reports since that point he has had pain in his shoulder and neck.  He reports limited ability to lift his  shoulder.  He reports over the past 2 months he has had an improved ability to lift his shoulder but is still limited.  He reports the pain is down to a consistent 3 out of 10.  He is right-handed.  He has milder limitations with the left arm.   Hand dominance: Right  PERTINENT HISTORY: Hip OA; THA 2018  Cancer; bverntral hernia repair 07/12/2023  PAIN:  Are you having pain? Yes: NPRS scale: 3/10 Pain location: right shoulder  Pain description: aching  Aggravating factors: use of the arm, was bothersome at night  Relieving factors: not using the arm   PRECAUTIONS: None  RED FLAGS: None   WEIGHT BEARING RESTRICTIONS: No  FALLS:  Has patient fallen in last 6 months? 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  DIAGNOSTIC FINDINGS:  Nothing   PATIENT SURVEYS :  FOTO 53 baseline 68 expected    COGNITION: Overall cognitive status: Within functional limits for tasks assessed     SENSATION: Occasional numbness    POSTURE:   UPPER EXTREMITY ROM:   Active ROM Right eval Left eval  Shoulder flexion 140 140  Shoulder extension    Shoulder abduction    Shoulder adduction    Shoulder internal rotation Painful behind the back but full     Shoulder  external rotation Painful behind the head but full    Elbow flexion    Elbow extension    Wrist flexion    Wrist extension    Wrist ulnar deviation    Wrist radial deviation    Wrist pronation    Wrist supination    (Blank rows = not tested)  Passive right external rotation 35 degrees  UPPER EXTREMITY MMT:  MMT Right eval Left eval  Shoulder flexion 8.6 13.8  Shoulder extension    Shoulder abduction    Shoulder adduction    Shoulder internal rotation 9.6 8.1  Shoulder external rotation 9.2 7.7  Middle trapezius    Lower trapezius    Elbow flexion    Elbow extension    Wrist flexion    Wrist extension    Wrist ulnar deviation    Wrist radial deviation    Wrist pronation    Wrist supination    Grip strength (lbs)    (Blank rows = not tested)    PALPATION:  Tender to palpation in the right upper trap.  Tightness in anterior shoulder.   TODAY'S TREATMENT:  DATE:  11/12 Manual: trigger point release to upper trap and PEc; active release to pec; Posterior glides grade II and III   Bilateral ER 3x10 1lb  Wand flexion #2 lbs 3x10  Supine ABC 1 pound each side  Row 2x15 red  Shoulder extension 2x15 red    11/5  Manual: trigger point release to upper trap and PEc; active release to pec; Posterior glides grade II and III   Bilateral ER 3x10  Wand flexion 1 lbs 3x10  Supine flexion 1lb 3x10  D2 flexion 2x10   Row 2x15 red  Shoulder extension 2x15 red    Eval: Access Code: [email or web address] URL: [email or web address] Date: 08/16/2023 Prepared by: Lorayne Bender  Exercises - Supine Shoulder Flexion Extension AAROM with Dowel  - 1 x daily - 7 x weekly - 3 sets - 10 reps - Shoulder extension with resistance - Neutral  - 1 x daily - 7 x weekly - 3 sets - 10 reps - Scapular Retraction with Resistance  - 1 x daily - 7 x  weekly - 3 sets - 10 reps  Manual: Trigger point release to upper trap  PATIENT EDUCATION: Education details: HEP,  Person educated: Patient Education method: Explanation, Demonstration, Tactile cues, Verbal cues, and Handouts Education comprehension: verbalized understanding, returned demonstration, verbal cues required, tactile cues required, and needs further education  HOME EXERCISE PROGRAM: Access Code: [facility] URL: [email or web address] Date: 08/16/2023 Prepared by: Lorayne Bender  Exercises - Supine Shoulder Flexion Extension AAROM with Dowel  - 1 x daily - 7 x weekly - 3 sets - 10 reps - Shoulder extension with resistance - Neutral  - 1 x daily - 7 x weekly - 3 sets - 10 reps - Scapular Retraction with Resistance  - 1 x daily - 7 x weekly - 3 sets - 10 reps  ASSESSMENT:  CLINICAL IMPRESSION: Overall the patient is making progress.  He had no significant pain with exercises.  On the left side today.  He had improved pain on the left side with manual continues to have some tightness of the pec and upper trap on both shoulders.  Reviewed self soft tissue mobilization of bilateral upper traps.  Exercises at home    Eval: Patient is an 82 year old male who presents with decreased functional use of his right UE and pain with functional use of his right UE. He has limited active motion and strength. He has tightness in his upper trap and anterior shoulder. He reports he would frequently go to the gym and use the pool at the [facility] prior to his cancer surgery. He would beneift from skilled therapy on the land and water.   OBJECTIVE IMPAIRMENTS: decreased activity tolerance, decreased ROM, decreased strength, impaired UE functional use, and pain.   ACTIVITY LIMITATIONS: carrying, lifting, sleeping, and reach over head  PARTICIPATION LIMITATIONS: meal prep, cleaning, laundry, shopping, yard work, and going to the gym   PERSONAL FACTORS: 1-2 comorbidities: recent hernia  repair; Cancer   are also affecting patient's functional outcome.   REHAB POTENTIAL: Good  CLINICAL DECISION MAKING: Stable/uncomplicated  EVALUATION COMPLEXITY: Low  GOALS: Goals reviewed with patient? Yes  SHORT TERM GOALS: Target date: 09/13/2023    Patient will increase passive right shoulder external rotation by 20 degrees Baseline: Goal status: INITIAL  2.  Patient will get demonstrate 160 degrees of active right shoulder flexion without increased pain Baseline:  Goal status: INITIAL  3.  Patient will be independent with base exercise program Baseline:  Goal  status: INITIAL  4.  Patient will report a reduction in overall pain from 3 out of 10 to 1 out of 10 with daily activities Baseline:  Goal status: INITIAL   LONG TERM GOALS: Target date: 10/11/2023    Patient will perform all ADLs and IADLs without increase right shoulder pain Baseline:  Goal status: INITIAL  2.  Patient will sleep through the night without increased pain Baseline:  Goal status: INITIAL  3.  Patient will return to the [facility] with a program for water and land Baseline:  Goal status: INITIAL   PLAN: PT FREQUENCY: 2x/week  PT DURATION: 8 weeks  PLANNED INTERVENTIONS: 97110-Therapeutic exercises, 97530- Therapeutic activity, [id number]- Neuromuscular re-education, 97535- Self Care, [id number]- Manual therapy, [id number]- Gait training, [phone]- Aquatic Therapy, 97014- Electrical stimulation (unattended), 97035- Ultrasound, Patient/Family education, Stair training, Taping, Dry Needling, DME instructions, Cryotherapy, and Moist heat   PLAN FOR NEXT SESSION: Consider trigger point release to upper trap.  Consider manual therapy to improve passive external rotation.  Progress exercises as tolerated.   Dessie Coma, PT 08/27/2023, 9:59 AM

## 2023-09-06 ENCOUNTER — Encounter (HOSPITAL_BASED_OUTPATIENT_CLINIC_OR_DEPARTMENT_OTHER): Payer: Self-pay | Admitting: Physical Therapy

## 2023-09-06 ENCOUNTER — Ambulatory Visit (HOSPITAL_BASED_OUTPATIENT_CLINIC_OR_DEPARTMENT_OTHER): Payer: Medicare HMO | Admitting: Physical Therapy

## 2023-09-06 DIAGNOSIS — M25611 Stiffness of right shoulder, not elsewhere classified: Secondary | ICD-10-CM | POA: Diagnosis not present

## 2023-09-06 DIAGNOSIS — M25511 Pain in right shoulder: Secondary | ICD-10-CM | POA: Diagnosis not present

## 2023-09-06 DIAGNOSIS — G8929 Other chronic pain: Secondary | ICD-10-CM

## 2023-09-06 NOTE — Therapy (Signed)
OUTPATIENT PHYSICAL THERAPY UPPER EXTREMITY EVALUATION   Patient Name: Samuel Porter MRN: [id number] DOB:[birth year], 82 y.o., male Today's Date: 09/06/2023  END OF SESSION:  PT End of Session - 09/06/23 0930     Visit Number 4    Number of Visits 16    Date for PT Re-Evaluation 10/11/23    PT Start Time 0845    PT Stop Time 0928    PT Time Calculation (min) 43 min    Activity Tolerance Patient tolerated treatment well    Behavior During Therapy [facility] for tasks assessed/performed              Past Medical History:  Diagnosis Date   Arthritis    oa   BPH (benign prostatic hyperplasia)    Cancer (HCC) 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   Cataracts, bilateral    Glaucoma    Hypertension    Vitamin D deficiency    Past Surgical History:  Procedure Laterality Date   CHOLECYSTECTOMY     colonscopy  2017   JOINT REPLACEMENT Right    hip    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   Procedure: LEFT TOTAL HIP ARTHROPLASTY ANTERIOR APPROACH;  Surgeon: Ollen Gross, MD;  Location: [facility];  Service: Orthopedics;  Laterality: Left;   VENTRAL HERNIA REPAIR N/A 07/12/2023   Procedure: HERNIA REPAIR VENTRAL;  Surgeon: Quentin Ore, MD;  Location: [facility];  Service: General;  Laterality: N/A;   Patient Active Problem List   Diagnosis Date Noted   Incarcerated umbilical hernia 07/11/2023   Squamous cell carcinoma of skin 11/30/2022   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  Rationale for Evaluation and Treatment: Rehabilitation  ONSET DATE:   SUBJECTIVE:                                                                                                                                                                                       SUBJECTIVE STATEMENT: The patient reports he has been doing pretty well.     Eval: In July 2024 the patient had a oral surgery and neck surgery to remove cancer.  He als also removed his lymph nodes.  He reports since that point he has had pain in his shoulder and neck.  He reports limited ability to lift his shoulder.  He reports over the past 2 months he has had an improved ability to lift  his shoulder but is still limited.  He reports the pain is down to a consistent 3 out of 10.  He is right-handed.  He has milder limitations with the left arm.   Hand dominance: Right  PERTINENT HISTORY: Hip OA; THA 2018  Cancer; bverntral hernia repair 07/12/2023  PAIN:  Are you having pain? Yes: NPRS scale: 3/10 Pain location: right shoulder  Pain description: aching  Aggravating factors: use of the arm, was bothersome at night  Relieving factors: not using the arm   PRECAUTIONS: None  RED FLAGS: None   WEIGHT BEARING RESTRICTIONS: No  FALLS:  Has patient fallen in last 6 months? 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  DIAGNOSTIC FINDINGS:  Nothing   PATIENT SURVEYS :  FOTO 53 baseline 68 expected    COGNITION: Overall cognitive status: Within functional limits for tasks assessed     SENSATION: Occasional numbness    POSTURE:   UPPER EXTREMITY ROM:   Active ROM Right eval Left eval  Shoulder flexion 140 140  Shoulder extension    Shoulder abduction    Shoulder adduction    Shoulder internal rotation Painful behind the back but full     Shoulder external rotation Painful behind the head but full    Elbow flexion    Elbow extension    Wrist flexion    Wrist extension     Wrist ulnar deviation    Wrist radial deviation    Wrist pronation    Wrist supination    (Blank rows = not tested)  Passive right external rotation 35 degrees  UPPER EXTREMITY MMT:  MMT Right eval Left eval  Shoulder flexion 8.6 13.8  Shoulder extension    Shoulder abduction    Shoulder adduction    Shoulder internal rotation 9.6 8.1  Shoulder external rotation 9.2 7.7  Middle trapezius    Lower trapezius    Elbow flexion    Elbow extension    Wrist flexion    Wrist extension    Wrist ulnar deviation    Wrist radial deviation    Wrist pronation    Wrist supination    Grip strength (lbs)    (Blank rows = not tested)    PALPATION:  Tender to palpation in the right upper trap.  Tightness in anterior shoulder.   TODAY'S TREATMENT:                                                                                                                                         DATE:  11/22  Manual: trigger point release to upper trap  and PEc; active release to pec; Posterior glides grade II and III   Bilateral ER 3x10 1lb  Wand flexion #2 lbs 3x10  Supine ABC 1 pound each side  Standing shoulder flexion 2x10  Standing shoulder scaption 2x10      11/12 Manual: trigger point release to upper trap and PEc; active release to pec; Posterior glides grade II and III   Bilateral ER 3x10 1lb  Wand flexion #2 lbs 3x10  Supine ABC 1 pound each side  Row 2x15 red  Shoulder extension 2x15 red    11/5  Manual: trigger point release to upper trap and PEc; active release to pec; Posterior glides grade II and III   Bilateral ER 3x10  Wand flexion 1 lbs 3x10  Supine flexion 1lb 3x10  D2 flexion 2x10   Row 2x15 red  Shoulder extension 2x15 red    Eval: Access Code: [email or web address] URL: [email or web address] Date: 08/16/2023 Prepared by: Lorayne Bender  Exercises - Supine Shoulder Flexion Extension AAROM with Dowel  - 1 x daily - 7 x weekly - 3 sets - 10  reps - Shoulder extension with resistance - Neutral  - 1 x daily - 7 x weekly - 3 sets - 10 reps - Scapular Retraction with Resistance  - 1 x daily - 7 x weekly - 3 sets - 10 reps  Manual: Trigger point release to upper trap  PATIENT EDUCATION: Education details: HEP,  Person educated: Patient Education method: Explanation, Demonstration, Tactile cues, Verbal cues, and Handouts Education comprehension: verbalized understanding, returned demonstration, verbal cues required, tactile cues required, and needs further education  HOME EXERCISE PROGRAM: Access Code: [facility] URL: [email or web address] Date: 08/16/2023 Prepared by: Lorayne Bender  Exercises - Supine Shoulder Flexion Extension AAROM with Dowel  - 1 x daily - 7 x weekly - 3 sets - 10 reps - Shoulder extension with resistance - Neutral  - 1 x daily - 7 x weekly - 3 sets - 10 reps - Scapular Retraction with Resistance  - 1 x daily - 7 x weekly - 3 sets - 10 reps  ASSESSMENT:  CLINICAL IMPRESSION: The patient continues to make progress. We were able to advance his exercises today despite baseline soreness in his left shoulder 2nd to a shot. We advanced his cane weight and ER weight. We also added in standing shoulder flexion. He was given an updated HEP.. Therapy will continue to advance as tolerated. See below for goal specific progress     Eval: Patient is an 82 year old male who presents with decreased functional use of his right UE and pain with functional use of his right UE. He has limited active motion and strength. He has tightness in his upper trap and anterior shoulder. He reports he would frequently go to the gym and use the pool at the [facility] prior to his cancer surgery. He would beneift from skilled therapy on the land and water.   OBJECTIVE IMPAIRMENTS: decreased activity tolerance, decreased ROM, decreased strength, impaired UE functional use, and pain.   ACTIVITY LIMITATIONS: carrying, lifting,  sleeping, and reach over head  PARTICIPATION LIMITATIONS: meal prep, cleaning, laundry, shopping, yard work, and going to the gym   PERSONAL FACTORS: 1-2 comorbidities: recent hernia repair; Cancer   are also affecting patient's functional outcome.   REHAB POTENTIAL: Good  CLINICAL DECISION MAKING: Stable/uncomplicated  EVALUATION COMPLEXITY: Low  GOALS: Goals reviewed with patient? Yes  SHORT TERM GOALS: Target date: 09/13/2023    Patient will increase passive right shoulder  external rotation by 20 degrees Baseline: Goal status: nearing fullrange achieved 11/22  2.  Patient will get demonstrate 160 degrees of active right shoulder flexion without increased pain Baseline:  Goal status: added into HEP today 11/22  3.  Patient will be independent with base exercise program Baseline:  Goal status: INITIAL  4.  Patient will report a reduction in overall pain from 3 out of 10 to 1 out of 10 with daily activities Baseline:  Goal status: INITIAL   LONG TERM GOALS: Target date: 10/11/2023    Patient will perform all ADLs and IADLs without increase right shoulder pain Baseline:  Goal status: INITIAL  2.  Patient will sleep through the night without increased pain Baseline:  Goal status: INITIAL  3.  Patient will return to the [facility] with a program for water and land Baseline:  Goal status: INITIAL   PLAN: PT FREQUENCY: 2x/week  PT DURATION: 8 weeks  PLANNED INTERVENTIONS: 97110-Therapeutic exercises, 97530- Therapeutic activity, [id number]- Neuromuscular re-education, 97535- Self Care, [id number]- Manual therapy, [id number]- Gait training, [phone]- Aquatic Therapy, 97014- Electrical stimulation (unattended), 97035- Ultrasound, Patient/Family education, Stair training, Taping, Dry Needling, DME instructions, Cryotherapy, and Moist heat   PLAN FOR NEXT SESSION: Consider trigger point release to upper trap.  Consider manual therapy to improve passive external rotation.  Progress  exercises as tolerated.   Dessie Coma, PT 09/06/2023, 9:47 AM

## 2023-09-17 ENCOUNTER — Ambulatory Visit (HOSPITAL_BASED_OUTPATIENT_CLINIC_OR_DEPARTMENT_OTHER): Payer: Medicare HMO | Admitting: Physical Therapy

## 2023-09-17 DIAGNOSIS — Z85828 Personal history of other malignant neoplasm of skin: Secondary | ICD-10-CM | POA: Diagnosis not present

## 2023-09-17 DIAGNOSIS — L821 Other seborrheic keratosis: Secondary | ICD-10-CM | POA: Diagnosis not present

## 2023-09-17 DIAGNOSIS — L308 Other specified dermatitis: Secondary | ICD-10-CM | POA: Diagnosis not present

## 2023-09-17 DIAGNOSIS — L814 Other melanin hyperpigmentation: Secondary | ICD-10-CM | POA: Diagnosis not present

## 2023-09-17 DIAGNOSIS — L270 Generalized skin eruption due to drugs and medicaments taken internally: Secondary | ICD-10-CM | POA: Diagnosis not present

## 2023-09-17 DIAGNOSIS — D0439 Carcinoma in situ of skin of other parts of face: Secondary | ICD-10-CM | POA: Diagnosis not present

## 2023-09-17 DIAGNOSIS — L570 Actinic keratosis: Secondary | ICD-10-CM | POA: Diagnosis not present

## 2023-09-19 ENCOUNTER — Ambulatory Visit (HOSPITAL_BASED_OUTPATIENT_CLINIC_OR_DEPARTMENT_OTHER): Payer: Medicare HMO | Attending: Otolaryngology | Admitting: Physical Therapy

## 2023-09-19 ENCOUNTER — Encounter (HOSPITAL_BASED_OUTPATIENT_CLINIC_OR_DEPARTMENT_OTHER): Payer: Self-pay | Admitting: Physical Therapy

## 2023-09-19 DIAGNOSIS — M25511 Pain in right shoulder: Secondary | ICD-10-CM | POA: Insufficient documentation

## 2023-09-19 DIAGNOSIS — M25611 Stiffness of right shoulder, not elsewhere classified: Secondary | ICD-10-CM | POA: Insufficient documentation

## 2023-09-19 DIAGNOSIS — G8929 Other chronic pain: Secondary | ICD-10-CM | POA: Insufficient documentation

## 2023-09-19 NOTE — Therapy (Signed)
OUTPATIENT PHYSICAL THERAPY UPPER EXTREMITY EVALUATION   Patient Name: Samuel Porter MRN: [id number] DOB:1941-02-28, 82 y.o., male Today's Date: 09/19/2023  END OF SESSION:  PT End of Session - 09/19/23 1438     Visit Number 5    Number of Visits 16    Date for PT Re-Evaluation 10/11/23    PT Start Time 1433    PT Stop Time 1515    PT Time Calculation (min) 42 min    Activity Tolerance Patient tolerated treatment well    Behavior During Therapy WFL for tasks assessed/performed              Past Medical History:  Diagnosis Date   Arthritis    oa   BPH (benign prostatic hyperplasia)    Cancer (HCC) 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   Cataracts, bilateral    Glaucoma    Hypertension    Vitamin D deficiency    Past Surgical History:  Procedure Laterality Date   CHOLECYSTECTOMY     colonscopy  2017   JOINT REPLACEMENT Right    hip    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   Procedure: LEFT TOTAL HIP ARTHROPLASTY ANTERIOR APPROACH;  Surgeon: Ollen Gross, MD;  Location: [facility];  Service: Orthopedics;  Laterality: Left;   VENTRAL HERNIA REPAIR N/A 07/12/2023   Procedure: HERNIA REPAIR VENTRAL;  Surgeon: Quentin Ore, MD;  Location: [facility];  Service: General;  Laterality: N/A;   Patient Active Problem List   Diagnosis Date Noted   Incarcerated umbilical hernia 07/11/2023   Squamous cell carcinoma of skin 11/30/2022   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   OA (osteoarthritis) of hip 10/31/2016    PCP: Farris Has MD   REFERRING PROVIDER: Kennyth Arnold MD   REFERRING DIAG: Right Shoulder pain and stiffness   THERAPY DIAG:  Chronic right shoulder pain  Stiffness of right shoulder, not elsewhere classified  Rationale for Evaluation and Treatment: Rehabilitation  ONSET DATE:   SUBJECTIVE:                                                                                                                                                                                       SUBJECTIVE STATEMENT: The patient reports he has been doing pretty well. His left shoulder is hurting more then his right.     Eval: In July 2024 the patient had a oral surgery and neck surgery to remove cancer.  He als also removed his lymph nodes.  He reports since that point he has had pain in his shoulder and neck.  He reports limited ability to lift  his shoulder.  He reports over the past 2 months he has had an improved ability to lift his shoulder but is still limited.  He reports the pain is down to a consistent 3 out of 10.  He is right-handed.  He has milder limitations with the left arm.   Hand dominance: Right  PERTINENT HISTORY: Hip OA; THA 2018  Cancer; bverntral hernia repair 07/12/2023  PAIN:  Are you having pain? Yes: NPRS scale: 3/10 Pain location: right shoulder  Pain description: aching  Aggravating factors: use of the arm, was bothersome at night  Relieving factors: not using the arm   PRECAUTIONS: None  RED FLAGS: None   WEIGHT BEARING RESTRICTIONS: No  FALLS:  Has patient fallen in last 6 months? 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  DIAGNOSTIC FINDINGS:  Nothing   PATIENT SURVEYS :  FOTO 53 baseline 68 expected    COGNITION: Overall cognitive status: Within functional limits for tasks assessed     SENSATION: Occasional numbness    POSTURE:   UPPER EXTREMITY ROM:   Active ROM Right eval Left eval  Shoulder flexion 140 140  Shoulder extension    Shoulder abduction    Shoulder adduction    Shoulder internal rotation Painful behind the back but full     Shoulder external rotation  Painful behind the head but full    Elbow flexion    Elbow extension    Wrist flexion    Wrist extension    Wrist ulnar deviation    Wrist radial deviation    Wrist pronation    Wrist supination    (Blank rows = not tested)  Passive right external rotation 35 degrees  UPPER EXTREMITY MMT:  MMT Right eval Left eval  Shoulder flexion 8.6 13.8  Shoulder extension    Shoulder abduction    Shoulder adduction    Shoulder internal rotation 9.6 8.1  Shoulder external rotation 9.2 7.7  Middle trapezius    Lower trapezius    Elbow flexion    Elbow extension    Wrist flexion    Wrist extension    Wrist ulnar deviation    Wrist radial deviation    Wrist pronation    Wrist supination    Grip strength (lbs)    (Blank rows = not tested)    PALPATION:  Tender to palpation in the right upper trap.  Tightness in anterior shoulder.   TODAY'S TREATMENT:  DATE:  12/5  Manual: trigger point release to upper trap and PEc; active release to pec; Posterior glides grade II and III   Bilateral ER 3x10 2lb  Wand flexion #23 lbs 3x10  Supine ABC 2 pound each side  Standing shoulder flexion 2x10 1lb Standing shoulder scaption 2x10 1lb  11/22  Manual: trigger point release to upper trap and PEc; active release to pec; Posterior glides grade II and III   Bilateral ER 3x10 1lb  Wand flexion #2 lbs 3x10  Supine ABC 1 pound each side  Standing shoulder flexion 2x10  Standing shoulder scaption 2x10      11/12 Manual: trigger point release to upper trap and PEc; active release to pec; Posterior glides grade II and III   Bilateral ER 3x10 1lb  Wand flexion #2 lbs 3x10  Supine ABC 1 pound each side  Row 2x15 red  Shoulder extension 2x15 red    11/5  Manual: trigger point release to upper trap and PEc; active release to pec; Posterior glides  grade II and III   Bilateral ER 3x10  Wand flexion 1 lbs 3x10  Supine flexion 1lb 3x10  D2 flexion 2x10   Row 2x15 red  Shoulder extension 2x15 red    Eval: Access Code: [email or web address] URL: [email or web address] Date: 08/16/2023 Prepared by: Lorayne Bender  Exercises - Supine Shoulder Flexion Extension AAROM with Dowel  - 1 x daily - 7 x weekly - 3 sets - 10 reps - Shoulder extension with resistance - Neutral  - 1 x daily - 7 x weekly - 3 sets - 10 reps - Scapular Retraction with Resistance  - 1 x daily - 7 x weekly - 3 sets - 10 reps  Manual: Trigger point release to upper trap  PATIENT EDUCATION: Education details: HEP,  Person educated: Patient Education method: Explanation, Demonstration, Tactile cues, Verbal cues, and Handouts Education comprehension: verbalized understanding, returned demonstration, verbal cues required, tactile cues required, and needs further education  HOME EXERCISE PROGRAM: Access Code: [facility] URL: [email or web address] Date: 08/16/2023 Prepared by: Lorayne Bender  Exercises - Supine Shoulder Flexion Extension AAROM with Dowel  - 1 x daily - 7 x weekly - 3 sets - 10 reps - Shoulder extension with resistance - Neutral  - 1 x daily - 7 x weekly - 3 sets - 10 reps - Scapular Retraction with Resistance  - 1 x daily - 7 x weekly - 3 sets - 10 reps  ASSESSMENT:  CLINICAL IMPRESSION: The patient reported the left continued to be more sore then the right. He tolerated there-ex well. We advanced his weight with his ABC exercises. We advanced his weights with all exercises today. His right is improving. His left is a little more sore.   Eval: Patient is an 82 year old male who presents with decreased functional use of his right UE and pain with functional use of his right UE. He has limited active motion and strength. He has tightness in his upper trap and anterior shoulder. He reports he would frequently go to the gym and use the  pool at the [facility] prior to his cancer surgery. He would beneift from skilled therapy on the land and water.   OBJECTIVE IMPAIRMENTS: decreased activity tolerance, decreased ROM, decreased strength, impaired UE functional use, and pain.   ACTIVITY LIMITATIONS: carrying, lifting, sleeping, and reach over head  PARTICIPATION LIMITATIONS: meal prep, cleaning, laundry, shopping, yard work, and going to the gym   PERSONAL FACTORS: 1-2 comorbidities: recent hernia repair;  Cancer   are also affecting patient's functional outcome.   REHAB POTENTIAL: Good  CLINICAL DECISION MAKING: Stable/uncomplicated  EVALUATION COMPLEXITY: Low  GOALS: Goals reviewed with patient? Yes  SHORT TERM GOALS: Target date: 09/13/2023    Patient will increase passive right shoulder external rotation by 20 degrees Baseline: Goal status: nearing fullrange achieved 11/22  2.  Patient will get demonstrate 160 degrees of active right shoulder flexion without increased pain Baseline:  Goal status: added into HEP today 11/22  3.  Patient will be independent with base exercise program Baseline:  Goal status: INITIAL  4.  Patient will report a reduction in overall pain from 3 out of 10 to 1 out of 10 with daily activities Baseline:  Goal status: INITIAL   LONG TERM GOALS: Target date: 10/11/2023    Patient will perform all ADLs and IADLs without increase right shoulder pain Baseline:  Goal status: INITIAL  2.  Patient will sleep through the night without increased pain Baseline:  Goal status: INITIAL  3.  Patient will return to the [facility] with a program for water and land Baseline:  Goal status: INITIAL   PLAN: PT FREQUENCY: 2x/week  PT DURATION: 8 weeks  PLANNED INTERVENTIONS: 97110-Therapeutic exercises, 97530- Therapeutic activity, [id number]- Neuromuscular re-education, 97535- Self Care, 66440- Manual therapy, [id number]- Gait training, [id number]- Aquatic Therapy, 97014- Electrical stimulation (unattended),  97035- Ultrasound, Patient/Family education, Stair training, Taping, Dry Needling, DME instructions, Cryotherapy, and Moist heat   PLAN FOR NEXT SESSION: Consider trigger point release to upper trap.  Consider manual therapy to improve passive external rotation.  Progress exercises as tolerated.   Dessie Coma, PT 09/19/2023, 2:53 PM

## 2023-09-20 ENCOUNTER — Encounter (HOSPITAL_BASED_OUTPATIENT_CLINIC_OR_DEPARTMENT_OTHER): Payer: Medicare HMO

## 2023-09-20 ENCOUNTER — Encounter (HOSPITAL_BASED_OUTPATIENT_CLINIC_OR_DEPARTMENT_OTHER): Payer: Self-pay | Admitting: Physical Therapy

## 2023-09-20 DIAGNOSIS — H353131 Nonexudative age-related macular degeneration, bilateral, early dry stage: Secondary | ICD-10-CM | POA: Diagnosis not present

## 2023-09-20 DIAGNOSIS — H401111 Primary open-angle glaucoma, right eye, mild stage: Secondary | ICD-10-CM | POA: Diagnosis not present

## 2023-09-20 DIAGNOSIS — H401132 Primary open-angle glaucoma, bilateral, moderate stage: Secondary | ICD-10-CM | POA: Diagnosis not present

## 2023-09-20 DIAGNOSIS — H35352 Cystoid macular degeneration, left eye: Secondary | ICD-10-CM | POA: Diagnosis not present

## 2023-09-24 ENCOUNTER — Encounter (HOSPITAL_BASED_OUTPATIENT_CLINIC_OR_DEPARTMENT_OTHER): Payer: Self-pay | Admitting: Physical Therapy

## 2023-09-24 ENCOUNTER — Ambulatory Visit (HOSPITAL_BASED_OUTPATIENT_CLINIC_OR_DEPARTMENT_OTHER): Payer: Medicare HMO | Admitting: Physical Therapy

## 2023-09-24 DIAGNOSIS — G8929 Other chronic pain: Secondary | ICD-10-CM | POA: Diagnosis not present

## 2023-09-24 DIAGNOSIS — M25611 Stiffness of right shoulder, not elsewhere classified: Secondary | ICD-10-CM | POA: Diagnosis not present

## 2023-09-24 DIAGNOSIS — M25511 Pain in right shoulder: Secondary | ICD-10-CM | POA: Diagnosis not present

## 2023-09-24 NOTE — Therapy (Signed)
OUTPATIENT PHYSICAL THERAPY UPPER EXTREMITY EVALUATION   Patient Name: Samuel Porter MRN: [id number] DOB:01-Oct-1941, 82 y.o., male Today's Date: 09/24/2023  END OF SESSION:  PT End of Session - 09/24/23 0849     Visit Number 6    Number of Visits 16    Date for PT Re-Evaluation 10/11/23    PT Start Time 0845    PT Stop Time 0928    PT Time Calculation (min) 43 min    Activity Tolerance Patient tolerated treatment well    Behavior During Therapy [employer] for tasks assessed/performed              Past Medical History:  Diagnosis Date   Arthritis    oa   BPH (benign prostatic hyperplasia)    Cancer (HCC) 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   Cataracts, bilateral    Glaucoma    Hypertension    Vitamin D deficiency    Past Surgical History:  Procedure Laterality Date   CHOLECYSTECTOMY     colonscopy  2017   JOINT REPLACEMENT Right    hip    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   Procedure: LEFT TOTAL HIP ARTHROPLASTY ANTERIOR APPROACH;  Surgeon: Ollen Gross, MD;  Location: [facility];  Service: Orthopedics;  Laterality: Left;   VENTRAL HERNIA REPAIR N/A 07/12/2023   Procedure: HERNIA REPAIR VENTRAL;  Surgeon: Quentin Ore, MD;  Location: [facility];  Service: General;  Laterality: N/A;   Patient Active Problem List   Diagnosis Date Noted   Incarcerated umbilical hernia 07/11/2023   Squamous cell carcinoma of skin 11/30/2022   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 He has been working on his exercises.     Eval: In July 2024 the patient had a oral surgery and neck surgery to remove cancer.  He als also removed his lymph nodes.  He reports since that point he has had pain in his shoulder and neck.  He reports limited ability to lift his shoulder.  He reports  over the past 2 months he has had an improved ability to lift his shoulder but is still limited.  He reports the pain is down to a consistent 3 out of 10.  He is right-handed.  He has milder limitations with the left arm.   Hand dominance: Right  PERTINENT HISTORY: Hip OA; THA 2018  Cancer; bverntral hernia repair 07/12/2023  PAIN:  Are you having pain? Yes: NPRS scale: 3/10 Pain location: right shoulder  Pain description: aching  Aggravating factors: use of the arm, was bothersome at night  Relieving factors: not using the arm   PRECAUTIONS: None  RED FLAGS: None   WEIGHT BEARING RESTRICTIONS: No  FALLS:  Has patient fallen in last 6 months? 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  DIAGNOSTIC FINDINGS:  Nothing   PATIENT SURVEYS :  FOTO 53 baseline 68 expected    COGNITION: Overall cognitive status: Within functional limits for tasks assessed     SENSATION: Occasional numbness    POSTURE:   UPPER EXTREMITY ROM:   Active ROM Right eval Left eval  Shoulder flexion 140 140  Shoulder extension    Shoulder abduction    Shoulder adduction    Shoulder internal rotation Painful behind the back but full     Shoulder external rotation Painful behind the head but  full    Elbow flexion    Elbow extension    Wrist flexion    Wrist extension    Wrist ulnar deviation    Wrist radial deviation    Wrist pronation    Wrist supination    (Blank rows = not tested)  Passive right external rotation 35 degrees  UPPER EXTREMITY MMT:  MMT Right eval Left eval  Shoulder flexion 8.6 13.8  Shoulder extension    Shoulder abduction    Shoulder adduction    Shoulder internal rotation 9.6 8.1  Shoulder external rotation 9.2 7.7  Middle trapezius    Lower trapezius    Elbow flexion    Elbow extension    Wrist flexion    Wrist extension    Wrist ulnar deviation    Wrist radial deviation    Wrist pronation    Wrist supination    Grip strength (lbs)    (Blank rows = not tested)    PALPATION:  Tender to palpation in the right upper trap.  Tightness in anterior shoulder.   TODAY'S TREATMENT:  DATE:  12/10 Manual: trigger point release to upper trap and PEc; active release to pec; Posterior glides grade II and III   Bilateral ER 3x10 3lb  Wand flexion #3 lbs 3x10  Supine ABC  3 pound each side  Standing shoulder flexion 2x10 1lb Standing shoulder scaption 2x10 1lb  UBE 2 min fwd 2 min reverse     12/5  Manual: trigger point release to upper trap and PEc; active release to pec; Posterior glides grade II and III   Bilateral ER 3x10 2lb  Wand flexion #23 lbs 3x10  Supine ABC 2 pound each side  Standing shoulder flexion 2x10 1lb Standing shoulder scaption 2x10 1lb  11/22  Manual: trigger point release to upper trap and PEc; active release to pec; Posterior glides grade II and III   Bilateral ER 3x10 1lb  Wand flexion #2 lbs 3x10  Supine ABC 1 pound each side  Standing shoulder flexion 2x10  Standing shoulder scaption 2x10      11/12 Manual: trigger point release to upper trap and PEc; active  release to pec; Posterior glides grade II and III   Bilateral ER 3x10 1lb  Wand flexion #2 lbs 3x10  Supine ABC 1 pound each side  Row 2x15 red  Shoulder extension 2x15 red    11/5  Manual: trigger point release to upper trap and PEc; active release to pec; Posterior glides grade II and III   Bilateral ER 3x10  Wand flexion 1 lbs 3x10  Supine flexion 1lb 3x10  D2 flexion 2x10   Row 2x15 red  Shoulder extension 2x15 red    Eval: Access Code: [email or web address] URL: [email or web address] Date: 08/16/2023 Prepared by: Lorayne Bender  Exercises - Supine Shoulder Flexion Extension AAROM with Dowel  - 1 x daily - 7 x weekly - 3 sets - 10 reps - Shoulder extension with resistance - Neutral  - 1 x daily - 7 x weekly - 3 sets - 10 reps - Scapular Retraction with Resistance  - 1 x daily - 7 x weekly - 3 sets - 10 reps  Manual: Trigger point release to upper trap  PATIENT EDUCATION: Education details: HEP,  Person educated: Patient Education method: Explanation, Demonstration, Tactile cues, Verbal cues, and Handouts Education comprehension: verbalized understanding, returned demonstration, verbal cues required, tactile cues required, and needs further education  HOME EXERCISE PROGRAM: Access Code: [facility] URL: [email or web address] Date: 08/16/2023 Prepared by: Lorayne Bender  Exercises - Supine Shoulder Flexion Extension AAROM with Dowel  - 1 x daily - 7 x weekly - 3 sets - 10 reps - Shoulder extension with resistance - Neutral  - 1 x daily - 7 x weekly - 3 sets - 10 reps - Scapular Retraction with Resistance  - 1 x daily - 7 x weekly - 3 sets - 10 reps  ASSESSMENT:  CLINICAL IMPRESSION: Therapy continues to advance the patients weights. Overall he is progressing very well. He is having minial pain. His FOTO score passed his goal today. He has full passvie ER and flexion bilateral. We will likley D/C to EP bext visit.  Eval: Patient is an 82 year old  male who presents with decreased functional use of his right UE and pain with functional use of his right UE. He has limited active motion and strength. He has tightness in his upper trap and anterior shoulder. He reports he would frequently go to the gym and use the pool at the [facility] prior to his cancer surgery. He would beneift from  skilled therapy on the land and water.   OBJECTIVE IMPAIRMENTS: decreased activity tolerance, decreased ROM, decreased strength, impaired UE functional use, and pain.   ACTIVITY LIMITATIONS: carrying, lifting, sleeping, and reach over head  PARTICIPATION LIMITATIONS: meal prep, cleaning, laundry, shopping, yard work, and going to the gym   PERSONAL FACTORS: 1-2 comorbidities: recent hernia repair; Cancer   are also affecting patient's functional outcome.   REHAB POTENTIAL: Good  CLINICAL DECISION MAKING: Stable/uncomplicated  EVALUATION COMPLEXITY: Low  GOALS: Goals reviewed with patient? Yes  SHORT TERM GOALS: Target date: 09/13/2023    Patient will increase passive right shoulder external rotation by 20 degrees Baseline: Goal status: nearing fullrange achieved 11/22  2.  Patient will get demonstrate 160 degrees of active right shoulder flexion without increased pain Baseline:  Goal status: added into HEP today 11/22  3.  Patient will be independent with base exercise program Baseline:  Goal status: achived 12/10   4.  Patient will report a reduction in overall pain from 3 out of 10 to 1 out of 10 with daily activities Baseline:  Goal status: 12/10   LONG TERM GOALS: Target date: 10/11/2023    Patient will perform all ADLs and IADLs without increase right shoulder pain Baseline:  Goal status: INITIAL  2.  Patient will sleep through the night without increased pain Baseline:  Goal status: INITIAL  3.  Patient will return to the [facility] with a program for water and land Baseline:  Goal status: INITIAL   PLAN: PT FREQUENCY:  2x/week  PT DURATION: 8 weeks  PLANNED INTERVENTIONS: 97110-Therapeutic exercises, 97530- Therapeutic activity, [id number]- Neuromuscular re-education, 97535- Self Care, [id number]- Manual therapy, [id number]- Gait training, [phone]- Aquatic Therapy, 97014- Electrical stimulation (unattended), 97035- Ultrasound, Patient/Family education, Stair training, Taping, Dry Needling, DME instructions, Cryotherapy, and Moist heat   PLAN FOR NEXT SESSION: Consider trigger point release to upper trap.  Consider manual therapy to improve passive external rotation.  Progress exercises as tolerated.   Dessie Coma, PT 09/24/2023, 9:25 AM

## 2023-09-25 DIAGNOSIS — H401123 Primary open-angle glaucoma, left eye, severe stage: Secondary | ICD-10-CM | POA: Diagnosis not present

## 2023-09-27 ENCOUNTER — Ambulatory Visit (HOSPITAL_BASED_OUTPATIENT_CLINIC_OR_DEPARTMENT_OTHER): Payer: Medicare HMO | Admitting: Physical Therapy

## 2023-09-27 ENCOUNTER — Encounter (HOSPITAL_BASED_OUTPATIENT_CLINIC_OR_DEPARTMENT_OTHER): Payer: Self-pay | Admitting: Physical Therapy

## 2023-09-27 DIAGNOSIS — G8929 Other chronic pain: Secondary | ICD-10-CM

## 2023-09-27 DIAGNOSIS — M25511 Pain in right shoulder: Secondary | ICD-10-CM | POA: Diagnosis not present

## 2023-09-27 DIAGNOSIS — M25611 Stiffness of right shoulder, not elsewhere classified: Secondary | ICD-10-CM

## 2023-09-27 NOTE — Therapy (Signed)
OUTPATIENT PHYSICAL THERAPY UPPER EXTREMITY EVALUATION   Patient Name: Samuel Porter MRN: [id number] DOB:06-18-1941, 82 y.o., male Today's Date: 09/27/2023  END OF SESSION:  PT End of Session - 09/27/23 0938     Visit Number 7    Number of Visits 16    Date for PT Re-Evaluation 10/11/23    PT Start Time 0930    PT Stop Time 1012    PT Time Calculation (min) 42 min    Activity Tolerance Patient tolerated treatment well    Behavior During Therapy WFL for tasks assessed/performed              Past Medical History:  Diagnosis Date   Arthritis    oa   BPH (benign prostatic hyperplasia)    Cancer (HCC) 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   Cataracts, bilateral    Glaucoma    Hypertension    Vitamin D deficiency    Past Surgical History:  Procedure Laterality Date   CHOLECYSTECTOMY     colonscopy  2017   JOINT REPLACEMENT Right    hip    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   Procedure: LEFT TOTAL HIP ARTHROPLASTY ANTERIOR APPROACH;  Surgeon: Ollen Gross, MD;  Location: [facility];  Service: Orthopedics;  Laterality: Left;   VENTRAL HERNIA REPAIR N/A 07/12/2023   Procedure: HERNIA REPAIR VENTRAL;  Surgeon: Quentin Ore, MD;  Location: [facility];  Service: General;  Laterality: N/A;   Patient Active Problem List   Diagnosis Date Noted   Incarcerated umbilical hernia 07/11/2023   Squamous cell carcinoma of skin 11/30/2022   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 He has been working on his exercises.     Eval: In July 2024 the patient had a oral surgery and neck surgery to remove cancer.  He als also removed his lymph nodes.  He reports since that point he has had pain in his shoulder and neck.  He reports limited ability to lift his shoulder.  He reports  over the past 2 months he has had an improved ability to lift his shoulder but is still limited.  He reports the pain is down to a consistent 3 out of 10.  He is right-handed.  He has milder limitations with the left arm.   Hand dominance: Right  PERTINENT HISTORY: Hip OA; THA 2018  Cancer; bverntral hernia repair 07/12/2023  PAIN:  Are you having pain? Yes: NPRS scale: 3/10 Pain location: right shoulder  Pain description: aching  Aggravating factors: use of the arm, was bothersome at night  Relieving factors: not using the arm   PRECAUTIONS: None  RED FLAGS: None   WEIGHT BEARING RESTRICTIONS: No  FALLS:  Has patient fallen in last 6 months? 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  DIAGNOSTIC FINDINGS:  Nothing   PATIENT SURVEYS :  FOTO 53 baseline 68 expected    COGNITION: Overall cognitive status: Within functional limits for tasks assessed     SENSATION: Occasional numbness    POSTURE:   UPPER EXTREMITY ROM:   Active ROM Right eval Left eval  Shoulder flexion 140 140  Shoulder extension    Shoulder abduction    Shoulder adduction    Shoulder internal rotation Painful behind the back but full     Shoulder external rotation Painful behind the head but  full    Elbow flexion    Elbow extension    Wrist flexion    Wrist extension    Wrist ulnar deviation    Wrist radial deviation    Wrist pronation    Wrist supination    (Blank rows = not tested)  Passive right external rotation 35 degrees  UPPER EXTREMITY MMT:  MMT Right eval Left eval  Shoulder flexion 8.6 13.8  Shoulder extension    Shoulder abduction    Shoulder adduction    Shoulder internal rotation 9.6 8.1  Shoulder external rotation 9.2 7.7  Middle trapezius    Lower trapezius    Elbow flexion    Elbow extension    Wrist flexion    Wrist extension    Wrist ulnar deviation    Wrist radial deviation    Wrist pronation    Wrist supination    Grip strength (lbs)    (Blank rows = not tested)    PALPATION:  Tender to palpation in the right upper trap.  Tightness in anterior shoulder.   TODAY'S TREATMENT:  DATE:  12/13 Manual: trigger point release to upper trap and PEc; active release to pec; Posterior glides grade II and III   Bilateral ER 3x10 3lb  Wand flexion #3 lbs 3x10  Supine ABC  3 pound each side  Standing shoulder flexion 2x10 1lb Standing shoulder scaption 2x10 1lb  UBE 2 min fwd 2 min reversel   Scap retraction blue 2 x 15 Shoulder extension blue 2 x 15  12/10 Manual: trigger point release to upper trap and PEc; active release to pec; Posterior glides grade II and III   Bilateral ER 3x10 3lb  Wand flexion #3 lbs 3x10  Supine ABC  3 pound each side  Standing shoulder flexion 2x10 1lb Standing shoulder scaption 2x10 1lb  UBE 2 min fwd 2 min reversel     12/5  Manual: trigger point release to upper trap and PEc; active release to pec; Posterior glides grade II and III   Bilateral ER 3x10 2lb  Wand flexion #23 lbs 3x10  Supine ABC 2 pound each side  Standing shoulder flexion 2x10 1lb Standing  shoulder scaption 2x10 1lb  11/22  Manual: trigger point release to upper trap and PEc; active release to pec; Posterior glides grade II and III   Bilateral ER 3x10 1lb  Wand flexion #2 lbs 3x10  Supine ABC 1 pound each side  Standing shoulder flexion 2x10  Standing shoulder scaption 2x10      11/12 Manual: trigger point release to upper trap and PEc; active release to pec; Posterior glides grade II and III   Bilateral ER 3x10 1lb  Wand flexion #2 lbs 3x10  Supine ABC 1 pound each side  Row 2x15 red  Shoulder extension 2x15 red    11/5  Manual: trigger point release to upper trap and PEc; active release to pec; Posterior glides grade II and III   Bilateral ER 3x10  Wand flexion 1 lbs 3x10  Supine flexion 1lb 3x10  D2 flexion 2x10   Row 2x15 red  Shoulder extension 2x15 red    Eval: Access Code: [email or web address] URL: [email or web address] Date: 08/16/2023 Prepared by: Lorayne Bender  Exercises - Supine Shoulder Flexion Extension AAROM with Dowel  - 1 x daily - 7 x weekly - 3 sets - 10 reps - Shoulder extension with resistance - Neutral  - 1 x daily - 7 x weekly - 3 sets - 10 reps - Scapular Retraction with Resistance  - 1 x daily - 7 x weekly - 3 sets - 10 reps  Manual: Trigger point release to upper trap  PATIENT EDUCATION: Education details: HEP,  Person educated: Patient Education method: Explanation, Demonstration, Tactile cues, Verbal cues, and Handouts Education comprehension: verbalized understanding, returned demonstration, verbal cues required, tactile cues required, and needs further education  HOME EXERCISE PROGRAM: Access Code: [employer] URL: [email or web address] Date: 08/16/2023 Prepared by: Lorayne Bender  Exercises - Supine Shoulder Flexion Extension AAROM with Dowel  - 1 x daily - 7 x weekly - 3 sets - 10 reps - Shoulder extension with resistance - Neutral  - 1 x daily - 7 x weekly - 3 sets - 10 reps - Scapular  Retraction with Resistance  - 1 x daily - 7 x weekly - 3 sets - 10 reps  ASSESSMENT:  CLINICAL IMPRESSION: The patient has progressed very well. He is having been little pain. He has some stiffness at times. We have been able to advance his exercises consistently over the past few weeks.  He has a good  exercise program. He has bands to advance to. We also discussed progression to aquatic strengthening.  At this time we will likely discharge patient HEP.  Will keep his case open for 3 to 4 weeks in case he has any kind of acute exacerbation as he gets back into his full HEP and back into his aquatic program.  See below for goal specific progress.   visit.  Eval: Patient is an 82 year old male who presents with decreased functional use of his right UE and pain with functional use of his right UE. He has limited active motion and strength. He has tightness in his upper trap and anterior shoulder. He reports he would frequently go to the gym and use the pool at the [facility] prior to his cancer surgery. He would beneift from skilled therapy on the land and water.   OBJECTIVE IMPAIRMENTS: decreased activity tolerance, decreased ROM, decreased strength, impaired UE functional use, and pain.   ACTIVITY LIMITATIONS: carrying, lifting, sleeping, and reach over head  PARTICIPATION LIMITATIONS: meal prep, cleaning, laundry, shopping, yard work, and going to the gym   PERSONAL FACTORS: 1-2 comorbidities: recent hernia repair; Cancer   are also affecting patient's functional outcome.   REHAB POTENTIAL: Good  CLINICAL DECISION MAKING: Stable/uncomplicated  EVALUATION COMPLEXITY: Low  GOALS: Goals reviewed with patient? Yes  SHORT TERM GOALS: Target date: 09/13/2023    Patient will increase passive right shoulder external rotation by 20 degrees Baseline: Goal status: Full range achieved 12/13  2.  Patient will get demonstrate 160 degrees of active right shoulder flexion without increased  pain Baseline:  Goal status: Full active flexion achieved 12/13  3.  Patient will be independent with base exercise program Baseline:  Goal status: Has full program 12/13  4.  Patient will report a reduction in overall pain from 3 out of 10 to 1 out of 10 with daily activities Baseline:  Goal status: No significant pain achieved   LONG TERM GOALS: Target date: 10/11/2023    Patient will perform all ADLs and IADLs without increase right shoulder pain Baseline:  Goal status: Is able to perform all ADLs with minimal issue achieved 12/13  2.  Patient will sleep through the night without increased pain Baseline:  Goal status: Significant improvement in sleeping.  No pain at night noted at this time 12/13  3.  Patient will return to the [facility] with a program for water and land Baseline:  Goal status: Will return after the new year 12/13   PLAN: PT FREQUENCY: 2x/week  PT DURATION: 8 weeks  PLANNED INTERVENTIONS: 97110-Therapeutic exercises, 97530- Therapeutic activity, 97112- Neuromuscular re-education, 97535- Self Care, 13086- Manual therapy, [id number]- Gait training, [phone]- Aquatic Therapy, [id number]- Electrical stimulation (unattended), 97035- Ultrasound, Patient/Family education, Stair training, Taping, Dry Needling, DME instructions, Cryotherapy, and Moist heat   PLAN FOR NEXT SESSION: Consider trigger point release to upper trap.  Consider manual therapy to improve passive external rotation.  Progress exercises as tolerated.   Dessie Coma, PT 09/27/2023, 10:12 AM

## 2023-10-03 DIAGNOSIS — R972 Elevated prostate specific antigen [PSA]: Secondary | ICD-10-CM | POA: Diagnosis not present

## 2023-10-03 DIAGNOSIS — N5201 Erectile dysfunction due to arterial insufficiency: Secondary | ICD-10-CM | POA: Diagnosis not present

## 2023-10-03 DIAGNOSIS — N400 Enlarged prostate without lower urinary tract symptoms: Secondary | ICD-10-CM | POA: Diagnosis not present

## 2023-10-04 ENCOUNTER — Other Ambulatory Visit: Payer: Self-pay

## 2023-10-23 DIAGNOSIS — H401111 Primary open-angle glaucoma, right eye, mild stage: Secondary | ICD-10-CM | POA: Diagnosis not present

## 2023-11-13 DIAGNOSIS — Z008 Encounter for other general examination: Secondary | ICD-10-CM | POA: Diagnosis not present

## 2023-11-22 DIAGNOSIS — I499 Cardiac arrhythmia, unspecified: Secondary | ICD-10-CM | POA: Diagnosis not present

## 2023-11-22 DIAGNOSIS — R062 Wheezing: Secondary | ICD-10-CM | POA: Diagnosis not present

## 2023-11-22 DIAGNOSIS — I10 Essential (primary) hypertension: Secondary | ICD-10-CM | POA: Diagnosis not present

## 2023-11-22 DIAGNOSIS — R001 Bradycardia, unspecified: Secondary | ICD-10-CM | POA: Diagnosis not present

## 2023-11-22 DIAGNOSIS — I4891 Unspecified atrial fibrillation: Secondary | ICD-10-CM | POA: Diagnosis not present

## 2023-11-28 ENCOUNTER — Other Ambulatory Visit: Payer: Self-pay

## 2023-11-28 ENCOUNTER — Other Ambulatory Visit (HOSPITAL_COMMUNITY): Payer: Self-pay | Admitting: Family Medicine

## 2023-11-28 DIAGNOSIS — I4891 Unspecified atrial fibrillation: Secondary | ICD-10-CM

## 2023-12-12 DIAGNOSIS — E559 Vitamin D deficiency, unspecified: Secondary | ICD-10-CM | POA: Diagnosis not present

## 2023-12-12 DIAGNOSIS — I4891 Unspecified atrial fibrillation: Secondary | ICD-10-CM | POA: Diagnosis not present

## 2023-12-19 ENCOUNTER — Encounter: Payer: Self-pay | Admitting: Cardiovascular Disease

## 2023-12-19 ENCOUNTER — Ambulatory Visit: Admitting: Cardiovascular Disease

## 2023-12-19 ENCOUNTER — Ambulatory Visit (HOSPITAL_COMMUNITY): Payer: Medicare HMO | Attending: Family Medicine

## 2023-12-19 VITALS — BP 146/68 | HR 113 | Resp 16 | Ht 73.0 in | Wt 160.0 lb

## 2023-12-19 DIAGNOSIS — Z01810 Encounter for preprocedural cardiovascular examination: Secondary | ICD-10-CM | POA: Insufficient documentation

## 2023-12-19 DIAGNOSIS — C4492 Squamous cell carcinoma of skin, unspecified: Secondary | ICD-10-CM | POA: Insufficient documentation

## 2023-12-19 DIAGNOSIS — I4819 Other persistent atrial fibrillation: Secondary | ICD-10-CM

## 2023-12-19 DIAGNOSIS — I4891 Unspecified atrial fibrillation: Secondary | ICD-10-CM

## 2023-12-19 LAB — ECHOCARDIOGRAM COMPLETE
Calc EF: 45.7 %
S' Lateral: 3.18 cm
Single Plane A2C EF: 44 %
Single Plane A4C EF: 46 %

## 2023-12-19 MED ORDER — CARVEDILOL 6.25 MG PO TABS: 6.2500 mg | ORAL_TABLET | Freq: Two times a day (BID) | ORAL | 3 refills | Status: DC

## 2023-12-19 MED ORDER — VALSARTAN 40 MG PO TABS: 40.0000 mg | ORAL_TABLET | Freq: Two times a day (BID) | ORAL | 3 refills | Status: DC

## 2023-12-19 NOTE — Progress Notes (Signed)
 Cardiology Office Note:    Date:  12/19/2023   ID:  Samuel Porter, DOB April 04, 1941, MRN [id number]  PCP:  Samuel Has, MD   [facility] Providers Cardiologist:  None     Referring MD: Samuel Has, MD   Chief Complaint  Patient presents with   Atrial Fibrillation    History of Present Illness:    Samuel Porter is a 83 y.o. male presenting for evaluation of atrial fibrillation.  The patient was diagnosed with atrial fibrillation of new onset on November 22, 2023.  He is referred by his primary care provider for an echocardiogram and presented today for the study.  The echo showed that he was in atrial fibrillation with RVR and he had moderate LV dysfunction.  He was added onto my schedule for evaluation of atrial fibrillation and heart failure.  The patient Porter a pertinent history of squamous cell cancer and he underwent right modified radical neck dissection in July 2024.  He Porter lost a lot of weight but seems to have stabilized over the last several months.  He feels like he is doing pretty well in that regard.  The patient is here alone today.  He states that he is actually feeling fairly well.  He Porter been started on apixaban and is tolerating this without bleeding problems.  He Porter not had any orthopnea, PND, or leg swelling.  He does have mild exertional dyspnea.  No chest pain or pressure.  No heart palpitations.  He does not know when he went into atrial fibrillation as he Porter no awareness of the rhythm.  Thyroid studies were checked and TSH was within normal limits.  When he was hospitalized in 2024 there was some question of whether he might be in atrial fibrillation versus sinus tachycardia.   Current Medications: Current Meds  Medication Sig   acetaminophen (TYLENOL) 500 MG tablet Take 2 tablets (1,000 mg total) by mouth every 8 (eight) hours as needed for mild pain.   albuterol (VENTOLIN HFA) 108 (90 Base) MCG/ACT inhaler Inhale 2 puffs into the lungs every 4  (four) hours as needed.   carvedilol (COREG) 6.25 MG tablet Take 1 tablet (6.25 mg total) by mouth 2 (two) times daily.   cholecalciferol (VITAMIN D) 1000 units tablet Take 1,000 Units by mouth as directed. Twice weekly   dorzolamide-timolol (COSOPT) 22.3-6.8 MG/ML ophthalmic solution Place 1 drop into both eyes 2 (two) times daily.   ELIQUIS 5 MG TABS tablet Take 5 mg by mouth 2 (two) times daily.   finasteride (PROSCAR) 5 MG tablet Take 5 mg by mouth daily.   latanoprost (XALATAN) 0.005 % ophthalmic solution Place 1 drop into both eyes at bedtime.   polyethylene glycol (MIRALAX / GLYCOLAX) 17 g packet Take 17 g by mouth 2 (two) times daily as needed.   sildenafil (VIAGRA) 25 MG tablet Take 25 mg by mouth as needed for erectile dysfunction.   sulfamethoxazole-trimethoprim (BACTRIM DS) 800-160 MG tablet Take 1 tablet by mouth 2 (two) times daily.   valsartan (DIOVAN) 40 MG tablet Take 1 tablet (40 mg total) by mouth 2 (two) times daily.   [DISCONTINUED] amLODipine (NORVASC) 5 MG tablet Take 5 mg by mouth daily.   [DISCONTINUED] aspirin EC 81 MG tablet Take 1 tablet (81 mg total) by mouth daily. Swallow whole.     Allergies:   Patient Porter no known allergies.   ROS:   Please see the history of present illness.    All other  systems reviewed and are negative.  EKGs/Labs/Other Studies Reviewed:    The following studies were reviewed today: Cardiac Studies & Procedures   ______________________________________________________________________________________________     ECHOCARDIOGRAM  ECHOCARDIOGRAM COMPLETE 12/19/2023  Narrative ECHOCARDIOGRAM REPORT    Patient Name:   Samuel Porter Date of Exam: 12/19/2023 Medical Rec #:  [id number]       Height:       73.0 in Accession #:    [id number]      Weight:       155.0 lb Date of Birth:  April 17, 1941        BSA:          1.931 m Patient Age:    82 years        BP:           116/54 mmHg Patient Gender: M               HR:           119  bpm. Exam Location:  [address]  Procedure: 2D Echo, Cardiac Doppler and Color Doppler (Both Spectral and Color Flow Doppler were utilized during procedure).  Indications:    I48.91* Unspeicified atrial fibrillation  History:        Patient Porter no prior history of Echocardiogram examinations. Risk Factors:Hypertension.  Sonographer:    Eulah Pont RDCS Referring Phys: [id number] Sabino Dick  IMPRESSIONS   1. Left ventricular ejection fraction, by estimation, is 35 to 40%. The left ventricle Porter moderately decreased function. The left ventricle Porter no regional wall motion abnormalities. Left ventricular diastolic function could not be evaluated. 2. Right ventricular systolic function is moderately reduced. The right ventricular size is normal. There is mildly elevated pulmonary artery systolic pressure. 3. Left atrial size was severely dilated. 4. Right atrial size was moderately dilated. 5. A small pericardial effusion is present. The pericardial effusion is localized near the right atrium. There is no evidence of cardiac tamponade. 6. The mitral valve is degenerative. Trivial mitral valve regurgitation. No evidence of mitral stenosis. 7. The aortic valve is tricuspid. There is mild calcification of the aortic valve. Aortic valve regurgitation is not visualized. No aortic stenosis is present. 8. Aortic dilatation noted. There is borderline dilatation of the aortic root, measuring 38 mm. 9. The inferior vena cava is normal in size with greater than 50% respiratory variability, suggesting right atrial pressure of 3 mmHg.  Conclusion(s)/Recommendation(s): Patient in atrial fibrillation with RVR and moderately reduced biventricular heart function. 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  FINDINGS Left Ventricle: Left ventricular ejection fraction, by estimation, is 35 to 40%. The left ventricle Porter moderately decreased function. The left ventricle Porter no regional wall motion  abnormalities. The left ventricular internal cavity size was normal in size. There is no left ventricular hypertrophy. Left ventricular diastolic function could not be evaluated due to atrial fibrillation. Left ventricular diastolic function could not be evaluated.  Right Ventricle: The right ventricular size is normal. No increase in right ventricular wall thickness. Right ventricular systolic function is moderately reduced. There is mildly elevated pulmonary artery systolic pressure. The tricuspid regurgitant velocity is 2.97 m/s, and with an assumed right atrial pressure of 3 mmHg, the estimated right ventricular systolic pressure is 38.3 mmHg.  Left Atrium: Left atrial size was severely dilated.  Right Atrium: Right atrial size was moderately dilated.  Pericardium: A small pericardial effusion is present. The pericardial effusion is localized near the right atrium. There is no evidence of cardiac tamponade.  Mitral Valve:  The mitral valve is degenerative in appearance. Trivial mitral valve regurgitation. No evidence of mitral valve stenosis.  Tricuspid Valve: The tricuspid valve is normal in structure. Tricuspid valve regurgitation is mild . No evidence of tricuspid stenosis.  Aortic Valve: The aortic valve is tricuspid. There is mild calcification of the aortic valve. Aortic valve regurgitation is not visualized. No aortic stenosis is present.  Pulmonic Valve: The pulmonic valve was normal in structure. Pulmonic valve regurgitation is trivial. No evidence of pulmonic stenosis.  Aorta: Aortic dilatation noted. There is borderline dilatation of the aortic root, measuring 38 mm.  Venous: The inferior vena cava is normal in size with greater than 50% respiratory variability, suggesting right atrial pressure of 3 mmHg.  IAS/Shunts: No atrial level shunt detected by color flow Doppler.   LEFT VENTRICLE PLAX 2D LVIDd:         4.77 cm LVIDs:         3.18 cm LV PW:         1.05 cm LV IVS:         1.04 cm LVOT diam:     2.00 cm LV SV:         44 LV SV Index:   23 LVOT Area:     3.14 cm  LV Volumes (MOD) LV vol d, MOD A2C: 110.0 ml LV vol d, MOD A4C: 121.0 ml LV vol s, MOD A2C: 61.6 ml LV vol s, MOD A4C: 65.4 ml LV SV MOD A2C:     48.4 ml LV SV MOD A4C:     121.0 ml LV SV MOD BP:      53.0 ml  RIGHT VENTRICLE TAPSE (M-mode): 2.2 cm  LEFT ATRIUM             Index        RIGHT ATRIUM           Index LA diam:        5.10 cm 2.64 cm/m   RA Area:     28.60 cm LA Vol (A2C):   69.7 ml 36.10 ml/m  RA Volume:   107.00 ml 55.42 ml/m LA Vol (A4C):   65.3 ml 33.82 ml/m LA Biplane Vol: 71.8 ml 37.19 ml/m AORTIC VALVE LVOT Vmax:   87.20 cm/s LVOT Vmean:  59.633 cm/s LVOT VTI:    0.139 m  AORTA Ao Root diam: 3.80 cm Ao Asc diam:  3.70 cm  TRICUSPID VALVE TR Peak grad:   35.3 mmHg TR Vmax:        297.00 cm/s  SHUNTS Systemic VTI:  0.14 m Systemic Diam: 2.00 cm  Arvilla Meres MD Electronically signed by Arvilla Meres MD Signature Date/Time: 12/19/2023/10:38:15 AM    Final          ______________________________________________________________________________________________      EKG:   EKG Interpretation Date/Time:  Thursday December 19 2023 16:31:39 EST Ventricular Rate:  113 PR Interval:    QRS Duration:  80 QT Interval:  312 QTC Calculation: 427 R Axis:   18  Text Interpretation: Atrial fibrillation with rapid ventricular response with premature ventricular or aberrantly conducted complexes Nonspecific ST and T wave abnormality When compared with ECG of 11-Jul-2023 16:59, PREVIOUS ECG IS PRESENT Confirmed by Tonny Bollman [phone]) on 12/19/2023 4:41:12 PM    Recent Labs: 07/11/2023: ALT 15; B Natriuretic Peptide 232.3 07/14/2023: BUN 22; Creatinine, Ser 0.83; Hemoglobin 13.8; Magnesium 2.1; Platelets 148; Potassium 4.5; Sodium 130  Recent Lipid Panel No results found for: "CHOL", "TRIG", "HDL", "  CHOLHDL", "VLDL", "LDLCALC",  "LDLDIRECT"   Risk Assessment/Calculations:    CHA2DS2-VASc Score = 4   This indicates a 4.8% annual risk of stroke. The patient's score is based upon: CHF History: 1 HTN History: 1 Diabetes History: 0 Stroke History: 0 Vascular Disease History: 0 Age Score: 2 Gender Score: 0            Physical Exam:    VS:  BP (!) 146/68 (BP Location: Left Arm, Patient Position: Sitting, Cuff Size: Normal)   Pulse (!) 113   Resp 16   Ht 6\' 1"  (1.854 m)   Wt 160 lb (72.6 kg)   SpO2 98%   BMI 21.11 kg/m     Wt Readings from Last 3 Encounters:  12/19/23 160 lb (72.6 kg)  07/12/23 155 lb (70.3 kg)  11/30/22 196 lb 4 oz (89 kg)     GEN: Thin elderly male in no acute distress HEENT: Normal NECK: No JVD; No carotid bruits LYMPHATICS: No lymphadenopathy CARDIAC: Irregularly irregular with no murmur gallop RESPIRATORY:  Clear to auscultation without rales, wheezing or rhonchi  ABDOMEN: Soft, non-tender, non-distended MUSCULOSKELETAL:  No edema; No deformity  SKIN: Warm and dry NEUROLOGIC:  Alert and oriented x 3 PSYCHIATRIC:  Normal affect   Assessment & Plan Persistent atrial fibrillation (HCC) Pt on apixaban and tolerating anticoagulation without bleeding problems.  He Porter discontinued aspirin.  With his LV dysfunction, even without overt signs of heart failure, rhythm control strategy seems appropriate.  I talked to him about treatment options today.  I have recommended some changes in his medical regimen.  He will stop amlodipine, start carvedilol 6.25 mg twice daily, and start valsartan 40 mg twice daily for treatment of cardiomyopathy, presumably tachycardia mediated.  Will proceed with elective cardioversion.  He understands that he may or may not maintain sinus rhythm, but this is a reasonable first-line approach before committing him to antiarrhythmic drug therapy.  He is noted to have severe left atrial enlargement so his chances of staying in sinus rhythm are somewhat reduced.   Will arrange follow-up in the atrial fibrillation clinic post cardioversion.  If he reverts to atrial fibrillation, amiodarone may be a good option for him. Pre-procedural cardiovascular examination Check labs prior to cardioversion to include a CBC and metabolic panel Squamous cell carcinoma of skin        Informed Consent   Shared Decision Making/Informed Consent The risks (stroke, cardiac arrhythmias rarely resulting in the need for a temporary or permanent pacemaker, skin irritation or burns and complications associated with conscious sedation including aspiration, arrhythmia, respiratory failure and death), benefits (restoration of normal sinus rhythm) and alternatives of a direct current cardioversion were explained in detail to Mr. Caltagirone and he agrees to proceed.         Medication Adjustments/Labs and Tests Ordered: Current medicines are reviewed at length with the patient today.  Concerns regarding medicines are outlined above.  Orders Placed This Encounter  Procedures   CBC   Basic metabolic panel   EKG 12-Lead   Meds ordered this encounter  Medications   carvedilol (COREG) 6.25 MG tablet    Sig: Take 1 tablet (6.25 mg total) by mouth 2 (two) times daily.    Dispense:  180 tablet    Refill:  3   valsartan (DIOVAN) 40 MG tablet    Sig: Take 1 tablet (40 mg total) by mouth 2 (two) times daily.    Dispense:  180 tablet    Refill:  3  Patient Instructions  Medication Instructions:  STOP Amlodipine START Carevedilol/Coreg 6.25mg  twice daily START Valsartan 40mg  twice daily *If you need a refill on your cardiac medications before your next appointment, please call your pharmacy*  Lab Work: CBC, BMET this week (prior to cardioversion) If you have labs (blood work) drawn today and your tests are completely normal, you will receive your results only by: MyChart Message (if you have MyChart) OR A paper copy in the mail If you have any lab test that is abnormal or  we need to change your treatment, we will call you to review the results.  Testing/Procedures: Cardioversion (DCCV) Your physician Porter recommended that you have a Cardioversion (DCCV). Electrical Cardioversion uses a jolt of electricity to your heart either through paddles or wired patches attached to your chest. This is a controlled, usually prescheduled, procedure. Defibrillation is done under light anesthesia in the hospital, and you usually go home the day of the procedure. This is done to get your heart back into a normal rhythm. You are not awake for the procedure. Please see the instruction sheet given to you today.  Follow-Up: At [facility], you and your health needs are our priority.  As part of our continuing mission to provide you with exceptional heart care, we have created designated Provider Care Teams.  These Care Teams include your primary Cardiologist (physician) and Advanced Practice Providers (APPs -  Physician Assistants and Nurse Practitioners) who all work together to provide you with the care you need, when you need it.  Your next appointment:   A-fib clinic after Cardioversion  Provider:   Tonny Bollman, MD    Other Instructions     Dear Cheryl Flash  You are scheduled for a TEE (Transesophageal Echocardiogram) Please arrive at the [facility] (Main Entrance A) at [facility]: [address] [place], [place] [address]  Free valet parking service is available. You will check in at ADMITTING.   *Please Note: You will receive a call the day before your procedure to confirm the appointment time. That time may have changed from the original time based on the schedule for that day.*   DIET:  Nothing to eat or drink after midnight except a sip of water with medications (see medication instructions below)  MEDICATION INSTRUCTIONS: !!IF ANY NEW MEDICATIONS ARE STARTED AFTER TODAY, PLEASE NOTIFY YOUR PROVIDER AS SOON AS POSSIBLE!!  FYI: Medications  such as Semaglutide (Ozempic, [place]), Tirzepatide (Mounjaro, Zepbound), Dulaglutide (Trulicity), etc ("GLP1 agonists") AND Canagliflozin (Invokana), Dapagliflozin (Farxiga), Empagliflozin (Jardiance), Ertugliflozin (Steglatro), Bexagliflozin [employer]) or any combination with one of these drugs such as Invokamet (Canagliflozin/Metformin), Synjardy (Empagliflozin/Metformin), etc ("SGLT2 inhibitors") must be held around the time of a procedure. This is not a comprehensive list of all of these drugs. Please review all of your medications and talk to your provider if you take any one of these. If you are not sure, ask your provider.  Continue taking your anticoagulant (blood thinner): Apixaban (Eliquis).  You will need to continue this after your procedure until you are told by your provider that it is safe to stop.    LABS: CBC, BMET prior to procedure  FYI:   Your support person will be asked to wait in the waiting room during your procedure.  It is OK to have someone drop you off and come back when you are ready to be discharged.  You cannot drive after the procedure and will need someone to drive you home.  Bring  your insurance cards.  *Special Note: Every effort is made to have your procedure done on time. Occasionally there are emergencies that occur at the hospital that may cause delays. Please be patient if a delay does occur.          Signed, Tonny Bollman, MD  12/19/2023 5:52 PM    [facility]

## 2023-12-19 NOTE — Patient Instructions (Signed)
 Medication Instructions:  STOP Amlodipine START Carevedilol/Coreg 6.25mg  twice daily START Valsartan 40mg  twice daily *If you need a refill on your cardiac medications before your next appointment, please call your pharmacy*  Lab Work: CBC, BMET this week (prior to cardioversion) If you have labs (blood work) drawn today and your tests are completely normal, you will receive your results only by: MyChart Message (if you have MyChart) OR A paper copy in the mail If you have any lab test that is abnormal or we need to change your treatment, we will call you to review the results.  Testing/Procedures: Cardioversion (DCCV) Your physician has recommended that you have a Cardioversion (DCCV). Electrical Cardioversion uses a jolt of electricity to your heart either through paddles or wired patches attached to your chest. This is a controlled, usually prescheduled, procedure. Defibrillation is done under light anesthesia in the hospital, and you usually go home the day of the procedure. This is done to get your heart back into a normal rhythm. You are not awake for the procedure. Please see the instruction sheet given to you today.  Follow-Up: At [facility], you and your health needs are our priority.  As part of our continuing mission to provide you with exceptional heart care, we have created designated Provider Care Teams.  These Care Teams include your primary Cardiologist (physician) and Advanced Practice Providers (APPs -  Physician Assistants and Nurse Practitioners) who all work together to provide you with the care you need, when you need it.  Your next appointment:   A-fib clinic after Cardioversion  Provider:   Tonny Bollman, MD    Other Instructions     Dear Samuel Porter  You are scheduled for a TEE (Transesophageal Echocardiogram) Please arrive at the [facility] (Main Entrance A) at [facility]: [address] [place], [place] [address]  Free valet parking  service is available. You will check in at ADMITTING.   *Please Note: You will receive a call the day before your procedure to confirm the appointment time. That time may have changed from the original time based on the schedule for that day.*   DIET:  Nothing to eat or drink after midnight except a sip of water with medications (see medication instructions below)  MEDICATION INSTRUCTIONS: !!IF ANY NEW MEDICATIONS ARE STARTED AFTER TODAY, PLEASE NOTIFY YOUR PROVIDER AS SOON AS POSSIBLE!!  FYI: Medications such as Semaglutide (Ozempic, [place]), Tirzepatide (Mounjaro, Zepbound), Dulaglutide (Trulicity), etc ("GLP1 agonists") AND Canagliflozin (Invokana), Dapagliflozin (Farxiga), Empagliflozin (Jardiance), Ertugliflozin (Steglatro), Bexagliflozin [employer]) or any combination with one of these drugs such as Invokamet (Canagliflozin/Metformin), Synjardy (Empagliflozin/Metformin), etc ("SGLT2 inhibitors") must be held around the time of a procedure. This is not a comprehensive list of all of these drugs. Please review all of your medications and talk to your provider if you take any one of these. If you are not sure, ask your provider.  Continue taking your anticoagulant (blood thinner): Apixaban (Eliquis).  You will need to continue this after your procedure until you are told by your provider that it is safe to stop.    LABS: CBC, BMET prior to procedure  FYI:   Your support person will be asked to wait in the waiting room during your procedure.  It is OK to have someone drop you off and come back when you are ready to be discharged.  You cannot drive after the procedure and will need someone to drive you home.  Bring your insurance cards.  *  Special Note: Every effort is made to have your procedure done on time. Occasionally there are emergencies that occur at the hospital that may cause delays. Please be patient if a delay does occur.

## 2023-12-19 NOTE — H&P (View-Only) (Signed)
 Cardiology Office Note:    Date:  12/19/2023   ID:  Samuel Porter, DOB April 04, 1941, MRN [id number]  PCP:  Farris Has, MD   [facility] Providers Cardiologist:  None     Referring MD: Farris Has, MD   Chief Complaint  Patient presents with   Atrial Fibrillation    History of Present Illness:    Samuel Porter is a 83 y.o. male presenting for evaluation of atrial fibrillation.  The patient was diagnosed with atrial fibrillation of new onset on November 22, 2023.  He is referred by his primary care provider for an echocardiogram and presented today for the study.  The echo showed that he was in atrial fibrillation with RVR and he had moderate LV dysfunction.  He was added onto my schedule for evaluation of atrial fibrillation and heart failure.  The patient has a pertinent history of squamous cell cancer and he underwent right modified radical neck dissection in July 2024.  He has lost a lot of weight but seems to have stabilized over the last several months.  He feels like he is doing pretty well in that regard.  The patient is here alone today.  He states that he is actually feeling fairly well.  He has been started on apixaban and is tolerating this without bleeding problems.  He has not had any orthopnea, PND, or leg swelling.  He does have mild exertional dyspnea.  No chest pain or pressure.  No heart palpitations.  He does not know when he went into atrial fibrillation as he has no awareness of the rhythm.  Thyroid studies were checked and TSH was within normal limits.  When he was hospitalized in 2024 there was some question of whether he might be in atrial fibrillation versus sinus tachycardia.   Current Medications: Current Meds  Medication Sig   acetaminophen (TYLENOL) 500 MG tablet Take 2 tablets (1,000 mg total) by mouth every 8 (eight) hours as needed for mild pain.   albuterol (VENTOLIN HFA) 108 (90 Base) MCG/ACT inhaler Inhale 2 puffs into the lungs every 4  (four) hours as needed.   carvedilol (COREG) 6.25 MG tablet Take 1 tablet (6.25 mg total) by mouth 2 (two) times daily.   cholecalciferol (VITAMIN D) 1000 units tablet Take 1,000 Units by mouth as directed. Twice weekly   dorzolamide-timolol (COSOPT) 22.3-6.8 MG/ML ophthalmic solution Place 1 drop into both eyes 2 (two) times daily.   ELIQUIS 5 MG TABS tablet Take 5 mg by mouth 2 (two) times daily.   finasteride (PROSCAR) 5 MG tablet Take 5 mg by mouth daily.   latanoprost (XALATAN) 0.005 % ophthalmic solution Place 1 drop into both eyes at bedtime.   polyethylene glycol (MIRALAX / GLYCOLAX) 17 g packet Take 17 g by mouth 2 (two) times daily as needed.   sildenafil (VIAGRA) 25 MG tablet Take 25 mg by mouth as needed for erectile dysfunction.   sulfamethoxazole-trimethoprim (BACTRIM DS) 800-160 MG tablet Take 1 tablet by mouth 2 (two) times daily.   valsartan (DIOVAN) 40 MG tablet Take 1 tablet (40 mg total) by mouth 2 (two) times daily.   [DISCONTINUED] amLODipine (NORVASC) 5 MG tablet Take 5 mg by mouth daily.   [DISCONTINUED] aspirin EC 81 MG tablet Take 1 tablet (81 mg total) by mouth daily. Swallow whole.     Allergies:   Patient has no known allergies.   ROS:   Please see the history of present illness.    All other  systems reviewed and are negative.  EKGs/Labs/Other Studies Reviewed:    The following studies were reviewed today: Cardiac Studies & Procedures   ______________________________________________________________________________________________     ECHOCARDIOGRAM  ECHOCARDIOGRAM COMPLETE 12/19/2023  Narrative ECHOCARDIOGRAM REPORT    Patient Name:   Samuel Porter Date of Exam: 12/19/2023 Medical Rec #:  [id number]       Height:       73.0 in Accession #:    [id number]      Weight:       155.0 lb Date of Birth:  April 17, 1941        BSA:          1.931 m Patient Age:    82 years        BP:           116/54 mmHg Patient Gender: M               HR:           119  bpm. Exam Location:  [address]  Procedure: 2D Echo, Cardiac Doppler and Color Doppler (Both Spectral and Color Flow Doppler were utilized during procedure).  Indications:    I48.91* Unspeicified atrial fibrillation  History:        Patient has no prior history of Echocardiogram examinations. Risk Factors:Hypertension.  Sonographer:    Eulah Pont RDCS Referring Phys: [id number] Sabino Dick  IMPRESSIONS   1. Left ventricular ejection fraction, by estimation, is 35 to 40%. The left ventricle has moderately decreased function. The left ventricle has no regional wall motion abnormalities. Left ventricular diastolic function could not be evaluated. 2. Right ventricular systolic function is moderately reduced. The right ventricular size is normal. There is mildly elevated pulmonary artery systolic pressure. 3. Left atrial size was severely dilated. 4. Right atrial size was moderately dilated. 5. A small pericardial effusion is present. The pericardial effusion is localized near the right atrium. There is no evidence of cardiac tamponade. 6. The mitral valve is degenerative. Trivial mitral valve regurgitation. No evidence of mitral stenosis. 7. The aortic valve is tricuspid. There is mild calcification of the aortic valve. Aortic valve regurgitation is not visualized. No aortic stenosis is present. 8. Aortic dilatation noted. There is borderline dilatation of the aortic root, measuring 38 mm. 9. The inferior vena cava is normal in size with greater than 50% respiratory variability, suggesting right atrial pressure of 3 mmHg.  Conclusion(s)/Recommendation(s): Patient in atrial fibrillation with RVR and moderately reduced biventricular heart function. 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  FINDINGS Left Ventricle: Left ventricular ejection fraction, by estimation, is 35 to 40%. The left ventricle has moderately decreased function. The left ventricle has no regional wall motion  abnormalities. The left ventricular internal cavity size was normal in size. There is no left ventricular hypertrophy. Left ventricular diastolic function could not be evaluated due to atrial fibrillation. Left ventricular diastolic function could not be evaluated.  Right Ventricle: The right ventricular size is normal. No increase in right ventricular wall thickness. Right ventricular systolic function is moderately reduced. There is mildly elevated pulmonary artery systolic pressure. The tricuspid regurgitant velocity is 2.97 m/s, and with an assumed right atrial pressure of 3 mmHg, the estimated right ventricular systolic pressure is 38.3 mmHg.  Left Atrium: Left atrial size was severely dilated.  Right Atrium: Right atrial size was moderately dilated.  Pericardium: A small pericardial effusion is present. The pericardial effusion is localized near the right atrium. There is no evidence of cardiac tamponade.  Mitral Valve:  The mitral valve is degenerative in appearance. Trivial mitral valve regurgitation. No evidence of mitral valve stenosis.  Tricuspid Valve: The tricuspid valve is normal in structure. Tricuspid valve regurgitation is mild . No evidence of tricuspid stenosis.  Aortic Valve: The aortic valve is tricuspid. There is mild calcification of the aortic valve. Aortic valve regurgitation is not visualized. No aortic stenosis is present.  Pulmonic Valve: The pulmonic valve was normal in structure. Pulmonic valve regurgitation is trivial. No evidence of pulmonic stenosis.  Aorta: Aortic dilatation noted. There is borderline dilatation of the aortic root, measuring 38 mm.  Venous: The inferior vena cava is normal in size with greater than 50% respiratory variability, suggesting right atrial pressure of 3 mmHg.  IAS/Shunts: No atrial level shunt detected by color flow Doppler.   LEFT VENTRICLE PLAX 2D LVIDd:         4.77 cm LVIDs:         3.18 cm LV PW:         1.05 cm LV IVS:         1.04 cm LVOT diam:     2.00 cm LV SV:         44 LV SV Index:   23 LVOT Area:     3.14 cm  LV Volumes (MOD) LV vol d, MOD A2C: 110.0 ml LV vol d, MOD A4C: 121.0 ml LV vol s, MOD A2C: 61.6 ml LV vol s, MOD A4C: 65.4 ml LV SV MOD A2C:     48.4 ml LV SV MOD A4C:     121.0 ml LV SV MOD BP:      53.0 ml  RIGHT VENTRICLE TAPSE (M-mode): 2.2 cm  LEFT ATRIUM             Index        RIGHT ATRIUM           Index LA diam:        5.10 cm 2.64 cm/m   RA Area:     28.60 cm LA Vol (A2C):   69.7 ml 36.10 ml/m  RA Volume:   107.00 ml 55.42 ml/m LA Vol (A4C):   65.3 ml 33.82 ml/m LA Biplane Vol: 71.8 ml 37.19 ml/m AORTIC VALVE LVOT Vmax:   87.20 cm/s LVOT Vmean:  59.633 cm/s LVOT VTI:    0.139 m  AORTA Ao Root diam: 3.80 cm Ao Asc diam:  3.70 cm  TRICUSPID VALVE TR Peak grad:   35.3 mmHg TR Vmax:        297.00 cm/s  SHUNTS Systemic VTI:  0.14 m Systemic Diam: 2.00 cm  Arvilla Meres MD Electronically signed by Arvilla Meres MD Signature Date/Time: 12/19/2023/10:38:15 AM    Final          ______________________________________________________________________________________________      EKG:   EKG Interpretation Date/Time:  Thursday December 19 2023 16:31:39 EST Ventricular Rate:  113 PR Interval:    QRS Duration:  80 QT Interval:  312 QTC Calculation: 427 R Axis:   18  Text Interpretation: Atrial fibrillation with rapid ventricular response with premature ventricular or aberrantly conducted complexes Nonspecific ST and T wave abnormality When compared with ECG of 11-Jul-2023 16:59, PREVIOUS ECG IS PRESENT Confirmed by Tonny Bollman [phone]) on 12/19/2023 4:41:12 PM    Recent Labs: 07/11/2023: ALT 15; B Natriuretic Peptide 232.3 07/14/2023: BUN 22; Creatinine, Ser 0.83; Hemoglobin 13.8; Magnesium 2.1; Platelets 148; Potassium 4.5; Sodium 130  Recent Lipid Panel No results found for: "CHOL", "TRIG", "HDL", "  CHOLHDL", "VLDL", "LDLCALC",  "LDLDIRECT"   Risk Assessment/Calculations:    CHA2DS2-VASc Score = 4   This indicates a 4.8% annual risk of stroke. The patient's score is based upon: CHF History: 1 HTN History: 1 Diabetes History: 0 Stroke History: 0 Vascular Disease History: 0 Age Score: 2 Gender Score: 0            Physical Exam:    VS:  BP (!) 146/68 (BP Location: Left Arm, Patient Position: Sitting, Cuff Size: Normal)   Pulse (!) 113   Resp 16   Ht 6\' 1"  (1.854 m)   Wt 160 lb (72.6 kg)   SpO2 98%   BMI 21.11 kg/m     Wt Readings from Last 3 Encounters:  12/19/23 160 lb (72.6 kg)  07/12/23 155 lb (70.3 kg)  11/30/22 196 lb 4 oz (89 kg)     GEN: Thin elderly male in no acute distress HEENT: Normal NECK: No JVD; No carotid bruits LYMPHATICS: No lymphadenopathy CARDIAC: Irregularly irregular with no murmur gallop RESPIRATORY:  Clear to auscultation without rales, wheezing or rhonchi  ABDOMEN: Soft, non-tender, non-distended MUSCULOSKELETAL:  No edema; No deformity  SKIN: Warm and dry NEUROLOGIC:  Alert and oriented x 3 PSYCHIATRIC:  Normal affect   Assessment & Plan Persistent atrial fibrillation (HCC) Pt on apixaban and tolerating anticoagulation without bleeding problems.  He has discontinued aspirin.  With his LV dysfunction, even without overt signs of heart failure, rhythm control strategy seems appropriate.  I talked to him about treatment options today.  I have recommended some changes in his medical regimen.  He will stop amlodipine, start carvedilol 6.25 mg twice daily, and start valsartan 40 mg twice daily for treatment of cardiomyopathy, presumably tachycardia mediated.  Will proceed with elective cardioversion.  He understands that he may or may not maintain sinus rhythm, but this is a reasonable first-line approach before committing him to antiarrhythmic drug therapy.  He is noted to have severe left atrial enlargement so his chances of staying in sinus rhythm are somewhat reduced.   Will arrange follow-up in the atrial fibrillation clinic post cardioversion.  If he reverts to atrial fibrillation, amiodarone may be a good option for him. Pre-procedural cardiovascular examination Check labs prior to cardioversion to include a CBC and metabolic panel Squamous cell carcinoma of skin        Informed Consent   Shared Decision Making/Informed Consent The risks (stroke, cardiac arrhythmias rarely resulting in the need for a temporary or permanent pacemaker, skin irritation or burns and complications associated with conscious sedation including aspiration, arrhythmia, respiratory failure and death), benefits (restoration of normal sinus rhythm) and alternatives of a direct current cardioversion were explained in detail to Mr. Caltagirone and he agrees to proceed.         Medication Adjustments/Labs and Tests Ordered: Current medicines are reviewed at length with the patient today.  Concerns regarding medicines are outlined above.  Orders Placed This Encounter  Procedures   CBC   Basic metabolic panel   EKG 12-Lead   Meds ordered this encounter  Medications   carvedilol (COREG) 6.25 MG tablet    Sig: Take 1 tablet (6.25 mg total) by mouth 2 (two) times daily.    Dispense:  180 tablet    Refill:  3   valsartan (DIOVAN) 40 MG tablet    Sig: Take 1 tablet (40 mg total) by mouth 2 (two) times daily.    Dispense:  180 tablet    Refill:  3  Patient Instructions  Medication Instructions:  STOP Amlodipine START Carevedilol/Coreg 6.25mg  twice daily START Valsartan 40mg  twice daily *If you need a refill on your cardiac medications before your next appointment, please call your pharmacy*  Lab Work: CBC, BMET this week (prior to cardioversion) If you have labs (blood work) drawn today and your tests are completely normal, you will receive your results only by: MyChart Message (if you have MyChart) OR A paper copy in the mail If you have any lab test that is abnormal or  we need to change your treatment, we will call you to review the results.  Testing/Procedures: Cardioversion (DCCV) Your physician has recommended that you have a Cardioversion (DCCV). Electrical Cardioversion uses a jolt of electricity to your heart either through paddles or wired patches attached to your chest. This is a controlled, usually prescheduled, procedure. Defibrillation is done under light anesthesia in the hospital, and you usually go home the day of the procedure. This is done to get your heart back into a normal rhythm. You are not awake for the procedure. Please see the instruction sheet given to you today.  Follow-Up: At [facility], you and your health needs are our priority.  As part of our continuing mission to provide you with exceptional heart care, we have created designated Provider Care Teams.  These Care Teams include your primary Cardiologist (physician) and Advanced Practice Providers (APPs -  Physician Assistants and Nurse Practitioners) who all work together to provide you with the care you need, when you need it.  Your next appointment:   A-fib clinic after Cardioversion  Provider:   Tonny Bollman, MD    Other Instructions     Dear Cheryl Flash  You are scheduled for a TEE (Transesophageal Echocardiogram) Please arrive at the [facility] (Main Entrance A) at [facility]: [address] [place], [place] [address]  Free valet parking service is available. You will check in at ADMITTING.   *Please Note: You will receive a call the day before your procedure to confirm the appointment time. That time may have changed from the original time based on the schedule for that day.*   DIET:  Nothing to eat or drink after midnight except a sip of water with medications (see medication instructions below)  MEDICATION INSTRUCTIONS: !!IF ANY NEW MEDICATIONS ARE STARTED AFTER TODAY, PLEASE NOTIFY YOUR PROVIDER AS SOON AS POSSIBLE!!  FYI: Medications  such as Semaglutide (Ozempic, [place]), Tirzepatide (Mounjaro, Zepbound), Dulaglutide (Trulicity), etc ("GLP1 agonists") AND Canagliflozin (Invokana), Dapagliflozin (Farxiga), Empagliflozin (Jardiance), Ertugliflozin (Steglatro), Bexagliflozin [employer]) or any combination with one of these drugs such as Invokamet (Canagliflozin/Metformin), Synjardy (Empagliflozin/Metformin), etc ("SGLT2 inhibitors") must be held around the time of a procedure. This is not a comprehensive list of all of these drugs. Please review all of your medications and talk to your provider if you take any one of these. If you are not sure, ask your provider.  Continue taking your anticoagulant (blood thinner): Apixaban (Eliquis).  You will need to continue this after your procedure until you are told by your provider that it is safe to stop.    LABS: CBC, BMET prior to procedure  FYI:   Your support person will be asked to wait in the waiting room during your procedure.  It is OK to have someone drop you off and come back when you are ready to be discharged.  You cannot drive after the procedure and will need someone to drive you home.  Bring  your insurance cards.  *Special Note: Every effort is made to have your procedure done on time. Occasionally there are emergencies that occur at the hospital that may cause delays. Please be patient if a delay does occur.          Signed, Tonny Bollman, MD  12/19/2023 5:52 PM    [facility]

## 2023-12-23 ENCOUNTER — Encounter: Payer: Self-pay | Admitting: Hematology and Oncology

## 2023-12-23 DIAGNOSIS — L821 Other seborrheic keratosis: Secondary | ICD-10-CM | POA: Diagnosis not present

## 2023-12-23 DIAGNOSIS — D692 Other nonthrombocytopenic purpura: Secondary | ICD-10-CM | POA: Diagnosis not present

## 2023-12-23 DIAGNOSIS — C4442 Squamous cell carcinoma of skin of scalp and neck: Secondary | ICD-10-CM | POA: Diagnosis not present

## 2023-12-23 DIAGNOSIS — D485 Neoplasm of uncertain behavior of skin: Secondary | ICD-10-CM | POA: Diagnosis not present

## 2023-12-23 DIAGNOSIS — L820 Inflamed seborrheic keratosis: Secondary | ICD-10-CM | POA: Diagnosis not present

## 2023-12-23 DIAGNOSIS — I4819 Other persistent atrial fibrillation: Secondary | ICD-10-CM | POA: Diagnosis not present

## 2023-12-23 DIAGNOSIS — Z85828 Personal history of other malignant neoplasm of skin: Secondary | ICD-10-CM | POA: Diagnosis not present

## 2023-12-23 DIAGNOSIS — L570 Actinic keratosis: Secondary | ICD-10-CM | POA: Diagnosis not present

## 2023-12-23 DIAGNOSIS — Z01810 Encounter for preprocedural cardiovascular examination: Secondary | ICD-10-CM | POA: Diagnosis not present

## 2023-12-23 DIAGNOSIS — L814 Other melanin hyperpigmentation: Secondary | ICD-10-CM | POA: Diagnosis not present

## 2023-12-24 ENCOUNTER — Encounter: Payer: Self-pay | Admitting: Hematology and Oncology

## 2023-12-24 LAB — CBC
Hematocrit: 41 % (ref 37.5–51.0)
Hemoglobin: 13.6 g/dL (ref 13.0–17.7)
MCH: 29.6 pg (ref 26.6–33.0)
MCHC: 33.2 g/dL (ref 31.5–35.7)
MCV: 89 fL (ref 79–97)
Platelets: 131 10*3/uL — ABNORMAL LOW (ref 150–450)
RBC: 4.6 x10E6/uL (ref 4.14–5.80)
RDW: 12.9 % (ref 11.6–15.4)
WBC: 4.5 10*3/uL (ref 3.4–10.8)

## 2023-12-24 LAB — BASIC METABOLIC PANEL
BUN/Creatinine Ratio: 27 — ABNORMAL HIGH (ref 10–24)
BUN: 20 mg/dL (ref 8–27)
CO2: 24 mmol/L (ref 20–29)
Calcium: 9.2 mg/dL (ref 8.6–10.2)
Chloride: 107 mmol/L — ABNORMAL HIGH (ref 96–106)
Creatinine, Ser: 0.73 mg/dL — ABNORMAL LOW (ref 0.76–1.27)
Glucose: 100 mg/dL — ABNORMAL HIGH (ref 70–99)
Potassium: 4.6 mmol/L (ref 3.5–5.2)
Sodium: 142 mmol/L (ref 134–144)
eGFR: 91 mL/min/{1.73_m2} (ref >59)

## 2023-12-24 NOTE — Anesthesia Preprocedure Evaluation (Signed)
 Anesthesia Evaluation  Patient identified by MRN, date of birth, ID band Patient awake    Reviewed: Allergy & Precautions, NPO status , Patient's Chart, lab work & pertinent test results  Airway       Comment: Previous grade II view with MAC 3, easy mask Dental   Pulmonary former smoker          Cardiovascular hypertension (carvedilol, valsartan), Pt. on home beta blockers + dysrhythmias Atrial Fibrillation   TTE 12/19/2023: IMPRESSIONS    1. Left ventricular ejection fraction, by estimation, is 35 to 40%. The  left ventricle has moderately decreased function. The left ventricle has  no regional wall motion abnormalities. Left ventricular diastolic function  could not be evaluated.   2. Right ventricular systolic function is moderately reduced. The right  ventricular size is normal. There is mildly elevated pulmonary artery  systolic pressure.   3. Left atrial size was severely dilated.   4. Right atrial size was moderately dilated.   5. A small pericardial effusion is present. The pericardial effusion is  localized near the right atrium. There is no evidence of cardiac  tamponade.   6. The mitral valve is degenerative. Trivial mitral valve regurgitation.  No evidence of mitral stenosis.   7. The aortic valve is tricuspid. There is mild calcification of the  aortic valve. Aortic valve regurgitation is not visualized. No aortic  stenosis is present.   8. Aortic dilatation noted. There is borderline dilatation of the aortic  root, measuring 38 mm.   9. The inferior vena cava is normal in size with greater than 50%     Neuro/Psych    GI/Hepatic   Endo/Other    Renal/GU    BPH    Musculoskeletal  (+) Arthritis , Osteoarthritis,    Abdominal   Peds  Hematology Lab Results      Component                Value               Date                      WBC                      4.5                 12/23/2023                 HGB                      13.6                12/23/2023                HCT                      41.0                12/23/2023                MCV                      89                  12/23/2023                PLT  131 (L)             12/23/2023              Anesthesia Other Findings Last Eliquis:  Reproductive/Obstetrics                              Anesthesia Physical Anesthesia Plan  ASA: 3  Anesthesia Plan: General   Post-op Pain Management:    Induction: Intravenous  PONV Risk Score and Plan: 2 and Treatment may vary due to age or medical condition  Airway Management Planned: Natural Airway and Nasal Cannula  Additional Equipment:   Intra-op Plan:   Post-operative Plan:   Informed Consent:      Dental advisory given  Plan Discussed with: CRNA and Anesthesiologist  Anesthesia Plan Comments: (Risks of general anesthesia discussed including, but not limited to, sore throat, hoarse voice, chipped/damaged teeth, injury to vocal cords, nausea and vomiting, allergic reactions, lung infection, heart attack, stroke, and death. All questions answered. )         Anesthesia Quick Evaluation

## 2023-12-24 NOTE — Progress Notes (Signed)
 Called patient with pre-procedure instructions for tomorrow. Left Detailed voicemail with following directions:  Time to arrive for procedure. 0900 Remain NPO past midnight.  Must have a ride home and a responsible adult to remain with them for 24 hours post procedure.  Confirmed blood thinner. Eliquis Confirmed no breaks in taking blood thinner for 3+ weeks prior to procedure. Confirmed patient stopped all GLP-1s and GLP-2s for at least one week before procedure.

## 2023-12-25 ENCOUNTER — Encounter (HOSPITAL_COMMUNITY): Payer: Self-pay | Admitting: Anesthesiology

## 2023-12-25 ENCOUNTER — Other Ambulatory Visit: Payer: Self-pay

## 2023-12-25 ENCOUNTER — Ambulatory Visit (HOSPITAL_COMMUNITY)
Admission: RE | Admit: 2023-12-25 | Discharge: 2023-12-25 | Disposition: A | Discharge status: Home / Self Care | Attending: Cardiovascular Disease | Admitting: Cardiovascular Disease

## 2023-12-25 ENCOUNTER — Encounter (HOSPITAL_COMMUNITY)
Admission: RE | Disposition: A | Discharge status: Home / Self Care | Payer: Self-pay | Source: Home / Self Care | Attending: Cardiovascular Disease

## 2023-12-25 DIAGNOSIS — Z85828 Personal history of other malignant neoplasm of skin: Secondary | ICD-10-CM | POA: Diagnosis not present

## 2023-12-25 DIAGNOSIS — I4819 Other persistent atrial fibrillation: Secondary | ICD-10-CM | POA: Diagnosis not present

## 2023-12-25 DIAGNOSIS — Z7901 Long term (current) use of anticoagulants: Secondary | ICD-10-CM | POA: Insufficient documentation

## 2023-12-25 DIAGNOSIS — Z79899 Other long term (current) drug therapy: Secondary | ICD-10-CM | POA: Diagnosis not present

## 2023-12-25 DIAGNOSIS — Z539 Procedure and treatment not carried out, unspecified reason: Secondary | ICD-10-CM | POA: Insufficient documentation

## 2023-12-25 SURGERY — CARDIOVERSION (CATH LAB)
Anesthesia: General

## 2023-12-25 NOTE — Interval H&P Note (Signed)
 Patient arrived in NSR. Procedure canceled.   Gerri Spore T. Flora Lipps, MD, [facility]  [facility]  [address], [address] [place], [place] [address] [phone]  9:36 AM

## 2023-12-25 NOTE — Progress Notes (Signed)
 Patient arrived in NSR. DCCV canceled.   Gerri Spore T. Flora Lipps, MD, [facility]  [facility]  [address] [place], [place] [address] [phone]  9:18 AM

## 2023-12-30 ENCOUNTER — Ambulatory Visit (HOSPITAL_COMMUNITY): Admitting: Physician Assistant

## 2024-01-08 ENCOUNTER — Ambulatory Visit (HOSPITAL_COMMUNITY)
Admission: RE | Admit: 2024-01-08 | Discharge: 2024-01-08 | Disposition: A | Discharge status: Home / Self Care | Source: Ambulatory Visit | Attending: Physician Assistant | Admitting: Physician Assistant

## 2024-01-08 VITALS — BP 120/90 | HR 135 | Ht 73.5 in | Wt 161.4 lb

## 2024-01-08 DIAGNOSIS — D6869 Other thrombophilia: Secondary | ICD-10-CM | POA: Insufficient documentation

## 2024-01-08 DIAGNOSIS — Z85820 Personal history of malignant melanoma of skin: Secondary | ICD-10-CM | POA: Diagnosis not present

## 2024-01-08 DIAGNOSIS — Z79899 Other long term (current) drug therapy: Secondary | ICD-10-CM | POA: Diagnosis not present

## 2024-01-08 DIAGNOSIS — I3139 Other pericardial effusion (noninflammatory): Secondary | ICD-10-CM | POA: Insufficient documentation

## 2024-01-08 DIAGNOSIS — I480 Paroxysmal atrial fibrillation: Secondary | ICD-10-CM | POA: Insufficient documentation

## 2024-01-08 DIAGNOSIS — I110 Hypertensive heart disease with heart failure: Secondary | ICD-10-CM | POA: Diagnosis not present

## 2024-01-08 DIAGNOSIS — I5022 Chronic systolic (congestive) heart failure: Secondary | ICD-10-CM | POA: Diagnosis not present

## 2024-01-08 DIAGNOSIS — Z7901 Long term (current) use of anticoagulants: Secondary | ICD-10-CM | POA: Diagnosis not present

## 2024-01-08 MED ORDER — AMIODARONE HCL 200 MG PO TABS: ORAL_TABLET | ORAL | 3 refills | Status: DC

## 2024-01-08 NOTE — Progress Notes (Signed)
 Primary Care Physician: Farris Has, MD Primary Cardiologist: Tonny Bollman, MD Electrophysiologist: None  Referring Physician: Dr Jerene Canny is a 83 y.o. 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  The patient was initially diagnosed with atrial fibrillation 11/22/23. Patient was started on Eliquis for stroke prevention. He was sent for an echocardiogram on 12/19/23 and presented in afib with RVR. Patient was unaware of his arrhythmia. He was seen by Dr Excell Seltzer and was started on carvedilol. Echo showed EF 35-40%, severe LAE, no significant valvular issues. He was set up for DCCV on 3/12 but presented in SR.   Patient presents today for follow up for atrial fibrillation. He is in rapid afib today but is cardiac unaware. He does have intermittent SOB but he had never previously associated this with his afib. There were no specific triggers that he could identify. No bleeding issues on anticoagulation.   Today, he denies symptoms of palpitations, chest pain, orthopnea, PND, lower extremity edema, dizziness, presyncope, syncope, snoring, daytime somnolence, bleeding, or neurologic sequela. The patient is tolerating medications without difficulties and is otherwise without complaint today.    Atrial Fibrillation Risk Factors:  he does not have symptoms or diagnosis of sleep apnea. he does not have a history of rheumatic fever. he does have a history of alcohol use. 3-4 drinks per week.  The patient does have a history of early familial atrial fibrillation or other arrhythmias. Mother had afib.   Atrial Fibrillation Management history:  Previous antiarrhythmic drugs: none Previous cardioversions: none Previous ablations: none Anticoagulation history: Eliquis  ROS- All systems are reviewed and negative except as per the HPI  above.  Past Medical History:  Diagnosis Date   Arthritis    oa   BPH (benign prostatic hyperplasia)    Cancer (HCC) 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   Cataracts, bilateral    Glaucoma    Hypertension    Vitamin D deficiency     Current Outpatient Medications  Medication Sig Dispense Refill   acetaminophen (TYLENOL) 500 MG tablet Take 2 tablets (1,000 mg total) by mouth every 8 (eight) hours as needed for mild pain. (Patient taking differently: Take 500 mg by mouth as needed for mild pain (pain score 1-3).)     albuterol (VENTOLIN HFA) 108 (90 Base) MCG/ACT inhaler Inhale 2 puffs into the lungs every 4 (four) hours as needed.     carvedilol (COREG) 6.25 MG tablet Take 1 tablet (6.25 mg total) by mouth 2 (two) times daily. 180 tablet 3   cholecalciferol (VITAMIN D) 1000 units tablet Take 1,000 Units by mouth daily.     dorzolamide-timolol (COSOPT) 22.3-6.8 MG/ML ophthalmic solution Place 1 drop into both eyes 2 (two) times daily.     ELIQUIS 5 MG TABS tablet Take 5 mg by mouth 2 (two) times daily.     finasteride (PROSCAR) 5 MG tablet Take 5 mg by mouth daily.     Multiple Vitamins-Minerals (PRESERVISION AREDS 2) CAPS Take 1 capsule by mouth 2 (two) times daily.     polyethylene glycol (MIRALAX / GLYCOLAX) 17 g packet Take 17 g by mouth 2 (two) times daily as needed.     sildenafil (VIAGRA) 25 MG tablet Take 25 mg by mouth as needed for erectile dysfunction.     valsartan (DIOVAN) 40 MG tablet Take 1  tablet (40 mg total) by mouth 2 (two) times daily. 180 tablet 3   No current facility-administered medications for this encounter.    Physical Exam: BP (!) 120/90   Pulse (!) 135   Ht 6' 1.5" (1.867 m)   Wt 73.2 kg   BMI 21.01 kg/m   GEN: Well nourished, well developed in no acute distress NECK: No JVD; No carotid bruits CARDIAC: Irregularly irregular rate and rhythm, no murmurs, rubs, gallops RESPIRATORY:  Clear to auscultation without rales, wheezing or rhonchi   ABDOMEN: Soft, non-tender, non-distended EXTREMITIES:  No edema; No deformity   Wt Readings from Last 3 Encounters:  01/08/24 73.2 kg  12/25/23 72.6 kg  12/19/23 72.6 kg     EKG today demonstrates  Afib with RVR, PVCs, couplet Vent. rate 135 BPM PR interval * ms QRS duration 72 ms QT/QTcB 280/420 ms   Echo 12/19/23 demonstrated   1. Left ventricular ejection fraction, by estimation, is 35 to 40%. The  left ventricle has moderately decreased function. The left ventricle has  no regional wall motion abnormalities. Left ventricular diastolic function  could not be evaluated.   2. Right ventricular systolic function is moderately reduced. The right  ventricular size is normal. There is mildly elevated pulmonary artery  systolic pressure.   3. Left atrial size was severely dilated.   4. Right atrial size was moderately dilated.   5. A small pericardial effusion is present. The pericardial effusion is  localized near the right atrium. There is no evidence of cardiac  tamponade.   6. The mitral valve is degenerative. Trivial mitral valve regurgitation.  No evidence of mitral stenosis.   7. The aortic valve is tricuspid. There is mild calcification of the  aortic valve. Aortic valve regurgitation is not visualized. No aortic  stenosis is present.   8. Aortic dilatation noted. There is borderline dilatation of the aortic  root, measuring 38 mm.   9. The inferior vena cava is normal in size with greater than 50%  respiratory variability, suggesting right atrial pressure of 3 mmHg.   Conclusion(s)/Recommendation(s): Patient in atrial fibrillation with RVR  and moderately reduced biventricular heart function. Consider  tachy-induced cardiomyopathy.    CHA2DS2-VASc Score = 4  The patient's score is based upon: CHF History: 1 HTN History: 1 Diabetes History: 0 Stroke History: 0 Vascular Disease History: 0 Age Score: 2 Gender Score: 0       ASSESSMENT AND PLAN: Paroxysmal  Atrial Fibrillation (ICD10:  I48.0) The patient's CHA2DS2-VASc score is 4, indicating a 4.8% annual risk of stroke.   General education about afib provided and questions answered.  We also discussed rhythm control options including AAD (dofetilide, amiodarone) and ablation. After discussing the risks and benefits, will start amiodarone 200 mg BID x 4 weeks then decrease to once daily. Last TSH and cmet reviewed.  Long term, patient would like to have a consultation with EP to discuss ablation.  Continue carvedilol 6.25 mg BID Continue Eliquis 5 mg BID  Secondary Hypercoagulable State (ICD10:  D68.69) The patient is at significant risk for stroke/thromboembolism based upon his CHA2DS2-VASc Score of 4.  Continue Apixaban (Eliquis). No bleeding issues.   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 Started on carvedilol and valsartan by Dr Excell Seltzer Will need repeat echo once stable in SR.  Fluid status appears stable today  HTN Stable on current regimen    Follow up in the AF clinic in two weeks for ECG and then with EP to discuss  ablation.        Jorja Loa PA-C Afib Clinic [facility] [address] [place], [place] [address] [phone]

## 2024-01-08 NOTE — Patient Instructions (Signed)
 Start Amiodarone 200 mg tablet- Taking 1 tablet by mouth twice daily x 1 month after will decrease to 1 tablet daily

## 2024-01-13 ENCOUNTER — Other Ambulatory Visit (HOSPITAL_COMMUNITY): Payer: Self-pay

## 2024-01-13 DIAGNOSIS — I4891 Unspecified atrial fibrillation: Secondary | ICD-10-CM

## 2024-01-17 ENCOUNTER — Other Ambulatory Visit: Payer: Self-pay

## 2024-01-22 ENCOUNTER — Encounter (HOSPITAL_COMMUNITY): Admitting: Physician Assistant

## 2024-01-23 ENCOUNTER — Ambulatory Visit (HOSPITAL_COMMUNITY)
Admission: RE | Admit: 2024-01-23 | Discharge: 2024-01-23 | Disposition: A | Discharge status: Home / Self Care | Source: Ambulatory Visit | Attending: Physician Assistant | Admitting: Physician Assistant

## 2024-01-23 DIAGNOSIS — D6869 Other thrombophilia: Secondary | ICD-10-CM | POA: Diagnosis not present

## 2024-01-23 DIAGNOSIS — I480 Paroxysmal atrial fibrillation: Secondary | ICD-10-CM | POA: Insufficient documentation

## 2024-01-23 DIAGNOSIS — Z79899 Other long term (current) drug therapy: Secondary | ICD-10-CM | POA: Diagnosis not present

## 2024-01-23 DIAGNOSIS — Z5181 Encounter for therapeutic drug level monitoring: Secondary | ICD-10-CM | POA: Insufficient documentation

## 2024-01-23 NOTE — Progress Notes (Signed)
 Patient returns for ECG after starting amiodarone. ECG shows:  Afib  Vent. rate 102 BPM PR interval * ms QRS duration 90 ms QT/QTcB 354/461 ms  Patient remains in afib. Will continue amiodarone 200 mg BID for two more weeks then decrease to once daily. F/u with Dr Elberta Fortis as scheduled. Will not arrange DCCV at this time as he presented for DCCV on 3/12 in [facility].

## 2024-01-24 ENCOUNTER — Ambulatory Visit: Payer: Medicare HMO | Admitting: Internal Medicine

## 2024-01-28 DIAGNOSIS — H353131 Nonexudative age-related macular degeneration, bilateral, early dry stage: Secondary | ICD-10-CM | POA: Diagnosis not present

## 2024-01-28 DIAGNOSIS — Z961 Presence of intraocular lens: Secondary | ICD-10-CM | POA: Diagnosis not present

## 2024-01-28 DIAGNOSIS — H401123 Primary open-angle glaucoma, left eye, severe stage: Secondary | ICD-10-CM | POA: Diagnosis not present

## 2024-01-28 DIAGNOSIS — H35352 Cystoid macular degeneration, left eye: Secondary | ICD-10-CM | POA: Diagnosis not present

## 2024-01-28 DIAGNOSIS — H35372 Puckering of macula, left eye: Secondary | ICD-10-CM | POA: Diagnosis not present

## 2024-01-28 DIAGNOSIS — H401111 Primary open-angle glaucoma, right eye, mild stage: Secondary | ICD-10-CM | POA: Diagnosis not present

## 2024-02-04 ENCOUNTER — Encounter: Payer: Self-pay | Admitting: Cardiovascular Disease

## 2024-02-04 DIAGNOSIS — R9389 Abnormal findings on diagnostic imaging of other specified body structures: Secondary | ICD-10-CM | POA: Diagnosis not present

## 2024-02-04 DIAGNOSIS — C779 Secondary and unspecified malignant neoplasm of lymph node, unspecified: Secondary | ICD-10-CM | POA: Diagnosis not present

## 2024-02-04 DIAGNOSIS — J9811 Atelectasis: Secondary | ICD-10-CM | POA: Diagnosis not present

## 2024-02-04 DIAGNOSIS — J984 Other disorders of lung: Secondary | ICD-10-CM | POA: Diagnosis not present

## 2024-02-04 DIAGNOSIS — R918 Other nonspecific abnormal finding of lung field: Secondary | ICD-10-CM | POA: Diagnosis not present

## 2024-02-04 DIAGNOSIS — J90 Pleural effusion, not elsewhere classified: Secondary | ICD-10-CM | POA: Diagnosis not present

## 2024-02-04 DIAGNOSIS — R590 Localized enlarged lymph nodes: Secondary | ICD-10-CM | POA: Diagnosis not present

## 2024-02-04 DIAGNOSIS — C4492 Squamous cell carcinoma of skin, unspecified: Secondary | ICD-10-CM | POA: Diagnosis not present

## 2024-02-06 DIAGNOSIS — J90 Pleural effusion, not elsewhere classified: Secondary | ICD-10-CM | POA: Diagnosis not present

## 2024-02-06 DIAGNOSIS — Z79899 Other long term (current) drug therapy: Secondary | ICD-10-CM | POA: Diagnosis not present

## 2024-02-07 MED ORDER — FUROSEMIDE 20 MG PO TABS: 20.0000 mg | ORAL_TABLET | Freq: Every day | ORAL | 3 refills | Status: AC

## 2024-02-07 MED ORDER — FUROSEMIDE 20 MG PO TABS: 20.0000 mg | ORAL_TABLET | Freq: Every day | ORAL | 3 refills | Status: DC

## 2024-02-07 NOTE — Telephone Encounter (Signed)
 He should start furosemide 20 mg daily. Keep appt with Dr Lawana Pray as scheduled. Might be best to put on anti-arrhythmic drug Rx like amiodarone  to help his cardiac function. Doubt he will be a good ablation candidate at this point.   Samuel Porter - can you add him onto my schedule sometime in the next 2-3 weeks so that we can work more on GDMT?  I don't know if the large right pleural effusion is from heart failure or another unrelated problem. We will see what the pulmonary team at [facility] thinks.  Thx

## 2024-02-10 NOTE — Progress Notes (Unsigned)
 Electrophysiology Office Note:   Date:  02/11/2024  ID:  Samuel Porter, DOB [birth year], MRN [id number]  Primary Cardiologist: Arnoldo Lapping, MD Primary Heart Failure: None Electrophysiologist: Elzora Cullins [place] Ding, MD      History of Present Illness:   Samuel Porter is a 83 y.o. male with h/o hypertension, CHF, melanoma, squamous cell cancer and radical neck dissection, atrial fibrillation seen today for  for Electrophysiology evaluation of atrial fibrillation at the request of Myrtha Ates.    He was diagnosed with atrial fibrillation 11/22/2023.  He was started on Eliquis.  He was sent for echo presented with rapid atrial fibrillation.  He is unaware of his arrhythmia.  Ejection fraction 35 to 40% with severe LAE.  He had cardioversion attempt 12/25/2023 but presented in sinus rhythm.  He presented in atrial fibrillation clinic in rapid atrial fibrillation.  He has intermittent shortness of breath, not necessarily associated with atrial fibrillation.  Last week, he was found to have a large pleural effusion that was drained at [facility].  He is undergoing workup for the cause of this.  Today, he presents feeling somewhat short of breath and fatigue.  He has been feeling this way since his cardioversion.  He is gone back into atrial fibrillation and has been loaded on amiodarone .  He did miss doses of anticoagulation around the time of his thoracentesis.  Today, denies symptoms of palpitations, chest pain,  lower extremity edema, claudication, dizziness, presyncope, syncope, bleeding, or neurologic sequela. The patient is tolerating medications without difficulties.    Review of systems complete and found to be negative unless listed in HPI.   EP Information / Studies Reviewed:    EKG is ordered today. Personal review as below.  EKG Interpretation Date/Time:  Tuesday February 11 2024 11:37:31 EDT Ventricular Rate:  78 PR Interval:    QRS Duration:  104 QT Interval:  438 QTC Calculation: 499 R  Axis:   96  Text Interpretation: Atrial fibrillation with premature ventricular or aberrantly conducted complexes Rightward axis Incomplete right bundle branch block ST & Marked T wave abnormality, consider anterolateral ischemia Prolonged QT When compared with ECG of 23-Jan-2024 09:41, Aberrant conduction of supraventricular beat (s) Confirmed by Cambri Plourde ([id number]) on 02/11/2024 12:07:36 PM     Risk Assessment/Calculations:    CHA2DS2-VASc Score = 4   This indicates a 4.8% annual risk of stroke. The patient's score is based upon: CHF History: 1 HTN History: 1 Diabetes History: 0 Stroke History: 0 Vascular Disease History: 0 Age Score: 2 Gender Score: 0             Physical Exam:   VS:  BP 114/62 (BP Location: Right Arm, Patient Position: Sitting, Cuff Size: Normal)   Pulse 78   Ht 6' 1.5" (1.867 m)   Wt 176 lb (79.8 kg)   SpO2 97%   BMI 22.91 kg/m    Wt Readings from Last 3 Encounters:  02/11/24 176 lb (79.8 kg)  01/08/24 161 lb 6.4 oz (73.2 kg)  12/25/23 160 lb (72.6 kg)     GEN: Well nourished, well developed in no acute distress NECK: No JVD; No carotid bruits CARDIAC: Irregularly irregular rate and rhythm, no murmurs, rubs, gallops RESPIRATORY:  Clear to auscultation without rales, wheezing or rhonchi  ABDOMEN: Soft, non-tender, non-distended EXTREMITIES:  No edema; No deformity   ASSESSMENT AND PLAN:    1.  Persistent atrial fibrillation: On carvedilol  6.25 mg twice daily, amiodarone  200 mg daily.  He is in atrial  fibrillation today.  He feels weak and fatigued.  He would benefit from cardioversion for rhythm control.  He has missed a dose of his anticoagulation and thus we Elene Downum plan for rhythm control in 3 to 4 weeks.  Dhani Dannemiller have him follow-up in A-fib clinic afterwards.  I think rhythm control with amiodarone  is likely the best option for now.  2.  Secondary hypercoagulable state: On Eliquis for atrial fibrillation  3.  Chronic systolic heart failure:  Ejection fraction 35 to 40%.  Potentially tachycardia mediated.  On medical therapy per primary cardiology.  Family is asking about workup for amyloidosis.  Shrika Milos discuss further with his primary cardiologist.  4.  Hypertension: Well-controlled  Follow up with Afib Clinic as usual post procedure  Signed, Monterrio Gerst [place] Ding, MD

## 2024-02-11 ENCOUNTER — Encounter: Payer: Self-pay | Admitting: Cardiology

## 2024-02-11 ENCOUNTER — Other Ambulatory Visit: Payer: Self-pay | Admitting: *Deleted

## 2024-02-11 ENCOUNTER — Ambulatory Visit: Attending: Cardiology | Admitting: Cardiology

## 2024-02-11 VITALS — BP 114/62 | HR 78 | Ht 73.5 in | Wt 176.0 lb

## 2024-02-11 DIAGNOSIS — I4819 Other persistent atrial fibrillation: Secondary | ICD-10-CM

## 2024-02-11 DIAGNOSIS — Z5181 Encounter for therapeutic drug level monitoring: Secondary | ICD-10-CM | POA: Diagnosis not present

## 2024-02-11 DIAGNOSIS — D6869 Other thrombophilia: Secondary | ICD-10-CM

## 2024-02-11 DIAGNOSIS — I5022 Chronic systolic (congestive) heart failure: Secondary | ICD-10-CM | POA: Diagnosis not present

## 2024-02-11 DIAGNOSIS — Z79899 Other long term (current) drug therapy: Secondary | ICD-10-CM

## 2024-02-11 NOTE — Patient Instructions (Addendum)
 Medication Instructions:  Your physician recommends that you continue on your current medications as directed. Please refer to the Current Medication list given to you today.  *If you need a refill on your cardiac medications before your next appointment, please call your pharmacy*   Lab Work: Today: cbc, bmet  Testing/Procedures: Your physician has recommended that you have a Cardioversion (DCCV). Electrical Cardioversion uses a jolt of electricity to your heart either through paddles or wired patches attached to your chest. This is a controlled, usually prescheduled, procedure. Defibrillation is done under light anesthesia in the hospital, and you usually go home the day of the procedure. This is done to get your heart back into a normal rhythm. You are not awake for the procedure. Please see the instruction sheet given to you today.   Follow-Up: At [facility], you and your health needs are our priority.  As part of our continuing mission to provide you with exceptional heart care, we have created designated Provider Care Teams.  These Care Teams include your primary Cardiologist (physician) and Advanced Practice Providers (APPs -  Physician Assistants and Nurse Practitioners) who all work together to provide you with the care you need, when you need it.   Your next appointment:   2 month(s)  The format for your next appointment:   In Person  Provider:   You will follow up in the Atrial Fibrillation Clinic located at [facility]. Your provider will be: Clint R. Fenton, PA-C or Minnie Amber, PA-C    Thank you for choosing [employer]!!   Reece Cane, RN [phone]      Dear Samuel Porter  You are scheduled for a Cardioversion on Thursday, May 22 with Dr. Maximo Spar.  Please arrive at the [facility] (Main Entrance A) at [facility]: [address] [place], [place] [address] at 10:00 AM (This time is ONE hour(s) before your procedure to ensure your  preparation).   Free valet parking service is available. You will check in at ADMITTING.   *Please Note: You will receive a call the day before your procedure to confirm the appointment time. That time may have changed from the original time based on the schedule for that day.*    DIET:  Nothing to eat or drink after midnight except a sip of water  with medications (see medication instructions below)  MEDICATION INSTRUCTIONS: !!IF ANY NEW MEDICATIONS ARE STARTED AFTER TODAY, PLEASE NOTIFY YOUR PROVIDER AS SOON AS POSSIBLE!!  FYI: Medications such as Semaglutide (Ozempic, [place]), Tirzepatide (Mounjaro, Zepbound), Dulaglutide (Trulicity), etc ("GLP1 agonists") AND Canagliflozin (Invokana), Dapagliflozin (Farxiga), Empagliflozin (Jardiance), Ertugliflozin (Steglatro), Bexagliflozin [employer]) or any combination with one of these drugs such as Invokamet (Canagliflozin/Metformin), Synjardy (Empagliflozin/Metformin), etc ("SGLT2 inhibitors") must be held around the time of a procedure. This is not a comprehensive list of all of these drugs. Please review all of your medications and talk to your provider if you take any one of these. If you are not sure, ask your provider.   Hold Lasix (furosemide) day of procedure.   Continue taking your anticoagulant (blood thinner): Apixaban (Eliquis).  You will need to continue this after your procedure until you are told by your provider that it is safe to stop.    LABS:   Come to the lab at the [facility] ([address], [place], 1st Floor) between the hours of 8:00 am and 4:30 pm. You do NOT have to be fasting.  FYI:  Your support person will be asked to wait in the waiting room during your procedure.  It is OK to have someone drop you off and come back when you are ready to be discharged.  You cannot drive after the procedure and will need someone to drive you home.  Bring your insurance cards.  *Special  Note: Every effort is made to have your procedure done on time. Occasionally there are emergencies that occur at the hospital that may cause delays. Please be patient if a delay does occur.

## 2024-02-12 ENCOUNTER — Encounter: Payer: Self-pay | Admitting: Hematology and Oncology

## 2024-02-12 DIAGNOSIS — I4819 Other persistent atrial fibrillation: Secondary | ICD-10-CM | POA: Diagnosis not present

## 2024-02-13 ENCOUNTER — Other Ambulatory Visit: Payer: Self-pay

## 2024-02-13 ENCOUNTER — Encounter: Payer: Self-pay | Admitting: Hematology and Oncology

## 2024-02-13 LAB — CBC
Hematocrit: 40.5 % (ref 37.5–51.0)
Hemoglobin: 13.4 g/dL (ref 13.0–17.7)
MCH: 29.7 pg (ref 26.6–33.0)
MCHC: 33.1 g/dL (ref 31.5–35.7)
MCV: 90 fL (ref 79–97)
Platelets: 133 10*3/uL — ABNORMAL LOW (ref 150–450)
RBC: 4.51 x10E6/uL (ref 4.14–5.80)
RDW: 13.5 % (ref 11.6–15.4)
WBC: 5.6 10*3/uL (ref 3.4–10.8)

## 2024-02-13 LAB — BASIC METABOLIC PANEL WITH GFR
BUN/Creatinine Ratio: 21 (ref 10–24)
BUN: 22 mg/dL (ref 8–27)
CO2: 30 mmol/L — ABNORMAL HIGH (ref 20–29)
Calcium: 9 mg/dL (ref 8.6–10.2)
Chloride: 99 mmol/L (ref 96–106)
Creatinine, Ser: 1.07 mg/dL (ref 0.76–1.27)
Glucose: 88 mg/dL (ref 70–99)
Potassium: 4.4 mmol/L (ref 3.5–5.2)
Sodium: 139 mmol/L (ref 134–144)
eGFR: 69 mL/min/{1.73_m2} (ref >59)

## 2024-02-26 ENCOUNTER — Encounter: Payer: Self-pay | Admitting: Cardiovascular Disease

## 2024-02-26 ENCOUNTER — Ambulatory Visit: Attending: Cardiovascular Disease | Admitting: Cardiovascular Disease

## 2024-02-26 ENCOUNTER — Other Ambulatory Visit: Payer: Self-pay

## 2024-02-26 VITALS — BP 110/70 | HR 61 | Ht 73.0 in | Wt 163.4 lb

## 2024-02-26 DIAGNOSIS — I4819 Other persistent atrial fibrillation: Secondary | ICD-10-CM

## 2024-02-26 DIAGNOSIS — Z79899 Other long term (current) drug therapy: Secondary | ICD-10-CM

## 2024-02-26 DIAGNOSIS — J90 Pleural effusion, not elsewhere classified: Secondary | ICD-10-CM | POA: Diagnosis not present

## 2024-02-26 DIAGNOSIS — I5020 Unspecified systolic (congestive) heart failure: Secondary | ICD-10-CM

## 2024-02-26 MED ORDER — DAPAGLIFLOZIN PROPANEDIOL 10 MG PO TABS: 10.0000 mg | ORAL_TABLET | Freq: Every day | ORAL | 11 refills | Status: AC

## 2024-02-26 MED ORDER — DAPAGLIFLOZIN PROPANEDIOL 10 MG PO TABS: 10.0000 mg | ORAL_TABLET | Freq: Every day | ORAL | 0 refills | Status: DC

## 2024-02-26 MED ORDER — SPIRONOLACTONE 25 MG PO TABS: 12.5000 mg | ORAL_TABLET | Freq: Every day | ORAL | 3 refills | Status: DC

## 2024-02-26 NOTE — Progress Notes (Signed)
 Cardiology Office Note:    Date:  02/26/2024   ID:  Samuel Porter, DOB 1941/08/28, MRN [id number]  PCP:  [name], MD   [facility] Providers Cardiologist:  Arnoldo Lapping, MD Electrophysiologist:  Will Cortland Ding, MD     Referring MD: [name], MD   Chief Complaint  Patient presents with   Atrial Fibrillation   Shortness of Breath    History of Present Illness:    Samuel Porter is a 83 y.o. male presenting for follow-up of atrial fibrillation.  I initially saw him December 19, 2023 as a new patient for evaluation of A-fib.  He also has a history of HFrEF.  He underwent cardioversion with early return of atrial fibrillation and was started on amiodarone .  Plans are noted to perform repeat cardioversion with him on amiodarone .  He was also found to have a large pleural effusion and underwent thoracentesis.  He tells me that about 2.5 L were removed from his right lung.  The patient is doing okay at present.  He continues to have some shortness of breath, worse with lying supine and worse with physical activity.  He reports no edema.  He denies cough.  No chest pain or pressure.  He is scheduled for cardioversion next week.  He reports that he has been compliant with apixaban and amiodarone .  He denies any bleeding problems.   Current Medications: Current Meds  Medication Sig   acetaminophen  (TYLENOL ) 500 MG tablet Take 2 tablets (1,000 mg total) by mouth every 8 (eight) hours as needed for mild pain. (Patient taking differently: Take 500 mg by mouth as needed for mild pain (pain score 1-3).)   albuterol  (VENTOLIN  HFA) 108 (90 Base) MCG/ACT inhaler Inhale 2 puffs into the lungs every 4 (four) hours as needed.   amiodarone  (PACERONE ) 200 MG tablet Take 200 mg by mouth daily.   carvedilol  (COREG ) 6.25 MG tablet Take 1 tablet (6.25 mg total) by mouth 2 (two) times daily.   cholecalciferol  (VITAMIN D ) 1000 units tablet Take 1,000 Units by mouth daily.   dapagliflozin  propanediol (FARXIGA) 10 MG TABS tablet Take 1 tablet (10 mg total) by mouth daily before breakfast.   dorzolamide -timolol  (COSOPT ) 22.3-6.8 MG/ML ophthalmic solution Place 1 drop into both eyes 2 (two) times daily.   ELIQUIS 5 MG TABS tablet Take 5 mg by mouth 2 (two) times daily.   finasteride  (PROSCAR ) 5 MG tablet Take 5 mg by mouth daily.   furosemide  (LASIX ) 20 MG tablet Take 1 tablet (20 mg total) by mouth daily.   Multiple Vitamins-Minerals (PRESERVISION AREDS 2) CAPS Take 1 capsule by mouth 2 (two) times daily.   polyethylene glycol (MIRALAX  / GLYCOLAX ) 17 g packet Take 17 g by mouth 2 (two) times daily as needed.   sildenafil (VIAGRA) 25 MG tablet Take 25 mg by mouth as needed for erectile dysfunction.   spironolactone (ALDACTONE) 25 MG tablet Take 0.5 tablets (12.5 mg total) by mouth daily.   valsartan  (DIOVAN ) 40 MG tablet Take 1 tablet (40 mg total) by mouth 2 (two) times daily.   [DISCONTINUED] amiodarone  (PACERONE ) 200 MG tablet Take 1 tablet by mouth twice daily for 1 month and after will decrease to 1 tablet daily     Allergies:   Patient has no known allergies.   ROS:   Please see the history of present illness.    All other systems reviewed and are negative.  EKGs/Labs/Other Studies Reviewed:    The following studies  were reviewed today: Cardiac Studies & Procedures   ______________________________________________________________________________________________     ECHOCARDIOGRAM  ECHOCARDIOGRAM COMPLETE 12/19/2023  Narrative ECHOCARDIOGRAM REPORT    Patient Name:   Samuel Porter Date of Exam: 12/19/2023 Medical Rec #:  [id number]       Height:       73.0 in Accession #:    [id number]      Weight:       155.0 lb Date of Birth:  1941/08/20        BSA:          1.931 m Patient Age:    82 years        BP:           116/54 mmHg Patient Gender: M               HR:           119 bpm. Exam Location:  [address]  Procedure: 2D Echo, Cardiac Doppler and Color  Doppler (Both Spectral and Color Flow Doppler were utilized during procedure).  Indications:    I48.91* Unspeicified atrial fibrillation  History:        Patient has no prior history of Echocardiogram examinations. Risk Factors:Hypertension.  Sonographer:    Ruta Cousins RDCS Referring Phys: [id number] Karlton Overly  IMPRESSIONS   1. Left ventricular ejection fraction, by estimation, is 35 to 40%. The left ventricle has moderately decreased function. The left ventricle has no regional wall motion abnormalities. Left ventricular diastolic function could not be evaluated. 2. Right ventricular systolic function is moderately reduced. The right ventricular size is normal. There is mildly elevated pulmonary artery systolic pressure. 3. Left atrial size was severely dilated. 4. Right atrial size was moderately dilated. 5. A small pericardial effusion is present. The pericardial effusion is localized near the right atrium. There is no evidence of cardiac tamponade. 6. The mitral valve is degenerative. Trivial mitral valve regurgitation. No evidence of mitral stenosis. 7. The aortic valve is tricuspid. There is mild calcification of the aortic valve. Aortic valve regurgitation is not visualized. No aortic stenosis is present. 8. Aortic dilatation noted. There is borderline dilatation of the aortic root, measuring 38 mm. 9. The inferior vena cava is normal in size with greater than 50% respiratory variability, suggesting right atrial pressure of 3 mmHg.  Conclusion(s)/Recommendation(s): Patient in atrial fibrillation with RVR and moderately reduced biventricular heart function. 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  FINDINGS Left Ventricle: Left ventricular ejection fraction, by estimation, is 35 to 40%. The left ventricle has moderately decreased function. The left ventricle has no regional wall motion abnormalities. The left ventricular internal cavity size was normal in size. There is no  left ventricular hypertrophy. Left ventricular diastolic function could not be evaluated due to atrial fibrillation. Left ventricular diastolic function could not be evaluated.  Right Ventricle: The right ventricular size is normal. No increase in right ventricular wall thickness. Right ventricular systolic function is moderately reduced. There is mildly elevated pulmonary artery systolic pressure. The tricuspid regurgitant velocity is 2.97 m/s, and with an assumed right atrial pressure of 3 mmHg, the estimated right ventricular systolic pressure is 38.3 mmHg.  Left Atrium: Left atrial size was severely dilated.  Right Atrium: Right atrial size was moderately dilated.  Pericardium: A small pericardial effusion is present. The pericardial effusion is localized near the right atrium. There is no evidence of cardiac tamponade.  Mitral Valve: The mitral valve is degenerative in appearance. Trivial mitral valve regurgitation. No evidence of mitral  valve stenosis.  Tricuspid Valve: The tricuspid valve is normal in structure. Tricuspid valve regurgitation is mild . No evidence of tricuspid stenosis.  Aortic Valve: The aortic valve is tricuspid. There is mild calcification of the aortic valve. Aortic valve regurgitation is not visualized. No aortic stenosis is present.  Pulmonic Valve: The pulmonic valve was normal in structure. Pulmonic valve regurgitation is trivial. No evidence of pulmonic stenosis.  Aorta: Aortic dilatation noted. There is borderline dilatation of the aortic root, measuring 38 mm.  Venous: The inferior vena cava is normal in size with greater than 50% respiratory variability, suggesting right atrial pressure of 3 mmHg.  IAS/Shunts: No atrial level shunt detected by color flow Doppler.   LEFT VENTRICLE PLAX 2D LVIDd:         4.77 cm LVIDs:         3.18 cm LV PW:         1.05 cm LV IVS:        1.04 cm LVOT diam:     2.00 cm LV SV:         44 LV SV Index:   23 LVOT Area:      3.14 cm  LV Volumes (MOD) LV vol d, MOD A2C: 110.0 ml LV vol d, MOD A4C: 121.0 ml LV vol s, MOD A2C: 61.6 ml LV vol s, MOD A4C: 65.4 ml LV SV MOD A2C:     48.4 ml LV SV MOD A4C:     121.0 ml LV SV MOD BP:      53.0 ml  RIGHT VENTRICLE TAPSE (M-mode): 2.2 cm  LEFT ATRIUM             Index        RIGHT ATRIUM           Index LA diam:        5.10 cm 2.64 cm/m   RA Area:     28.60 cm LA Vol (A2C):   69.7 ml 36.10 ml/m  RA Volume:   107.00 ml 55.42 ml/m LA Vol (A4C):   65.3 ml 33.82 ml/m LA Biplane Vol: 71.8 ml 37.19 ml/m AORTIC VALVE LVOT Vmax:   87.20 cm/s LVOT Vmean:  59.633 cm/s LVOT VTI:    0.139 m  AORTA Ao Root diam: 3.80 cm Ao Asc diam:  3.70 cm  TRICUSPID VALVE TR Peak grad:   35.3 mmHg TR Vmax:        297.00 cm/s  SHUNTS Systemic VTI:  0.14 m Systemic Diam: 2.00 cm  Jules Oar MD Electronically signed by Jules Oar MD Signature Date/Time: 12/19/2023/10:38:15 AM    Final          ______________________________________________________________________________________________      EKG:        Recent Labs: 07/11/2023: ALT 15; B Natriuretic Peptide 232.3 07/14/2023: Magnesium 2.1 02/12/2024: BUN 22; Creatinine, Ser 1.07; Hemoglobin 13.4; Platelets 133; Potassium 4.4; Sodium 139  Recent Lipid Panel No results found for: "CHOL", "TRIG", "HDL", "CHOLHDL", "VLDL", "LDLCALC", "LDLDIRECT"   Risk Assessment/Calculations:    CHA2DS2-VASc Score = 4   This indicates a 4.8% annual risk of stroke. The patient's score is based upon: CHF History: 1 HTN History: 1 Diabetes History: 0 Stroke History: 0 Vascular Disease History: 0 Age Score: 2 Gender Score: 0               Physical Exam:    VS:  BP 110/70   Pulse 61   Ht 6\' 1"  (1.854 m)  Wt 163 lb 6.4 oz (74.1 kg)   SpO2 93%   BMI 21.56 kg/m     Wt Readings from Last 3 Encounters:  02/26/24 163 lb 6.4 oz (74.1 kg)  02/11/24 176 lb (79.8 kg)  01/08/24 161 lb 6.4 oz (73.2  kg)     GEN:  Well nourished, well developed in no acute distress HEENT: Normal NECK: No JVD; No carotid bruits LYMPHATICS: No lymphadenopathy CARDIAC: Irregularly irregular, no murmurs, rubs, gallops RESPIRATORY: Diminished breath sounds in the left base and markedly diminished breath sounds throughout the right lung. ABDOMEN: Soft, non-tender, non-distended MUSCULOSKELETAL: 2+ bilateral pretibial and ankle edema; No deformity  SKIN: Warm and dry NEUROLOGIC:  Alert and oriented x 3 PSYCHIATRIC:  Normal affect   Assessment & Plan HFrEF (heart failure with reduced ejection fraction) (HCC) The patient has moderate LV dysfunction with LVEF 35 to 40% without regional wall motion abnormalities.  He is currently on carvedilol  and valsartan .  I discussed GDMT with him today.  I have added Farxiga and low-dose spironolactone to his medical regimen.  He continues to have significant edema and he will remain on furosemide  20 mg daily as well.  I plan on repeating an echocardiogram in about 3 months.  I am hoping that he will maintain sinus rhythm on amiodarone  once he goes through with cardioversion next week. Persistent atrial fibrillation (HCC) Continue amiodarone  and apixaban.  Scheduled for cardioversion next week.  Risks, indications, and alternatives have been reviewed with him and he understands.  He agrees to proceed. Medication management See discussion above Pleural effusion Patient with a markedly abnormal right lung exam with history of large right pleural effusion.  I am going to refer him to pulmonary medicine for further evaluation.            Medication Adjustments/Labs and Tests Ordered: Current medicines are reviewed at length with the patient today.  Concerns regarding medicines are outlined above.  Orders Placed This Encounter  Procedures   Basic metabolic panel with GFR   Ambulatory referral to Pulmonology   ECHOCARDIOGRAM COMPLETE   Meds ordered this encounter   Medications   spironolactone (ALDACTONE) 25 MG tablet    Sig: Take 0.5 tablets (12.5 mg total) by mouth daily.    Dispense:  45 tablet    Refill:  3   dapagliflozin propanediol (FARXIGA) 10 MG TABS tablet    Sig: Take 1 tablet (10 mg total) by mouth daily before breakfast.    Dispense:  30 tablet    Refill:  11    Patient Instructions  Medication Instructions:  START Spironolactone 12.5mg  daily START Farxiga 10mg  daily *If you need a refill on your cardiac medications before your next appointment, please call your pharmacy*  Lab Work: BMET in 2 weeks If you have labs (blood work) drawn today and your tests are completely normal, you will receive your results only by: MyChart Message (if you have MyChart) OR A paper copy in the mail If you have any lab test that is abnormal or we need to change your treatment, we will call you to review the results.  Testing/Procedures: Ambulatory referral to pulmonology  ECHO Your physician has requested that you have an echocardiogram. Echocardiography is a painless test that uses sound waves to create images of your heart. It provides your doctor with information about the size and shape of your heart and how well your heart's chambers and valves are working. This procedure takes approximately one hour. There are no restrictions  for this procedure. Please do NOT wear cologne, perfume, aftershave, or lotions (deodorant is allowed). Please arrive 15 minutes prior to your appointment time.  Please note: We ask at that you not bring children with you during ultrasound (echo/ vascular) testing. Due to room size and safety concerns, children are not allowed in the ultrasound rooms during exams. Our front office staff cannot provide observation of children in our lobby area while testing is being conducted. An adult accompanying a patient to their appointment will only be allowed in the ultrasound room at the discretion of the ultrasound technician under  special circumstances. We apologize for any inconvenience.  Follow-Up: At [facility], you and your health needs are our priority.  As part of our continuing mission to provide you with exceptional heart care, our providers are all part of one team.  This team includes your primary Cardiologist (physician) and Advanced Practice Providers or APPs (Physician Assistants and Nurse Practitioners) who all work together to provide you with the care you need, when you need it.  Your next appointment:   6 week(s)  Provider:   One of our Advanced Practice Providers (APPs): Melita Springer, PA-C  Friddie Jetty, NP Evaline Hill, NP  Theotis Flake, PA-C Lawana Pray, NP  Willis Harter, PA-C Lovette Rud, PA-C  [place], [place] Charles Connor, NP  Marlana Silvan, NP Marcie Sever, PA-C  Laquita Plant, PA-C    Dayna Dunn, PA-C  Marlyse Single, PA-C Palmer Bobo, NP Katlyn West, NP Callie Goodrich, PA-C  Evan Williams, PA-C Sheng Haley, PA-C  Xika Zhao, NP Liane Redman, PA-C      Signed, Arnoldo Lapping, MD  02/26/2024 12:15 PM    [facility]

## 2024-02-26 NOTE — H&P (View-Only) (Signed)
 Cardiology Office Note:    Date:  02/26/2024   ID:  Samuel Porter, DOB 1941/08/28, MRN [id number]  PCP:  [name], MD   [facility] Providers Cardiologist:  Arnoldo Lapping, MD Electrophysiologist:  Will Cortland Ding, MD     Referring MD: [name], MD   Chief Complaint  Patient presents with   Atrial Fibrillation   Shortness of Breath    History of Present Illness:    AOUS MODEL is a 83 y.o. male presenting for follow-up of atrial fibrillation.  I initially saw him December 19, 2023 as a new patient for evaluation of A-fib.  He also has a history of HFrEF.  He underwent cardioversion with early return of atrial fibrillation and was started on amiodarone .  Plans are noted to perform repeat cardioversion with him on amiodarone .  He was also found to have a large pleural effusion and underwent thoracentesis.  He tells me that about 2.5 L were removed from his right lung.  The patient is doing okay at present.  He continues to have some shortness of breath, worse with lying supine and worse with physical activity.  He reports no edema.  He denies cough.  No chest pain or pressure.  He is scheduled for cardioversion next week.  He reports that he has been compliant with apixaban and amiodarone .  He denies any bleeding problems.   Current Medications: Current Meds  Medication Sig   acetaminophen  (TYLENOL ) 500 MG tablet Take 2 tablets (1,000 mg total) by mouth every 8 (eight) hours as needed for mild pain. (Patient taking differently: Take 500 mg by mouth as needed for mild pain (pain score 1-3).)   albuterol  (VENTOLIN  HFA) 108 (90 Base) MCG/ACT inhaler Inhale 2 puffs into the lungs every 4 (four) hours as needed.   amiodarone  (PACERONE ) 200 MG tablet Take 200 mg by mouth daily.   carvedilol  (COREG ) 6.25 MG tablet Take 1 tablet (6.25 mg total) by mouth 2 (two) times daily.   cholecalciferol  (VITAMIN D ) 1000 units tablet Take 1,000 Units by mouth daily.   dapagliflozin  propanediol (FARXIGA) 10 MG TABS tablet Take 1 tablet (10 mg total) by mouth daily before breakfast.   dorzolamide -timolol  (COSOPT ) 22.3-6.8 MG/ML ophthalmic solution Place 1 drop into both eyes 2 (two) times daily.   ELIQUIS 5 MG TABS tablet Take 5 mg by mouth 2 (two) times daily.   finasteride  (PROSCAR ) 5 MG tablet Take 5 mg by mouth daily.   furosemide  (LASIX ) 20 MG tablet Take 1 tablet (20 mg total) by mouth daily.   Multiple Vitamins-Minerals (PRESERVISION AREDS 2) CAPS Take 1 capsule by mouth 2 (two) times daily.   polyethylene glycol (MIRALAX  / GLYCOLAX ) 17 g packet Take 17 g by mouth 2 (two) times daily as needed.   sildenafil (VIAGRA) 25 MG tablet Take 25 mg by mouth as needed for erectile dysfunction.   spironolactone (ALDACTONE) 25 MG tablet Take 0.5 tablets (12.5 mg total) by mouth daily.   valsartan  (DIOVAN ) 40 MG tablet Take 1 tablet (40 mg total) by mouth 2 (two) times daily.   [DISCONTINUED] amiodarone  (PACERONE ) 200 MG tablet Take 1 tablet by mouth twice daily for 1 month and after will decrease to 1 tablet daily     Allergies:   Patient has no known allergies.   ROS:   Please see the history of present illness.    All other systems reviewed and are negative.  EKGs/Labs/Other Studies Reviewed:    The following studies  were reviewed today: Cardiac Studies & Procedures   ______________________________________________________________________________________________     ECHOCARDIOGRAM  ECHOCARDIOGRAM COMPLETE 12/19/2023  Narrative ECHOCARDIOGRAM REPORT    Patient Name:   GREGORIE BEHLER Date of Exam: 12/19/2023 Medical Rec #:  [id number]       Height:       73.0 in Accession #:    [id number]      Weight:       155.0 lb Date of Birth:  1941/08/20        BSA:          1.931 m Patient Age:    82 years        BP:           116/54 mmHg Patient Gender: M               HR:           119 bpm. Exam Location:  [address]  Procedure: 2D Echo, Cardiac Doppler and Color  Doppler (Both Spectral and Color Flow Doppler were utilized during procedure).  Indications:    I48.91* Unspeicified atrial fibrillation  History:        Patient has no prior history of Echocardiogram examinations. Risk Factors:Hypertension.  Sonographer:    Ruta Cousins RDCS Referring Phys: [id number] Karlton Overly  IMPRESSIONS   1. Left ventricular ejection fraction, by estimation, is 35 to 40%. The left ventricle has moderately decreased function. The left ventricle has no regional wall motion abnormalities. Left ventricular diastolic function could not be evaluated. 2. Right ventricular systolic function is moderately reduced. The right ventricular size is normal. There is mildly elevated pulmonary artery systolic pressure. 3. Left atrial size was severely dilated. 4. Right atrial size was moderately dilated. 5. A small pericardial effusion is present. The pericardial effusion is localized near the right atrium. There is no evidence of cardiac tamponade. 6. The mitral valve is degenerative. Trivial mitral valve regurgitation. No evidence of mitral stenosis. 7. The aortic valve is tricuspid. There is mild calcification of the aortic valve. Aortic valve regurgitation is not visualized. No aortic stenosis is present. 8. Aortic dilatation noted. There is borderline dilatation of the aortic root, measuring 38 mm. 9. The inferior vena cava is normal in size with greater than 50% respiratory variability, suggesting right atrial pressure of 3 mmHg.  Conclusion(s)/Recommendation(s): Patient in atrial fibrillation with RVR and moderately reduced biventricular heart function. 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  FINDINGS Left Ventricle: Left ventricular ejection fraction, by estimation, is 35 to 40%. The left ventricle has moderately decreased function. The left ventricle has no regional wall motion abnormalities. The left ventricular internal cavity size was normal in size. There is no  left ventricular hypertrophy. Left ventricular diastolic function could not be evaluated due to atrial fibrillation. Left ventricular diastolic function could not be evaluated.  Right Ventricle: The right ventricular size is normal. No increase in right ventricular wall thickness. Right ventricular systolic function is moderately reduced. There is mildly elevated pulmonary artery systolic pressure. The tricuspid regurgitant velocity is 2.97 m/s, and with an assumed right atrial pressure of 3 mmHg, the estimated right ventricular systolic pressure is 38.3 mmHg.  Left Atrium: Left atrial size was severely dilated.  Right Atrium: Right atrial size was moderately dilated.  Pericardium: A small pericardial effusion is present. The pericardial effusion is localized near the right atrium. There is no evidence of cardiac tamponade.  Mitral Valve: The mitral valve is degenerative in appearance. Trivial mitral valve regurgitation. No evidence of mitral  valve stenosis.  Tricuspid Valve: The tricuspid valve is normal in structure. Tricuspid valve regurgitation is mild . No evidence of tricuspid stenosis.  Aortic Valve: The aortic valve is tricuspid. There is mild calcification of the aortic valve. Aortic valve regurgitation is not visualized. No aortic stenosis is present.  Pulmonic Valve: The pulmonic valve was normal in structure. Pulmonic valve regurgitation is trivial. No evidence of pulmonic stenosis.  Aorta: Aortic dilatation noted. There is borderline dilatation of the aortic root, measuring 38 mm.  Venous: The inferior vena cava is normal in size with greater than 50% respiratory variability, suggesting right atrial pressure of 3 mmHg.  IAS/Shunts: No atrial level shunt detected by color flow Doppler.   LEFT VENTRICLE PLAX 2D LVIDd:         4.77 cm LVIDs:         3.18 cm LV PW:         1.05 cm LV IVS:        1.04 cm LVOT diam:     2.00 cm LV SV:         44 LV SV Index:   23 LVOT Area:      3.14 cm  LV Volumes (MOD) LV vol d, MOD A2C: 110.0 ml LV vol d, MOD A4C: 121.0 ml LV vol s, MOD A2C: 61.6 ml LV vol s, MOD A4C: 65.4 ml LV SV MOD A2C:     48.4 ml LV SV MOD A4C:     121.0 ml LV SV MOD BP:      53.0 ml  RIGHT VENTRICLE TAPSE (M-mode): 2.2 cm  LEFT ATRIUM             Index        RIGHT ATRIUM           Index LA diam:        5.10 cm 2.64 cm/m   RA Area:     28.60 cm LA Vol (A2C):   69.7 ml 36.10 ml/m  RA Volume:   107.00 ml 55.42 ml/m LA Vol (A4C):   65.3 ml 33.82 ml/m LA Biplane Vol: 71.8 ml 37.19 ml/m AORTIC VALVE LVOT Vmax:   87.20 cm/s LVOT Vmean:  59.633 cm/s LVOT VTI:    0.139 m  AORTA Ao Root diam: 3.80 cm Ao Asc diam:  3.70 cm  TRICUSPID VALVE TR Peak grad:   35.3 mmHg TR Vmax:        297.00 cm/s  SHUNTS Systemic VTI:  0.14 m Systemic Diam: 2.00 cm  Jules Oar MD Electronically signed by Jules Oar MD Signature Date/Time: 12/19/2023/10:38:15 AM    Final          ______________________________________________________________________________________________      EKG:        Recent Labs: 07/11/2023: ALT 15; B Natriuretic Peptide 232.3 07/14/2023: Magnesium 2.1 02/12/2024: BUN 22; Creatinine, Ser 1.07; Hemoglobin 13.4; Platelets 133; Potassium 4.4; Sodium 139  Recent Lipid Panel No results found for: "CHOL", "TRIG", "HDL", "CHOLHDL", "VLDL", "LDLCALC", "LDLDIRECT"   Risk Assessment/Calculations:    CHA2DS2-VASc Score = 4   This indicates a 4.8% annual risk of stroke. The patient's score is based upon: CHF History: 1 HTN History: 1 Diabetes History: 0 Stroke History: 0 Vascular Disease History: 0 Age Score: 2 Gender Score: 0               Physical Exam:    VS:  BP 110/70   Pulse 61   Ht 6\' 1"  (1.854 m)  Wt 163 lb 6.4 oz (74.1 kg)   SpO2 93%   BMI 21.56 kg/m     Wt Readings from Last 3 Encounters:  02/26/24 163 lb 6.4 oz (74.1 kg)  02/11/24 176 lb (79.8 kg)  01/08/24 161 lb 6.4 oz (73.2  kg)     GEN:  Well nourished, well developed in no acute distress HEENT: Normal NECK: No JVD; No carotid bruits LYMPHATICS: No lymphadenopathy CARDIAC: Irregularly irregular, no murmurs, rubs, gallops RESPIRATORY: Diminished breath sounds in the left base and markedly diminished breath sounds throughout the right lung. ABDOMEN: Soft, non-tender, non-distended MUSCULOSKELETAL: 2+ bilateral pretibial and ankle edema; No deformity  SKIN: Warm and dry NEUROLOGIC:  Alert and oriented x 3 PSYCHIATRIC:  Normal affect   Assessment & Plan HFrEF (heart failure with reduced ejection fraction) (HCC) The patient has moderate LV dysfunction with LVEF 35 to 40% without regional wall motion abnormalities.  He is currently on carvedilol  and valsartan .  I discussed GDMT with him today.  I have added Farxiga and low-dose spironolactone to his medical regimen.  He continues to have significant edema and he will remain on furosemide  20 mg daily as well.  I plan on repeating an echocardiogram in about 3 months.  I am hoping that he will maintain sinus rhythm on amiodarone  once he goes through with cardioversion next week. Persistent atrial fibrillation (HCC) Continue amiodarone  and apixaban.  Scheduled for cardioversion next week.  Risks, indications, and alternatives have been reviewed with him and he understands.  He agrees to proceed. Medication management See discussion above Pleural effusion Patient with a markedly abnormal right lung exam with history of large right pleural effusion.  I am going to refer him to pulmonary medicine for further evaluation.            Medication Adjustments/Labs and Tests Ordered: Current medicines are reviewed at length with the patient today.  Concerns regarding medicines are outlined above.  Orders Placed This Encounter  Procedures   Basic metabolic panel with GFR   Ambulatory referral to Pulmonology   ECHOCARDIOGRAM COMPLETE   Meds ordered this encounter   Medications   spironolactone (ALDACTONE) 25 MG tablet    Sig: Take 0.5 tablets (12.5 mg total) by mouth daily.    Dispense:  45 tablet    Refill:  3   dapagliflozin propanediol (FARXIGA) 10 MG TABS tablet    Sig: Take 1 tablet (10 mg total) by mouth daily before breakfast.    Dispense:  30 tablet    Refill:  11    Patient Instructions  Medication Instructions:  START Spironolactone 12.5mg  daily START Farxiga 10mg  daily *If you need a refill on your cardiac medications before your next appointment, please call your pharmacy*  Lab Work: BMET in 2 weeks If you have labs (blood work) drawn today and your tests are completely normal, you will receive your results only by: MyChart Message (if you have MyChart) OR A paper copy in the mail If you have any lab test that is abnormal or we need to change your treatment, we will call you to review the results.  Testing/Procedures: Ambulatory referral to pulmonology  ECHO Your physician has requested that you have an echocardiogram. Echocardiography is a painless test that uses sound waves to create images of your heart. It provides your doctor with information about the size and shape of your heart and how well your heart's chambers and valves are working. This procedure takes approximately one hour. There are no restrictions  for this procedure. Please do NOT wear cologne, perfume, aftershave, or lotions (deodorant is allowed). Please arrive 15 minutes prior to your appointment time.  Please note: We ask at that you not bring children with you during ultrasound (echo/ vascular) testing. Due to room size and safety concerns, children are not allowed in the ultrasound rooms during exams. Our front office staff cannot provide observation of children in our lobby area while testing is being conducted. An adult accompanying a patient to their appointment will only be allowed in the ultrasound room at the discretion of the ultrasound technician under  special circumstances. We apologize for any inconvenience.  Follow-Up: At [facility], you and your health needs are our priority.  As part of our continuing mission to provide you with exceptional heart care, our providers are all part of one team.  This team includes your primary Cardiologist (physician) and Advanced Practice Providers or APPs (Physician Assistants and Nurse Practitioners) who all work together to provide you with the care you need, when you need it.  Your next appointment:   6 week(s)  Provider:   One of our Advanced Practice Providers (APPs): Melita Springer, PA-C  Friddie Jetty, NP Evaline Hill, NP  Theotis Flake, PA-C Lawana Pray, NP  Willis Harter, PA-C Lovette Rud, PA-C  [place], [place] Charles Connor, NP  Marlana Silvan, NP Marcie Sever, PA-C  Laquita Plant, PA-C    Dayna Dunn, PA-C  Marlyse Single, PA-C Palmer Bobo, NP Katlyn West, NP Callie Goodrich, PA-C  Evan Williams, PA-C Sheng Haley, PA-C  Xika Zhao, NP Liane Redman, PA-C      Signed, Arnoldo Lapping, MD  02/26/2024 12:15 PM    [facility]

## 2024-02-26 NOTE — Patient Instructions (Signed)
 Medication Instructions:  START Spironolactone 12.5mg  daily START Farxiga 10mg  daily *If you need a refill on your cardiac medications before your next appointment, please call your pharmacy*  Lab Work: BMET in 2 weeks If you have labs (blood work) drawn today and your tests are completely normal, you will receive your results only by: MyChart Message (if you have MyChart) OR A paper copy in the mail If you have any lab test that is abnormal or we need to change your treatment, we will call you to review the results.  Testing/Procedures: Ambulatory referral to pulmonology  ECHO Your physician has requested that you have an echocardiogram. Echocardiography is a painless test that uses sound waves to create images of your heart. It provides your doctor with information about the size and shape of your heart and how well your heart's chambers and valves are working. This procedure takes approximately one hour. There are no restrictions for this procedure. Please do NOT wear cologne, perfume, aftershave, or lotions (deodorant is allowed). Please arrive 15 minutes prior to your appointment time.  Please note: We ask at that you not bring children with you during ultrasound (echo/ vascular) testing. Due to room size and safety concerns, children are not allowed in the ultrasound rooms during exams. Our front office staff cannot provide observation of children in our lobby area while testing is being conducted. An adult accompanying a patient to their appointment will only be allowed in the ultrasound room at the discretion of the ultrasound technician under special circumstances. We apologize for any inconvenience.  Follow-Up: At [facility], you and your health needs are our priority.  As part of our continuing mission to provide you with exceptional heart care, our providers are all part of one team.  This team includes your primary Cardiologist (physician) and Advanced Practice  Providers or APPs (Physician Assistants and Nurse Practitioners) who all work together to provide you with the care you need, when you need it.  Your next appointment:   6 week(s)  Provider:   One of our Advanced Practice Providers (APPs): Melita Springer, PA-C  Friddie Jetty, NP Evaline Hill, NP  Theotis Flake, PA-C Lawana Pray, NP  Willis Harter, PA-C Lovette Rud, PA-C  [place], [place] Charles Connor, NP  Marlana Silvan, NP Marcie Sever, PA-C  Laquita Plant, PA-C    Dayna Dunn, PA-C  Marlyse Single, PA-C Palmer Bobo, NP Katlyn West, NP Callie Goodrich, PA-C  Evan Williams, PA-C Sheng Haley, PA-C  Xika Zhao, NP Kathleen Johnson, PA-C

## 2024-02-27 ENCOUNTER — Ambulatory Visit (INDEPENDENT_AMBULATORY_CARE_PROVIDER_SITE_OTHER)

## 2024-02-27 ENCOUNTER — Other Ambulatory Visit: Payer: Self-pay

## 2024-02-27 ENCOUNTER — Encounter: Payer: Self-pay | Admitting: Pulmonary Disease

## 2024-02-27 ENCOUNTER — Telehealth: Payer: Self-pay

## 2024-02-27 ENCOUNTER — Ambulatory Visit: Admitting: Pulmonary Disease

## 2024-02-27 VITALS — BP 120/79 | HR 101 | Temp 95.5°F | Ht 73.0 in | Wt 165.0 lb

## 2024-02-27 DIAGNOSIS — I480 Paroxysmal atrial fibrillation: Secondary | ICD-10-CM | POA: Diagnosis not present

## 2024-02-27 DIAGNOSIS — J90 Pleural effusion, not elsewhere classified: Secondary | ICD-10-CM | POA: Diagnosis not present

## 2024-02-27 DIAGNOSIS — R918 Other nonspecific abnormal finding of lung field: Secondary | ICD-10-CM | POA: Diagnosis not present

## 2024-02-27 DIAGNOSIS — C4492 Squamous cell carcinoma of skin, unspecified: Secondary | ICD-10-CM | POA: Diagnosis not present

## 2024-02-27 DIAGNOSIS — R0602 Shortness of breath: Secondary | ICD-10-CM | POA: Diagnosis not present

## 2024-02-27 DIAGNOSIS — J9811 Atelectasis: Secondary | ICD-10-CM | POA: Diagnosis not present

## 2024-02-27 NOTE — Progress Notes (Signed)
 Samuel Porter    [id number]    02/06/1941  Primary Care Physician:Morrow, Thurston Flow, MD  Referring Physician: Arnoldo Lapping, MD [address] [place],  [place] [address]  Chief complaint: Consult for pleural effusion  HPI: 83 y.o. who  has a past medical history of Arthritis, BPH (benign prostatic hyperplasia), Cancer (HCC) (7 yrs ago), Cataracts, bilateral, Glaucoma, Hypertension, and Vitamin D  deficiency.  Discussed the use of AI scribe software for clinical note transcription with the patient, who gave verbal consent to proceed.  History of Present Illness Samuel Porter is an 83 year old male with recurrent skin cancer who presents with pleural effusion.  He has a right-sided pleural effusion, initially identified during a follow-up for recurrent skin cancer. A thoracentesis on February 06, 2024, drained 2.5 liters of fluid, resulting in symptomatic improvement. He is uncertain if the fluid is reaccumulating. Symptoms began with cough and shortness of breath in late January 2025, progressing to pneumonia and atrial fibrillation in March 2025. He was treated with a Z-Pak for pneumonia.  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 A neck dissection and lymph node biopsy in 2024 were negative for malignancy. He completed neoadjuvant Cemiplimab   treatment in July 2024 and is not currently on chemotherapy or experimental medications.  He was diagnosed with atrial fibrillation in March 2025, initially presenting with a heart rate of 140 bpm. Although scheduled for cardioversion, it was not performed. He is followed by an AFib clinic and is scheduled for another cardioversion on Mar 05, 2024. He started on amiodarone  approximately six weeks ago and is on Eliquis.  Recently, he was prescribed Farxiga 10 mg and spironolactone, though he has not yet started these medications.   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  Exposures: No mold, hot tub, Jacuzzi.  No feather pillows or comforters.  On amiodarone  as noted above No h/o chemo/XRT/macrodantin/MTX  No exposure to asbestos, silica or other organic allergens  Smoking history: He has a significant smoking history, 15-pack-year smoker having quit in 1970.  Travel history:He has lived in [place]  since 1980.  Originally from [place] .  Relevant family history: No family history of lung disease   Outpatient Encounter Medications as of 02/27/2024  Medication Sig   acetaminophen  (TYLENOL ) 500 MG tablet Take 2 tablets (1,000 mg total) by mouth every 8 (eight) hours as needed for mild pain. (Patient taking differently: Take 500 mg by mouth as needed for mild pain (pain score 1-3).)   albuterol  (VENTOLIN  HFA) 108 (90 Base) MCG/ACT inhaler Inhale 2 puffs into the lungs every 4 (four) hours as needed.   amiodarone  (PACERONE ) 200 MG tablet Take 200 mg by mouth daily.   carvedilol  (COREG ) 6.25 MG tablet Take 1 tablet (6.25 mg total) by mouth 2 (two) times daily.   cholecalciferol  (VITAMIN D ) 1000 units tablet Take 1,000 Units by mouth daily.   dapagliflozin propanediol (FARXIGA) 10 MG TABS tablet Take 1 tablet (10 mg total) by mouth daily before breakfast.   dapagliflozin propanediol (FARXIGA) 10 MG TABS tablet Take 1 tablet (10 mg total) by mouth daily before breakfast. This is a sample   dorzolamide -timolol  (COSOPT ) 22.3-6.8 MG/ML ophthalmic solution Place 1 drop into both eyes 2 (two) times daily.   ELIQUIS 5 MG TABS tablet Take 5 mg by mouth 2 (two) times daily.   finasteride  (PROSCAR ) 5 MG tablet  Take 5 mg by mouth daily.   furosemide  (LASIX ) 20 MG tablet Take 1 tablet (20 mg total) by mouth daily.   Multiple Vitamins-Minerals (PRESERVISION AREDS 2) CAPS Take 1 capsule by mouth 2 (two) times daily.   polyethylene glycol (MIRALAX  / GLYCOLAX ) 17 g packet Take 17 g by mouth 2 (two) times daily as needed.   sildenafil  (VIAGRA) 25 MG tablet Take 25 mg by mouth as needed for erectile dysfunction.   spironolactone (ALDACTONE) 25 MG tablet Take 0.5 tablets (12.5 mg total) by mouth daily.   valsartan  (DIOVAN ) 40 MG tablet Take 1 tablet (40 mg total) by mouth 2 (two) times daily.   No facility-administered encounter medications on file as of 02/27/2024.    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   Procedure: LEFT TOTAL HIP ARTHROPLASTY ANTERIOR APPROACH;  Surgeon: Liliane Rei, MD;  Location: [facility];  Service: Orthopedics;  Laterality: Left;   VENTRAL HERNIA REPAIR N/A 07/12/2023   Procedure: HERNIA REPAIR VENTRAL;  Surgeon: Stechschulte, Avon Boers, MD;  Location: [facility] OR;  Service: General;  Laterality: N/A;    Family History  Problem Relation Age of Onset   Cancer - Ovarian Sister    Lymphoma Sister     Social History   Socioeconomic History   Marital status: Married    Spouse name: Not on file   Number of children: Not on file   Years of education: Not on file   Highest education level: Not on file  Occupational History   Not on file  Tobacco Use   Smoking status: Former    Current packs/day: 0.00    Average packs/day: 1.5 packs/day for 10.0 years (15.0 ttl pk-yrs)    Types: Cigarettes    Start date: 40    Quit date: 42    Years since quitting: 55.4   Smokeless tobacco: Never  Substance and Sexual Activity   Alcohol use: Not Currently    Comment: 1-2 glass wine per day   Drug use: No   Sexual activity: Not on file  Other Topics Concern   Not on file  Social History  Narrative   Not on file   Social Drivers of Health   Financial Resource Strain: Not on file  Food Insecurity: No Food Insecurity (02/04/2024)   Received from [facility]   Hunger Vital Sign    Worried About Running Out of Food in the Last Year: Never true    Ran Out of Food in the Last Year: Never true  Transportation Needs: No Transportation Needs (02/04/2024)   Received from [facility]   PRAPARE - Transportation    Lack of Transportation (Medical): No    Lack of Transportation (Non-Medical): No  Physical Activity: Not on file  Stress: Not on file  Social Connections: Not on file  Intimate Partner Violence: Not At Risk (11/30/2022)   Humiliation, Afraid, Rape, and Kick questionnaire    Fear of Current  or Ex-Partner: No    Emotionally Abused: No    Physically Abused: No    Sexually Abused: No    Review of systems: Review of Systems  Constitutional: Negative for fever and chills.  HENT: Negative.   Eyes: Negative for blurred vision.  Respiratory: as per HPI  Cardiovascular: Negative for chest pain and palpitations.  Gastrointestinal: Negative for vomiting, diarrhea, blood per rectum. Genitourinary: Negative for dysuria, urgency, frequency and hematuria.  Musculoskeletal: Negative for myalgias, back pain and joint pain.  Skin: Negative for itching and rash.  Neurological: Negative for dizziness, tremors, focal weakness, seizures and loss of consciousness.  Endo/Heme/Allergies: Negative for environmental allergies.  Psychiatric/Behavioral: Negative for depression, suicidal ideas and hallucinations.  All other systems reviewed and are negative.  Physical Exam: Blood pressure 120/79, pulse (!) 101, temperature (!) 95.5 F (35.3 C), temperature source Oral, height 6\' 1"  (1.854 m), weight 165 lb (74.8 kg), SpO2 92%. Gen:      No acute distress HEENT:  EOMI, sclera anicteric Neck:     No masses; no thyromegaly Lungs:    Diminished breath sounds on the right CV:          Regular rate and rhythm; no murmurs Abd:      + bowel sounds; soft, non-tender; no palpable masses, no distension Ext:    No edema; adequate peripheral perfusion Skin:      Warm and dry; no rash Neuro: alert and oriented x 3 Psych: normal mood and affect  Data Reviewed: Imaging: PET scan 11/27/2022 Small pulmonary nodules without PET uptake patchy areas of groundglass, septal thickening throughout the lungs.  No pleural effusion. I have reviewed the images personally.  CT chest 02/04/24 [[facility]] New large volume right pleural effusion with associated complete right middle and right lower lobe as well as partial right upper lobe passive atelectasis with associated leftward shift of the heart and mediastinum.  Overall similar appearance of nonspecific diffuse lung disease with mild mosaic attenuation pattern of groundglass opacification with associated fine reticulonodular opacities.  While a 1.0 cm short axis borderline enlarged subaortic lymph node is unchanged, other previously present lymph nodes are not as well visualized compared to the prior study.   PFTs:   Labs: Pleural fluid studies [[facility]] 02/06/2024 Cell count 494 cells, 53% lymphocytes, 43% monocyte macrophage glucose 113 Cholesterol 51, albumin 1.8, protein 3.6, LDH 95 No malignant cells identified.  Lymphocytes and few monocyte macrophage cells present Fluid culture negative. Assessment & Plan Pleural effusion Right-sided pleural effusion with 2.5 liters of fluid previously drained at [facility] on 4/24.  Findings are new compared to previous PET scan in February 2024.  Absence of malignant cells suggests a non-malignant etiology.  Fluid studies show it is borderline exudative  Differential diagnosis includes post-pneumonia effusion, mild inflammation, or heart failure, though the latter is less likely due to unilateral presentation. Previous immunotherapy (cemiplimab ) unlikely to be the cause as it was discontinued in July last year.  Further evaluation needed to determine fluid reaccumulation. Lung function may be impacted by the effusion, pending further tests once fluid is confirmed to be fully drained.  - Order chest x-ray and ultrasound to assess current fluid status. - Review CT scans from [facility]. - Consider repeat thoracentesis if fluid persists with repeat labs and cytology - Plan for HRCT scan after ensuring complete drainage of fluid.  Patient has some baseline nonspecific groundglass and reticulonodular opacities which appears chronic and will need reevaluation - Perform lung function test after confirming fluid  is fully drained.  Pneumonia Pneumonia treated with azithromycin (Z-Pak) earlier this year. Possible contributor to current pleural effusion. No current signs of active pneumonia.  Atrial fibrillation Under care of an AFib clinic with planned cardioversion on May 22. Recently started on amiodarone  approximately six weeks ago. On eliquis anticoagulation  Squamous cell carcinoma of skin Recurrent squamous cell carcinoma primarily in facial and neck areas. Previously treated with PD-1 inhibitor cemiplimab , with negative lymph node biopsy post-treatment. No current chemotherapy or immunotherapy ongoing.  Recommendations: Consult IR for thoracentesis if substantial fluid is present Will need CT and PFTs after fluid is drained  Phyllis Breeze MD [facility] 02/27/2024, 10:15 AM  CC: Arnoldo Lapping, MD

## 2024-02-27 NOTE — Telephone Encounter (Signed)
 Requested CT from [facility] via powershare.

## 2024-02-27 NOTE — Progress Notes (Signed)
 Bedside ultrasound procedure   Bedside ultrasound performed to evaluate pleural fluid and need for thoracentesis Patient positioned appropriately and ultrasound performed which no fluid collection on the left.  There is a large free-flowing pleural effusion on the right. Plan for ultrasound-guided thoracentesis by IR.  Will need Eliquis to be held prior to procedure.    Talin Feister MD [facility] See Amion for pager  If no response to pager , please call [phone] until 7pm After 7:00 pm call Elink  [phone] 02/27/2024, 10:41 AM

## 2024-02-27 NOTE — Patient Instructions (Signed)
 VISIT SUMMARY:  During your visit, we discussed your right-sided pleural effusion, which was initially identified during a follow-up for your recurrent skin cancer. We also reviewed your history of pneumonia and atrial fibrillation, as well as your past treatments for squamous cell carcinoma of the skin.  YOUR PLAN:  -PLEURAL EFFUSION: A pleural effusion is a buildup of fluid between the tissues that line the lungs and the chest. We will order a chest x-ray and ultrasound to check the current status of the fluid. If the fluid persists, we may consider another thoracentesis. We will also review your CT scans from [facility] and plan for a CT scan after ensuring the fluid is fully drained. A lung function test will be performed once the fluid is confirmed to be fully drained.  -PNEUMONIA: Pneumonia is an infection that inflames the air sacs in one or both lungs. You were treated with azithromycin (Z-Pak) earlier this year, and there are no current signs of active pneumonia.  -ATRIAL FIBRILLATION: Atrial fibrillation is an irregular and often rapid heart rate that can increase the risk of strokes, heart failure, and other heart-related complications. You are under the care of an AFib clinic and have a planned cardioversion on May 22. You started on amiodarone  approximately six weeks ago and are not currently on blood thinners.  -SQUAMOUS CELL CARCINOMA OF SKIN: Squamous cell carcinoma is a common form of skin cancer that develops in the squamous cells that make up the middle and outer layers of the skin. Your recurrent squamous cell carcinoma primarily affects your facial and neck areas. You previously completed neoadjuvant semipalumab treatment, and your lymph node biopsy was negative for malignancy. You are not currently undergoing chemotherapy or immunotherapy.  INSTRUCTIONS:  Please follow up with the AFib clinic for your scheduled cardioversion on May 22. We will also schedule a chest x-ray and ultrasound  to assess the current status of your pleural effusion. If needed, we may plan for another thoracentesis. Additionally, we will review your CT scans from [facility] and plan for a CT scan after ensuring the fluid is fully drained. A lung function test will be performed once the fluid is confirmed to be fully drained.

## 2024-02-28 ENCOUNTER — Telehealth: Payer: Self-pay | Admitting: Pulmonary Disease

## 2024-03-02 ENCOUNTER — Other Ambulatory Visit (HOSPITAL_COMMUNITY)

## 2024-03-02 DIAGNOSIS — I4891 Unspecified atrial fibrillation: Secondary | ICD-10-CM | POA: Diagnosis not present

## 2024-03-02 DIAGNOSIS — J90 Pleural effusion, not elsewhere classified: Secondary | ICD-10-CM | POA: Diagnosis not present

## 2024-03-02 DIAGNOSIS — I10 Essential (primary) hypertension: Secondary | ICD-10-CM | POA: Diagnosis not present

## 2024-03-02 DIAGNOSIS — K4090 Unilateral inguinal hernia, without obstruction or gangrene, not specified as recurrent: Secondary | ICD-10-CM | POA: Diagnosis not present

## 2024-03-03 ENCOUNTER — Telehealth: Payer: Self-pay

## 2024-03-03 ENCOUNTER — Ambulatory Visit: Payer: Self-pay | Admitting: Pulmonary Disease

## 2024-03-03 ENCOUNTER — Ambulatory Visit (HOSPITAL_COMMUNITY)
Admission: RE | Admit: 2024-03-03 | Discharge: 2024-03-03 | Disposition: A | Discharge status: Home / Self Care | Source: Ambulatory Visit | Attending: Radiology | Admitting: Radiology

## 2024-03-03 ENCOUNTER — Ambulatory Visit (HOSPITAL_COMMUNITY)
Admission: RE | Admit: 2024-03-03 | Discharge: 2024-03-03 | Disposition: A | Discharge status: Home / Self Care | Source: Ambulatory Visit | Attending: Pulmonary Disease | Admitting: Pulmonary Disease

## 2024-03-03 DIAGNOSIS — J90 Pleural effusion, not elsewhere classified: Secondary | ICD-10-CM | POA: Insufficient documentation

## 2024-03-03 DIAGNOSIS — J948 Other specified pleural conditions: Secondary | ICD-10-CM | POA: Diagnosis not present

## 2024-03-03 HISTORY — PX: IR THORACENTESIS ASP PLEURAL SPACE W/IMG GUIDE: IMG5380

## 2024-03-03 LAB — BODY FLUID CELL COUNT WITH DIFFERENTIAL
Eos, Fluid: 2 %
Lymphs, Fluid: 88 %
Monocyte-Macrophage-Serous Fluid: 2 % — ABNORMAL LOW (ref 50–90)
Neutrophil Count, Fluid: 8 % (ref 0–25)
Total Nucleated Cell Count, Fluid: 1188 uL — ABNORMAL HIGH (ref 0–1000)

## 2024-03-03 LAB — LACTATE DEHYDROGENASE, PLEURAL OR PERITONEAL FLUID: LD, Fluid: 136 U/L — ABNORMAL HIGH (ref 3–23)

## 2024-03-03 LAB — PROTEIN, PLEURAL OR PERITONEAL FLUID: Total protein, fluid: 3.6 g/dL

## 2024-03-03 LAB — GLUCOSE, PLEURAL OR PERITONEAL FLUID: Glucose, Fluid: 106 mg/dL

## 2024-03-03 MED ORDER — LIDOCAINE HCL 1 % IJ SOLN
20.0000 mL | Freq: Once | INTRAMUSCULAR | Status: AC
  Administered 2024-03-03: 10 mL via INTRADERMAL

## 2024-03-03 MED ORDER — LIDOCAINE HCL 1 % IJ SOLN
INTRAMUSCULAR | Status: AC
  Filled 2024-03-03: qty 20

## 2024-03-03 NOTE — Telephone Encounter (Signed)
 Result Note His thoracentesis is scheduled for today.  Can we make sure that his scheduled CT is done immediately after the thoracentesis this week.   PCC's, please advise. Thanks

## 2024-03-03 NOTE — Telephone Encounter (Signed)
 Dr. Waylan Haggard, please see below message.

## 2024-03-03 NOTE — Procedures (Signed)
 Ultrasound-guided diagnostic and therapeutic right sided thoracentesis performed yielding 1.3 liters of serosanguinous colored fluid. No immediate complications.   Diagnostic fluid was sent to the lab for further analysis. Follow-up chest x-ray pending. EBL is < 2 ml.

## 2024-03-04 ENCOUNTER — Ambulatory Visit
Admission: RE | Admit: 2024-03-04 | Discharge: 2024-03-04 | Disposition: A | Discharge status: Home / Self Care | Source: Ambulatory Visit | Attending: Pulmonary Disease | Admitting: Pulmonary Disease

## 2024-03-04 DIAGNOSIS — L570 Actinic keratosis: Secondary | ICD-10-CM | POA: Diagnosis not present

## 2024-03-04 DIAGNOSIS — Z85828 Personal history of other malignant neoplasm of skin: Secondary | ICD-10-CM | POA: Diagnosis not present

## 2024-03-04 DIAGNOSIS — D692 Other nonthrombocytopenic purpura: Secondary | ICD-10-CM | POA: Diagnosis not present

## 2024-03-04 DIAGNOSIS — J90 Pleural effusion, not elsewhere classified: Secondary | ICD-10-CM | POA: Diagnosis not present

## 2024-03-04 DIAGNOSIS — I2510 Atherosclerotic heart disease of native coronary artery without angina pectoris: Secondary | ICD-10-CM | POA: Diagnosis not present

## 2024-03-04 DIAGNOSIS — C44329 Squamous cell carcinoma of skin of other parts of face: Secondary | ICD-10-CM | POA: Diagnosis not present

## 2024-03-04 DIAGNOSIS — I700 Atherosclerosis of aorta: Secondary | ICD-10-CM | POA: Diagnosis not present

## 2024-03-04 DIAGNOSIS — L578 Other skin changes due to chronic exposure to nonionizing radiation: Secondary | ICD-10-CM | POA: Diagnosis not present

## 2024-03-04 DIAGNOSIS — L821 Other seborrheic keratosis: Secondary | ICD-10-CM | POA: Diagnosis not present

## 2024-03-04 LAB — CYTOLOGY - NON PAP

## 2024-03-04 LAB — ACID FAST SMEAR (AFB, MYCOBACTERIA): Acid Fast Smear: NEGATIVE

## 2024-03-04 NOTE — Progress Notes (Signed)
 Spoke to patient and instructed them to come at 1015  and to be NPO after 0000. Medications reviewed.   Confirmed that patient will have a ride home and someone to stay with them for 24 hours after the procedure.

## 2024-03-04 NOTE — Telephone Encounter (Signed)
 PT states he is all good and assumes it was for CT resched which he is aware of. Thank you for trying.

## 2024-03-04 NOTE — Telephone Encounter (Signed)
 Referral placed 02/26/24

## 2024-03-04 NOTE — Telephone Encounter (Signed)
 Lm x1 for patient.  I do not see record of our office attempting to contact pt.

## 2024-03-04 NOTE — Telephone Encounter (Signed)
 PT is wondering who called him. States no message left. Please call PT to advise who and why. Thanks.

## 2024-03-05 ENCOUNTER — Ambulatory Visit (HOSPITAL_COMMUNITY): Admitting: Anesthesiology

## 2024-03-05 ENCOUNTER — Encounter (HOSPITAL_COMMUNITY)
Admission: RE | Disposition: A | Discharge status: Home / Self Care | Payer: Self-pay | Source: Home / Self Care | Attending: Internal Medicine

## 2024-03-05 ENCOUNTER — Inpatient Hospital Stay
Admission: RE | Admit: 2024-03-05 | Discharge: 2024-03-05 | Disposition: A | Discharge status: Home / Self Care | Payer: Self-pay | Source: Ambulatory Visit | Attending: Pulmonary Disease | Admitting: Pulmonary Disease

## 2024-03-05 ENCOUNTER — Ambulatory Visit (HOSPITAL_BASED_OUTPATIENT_CLINIC_OR_DEPARTMENT_OTHER): Admitting: Anesthesiology

## 2024-03-05 ENCOUNTER — Ambulatory Visit (HOSPITAL_COMMUNITY)
Admission: RE | Admit: 2024-03-05 | Discharge: 2024-03-05 | Disposition: A | Discharge status: Home / Self Care | Attending: Internal Medicine | Admitting: Internal Medicine

## 2024-03-05 ENCOUNTER — Other Ambulatory Visit: Payer: Self-pay

## 2024-03-05 DIAGNOSIS — I5022 Chronic systolic (congestive) heart failure: Secondary | ICD-10-CM | POA: Insufficient documentation

## 2024-03-05 DIAGNOSIS — I110 Hypertensive heart disease with heart failure: Secondary | ICD-10-CM | POA: Insufficient documentation

## 2024-03-05 DIAGNOSIS — J90 Pleural effusion, not elsewhere classified: Secondary | ICD-10-CM

## 2024-03-05 DIAGNOSIS — I10 Essential (primary) hypertension: Secondary | ICD-10-CM

## 2024-03-05 DIAGNOSIS — I4891 Unspecified atrial fibrillation: Secondary | ICD-10-CM

## 2024-03-05 DIAGNOSIS — Z7901 Long term (current) use of anticoagulants: Secondary | ICD-10-CM | POA: Diagnosis not present

## 2024-03-05 DIAGNOSIS — I4819 Other persistent atrial fibrillation: Secondary | ICD-10-CM | POA: Diagnosis not present

## 2024-03-05 DIAGNOSIS — Z87891 Personal history of nicotine dependence: Secondary | ICD-10-CM

## 2024-03-05 HISTORY — PX: CARDIOVERSION: EP1203

## 2024-03-05 MED ORDER — LIDOCAINE 2% (20 MG/ML) 5 ML SYRINGE
INTRAMUSCULAR | Status: DC | PRN
  Administered 2024-03-05: 60 mg via INTRAVENOUS

## 2024-03-05 MED ORDER — SODIUM CHLORIDE 0.9 % IV SOLN: INTRAVENOUS | Status: DC

## 2024-03-05 MED ORDER — PROPOFOL 10 MG/ML IV BOLUS
INTRAVENOUS | Status: DC | PRN
  Administered 2024-03-05: 60 mg via INTRAVENOUS

## 2024-03-05 MED ORDER — EPHEDRINE SULFATE (PRESSORS) 50 MG/ML IJ SOLN
INTRAMUSCULAR | Status: DC | PRN
  Administered 2024-03-05 (×2): 10 mg via INTRAVENOUS

## 2024-03-05 MED ORDER — CARVEDILOL 3.125 MG PO TABS: 3.1250 mg | ORAL_TABLET | Freq: Two times a day (BID) | ORAL | 0 refills | Status: DC

## 2024-03-05 NOTE — Transfer of Care (Signed)
 Immediate Anesthesia Transfer of Care Note  Patient: Samuel Porter  Procedure(s) Performed: CARDIOVERSION  Patient Location: PACU  Anesthesia Type:General  Level of Consciousness: drowsy  Airway & Oxygen Therapy: Patient Spontanous Breathing and Patient connected to nasal cannula oxygen  Post-op Assessment: Report given to RN and Post -op Vital signs reviewed and stable  Post vital signs: Reviewed and stable  Last Vitals:  Vitals Value Taken Time  BP    Temp    Pulse    Resp    SpO2      Last Pain:  Vitals:   03/05/24 1042  TempSrc:   PainSc: 0-No pain         Complications: No notable events documented.

## 2024-03-05 NOTE — Anesthesia Preprocedure Evaluation (Signed)
 Anesthesia Evaluation  Patient identified by MRN, date of birth, ID band Patient awake    Reviewed: Allergy & Precautions, NPO status , Patient's Chart, lab work & pertinent test results  Airway Mallampati: II  TM Distance: >3 FB Neck ROM: Full    Dental no notable dental hx.    Pulmonary former smoker   Pulmonary exam normal        Cardiovascular hypertension,  Rhythm:Irregular Rate:Normal     Neuro/Psych    GI/Hepatic   Endo/Other    Renal/GU      Musculoskeletal   Abdominal Normal abdominal exam  (+)   Peds  Hematology   Anesthesia Other Findings   Reproductive/Obstetrics                             Anesthesia Physical Anesthesia Plan  ASA: 3  Anesthesia Plan: General   Post-op Pain Management:    Induction:   PONV Risk Score and Plan: 1 and Treatment may vary due to age or medical condition  Airway Management Planned: Mask  Additional Equipment: None  Intra-op Plan:   Post-operative Plan:   Informed Consent: I have reviewed the patients History and Physical, chart, labs and discussed the procedure including the risks, benefits and alternatives for the proposed anesthesia with the patient or authorized representative who has indicated his/her understanding and acceptance.     Dental advisory given  Plan Discussed with: CRNA  Anesthesia Plan Comments:        Anesthesia Quick Evaluation

## 2024-03-05 NOTE — CV Procedure (Addendum)
    CARDIOVERSION NOTE  Procedure: Electrical Cardioversion Indications:  Atrial Fibrillation  Procedure Details:  Consent: Risks of procedure as well as the alternatives and risks of each were explained to the (patient/caregiver).  Consent for procedure obtained.  Time Out: Verified patient identification, verified procedure, site/side was marked, verified correct patient position, special equipment/implants available, medications/allergies/relevent history reviewed, required imaging and test results available.  Performed  Patient placed on cardiac monitor, pulse oximetry, supplemental oxygen as necessary.  Sedation given: per anesthesia Pacer pads placed anterior and posterior chest.  Cardioverted 1 time(s).  Cardioverted at 300J biphasic.  Impression: Findings: Post procedure EKG shows: sinus bradycardia Complications: A small skin tear was noted on the anterior chest wall when the pad was removed. This will be bandaged. Patient did tolerate procedure well.  Plan: Successful DCCV to sinus bradycardia with a single 300J biphasic shock. Given bradycardia and hypotension, will decrease carvedilol  to 3.125 mg twice daily - do not take evening dose today and start the new lower dose tomorrow morning.  Time Spent Directly with the Patient:  30 minutes   Hazle Lites, MD, [facility], FNLA, FACP  [facility]  Medical Director of the [facility]  [facility]ovascular Risk Reduction Clinic Diplomate of the American Board of Clinical Lipidology Attending Cardiologist  Direct Dial: [phone]  Fax: [phone]  Website:  [email or web address] [email or web address] [email or web address] 03/05/2024, 11:09 AM

## 2024-03-05 NOTE — Discharge Instructions (Addendum)
 Per Dr. Maximo Spar: - decrease carvedilol  to 1/2 tab of current tablet starting tomorrow morning (03/06/24) OR take 1 tablet of new prescription (3.125mg ). Take twice daily.

## 2024-03-05 NOTE — Anesthesia Postprocedure Evaluation (Signed)
 Anesthesia Post Note  Patient: Samuel Porter  Procedure(s) Performed: CARDIOVERSION     Patient location during evaluation: PACU Anesthesia Type: General Level of consciousness: awake and alert Pain management: pain level controlled Vital Signs Assessment: post-procedure vital signs reviewed and stable Respiratory status: spontaneous breathing, nonlabored ventilation, respiratory function stable and patient connected to nasal cannula oxygen Cardiovascular status: blood pressure returned to baseline and stable Postop Assessment: no apparent nausea or vomiting Anesthetic complications: no   No notable events documented.  Last Vitals:  Vitals:   03/05/24 1210 03/05/24 1230  BP: 101/67 100/67  Pulse: (!) 57 (!) 57  Resp: 17 (!) 21  Temp:    SpO2: 95% 91%    Last Pain:  Vitals:   03/05/24 1210  TempSrc:   PainSc: 0-No pain                 Theotis Flake P Eryn Krejci

## 2024-03-05 NOTE — Telephone Encounter (Signed)
 Spoke to [place] with canopy and requested for imaging to be uploaded.   Routing to Dr. Don Fritter to make aware.

## 2024-03-05 NOTE — Interval H&P Note (Signed)
 History and Physical Interval Note:  03/05/2024 10:48 AM  Samuel Porter  has presented today for surgery, with the diagnosis of afib.  The various methods of treatment have been discussed with the patient and family. After consideration of risks, benefits and other options for treatment, the patient has consented to  Procedure(s): CARDIOVERSION (N/A) as a surgical intervention.  The patient's history has been reviewed, patient examined, no change in status, stable for surgery.  I have reviewed the patient's chart and labs.  Questions were answered to the patient's satisfaction.     Hazle Lites

## 2024-03-06 ENCOUNTER — Encounter (HOSPITAL_COMMUNITY): Payer: Self-pay | Admitting: Internal Medicine

## 2024-03-06 LAB — BODY FLUID CULTURE W GRAM STAIN
Culture: NO GROWTH
Gram Stain: NONE SEEN

## 2024-03-12 ENCOUNTER — Encounter: Payer: Self-pay | Admitting: Hematology and Oncology

## 2024-03-12 DIAGNOSIS — I5020 Unspecified systolic (congestive) heart failure: Secondary | ICD-10-CM | POA: Diagnosis not present

## 2024-03-12 DIAGNOSIS — Z79899 Other long term (current) drug therapy: Secondary | ICD-10-CM | POA: Diagnosis not present

## 2024-03-12 LAB — BASIC METABOLIC PANEL WITH GFR
BUN/Creatinine Ratio: 17 (ref 10–24)
BUN: 18 mg/dL (ref 8–27)
CO2: 26 mmol/L (ref 20–29)
Calcium: 8.7 mg/dL (ref 8.6–10.2)
Chloride: 102 mmol/L (ref 96–106)
Creatinine, Ser: 1.06 mg/dL (ref 0.76–1.27)
Glucose: 114 mg/dL — ABNORMAL HIGH (ref 70–99)
Potassium: 4.2 mmol/L (ref 3.5–5.2)
Sodium: 141 mmol/L (ref 134–144)
eGFR: 70 mL/min/{1.73_m2} (ref >59)

## 2024-03-13 ENCOUNTER — Ambulatory Visit: Payer: Self-pay | Admitting: Cardiovascular Disease

## 2024-03-13 NOTE — Telephone Encounter (Signed)
 FYI

## 2024-03-16 ENCOUNTER — Telehealth: Payer: Self-pay | Admitting: *Deleted

## 2024-03-16 ENCOUNTER — Encounter (HOSPITAL_BASED_OUTPATIENT_CLINIC_OR_DEPARTMENT_OTHER): Payer: Self-pay | Admitting: Internal Medicine

## 2024-03-16 ENCOUNTER — Ambulatory Visit: Payer: Self-pay | Admitting: Pulmonary Disease

## 2024-03-16 ENCOUNTER — Other Ambulatory Visit

## 2024-03-16 ENCOUNTER — Ambulatory Visit: Admitting: Adult Health

## 2024-03-16 ENCOUNTER — Encounter: Payer: Self-pay | Admitting: Adult Health

## 2024-03-16 ENCOUNTER — Inpatient Hospital Stay (HOSPITAL_BASED_OUTPATIENT_CLINIC_OR_DEPARTMENT_OTHER)
Admission: EM | Admit: 2024-03-16 | Discharge: 2024-03-27 | DRG: 273 | Disposition: A | Discharge status: Home Health | Attending: Internal Medicine | Admitting: Internal Medicine

## 2024-03-16 ENCOUNTER — Other Ambulatory Visit: Payer: Self-pay

## 2024-03-16 ENCOUNTER — Ambulatory Visit (INDEPENDENT_AMBULATORY_CARE_PROVIDER_SITE_OTHER)

## 2024-03-16 VITALS — BP 137/83 | HR 113 | Temp 97.5°F | Ht 73.0 in | Wt 166.6 lb

## 2024-03-16 DIAGNOSIS — Z85828 Personal history of other malignant neoplasm of skin: Secondary | ICD-10-CM

## 2024-03-16 DIAGNOSIS — I5022 Chronic systolic (congestive) heart failure: Secondary | ICD-10-CM | POA: Diagnosis not present

## 2024-03-16 DIAGNOSIS — J984 Other disorders of lung: Secondary | ICD-10-CM | POA: Diagnosis not present

## 2024-03-16 DIAGNOSIS — I5082 Biventricular heart failure: Secondary | ICD-10-CM | POA: Diagnosis not present

## 2024-03-16 DIAGNOSIS — I5023 Acute on chronic systolic (congestive) heart failure: Secondary | ICD-10-CM | POA: Diagnosis not present

## 2024-03-16 DIAGNOSIS — J849 Interstitial pulmonary disease, unspecified: Secondary | ICD-10-CM | POA: Diagnosis not present

## 2024-03-16 DIAGNOSIS — E8809 Other disorders of plasma-protein metabolism, not elsewhere classified: Secondary | ICD-10-CM

## 2024-03-16 DIAGNOSIS — E8779 Other fluid overload: Secondary | ICD-10-CM | POA: Diagnosis not present

## 2024-03-16 DIAGNOSIS — Z96643 Presence of artificial hip joint, bilateral: Secondary | ICD-10-CM | POA: Diagnosis present

## 2024-03-16 DIAGNOSIS — R627 Adult failure to thrive: Secondary | ICD-10-CM | POA: Diagnosis not present

## 2024-03-16 DIAGNOSIS — M1990 Unspecified osteoarthritis, unspecified site: Secondary | ICD-10-CM | POA: Diagnosis present

## 2024-03-16 DIAGNOSIS — I493 Ventricular premature depolarization: Secondary | ICD-10-CM | POA: Diagnosis present

## 2024-03-16 DIAGNOSIS — I428 Other cardiomyopathies: Secondary | ICD-10-CM | POA: Diagnosis not present

## 2024-03-16 DIAGNOSIS — Z7984 Long term (current) use of oral hypoglycemic drugs: Secondary | ICD-10-CM

## 2024-03-16 DIAGNOSIS — J8410 Pulmonary fibrosis, unspecified: Secondary | ICD-10-CM | POA: Diagnosis not present

## 2024-03-16 DIAGNOSIS — J8489 Other specified interstitial pulmonary diseases: Secondary | ICD-10-CM | POA: Diagnosis present

## 2024-03-16 DIAGNOSIS — Z9221 Personal history of antineoplastic chemotherapy: Secondary | ICD-10-CM

## 2024-03-16 DIAGNOSIS — I5020 Unspecified systolic (congestive) heart failure: Secondary | ICD-10-CM | POA: Diagnosis not present

## 2024-03-16 DIAGNOSIS — R091 Pleurisy: Secondary | ICD-10-CM | POA: Diagnosis not present

## 2024-03-16 DIAGNOSIS — Z712 Person consulting for explanation of examination or test findings: Secondary | ICD-10-CM

## 2024-03-16 DIAGNOSIS — Z9889 Other specified postprocedural states: Secondary | ICD-10-CM

## 2024-03-16 DIAGNOSIS — I3139 Other pericardial effusion (noninflammatory): Secondary | ICD-10-CM | POA: Diagnosis not present

## 2024-03-16 DIAGNOSIS — I509 Heart failure, unspecified: Principal | ICD-10-CM

## 2024-03-16 DIAGNOSIS — E46 Unspecified protein-calorie malnutrition: Secondary | ICD-10-CM | POA: Diagnosis not present

## 2024-03-16 DIAGNOSIS — Z9049 Acquired absence of other specified parts of digestive tract: Secondary | ICD-10-CM

## 2024-03-16 DIAGNOSIS — I50811 Acute right heart failure: Secondary | ICD-10-CM | POA: Diagnosis not present

## 2024-03-16 DIAGNOSIS — I2489 Other forms of acute ischemic heart disease: Secondary | ICD-10-CM | POA: Diagnosis not present

## 2024-03-16 DIAGNOSIS — J90 Pleural effusion, not elsewhere classified: Secondary | ICD-10-CM

## 2024-03-16 DIAGNOSIS — J948 Other specified pleural conditions: Secondary | ICD-10-CM | POA: Diagnosis present

## 2024-03-16 DIAGNOSIS — J9600 Acute respiratory failure, unspecified whether with hypoxia or hypercapnia: Secondary | ICD-10-CM | POA: Insufficient documentation

## 2024-03-16 DIAGNOSIS — I071 Rheumatic tricuspid insufficiency: Secondary | ICD-10-CM | POA: Diagnosis not present

## 2024-03-16 DIAGNOSIS — N400 Enlarged prostate without lower urinary tract symptoms: Secondary | ICD-10-CM | POA: Diagnosis present

## 2024-03-16 DIAGNOSIS — I5043 Acute on chronic combined systolic (congestive) and diastolic (congestive) heart failure: Secondary | ICD-10-CM

## 2024-03-16 DIAGNOSIS — E43 Unspecified severe protein-calorie malnutrition: Secondary | ICD-10-CM | POA: Diagnosis not present

## 2024-03-16 DIAGNOSIS — I4891 Unspecified atrial fibrillation: Secondary | ICD-10-CM | POA: Diagnosis not present

## 2024-03-16 DIAGNOSIS — H409 Unspecified glaucoma: Secondary | ICD-10-CM | POA: Diagnosis not present

## 2024-03-16 DIAGNOSIS — I130 Hypertensive heart and chronic kidney disease with heart failure and stage 1 through stage 4 chronic kidney disease, or unspecified chronic kidney disease: Principal | ICD-10-CM | POA: Diagnosis present

## 2024-03-16 DIAGNOSIS — I480 Paroxysmal atrial fibrillation: Secondary | ICD-10-CM

## 2024-03-16 DIAGNOSIS — E8770 Fluid overload, unspecified: Secondary | ICD-10-CM

## 2024-03-16 DIAGNOSIS — R768 Other specified abnormal immunological findings in serum: Secondary | ICD-10-CM | POA: Diagnosis not present

## 2024-03-16 DIAGNOSIS — R0600 Dyspnea, unspecified: Secondary | ICD-10-CM | POA: Diagnosis not present

## 2024-03-16 DIAGNOSIS — R54 Age-related physical debility: Secondary | ICD-10-CM | POA: Diagnosis present

## 2024-03-16 DIAGNOSIS — K0889 Other specified disorders of teeth and supporting structures: Secondary | ICD-10-CM | POA: Diagnosis present

## 2024-03-16 DIAGNOSIS — H269 Unspecified cataract: Secondary | ICD-10-CM | POA: Diagnosis present

## 2024-03-16 DIAGNOSIS — E063 Autoimmune thyroiditis: Secondary | ICD-10-CM | POA: Diagnosis present

## 2024-03-16 DIAGNOSIS — I272 Pulmonary hypertension, unspecified: Secondary | ICD-10-CM | POA: Diagnosis present

## 2024-03-16 DIAGNOSIS — N179 Acute kidney failure, unspecified: Secondary | ICD-10-CM | POA: Diagnosis present

## 2024-03-16 DIAGNOSIS — J9601 Acute respiratory failure with hypoxia: Secondary | ICD-10-CM | POA: Diagnosis not present

## 2024-03-16 DIAGNOSIS — I959 Hypotension, unspecified: Secondary | ICD-10-CM | POA: Diagnosis present

## 2024-03-16 DIAGNOSIS — E038 Other specified hypothyroidism: Secondary | ICD-10-CM

## 2024-03-16 DIAGNOSIS — R64 Cachexia: Secondary | ICD-10-CM | POA: Diagnosis present

## 2024-03-16 DIAGNOSIS — R188 Other ascites: Secondary | ICD-10-CM | POA: Diagnosis present

## 2024-03-16 DIAGNOSIS — M6250 Muscle wasting and atrophy, not elsewhere classified, unspecified site: Secondary | ICD-10-CM | POA: Diagnosis present

## 2024-03-16 DIAGNOSIS — R635 Abnormal weight gain: Secondary | ICD-10-CM | POA: Diagnosis present

## 2024-03-16 DIAGNOSIS — Z4682 Encounter for fitting and adjustment of non-vascular catheter: Secondary | ICD-10-CM | POA: Diagnosis not present

## 2024-03-16 DIAGNOSIS — I4819 Other persistent atrial fibrillation: Secondary | ICD-10-CM | POA: Diagnosis present

## 2024-03-16 DIAGNOSIS — R7989 Other specified abnormal findings of blood chemistry: Secondary | ICD-10-CM | POA: Diagnosis not present

## 2024-03-16 DIAGNOSIS — R918 Other nonspecific abnormal finding of lung field: Secondary | ICD-10-CM | POA: Diagnosis not present

## 2024-03-16 DIAGNOSIS — Z85820 Personal history of malignant melanoma of skin: Secondary | ICD-10-CM

## 2024-03-16 DIAGNOSIS — Z634 Disappearance and death of family member: Secondary | ICD-10-CM

## 2024-03-16 DIAGNOSIS — I517 Cardiomegaly: Secondary | ICD-10-CM | POA: Diagnosis not present

## 2024-03-16 DIAGNOSIS — J9811 Atelectasis: Secondary | ICD-10-CM | POA: Diagnosis not present

## 2024-03-16 DIAGNOSIS — J939 Pneumothorax, unspecified: Secondary | ICD-10-CM | POA: Diagnosis not present

## 2024-03-16 DIAGNOSIS — N1831 Chronic kidney disease, stage 3a: Secondary | ICD-10-CM | POA: Diagnosis present

## 2024-03-16 DIAGNOSIS — D472 Monoclonal gammopathy: Secondary | ICD-10-CM | POA: Diagnosis not present

## 2024-03-16 DIAGNOSIS — E559 Vitamin D deficiency, unspecified: Secondary | ICD-10-CM | POA: Diagnosis present

## 2024-03-16 DIAGNOSIS — Z87891 Personal history of nicotine dependence: Secondary | ICD-10-CM

## 2024-03-16 DIAGNOSIS — Z6822 Body mass index (BMI) 22.0-22.9, adult: Secondary | ICD-10-CM

## 2024-03-16 DIAGNOSIS — R609 Edema, unspecified: Secondary | ICD-10-CM | POA: Diagnosis not present

## 2024-03-16 DIAGNOSIS — Z807 Family history of other malignant neoplasms of lymphoid, hematopoietic and related tissues: Secondary | ICD-10-CM

## 2024-03-16 DIAGNOSIS — R0602 Shortness of breath: Secondary | ICD-10-CM | POA: Diagnosis not present

## 2024-03-16 DIAGNOSIS — I110 Hypertensive heart disease with heart failure: Secondary | ICD-10-CM | POA: Diagnosis not present

## 2024-03-16 DIAGNOSIS — D6869 Other thrombophilia: Secondary | ICD-10-CM

## 2024-03-16 DIAGNOSIS — Z79899 Other long term (current) drug therapy: Secondary | ICD-10-CM

## 2024-03-16 DIAGNOSIS — Z8041 Family history of malignant neoplasm of ovary: Secondary | ICD-10-CM

## 2024-03-16 DIAGNOSIS — R0989 Other specified symptoms and signs involving the circulatory and respiratory systems: Secondary | ICD-10-CM | POA: Diagnosis not present

## 2024-03-16 DIAGNOSIS — Z7901 Long term (current) use of anticoagulants: Secondary | ICD-10-CM

## 2024-03-16 DIAGNOSIS — Z79890 Hormone replacement therapy (postmenopausal): Secondary | ICD-10-CM

## 2024-03-16 LAB — BASIC METABOLIC PANEL WITH GFR
Anion gap: 11 (ref 5–15)
BUN: 35 mg/dL — ABNORMAL HIGH (ref 8–23)
CO2: 27 mmol/L (ref 22–32)
Calcium: 9.5 mg/dL (ref 8.9–10.3)
Chloride: 102 mmol/L (ref 98–111)
Creatinine, Ser: 1.22 mg/dL (ref 0.61–1.24)
GFR, Estimated: 59 mL/min — ABNORMAL LOW (ref >60)
Glucose, Bld: 109 mg/dL — ABNORMAL HIGH (ref 70–99)
Potassium: 3.8 mmol/L (ref 3.5–5.1)
Sodium: 140 mmol/L (ref 135–145)

## 2024-03-16 LAB — CBC WITH DIFFERENTIAL/PLATELET
Abs Immature Granulocytes: 0.06 10*3/uL (ref 0.00–0.07)
Basophils Absolute: 0 10*3/uL (ref 0.0–0.1)
Basophils Relative: 0 %
Eosinophils Absolute: 0 10*3/uL (ref 0.0–0.5)
Eosinophils Relative: 1 %
HCT: 43.8 % (ref 39.0–52.0)
Hemoglobin: 13.6 g/dL (ref 13.0–17.0)
Immature Granulocytes: 1 %
Lymphocytes Relative: 7 %
Lymphs Abs: 0.6 10*3/uL — ABNORMAL LOW (ref 0.7–4.0)
MCH: 29.2 pg (ref 26.0–34.0)
MCHC: 31.1 g/dL (ref 30.0–36.0)
MCV: 94 fL (ref 80.0–100.0)
Monocytes Absolute: 1 10*3/uL (ref 0.1–1.0)
Monocytes Relative: 12 %
Neutro Abs: 6.1 10*3/uL (ref 1.7–7.7)
Neutrophils Relative %: 79 %
Platelets: 213 10*3/uL (ref 150–400)
RBC: 4.66 MIL/uL (ref 4.22–5.81)
RDW: 14.4 % (ref 11.5–15.5)
WBC: 7.8 10*3/uL (ref 4.0–10.5)
nRBC: 0 % (ref 0.0–0.2)

## 2024-03-16 LAB — COMPREHENSIVE METABOLIC PANEL WITH GFR
ALT: 19 U/L (ref 0–44)
AST: 21 U/L (ref 15–41)
Albumin: 2.6 g/dL — ABNORMAL LOW (ref 3.5–5.0)
Alkaline Phosphatase: 100 U/L (ref 38–126)
Anion gap: 10 (ref 5–15)
BUN: 33 mg/dL — ABNORMAL HIGH (ref 8–23)
CO2: 27 mmol/L (ref 22–32)
Calcium: 8.6 mg/dL — ABNORMAL LOW (ref 8.9–10.3)
Chloride: 102 mmol/L (ref 98–111)
Creatinine, Ser: 1.27 mg/dL — ABNORMAL HIGH (ref 0.61–1.24)
GFR, Estimated: 56 mL/min — ABNORMAL LOW (ref >60)
Glucose, Bld: 123 mg/dL — ABNORMAL HIGH (ref 70–99)
Potassium: 3.7 mmol/L (ref 3.5–5.1)
Sodium: 139 mmol/L (ref 135–145)
Total Bilirubin: 1.1 mg/dL (ref 0.0–1.2)
Total Protein: 6.6 g/dL (ref 6.5–8.1)

## 2024-03-16 LAB — CBC
HCT: 45.9 % (ref 39.0–52.0)
Hemoglobin: 14.1 g/dL (ref 13.0–17.0)
MCH: 29 pg (ref 26.0–34.0)
MCHC: 30.7 g/dL (ref 30.0–36.0)
MCV: 94.3 fL (ref 80.0–100.0)
Platelets: 156 10*3/uL (ref 150–400)
RBC: 4.87 MIL/uL (ref 4.22–5.81)
RDW: 14.7 % (ref 11.5–15.5)
WBC: 8.3 10*3/uL (ref 4.0–10.5)
nRBC: 0 % (ref 0.0–0.2)

## 2024-03-16 LAB — BRAIN NATRIURETIC PEPTIDE: B Natriuretic Peptide: 1203.9 pg/mL — ABNORMAL HIGH (ref 0.0–100.0)

## 2024-03-16 LAB — TROPONIN T, HIGH SENSITIVITY
Troponin T High Sensitivity: 22 ng/L — ABNORMAL HIGH (ref <19)
Troponin T High Sensitivity: 23 ng/L — ABNORMAL HIGH (ref <19)

## 2024-03-16 LAB — MAGNESIUM: Magnesium: 2.3 mg/dL (ref 1.7–2.4)

## 2024-03-16 LAB — PRO BRAIN NATRIURETIC PEPTIDE: Pro Brain Natriuretic Peptide: 7160 pg/mL — ABNORMAL HIGH (ref <300.0)

## 2024-03-16 LAB — PHOSPHORUS: Phosphorus: 4.4 mg/dL (ref 2.5–4.6)

## 2024-03-16 MED ORDER — FENTANYL CITRATE PF 50 MCG/ML IJ SOSY: 12.5000 ug | PREFILLED_SYRINGE | INTRAMUSCULAR | Status: DC | PRN

## 2024-03-16 MED ORDER — POLYETHYLENE GLYCOL 3350 17 G PO PACK
17.0000 g | PACK | Freq: Every day | ORAL | Status: DC | PRN
  Administered 2024-03-19: 17 g via ORAL
  Filled 2024-03-16: qty 1

## 2024-03-16 MED ORDER — DORZOLAMIDE HCL-TIMOLOL MAL 2-0.5 % OP SOLN
1.0000 [drp] | Freq: Two times a day (BID) | OPHTHALMIC | Status: DC
  Administered 2024-03-16 – 2024-03-27 (×22): 1 [drp] via OPHTHALMIC
  Filled 2024-03-16 (×2): qty 10

## 2024-03-16 MED ORDER — HEPARIN (PORCINE) 25000 UT/250ML-% IV SOLN
1250.0000 [IU]/h | INTRAVENOUS | Status: AC
  Administered 2024-03-16: 1100 [IU]/h via INTRAVENOUS
  Administered 2024-03-17: 1250 [IU]/h via INTRAVENOUS
  Filled 2024-03-16 (×2): qty 250

## 2024-03-16 MED ORDER — AMIODARONE HCL IN DEXTROSE 360-4.14 MG/200ML-% IV SOLN
30.0000 mg/h | INTRAVENOUS | Status: AC
  Administered 2024-03-17 – 2024-03-21 (×8): 30 mg/h via INTRAVENOUS
  Filled 2024-03-16 (×8): qty 200

## 2024-03-16 MED ORDER — AMIODARONE HCL IN DEXTROSE 360-4.14 MG/200ML-% IV SOLN
60.0000 mg/h | INTRAVENOUS | Status: AC
  Administered 2024-03-16 (×2): 60 mg/h via INTRAVENOUS
  Filled 2024-03-16 (×2): qty 200

## 2024-03-16 MED ORDER — IPRATROPIUM BROMIDE 0.02 % IN SOLN
0.5000 mg | Freq: Four times a day (QID) | RESPIRATORY_TRACT | Status: DC
  Administered 2024-03-16: 0.5 mg via RESPIRATORY_TRACT
  Filled 2024-03-16 (×3): qty 2.5

## 2024-03-16 MED ORDER — BISACODYL 10 MG RE SUPP
10.0000 mg | Freq: Every day | RECTAL | Status: AC | PRN
Start: 2024-03-16 — End: ?

## 2024-03-16 MED ORDER — ACETAMINOPHEN 325 MG PO TABS: 650.0000 mg | ORAL_TABLET | Freq: Four times a day (QID) | ORAL | Status: DC | PRN

## 2024-03-16 MED ORDER — LEVALBUTEROL HCL 0.63 MG/3ML IN NEBU
0.6300 mg | INHALATION_SOLUTION | Freq: Four times a day (QID) | RESPIRATORY_TRACT | Status: DC
  Administered 2024-03-16: 0.63 mg via RESPIRATORY_TRACT
  Filled 2024-03-16 (×3): qty 3

## 2024-03-16 MED ORDER — DOCUSATE SODIUM 100 MG PO CAPS
100.0000 mg | ORAL_CAPSULE | Freq: Two times a day (BID) | ORAL | Status: DC
  Administered 2024-03-16 – 2024-03-27 (×21): 100 mg via ORAL
  Filled 2024-03-16 (×22): qty 1

## 2024-03-16 MED ORDER — AMIODARONE LOAD VIA INFUSION
150.0000 mg | Freq: Once | INTRAVENOUS | Status: AC
  Administered 2024-03-16: 150 mg via INTRAVENOUS
  Filled 2024-03-16: qty 83.34

## 2024-03-16 MED ORDER — POLYETHYLENE GLYCOL 3350 17 G PO PACK: 17.0000 g | PACK | Freq: Two times a day (BID) | ORAL | Status: DC | PRN

## 2024-03-16 MED ORDER — CARVEDILOL 3.125 MG PO TABS
3.1250 mg | ORAL_TABLET | Freq: Two times a day (BID) | ORAL | Status: DC
  Administered 2024-03-16 – 2024-03-17 (×2): 3.125 mg via ORAL
  Filled 2024-03-16 (×2): qty 1

## 2024-03-16 MED ORDER — ONDANSETRON HCL 4 MG PO TABS: 4.0000 mg | ORAL_TABLET | Freq: Four times a day (QID) | ORAL | Status: DC | PRN

## 2024-03-16 MED ORDER — ONDANSETRON HCL 4 MG/2ML IJ SOLN: 4.0000 mg | Freq: Four times a day (QID) | INTRAMUSCULAR | Status: DC | PRN

## 2024-03-16 MED ORDER — FUROSEMIDE 10 MG/ML IJ SOLN
40.0000 mg | Freq: Once | INTRAMUSCULAR | Status: AC
  Administered 2024-03-16: 40 mg via INTRAVENOUS
  Filled 2024-03-16: qty 4

## 2024-03-16 MED ORDER — ACETAMINOPHEN 650 MG RE SUPP: 650.0000 mg | Freq: Four times a day (QID) | RECTAL | Status: DC | PRN

## 2024-03-16 MED ORDER — VITAMIN D 25 MCG (1000 UNIT) PO TABS
1000.0000 [IU] | ORAL_TABLET | Freq: Every day | ORAL | Status: DC
  Administered 2024-03-17 – 2024-03-27 (×11): 1000 [IU] via ORAL
  Filled 2024-03-16 (×11): qty 1

## 2024-03-16 MED ORDER — HYDRALAZINE HCL 20 MG/ML IJ SOLN: 10.0000 mg | Freq: Four times a day (QID) | INTRAMUSCULAR | Status: DC | PRN

## 2024-03-16 MED ORDER — FUROSEMIDE 10 MG/ML IJ SOLN
40.0000 mg | Freq: Two times a day (BID) | INTRAMUSCULAR | Status: DC
  Administered 2024-03-16: 40 mg via INTRAVENOUS
  Filled 2024-03-16: qty 4

## 2024-03-16 MED ORDER — OXYCODONE HCL 5 MG PO TABS
5.0000 mg | ORAL_TABLET | ORAL | Status: DC | PRN
  Administered 2024-03-22: 5 mg via ORAL
  Filled 2024-03-16: qty 1

## 2024-03-16 NOTE — Progress Notes (Signed)
 PHARMACY - ANTICOAGULATION CONSULT NOTE  Pharmacy Consult for heparin Indication: atrial fibrillation  No Known Allergies  Patient Measurements: Height: 6\' 1"  (185.4 cm) Weight: 75.3 kg (166 lb) IBW/kg (Calculated) : 79.9 HEPARIN DW (KG): 75.3  Vital Signs: Temp: 97.5 F (36.4 C) (06/02 1550) Temp Source: Oral (06/02 1550) BP: 113/102 (06/02 1644) Pulse Rate: 114 (06/02 1644)  Labs: Recent Labs    03/16/24 1159 03/16/24 1238  HGB 14.1  --   HCT 45.9  --   PLT 156  --   CREATININE  --  1.22    Estimated Creatinine Clearance: 49.7 mL/min (by C-G formula based on SCr of 1.22 mg/dL).   Medical History: Past Medical History:  Diagnosis Date   Arthritis    oa   BPH (benign prostatic hyperplasia)    Cancer (HCC) 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   Cataracts, bilateral    Glaucoma    Hypertension    Vitamin D  deficiency       Assessment: 83 yo M on apixaban pta for afib, now on hold for thoracentesis. Last dose 6/2 8am per pt. Pharmacy consulted for heparin.     Goal of Therapy:  Heparin level 0.3-0.7 units/ml aPTT 66-102 seconds Monitor platelets by anticoagulation protocol: Yes   Plan:  Heparin 1100 units/hr, no bolus F/u aPTT until correlates with heparin level  Monitor daily aPTT, heparin level, CBC, signs/symptoms of bleeding  F/u restart apixaban   Dorene Gang, PharmD, BCPS, BCCP Clinical Pharmacist  Please check AMION for all [facility] Pharmacy phone numbers After 10:00 PM, call Main Pharmacy [phone]

## 2024-03-16 NOTE — Telephone Encounter (Signed)
 Chief Complaint: SOB Symptoms: worsening SOB, lower SpO2 levels Frequency: x 2 days Pertinent Negatives: Patient denies fever, URI sx, CP, severe SOB Disposition: [] ED /[] Urgent Care (no appt availability in office) / [x] Appointment(In office/virtual)/ []  [facility]/ [] Home Care/ [] Refused Recommended Disposition /[] [facility]/ []  Follow-up with PCP Additional Notes: Pt daughter Raeanne Bull calling reporting worsening SOB over the weekend. Raeanne Bull reports pt was recently seen at [facility] for "fluid in lungs" and is now having similar sx. Daughter reports SpO2 levels below baseline with increased SOB. Caregiver denies any fever, URI sx, CP, or severe SOB. Scheduled patient per protocol on March 16, 2024. Caregiver verbalized understanding and to call back/go to ED with worsening symptoms.        Copied from CRM [id number]. Topic: Clinical - Red Word Triage >> Mar 16, 2024  8:10 AM Juliana Ocean wrote: Red Word that prompted transfer to Nurse Triage: Daughter, Raeanne Bull calling thinks lungs may be filled up w/ fluid.  This will be the 3rd time, as he is having same sx as when this happened before.  Pt breathing gone from OK to worse Reason for Disposition  [1] MILD difficulty breathing (e.g., minimal/no SOB at rest, SOB with walking, pulse <100) AND [2] NEW-onset or WORSE than normal  Answer Assessment - Initial Assessment Questions E2C2 Pulmonary Triage - Initial Assessment Questions "Chief Complaint (e.g., cough, sob, wheezing, fever, chills, sweat or additional symptoms) *Go to specific symptom protocol after initial questions. SOB Recent OV at [facility] - recent CT scan that showed "fluid in lungs" and pulled 2L fluids pulled off  "How long have symptoms been present?" Over the weekend  Have you tested for COVID or Flu? Note: If not, ask patient if a home test can be taken. If so, instruct patient to call back for positive results. No  MEDICINES:   "Have you used any OTC meds to  help with symptoms?" No If yes, ask "What medications?" N/a  "Have you used your inhalers/maintenance medication?" Yes If yes, "What medications?" unknown  If inhaler, ask "How many puffs and how often?" Note: Review instructions on medication in the chart. See above  OXYGEN: "Do you wear supplemental oxygen?" No If yes, "How many liters are you supposed to use?" N/a  "Do you monitor your oxygen levels?" Yes If yes, "What is your reading (oxygen level) today?" 89-91  "What is your usual oxygen saturation reading?"  (Note: Pulmonary O2 sats should be 90% or greater) 93-95    3. PATTERN "Does the difficult breathing come and go, or has it been constant since it started?"      constant 4. SEVERITY: "How bad is your breathing?" (e.g., mild, moderate, severe)    - MILD: No SOB at rest, mild SOB with walking, speaks normally in sentences, can lie down, no retractions, pulse < 100.    - MODERATE: SOB at rest, SOB with minimal exertion and prefers to sit, cannot lie down flat, speaks in phrases, mild retractions, audible wheezing, pulse 100-120.    - SEVERE: Very SOB at rest, speaks in single words, struggling to breathe, sitting hunched forward, retractions, pulse > 120      Moderate - SOB also at rest 5. RECURRENT SYMPTOM: "Have you had difficulty breathing before?" If Yes, ask: "When was the last time?" and "What happened that time?"      Yes, thoracentesis 6. CARDIAC HISTORY: "Do you have any history of heart disease?" (e.g., heart attack, angina, bypass surgery, angioplasty)  Denies Reports hx of afib 7. LUNG HISTORY: "Do you have any history of lung disease?"  (e.g., pulmonary embolus, asthma, emphysema)     denies 8. CAUSE: "What do you think is causing the breathing problem?"      "Fluid in lungs"  9. OTHER SYMPTOMS: "Do you have any other symptoms? (e.g., dizziness, runny nose, cough, chest pain, fever)     denies  Protocols used: Breathing Difficulty-A-AH

## 2024-03-16 NOTE — Telephone Encounter (Signed)
 Called [facility] (ER) and spoke with Jearlean Mince to let her know that patient is coming via private vehicle.  He declined to go via EMS and be transported to either to EMS or [facility].  Advised that he has worsening Right pleural effusion, new oxygen need and A-fib with RVR.  Patient is aware that he likely needs admission and is aware that if admission is needed he will have to be transported via EMS.  She verbalized understanding.  Nothing further needed.

## 2024-03-16 NOTE — Progress Notes (Signed)
 @Patient  ID: Samuel Porter, male    DOB: 02/22/1941, 83 y.o.   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  Exposures: No mold, hot tub, Jacuzzi.  No feather pillows or comforters.  On amiodarone  as noted above No h/o chemo/XRT/macrodantin/MTX  No exposure to asbestos, silica or other organic allergens  Smoking history: He has a significant smoking history, 15-pack-year smoker having quit in 1970.  Travel history:He has lived in [place]  since 1980.  Originally from [place] .  Relevant family history: No family history of lung disease  Imaging: PET scan 11/27/2022 Small pulmonary nodules without PET uptake patchy areas of groundglass, septal thickening throughout the lungs.  No pleural effusion. I have reviewed the images personally.   CT chest 02/04/24 [[facility]] New large volume right pleural effusion with associated complete right middle and right lower lobe as well as partial right upper lobe passive atelectasis with associated leftward shift of the heart and mediastinum.  Overall similar appearance of nonspecific diffuse lung disease with mild mosaic attenuation pattern of groundglass opacification with associated fine reticulonodular opacities.  While a 1.0 cm short axis borderline enlarged subaortic lymph node is unchanged, other previously present lymph nodes are not as well visualized compared to the prior study.    PFTs:    Labs: Pleural fluid studies [[facility]] 02/06/2024 Cell count  494 cells, 53% lymphocytes, 43% monocyte macrophage glucose 113 Cholesterol 51, albumin 1.8, protein 3.6, LDH 95 No malignant cells identified.  Lymphocytes and few monocyte macrophage cells present Fluid culture negative  03/16/2024 Acute OV ; Pleural effusion, SOB Patient presents for an acute office visit.  Complains that he continues to have increased shortness of breath that is progressively worsening.  Patient was seen last visit 2 weeks ago for pulmonary consult for right pleural effusion.  Last visit patient complained of developing cough and shortness of breath in late January 2025 diagnosed with pneumonia and A-fib in March 2025.  Treated with a Z-Pak for pneumonia.  Followed by cardiology, started on amiodarone , Eliquis, Farxiga , Coreg  and spironolactone .  Underwent cardioversion Mar 05, 2024. 2D echo March 2025 showed decreased EF at 35 to 40%, right ventricular systolic function moderately reduced, right ventricle size normal, mildly elevated pulmonary artery systolic pressure, severely dilated left atrium and moderately dilated right atrium.  Previously had thoracentesis February 06, 2024 fluid analysis borderline exudative.  Cytology negative for malignant cells. Patient was set up for a repeat thoracentesis completed on Mar 03, 2024 with 1.3 L of serosanguineous fluid removed.  Cytology was negative for malignant cells, cultures were negative.  Patient underwent a high-resolution CT chest done Mar 04, 2024 that showed a large partially loculated right pleural effusion with collapse/consolidation in the right middle and right lower lobes slightly improved since February 04, 2024.  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 On arrival to office today, O2 sats 83% on room air, required 3l/m O2 to keep sats >90%. HR 113, irregular. Chest xray shows increased right  pleural  effusion. Legs are more swollen bilaterally, with 5lb weight gain. +orthopnea.  Lives alone, wife passed away last week. Neighbor is with him today in the office.    No Known Allergies  Immunization History  Administered Date(s) Administered   Influenza-Unspecified 08/16/2023   PFIZER Comirnaty (Gray Top)Covid-19 Tri-Sucrose Vaccine 05/05/2021   PFIZER(Purple Top)SARS-COV-2 Vaccination 11/04/2019, 11/22/2019, 10/01/2020    Past Medical History:  Diagnosis Date   Arthritis    oa   BPH (benign prostatic hyperplasia)    Cancer (HCC) 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   Cataracts, bilateral    Glaucoma    Hypertension    Vitamin D  deficiency     Tobacco History: Social History   Tobacco Use  Smoking Status Former   Current packs/day: 0.00   Average packs/day: 1.5 packs/day for 10.0 years (15.0 ttl pk-yrs)   Types: Cigarettes   Start date: 73   Quit date: 63   Years since quitting: 55.4  Smokeless Tobacco Never   Counseling given: Not Answered   Outpatient Medications Prior to Visit  Medication Sig Dispense Refill   acetaminophen  (TYLENOL ) 325 MG tablet Take 650 mg by mouth every 6 (six) hours as needed for moderate pain (pain score 4-6).     albuterol  (VENTOLIN  HFA) 108 (90 Base) MCG/ACT inhaler Inhale 2 puffs into the lungs every 4 (four) hours as needed for shortness of breath.     amiodarone  (PACERONE ) 200 MG tablet Take 200 mg by mouth daily.     carvedilol  (COREG ) 3.125 MG tablet Take 1 tablet (3.125 mg total) by mouth 2 (two) times daily. 60 tablet 0   cholecalciferol  (VITAMIN D ) 1000 units tablet Take 1,000 Units by mouth daily.     dapagliflozin  propanediol (FARXIGA ) 10 MG TABS tablet Take 1 tablet (10 mg total) by mouth daily before breakfast. 30 tablet 11   dorzolamide -timolol  (COSOPT ) 22.3-6.8 MG/ML ophthalmic solution Place 1 drop into both eyes 2 (two) times daily.     ELIQUIS 5 MG TABS tablet Take 5 mg by mouth 2 (two) times daily.      finasteride  (PROSCAR ) 5 MG tablet Take 5 mg by mouth daily.     furosemide  (LASIX ) 20 MG tablet Take 1 tablet (20 mg total) by mouth daily. 90 tablet 3   polyethylene glycol (MIRALAX  / GLYCOLAX ) 17 g packet Take 17 g by mouth 2 (two) times daily as needed.     sildenafil (VIAGRA) 25 MG tablet Take 25 mg by mouth as needed for erectile dysfunction.     spironolactone  (ALDACTONE ) 25 MG tablet Take 0.5 tablets (12.5 mg total) by mouth daily. 45 tablet 3   valsartan  (DIOVAN ) 40 MG tablet Take 1 tablet (40 mg total) by mouth 2 (two) times daily. 180 tablet 3   No facility-administered medications prior to visit.     Review of Systems:   Constitutional:   No  weight loss, night sweats,  Fevers, chills, +fatigue, or  lassitude.  HEENT:   No headaches,  Difficulty swallowing,  Tooth/dental problems, or  Sore throat,                No sneezing, itching, ear ache, nasal congestion, post nasal drip,   CV:  No chest pain,  Orthopnea, PND, +swelling in lower extremities, anasarca, dizziness, palpitations, syncope.   GI  No heartburn, indigestion, abdominal pain, nausea, vomiting, diarrhea, change in bowel habits, loss of appetite, bloody stools.   Resp:   No chest wall deformity  Skin: no rash or lesions.  GU: no dysuria, change in color of urine, no urgency or frequency.  No flank pain, no hematuria   MS:  No joint pain or swelling.  No decreased range of motion.  No back pain.    Physical Exam  BP 137/83 (BP Location: Left Arm, Patient Position: Sitting, Cuff Size: Normal)   Pulse (!) 113   Temp (!) 97.5 F (36.4 C)   Ht 6\' 1"  (1.854 m)   Wt 166 lb 9.6 oz (75.6 kg)   SpO2 96%   BMI 21.98 kg/m   GEN: A/Ox3; pleasant , NAD, elderly , frail,   HEENT:  [place]/AT,   NOSE-clear, THROAT-clear, no lesions, no postnasal drip or exudate noted.   NECK:  Supple w/ fair ROM; no JVD; normal carotid impulses w/o bruits; no thyromegaly or nodules palpated; no lymphadenopathy.    RESP decreased BS  on Right , BB crackles  no accessory muscle use, no dullness to percussion  CARD:  Irreg/Irreg  no m/r/g, 3+ peripheral edema, pulses intact, no cyanosis or clubbing.  GI:   Soft & nt; nml bowel sounds; no organomegaly or masses detected.   Musco: Warm bil, no deformities or joint swelling noted.   Neuro: alert, no focal deficits noted.    Skin: Warm, no lesions or rashes    Lab Results:  CBC    Component Value Date/Time   WBC 5.6 02/12/2024 1151   WBC 9.6 07/14/2023 0440   RBC 4.51 02/12/2024 1151   RBC 4.65 07/14/2023 0440   HGB 13.4 02/12/2024 1151   HCT 40.5 02/12/2024 1151   PLT 133 (L) 02/12/2024 1151   MCV 90 02/12/2024 1151   MCH 29.7 02/12/2024 1151   MCH 29.7 07/14/2023 0440   MCHC 33.1 02/12/2024 1151   MCHC 32.9 07/14/2023 0440   RDW 13.5 02/12/2024 1151   LYMPHSABS 0.9 07/11/2023 1653   MONOABS 1.3 (H) 07/11/2023 1653   EOSABS 0.0 07/11/2023 1653   BASOSABS 0.0 07/11/2023 1653    BMET    Component Value Date/Time   NA 141 03/12/2024 0917   K 4.2 03/12/2024 0917   CL 102 03/12/2024 0917   CO2 26 03/12/2024 0917   GLUCOSE 114 (H) 03/12/2024 0917   GLUCOSE 109 (H) 07/14/2023 0440   BUN 18 03/12/2024 0917   CREATININE 1.06 03/12/2024 0917   CREATININE 0.91 11/01/2022 0954   CALCIUM 8.7 03/12/2024 0917   GFRNONAA >60 07/14/2023 0440   GFRNONAA >60 11/01/2022 0954   GFRAA >60 06/17/2018 0457    BNP    Component Value Date/Time   BNP 232.3 (H) 07/11/2023 1653    ProBNP No results found for: "PROBNP"  Imaging: DG Chest 1 View Result Date: 03/16/2024 CLINICAL DATA:  History of right pleural effusion EXAM: CHEST  1 VIEW COMPARISON:  Chest radiograph dated 03/03/2024 FINDINGS: Dense right lower lung opacity. Patchy left lower lung opacity. Increased large right pleural effusion. No pneumothorax. Right heart border is obscured. No acute osseous abnormality. Right axillary surgical clips. IMPRESSION: 1. Increased large right pleural effusion with  dense right lower lung opacity, likely atelectasis. 2. Patchy left lower lung opacity, likely atelectasis. Electronically Signed   By: Limin  Xu M.D.   On: 03/16/2024 11:30   CT CHEST HIGH RESOLUTION Result Date: 03/12/2024 CLINICAL DATA:  Interstitial lung disease, pleural effusion. EXAM: CT CHEST WITHOUT CONTRAST TECHNIQUE: Multidetector CT imaging of the chest was performed following the standard protocol without intravenous contrast. High resolution imaging of  the lungs, as well as inspiratory and expiratory imaging, was performed. RADIATION DOSE REDUCTION: This exam was performed according to the departmental dose-optimization program which includes automated exposure control, adjustment of the mA and/or kV according to patient size and/or use of iterative reconstruction technique. COMPARISON:  02/04/2024 and PET 11/27/2022. FINDINGS: Cardiovascular: Atherosclerotic calcification of the aorta, aortic valve and coronary arteries. Enlarged pulmonic trunk and heart. No pericardial effusion. Mediastinum/Nodes: Thoracic inlet lymph nodes are not enlarged by CT size criteria. Mediastinal lymph nodes measure up to 11 mm in the low right paratracheal station and are likely reactive in etiology. Hilar regions are difficult to definitively evaluate without IV contrast. No axillary adenopathy. Esophagus is grossly unremarkable. Lungs/Pleura: Large partially loculated right pleural effusion with collapse/consolidation in the right middle and right lower lobes, slightly improved from 02/04/2024. Patchy coarsened ground-glass, interlobular and interlobular septal thickening and traction bronchiectasis. Calcified granulomas. Trace left pleural effusion. Airway is unremarkable. No air trapping. Upper Abdomen: Visualized portions of the liver, adrenal glands, left kidney, spleen, pancreas, stomach and bowel are grossly unremarkable. No upper abdominal adenopathy. Musculoskeletal: Degenerative changes in the spine. Flowing  anterior osteophytosis in the thoracic spine. IMPRESSION: 1. Large partially loculated right pleural effusion with collapse/consolidation in the right middle and right lower lobes, slightly improved from 02/04/2024. 2. Patchy coarsened pulmonary parenchymal ground-glass with interlobular and intralobular septal thickening, similar to 11/27/2022. 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 In the absence of air trapping, fibrotic hypersensitivity pneumonitis is considered less likely. Findings are suggestive of an alternative diagnosis (not UIP) per consensus guidelines: Diagnosis of Idiopathic Pulmonary Fibrosis: An Official ATS/ERS/JRS/ALAT Clinical Practice Guideline. Am Annie Barton Crit Care Med Vol 198, Iss 5, [phone], Jun 15 2017. 3. Trace left pleural effusion. 4. Aortic atherosclerosis (ICD10-I70.0). Coronary artery calcification. 5. Enlarged pulmonic trunk, indicative of pulmonary arterial hypertension. Electronically Signed   By: Shearon Denis M.D.   On: 03/12/2024 15:36   EP STUDY Result Date: 03/05/2024 See surgical note for result.  IR THORACENTESIS ASP PLEURAL SPACE W/IMG GUIDE Result Date: 03/03/2024 INDICATION: 83 year old male. History of skin cancer with recurrent right-sided pleural effusion. Request is for therapeutic and diagnostic right sided thoracentesis EXAM: ULTRASOUND GUIDED RIGHT-SIDED THERAPEUTIC AND DIAGNOSTIC THORACENTESIS MEDICATIONS: Lidocaine  1% 10 mL COMPLICATIONS: None immediate. PROCEDURE: An ultrasound guided thoracentesis was thoroughly discussed with the patient and questions answered. The benefits, risks, alternatives and complications were also discussed. The patient understands and wishes to proceed with the procedure. Written consent was obtained. Ultrasound was performed to localize and mark an adequate pocket of fluid in the right chest. The area was then prepped and draped in the normal sterile fashion. 1%  Lidocaine  was used for local anesthesia. Under ultrasound guidance a 6 Fr Skater-Centesis catheter was introduced. Thoracentesis was performed. The catheter was removed and a dressing applied. FINDINGS: A total of approximately 1.3 L of serosanguineous fluid was removed. Samples were sent to the laboratory as requested by the clinical team. IMPRESSION: Successful ultrasound guided therapeutic and diagnostic right-sided thoracentesis yielding 1.3 L of serosanguineous of pleural fluid. Performed by Reagan Camera NP Electronically Signed   By: Elene Griffes M.D.   On: 03/03/2024 11:03   DG Chest 1 View Result Date: 03/03/2024 CLINICAL DATA:  Status post right thoracentesis.  1.3 L removed. EXAM: CHEST  1 VIEW COMPARISON:  Chest radiograph 02/27/2024 FINDINGS: Pleural fluid along the lateral aspect of the right hemithorax has resolved or decreased. There continues to be opacification  at the right lung base that could represent residual pleural fluid and/or atelectasis/consolidation. Cardiac silhouette appears to be enlarged but stable. Mildly prominent interstitial lung markings could represent mild edema. Trachea is midline. Negative for a pneumothorax. IMPRESSION: 1. Decreased pleural fluid on the right. Negative for a pneumothorax. 2. Persistent opacification at the right lung base. 3. Cardiomegaly and possible mild edema. Electronically Signed   By: Elene Griffes M.D.   On: 03/03/2024 11:01   DG Chest 2 View Result Date: 02/27/2024 CLINICAL DATA:  Short of breath. EXAM: CHEST - 2 VIEW COMPARISON:  07/11/2023.  CT, 02/08/2004. FINDINGS: Cardiac silhouette is normal in size. No convincing mediastinal or left hilar masses. Right hilum is mostly obscured by contiguous opacity. Large right pleural effusion. Associated dependent right lung opacity is suspected to be atelectasis. Left lung demonstrates prominent vascular markings but is otherwise clear. No left pleural effusion. No pneumothorax. Skeletal structures  are intact. IMPRESSION: 1. Large right pleural effusion with associated dependent right lung opacity, consistent with atelectasis. A central obstructing mass or lesion on the right is not excluded. Recommend follow-up chest CT with contrast for further assessment. Electronically Signed   By: Amanda Jungling M.D.   On: 02/27/2024 13:05    Administration History     None           No data to display          No results found for: "NITRICOXIDE"      Assessment & Plan:   Pleural effusion on right Recurrent Pleural effusion s/p thoracentesis x 2. Last tap 03/03/24, Chest xray today shows re-accummulation. Now with decompensated CHF, A Fib w/ RVR-Tachycardia, acute hypoxic respiratory failure -will need hospital admission for management.   Discussed in detail with patient and friend. Recommend EMS transport to [facility] . He declines. Wants to go by private vehicle. Discussed dangers of hypoxia. Also discussed that [facility] for underlying cardiac issues.  Paroxysmal atrial fibrillation (HCC) Recent cardioversion -appears to back in A Fib with RVR -Rate 113. In setting of decompensated CHF. - admission for rate control   CHF (congestive heart failure) (HCC) A/C CHF - 5lb weight gain, worsening LE edema, orthopnea, new O2 requirements - needs admission for management   Acute respiratory failure (HCC) Acute hypoxic respiratory failure- HRCT chest with large right pleural effusion, ILD changes ? Pneumonitis - ? Amiodarone  related.  Will need further evaluation with admission-PCCM consult     I spent   45 minutes dedicated to the care of this patient on the date of this encounter to include pre-visit review of records, face-to-face time with the patient discussing conditions above, post visit ordering of testing, clinical documentation with the electronic health record, making appropriate referrals as documented, and communicating necessary findings to members of the patients care  team.   Roena Clark, NP 03/16/2024

## 2024-03-16 NOTE — Assessment & Plan Note (Signed)
 A/C CHF - 5lb weight gain, worsening LE edema, orthopnea, new O2 requirements - needs admission for management

## 2024-03-16 NOTE — ED Notes (Signed)
 [facility] at bedside

## 2024-03-16 NOTE — Assessment & Plan Note (Signed)
 Recurrent Pleural effusion s/p thoracentesis x 2. Last tap 03/03/24, Chest xray today shows re-accummulation. Now with decompensated CHF, A Fib w/ RVR-Tachycardia, acute hypoxic respiratory failure -will need hospital admission for management.   Discussed in detail with patient and friend. Recommend EMS transport to [facility] . He declines. Wants to go by private vehicle. Discussed dangers of hypoxia. Also discussed that [facility] for underlying cardiac issues.

## 2024-03-16 NOTE — ED Notes (Signed)
 Called to give report.  Floor RN unavailable.  To call when ready

## 2024-03-16 NOTE — ED Notes (Signed)
 ED Provider at bedside.

## 2024-03-16 NOTE — H&P (Signed)
 History and Physical    Patient: Samuel Porter Samuel Porter DOB: [birth year] DOA: 03/16/2024 DOS: the patient was seen and examined on 03/16/2024 PCP: [name], MD  Patient coming from: Home  Chief Complaint:  Chief Complaint  Patient presents with   Shortness of Breath   HPI: Samuel Porter is a 83 y.o. male with medical history significant for but not limited to BPH, glaucoma, hypertension, vitamin D  deficiency, atrial fibrillation on anticoagulation with recent cardioversion about 2 weeks ago, history of pleural effusions and other comorbidities who presents with worsening shortness of breath last 3 days.  Patient states that he started developing lower extremity edema that progressively got worse as well as worsening shortness of breath that started 3 days ago.  He states that he was at home and is monitoring his O2 saturations and his O2 saturation for 3 days were about 95% on room air and subsequently continued to drop over the 3 days.  Patient went to go see his pulmonologist today and is found to be hypoxic on room air so they directly admitted him to hospital for further evaluation.  Patient states that he is short of breath but denied any chest discomfort.  States that he is not able to lay flat and has not been able to lay flat normally as he is not comfortable.  Denies any burning or discomfort in his urine and denies any headaches but states that he tries to prevent himself from getting lightheaded but not standing up too quickly.  He denies any other concerns or complaints [facility] has been asked to admit this patient for atrial fibrillation with RVR, acute on chronic CHF exacerbation as well as recurrent pleural effusion.    Review of Systems: As mentioned in the history of present illness. All other systems reviewed and are negative. Past Medical History:  Diagnosis Date   Arthritis    oa   BPH (benign prostatic hyperplasia)    Cancer (HCC) 7 yrs ago   melanoma removed right  elbow   Cataracts, bilateral    Glaucoma    Hypertension    Vitamin D  deficiency    Past Surgical History:  Procedure Laterality Date   CARDIOVERSION N/A 03/05/2024   Procedure: CARDIOVERSION;  Surgeon: Hazle Lites, MD;  Location: [facility];  Service: Cardiovascular;  Laterality: N/A;   CHOLECYSTECTOMY     colonscopy  2017   IR THORACENTESIS ASP PLEURAL SPACE W/IMG GUIDE  03/03/2024   JOINT REPLACEMENT Right    hip    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   Procedure: LEFT TOTAL HIP ARTHROPLASTY ANTERIOR APPROACH;  Surgeon: Liliane Rei, MD;  Location: [facility];  Service: Orthopedics;  Laterality: Left;   VENTRAL HERNIA REPAIR N/A 07/12/2023   Procedure: HERNIA REPAIR VENTRAL;  Surgeon: Stechschulte, Avon Boers, MD;  Location: [facility];  Service: General;  Laterality: N/A;   Social History:  reports that he quit smoking about 55 years ago. His smoking use included cigarettes. He started smoking about 65 years ago. He has a 15 pack-year smoking history. He has never used smokeless tobacco. He reports that he does not currently use alcohol. He reports that he does not use drugs.  No Known Allergies  Family History  Problem Relation Age of Onset   Cancer - Ovarian Sister    Lymphoma Sister    Prior to Admission medications   Medication Sig Start Date End Date Taking? Authorizing Provider  acetaminophen  (TYLENOL ) 325 MG tablet Take 650 mg  by mouth every 6 (six) hours as needed for moderate pain (pain score 4-6).    [provider]  albuterol  (VENTOLIN  HFA) 108 (90 Base) MCG/ACT inhaler Inhale 2 puffs into the lungs every 4 (four) hours as needed for shortness of breath. 11/22/23   [provider]  amiodarone  (PACERONE ) 200 MG tablet Take 200 mg by mouth daily.    [provider]  carvedilol  (COREG ) 3.125 MG tablet Take 1 tablet (3.125 mg total) by mouth 2 (two) times daily. 03/05/24   Hilty, Aviva Lemmings, MD  cholecalciferol  (VITAMIN D ) 1000 units tablet Take  1,000 Units by mouth daily.    [provider]  dapagliflozin  propanediol (FARXIGA ) 10 MG TABS tablet Take 1 tablet (10 mg total) by mouth daily before breakfast. 02/26/24   Arnoldo Lapping, MD  dorzolamide -timolol  (COSOPT ) 22.3-6.8 MG/ML ophthalmic solution Place 1 drop into both eyes 2 (two) times daily. 07/09/16   [provider]  ELIQUIS 5 MG TABS tablet Take 5 mg by mouth 2 (two) times daily.    [provider]  finasteride  (PROSCAR ) 5 MG tablet Take 5 mg by mouth daily.    [provider]  furosemide  (LASIX ) 20 MG tablet Take 1 tablet (20 mg total) by mouth daily. 02/07/24 05/07/24  Cooper, Michael, MD  polyethylene glycol (MIRALAX  / GLYCOLAX ) 17 g packet Take 17 g by mouth 2 (two) times daily as needed. 07/16/23   Maczis, Michael M, PA-C  sildenafil (VIAGRA) 25 MG tablet Take 25 mg by mouth as needed for erectile dysfunction.    [provider]  spironolactone  (ALDACTONE ) 25 MG tablet Take 0.5 tablets (12.5 mg total) by mouth daily. 02/26/24   Arnoldo Lapping, MD  valsartan  (DIOVAN ) 40 MG tablet Take 1 tablet (40 mg total) by mouth 2 (two) times daily. 12/19/23   Arnoldo Lapping, MD   Physical Exam: Vitals:   03/16/24 1345 03/16/24 1430 03/16/24 1550 03/16/24 1644  BP: (!) 130/100 (!) 119/96  (!) 113/102  Pulse: (!) 107 (!) 42  (!) 114  Resp: (!) 24 14  20   Temp:   (!) 97.5 F (36.4 C)   TempSrc:   Oral   SpO2: 96% 100%  98%  Weight:      Height:       Examination: Physical Exam:  Constitutional: Thin elderly chronically ill-appearing Caucasian male who appears calm Respiratory: Diminished to auscultation bilaterally with some coarse breath sounds worse on the right compared to left and does have some crackles and some slight rhonchi.  No appreciable rales or wheezing.  He has a normal respiratory effort and is not tachypneic or using accessory muscles to breathe but is wearing supplemental oxygen via nasal cannula Cardiovascular: Irregularly  irregular and tachycardic, no murmurs / rubs / gallops. S1 and S2 auscultated.  1-2+ lower extremity pitting edema Abdomen: Soft, non-tender, non-distended. Bowel sounds positive.  GU: Deferred. Musculoskeletal: No clubbing / cyanosis of digits/nails. No joint deformity upper and lower extremities Skin: No rashes, lesions, ulcers on limited skin evaluation. No induration; Warm and dry.  Neurologic: CN 2-12 grossly intact with no focal deficits. Romberg sign and cerebellar reflexes not assessed.  Psychiatric: Normal judgment and insight. Alert and oriented x 3. Normal mood and appropriate affect.   Data Reviewed: Recent Results (from the past 2160 hours)  ECHOCARDIOGRAM COMPLETE     Status: None   Collection Time: 12/19/23  9:56 AM  Result Value Ref Range   S' Lateral 3.18 cm   Single Plane A2C  EF 44.0 %   Single Plane A4C EF 46.0 %   Calc EF 45.7 %   Est EF 35 - 40%   Basic metabolic panel     Status: Abnormal   Collection Time: 12/23/23 11:17 AM  Result Value Ref Range   Glucose 100 (H) 70 - 99 mg/dL   BUN 20 8 - 27 mg/dL   Creatinine, Ser 1.61 (L) 0.76 - 1.27 mg/dL   eGFR 91 >09 UE/AVW/0.98   BUN/Creatinine Ratio 27 (H) 10 - 24   Sodium 142 134 - 144 mmol/L   Potassium 4.6 3.5 - 5.2 mmol/L   Chloride 107 (H) 96 - 106 mmol/L   CO2 24 20 - 29 mmol/L   Calcium 9.2 8.6 - 10.2 mg/dL  CBC     Status: Abnormal   Collection Time: 12/23/23 11:17 AM  Result Value Ref Range   WBC 4.5 3.4 - 10.8 x10E3/uL   RBC 4.60 4.14 - 5.80 x10E6/uL   Hemoglobin 13.6 13.0 - 17.7 g/dL   Hematocrit 11.9 14.7 - 51.0 %   MCV 89 79 - 97 fL   MCH 29.6 26.6 - 33.0 pg   MCHC 33.2 31.5 - 35.7 g/dL   RDW 82.9 56.2 - 13.0 %   Platelets 131 (L) 150 - 450 x10E3/uL  CBC     Status: Abnormal   Collection Time: 02/12/24 11:51 AM  Result Value Ref Range   WBC 5.6 3.4 - 10.8 x10E3/uL   RBC 4.51 4.14 - 5.80 x10E6/uL   Hemoglobin 13.4 13.0 - 17.7 g/dL   Hematocrit 86.5 78.4 - 51.0 %   MCV 90 79 - 97 fL   MCH  29.7 26.6 - 33.0 pg   MCHC 33.1 31.5 - 35.7 g/dL   RDW 69.6 29.5 - 28.4 %   Platelets 133 (L) 150 - 450 x10E3/uL  Basic metabolic panel with GFR     Status: Abnormal   Collection Time: 02/12/24 11:51 AM  Result Value Ref Range   Glucose 88 70 - 99 mg/dL   BUN 22 8 - 27 mg/dL   Creatinine, Ser 1.32 0.76 - 1.27 mg/dL   eGFR 69 >44 WN/UUV/2.53   BUN/Creatinine Ratio 21 10 - 24   Sodium 139 134 - 144 mmol/L   Potassium 4.4 3.5 - 5.2 mmol/L   Chloride 99 96 - 106 mmol/L   CO2 30 (H) 20 - 29 mmol/L   Calcium 9.0 8.6 - 10.2 mg/dL  Cytology - Non PAP;     Status: None   Collection Time: 03/03/24 10:26 AM  Result Value Ref Range   CYTOLOGY - NON GYN      CYTOLOGY - NON PAP CASE: [id number] PATIENT: Wenceslao Haller Non-Gynecological Cytology Report     Clinical History: None provided Specimen Submitted:  A. PLEURAL FLUID, RIGHT, THORACENTESIS:   FINAL MICROSCOPIC DIAGNOSIS: - No malignant cells identified  SPECIMEN ADEQUACY: Satisfactory for evaluation  GROSS: Received is/are: 1000cc's of dark red fluid. (MW:mw) Smears: 0 Concentration Method (Thin Prep): 1 Cell Block: 1 Conventional Additional Studies: N/A     Final Diagnosis performed by Ramey Reinerton, MD.   Electronically signed 03/04/2024 Technical and / or Professional components performed at [facility]. [facility], [address], [place], [place] [address].  Immunohistochemistry Technical component (if applicable) was performed at [facility]. [address], [place], [place] [address].   IMMUNOHISTOCHEMISTRY DISCLAIMER (if applicable): Some of these immunohistochemical stains may have been developed and the pe rformance characteristics  determine by [facility]. Some may not have been cleared or approved by the U.S. Food and Drug Administration. The FDA has determined that such clearance or approval is not necessary. This test is used for clinical purposes. It  should not be regarded as investigational or for research. This laboratory is certified under the Clinical Laboratory Improvement Amendments of 1988 (CLIA-88) as qualified to perform high complexity clinical laboratory testing.  The controls stained appropriately.   IHC stains are performed on formalin fixed, paraffin embedded tissue using a 3,3"diaminobenzidine (DAB) chromogen and Leica Bond Autostainer System. The staining intensity of the nucleus is score manually and is reported as the percentage of tumor cell nuclei demonstrating specific nuclear staining. The specimens are fixed in 10% Neutral Formalin for at least 6 hours and up to 72hrs. These tests are validated on decalcified tissue. Results should b e interpreted with caution given the possibility of false negative results on decalcified specimens. Antibody Clones are as follows ER-clone 46F, PR-clone 16, Ki67- clone MM1. Some of these immunohistochemical stains may have been developed and the performance characteristics determined by [facility].   Lactate dehydrogenase (pleural or peritoneal fluid)     Status: Abnormal   Collection Time: 03/03/24 10:26 AM  Result Value Ref Range   LD, Fluid 136 (H) 3 - 23 U/L    Comment: (NOTE) Results should be evaluated in conjunction with serum values    Fluid Type-FLDH PLEURAL RIGHT LUNG     Comment: Performed at [facility], [address] [place], [place] [address] CORRECTED ON 05/20 AT 1056: PREVIOUSLY REPORTED AS Lung RUL   Body fluid cell count with differential     Status: Abnormal   Collection Time: 03/03/24 10:26 AM  Result Value Ref Range   Fluid Type-FCT PLEURAL RIGHT LUNG     Comment: CORRECTED ON 05/20 AT 1056: PREVIOUSLY REPORTED AS Lung RUL   Color, Fluid AMBER (A) YELLOW   Appearance, Fluid CLOUDY (A) CLEAR   Total Nucleated Cell Count, Fluid 1,188 (H) 0 - 1,000 cu mm   Neutrophil Count, Fluid 8 0 - 25 %   Lymphs, Fluid 88 %    Monocyte-Macrophage-Serous Fluid 2 (L) 50 - 90 %   Eos, Fluid 2 %    Comment: Performed at [facility], [address] [place], [place] [address]  Protein, pleural or peritoneal fluid     Status: None   Collection Time: 03/03/24 10:26 AM  Result Value Ref Range   Total protein, fluid 3.6 g/dL    Comment: (NOTE) No normal range established for this test Results should be evaluated in conjunction with serum values    Fluid Type-FTP PLEURAL RIGHT LUNG     Comment: Performed at [facility], [address], [place], [place] [address] CORRECTED ON 05/20 AT 1056: PREVIOUSLY REPORTED AS Lung RUL   Glucose, pleural or peritoneal fluid     Status: None   Collection Time: 03/03/24 10:26 AM  Result Value Ref Range   Glucose, Fluid 106 mg/dL    Comment: (NOTE) No normal range established for this test Results should be evaluated in conjunction with serum values    Fluid Type-FGLU PLEURAL RIGHT LUNG     Comment: Performed at [facility], [address], [place], [place] [address] CORRECTED ON 05/20 AT 1056: PREVIOUSLY REPORTED AS Lung RUL   Acid Fast Smear (AFB)     Status: None   Collection Time: 03/03/24 10:26 AM  Specimen: Lung, Right Upper Lobe; Pleural Fluid  Result Value Ref Range   AFB Specimen Processing Concentration    Acid Fast Smear Negative     Comment: (NOTE) Performed At: [facility] [address] [place], [place] [id number] Pearlean Botts MD NW:[id number]    Source (AFB) LUNG RIGHT UPPER LOBE     Comment: Performed at [facility], [address], [place], [place] [address]  Body fluid culture w Gram Stain     Status: None   Collection Time: 03/03/24 10:26 AM   Specimen: Lung, Right Upper Lobe; Pleural Fluid  Result Value Ref Range   Specimen Description PLEURAL    Special Requests LUNG, RIGHT UPPER LOBE    Gram Stain NO WBC SEEN NO ORGANISMS SEEN     Culture      NO GROWTH 3 DAYS Performed at [facility], [address] [place], [place] [address]    Report Status 03/06/2024 FINAL   Basic metabolic panel with GFR     Status: Abnormal   Collection Time: 03/12/24  9:17 AM  Result Value Ref Range   Glucose 114 (H) 70 - 99 mg/dL   BUN 18 8 - 27 mg/dL   Creatinine, Ser 2.84 0.76 - 1.27 mg/dL   eGFR 70 >13 KG/MWN/0.27   BUN/Creatinine Ratio 17 10 - 24   Sodium 141 134 - 144 mmol/L   Potassium 4.2 3.5 - 5.2 mmol/L   Chloride 102 96 - 106 mmol/L   CO2 26 20 - 29 mmol/L   Calcium 8.7 8.6 - 10.2 mg/dL  CBC     Status: None   Collection Time: 03/16/24 11:59 AM  Result Value Ref Range   WBC 8.3 4.0 - 10.5 K/uL   RBC 4.87 4.22 - 5.81 MIL/uL   Hemoglobin 14.1 13.0 - 17.0 g/dL   HCT 25.3 66.4 - 40.3 %   MCV 94.3 80.0 - 100.0 fL   MCH 29.0 26.0 - 34.0 pg   MCHC 30.7 30.0 - 36.0 g/dL   RDW 47.4 25.9 - 56.3 %   Platelets 156 150 - 400 K/uL   nRBC 0.0 0.0 - 0.2 %    Comment: Performed at [facility], [address] [place], [place] [address]  Basic metabolic panel with GFR     Status: Abnormal   Collection Time: 03/16/24 12:38 PM  Result Value Ref Range   Sodium 140 135 - 145 mmol/L   Potassium 3.8 3.5 - 5.1 mmol/L   Chloride 102 98 - 111 mmol/L   CO2 27 22 - 32 mmol/L   Glucose, Bld 109 (H) 70 - 99 mg/dL    Comment: Glucose reference range applies only to samples taken after fasting for at least 8 hours.   BUN 35 (H) 8 - 23 mg/dL   Creatinine, Ser 3.32 0.61 - 1.24 mg/dL   Calcium 9.5 8.9 - 95.1 mg/dL   GFR, Estimated 59 (L) >60 mL/min    Comment: (NOTE) Calculated using the CKD-EPI Creatinine Equation (2021)    Anion gap 11 5 - 15    Comment: Performed at [facility], [address] [place], [place] [address]  Pro Brain natriuretic peptide     Status: Abnormal   Collection Time: 03/16/24 12:38 PM  Result Value Ref Range   Pro Brain Natriuretic Peptide 7,160.0 (H) <300.0 pg/mL    Comment: (NOTE) Age Group        Cut-Points    Interpretation  < 50  years     450 pg/mL       NT-proBNP > 450 pg/mL indicates                                ADHF is likely              50 to 75 years  900 pg/mL      NT-proBNP > 900 pg/mL indicates          ADHF is likely  > 75 years      1800 pg/mL     NT-proBNP > 1800 pg/mL indicates          ADHF is likely                           All ages    Results between       Indeterminate. Further clinical             300 and the cut-   information is needed to determine            point for age group   if ADHF is present.                                                             Elecsys proBNP II/ Elecsys proBNP II STAT           Cut-Point                       Interpretation  300 pg/mL                    NT-proBNP <300pg/mL indicates                             ADHF is not likely  Performed at [facility], [address] [place], [place] [address]   Troponin T, High Sensitivity     Status: Abnormal   Collection Time: 03/16/24 12:38 PM  Result Value Ref Range   Troponin T High Sensitivity 22 (H) <19 ng/L    Comment: (NOTE) Biotin concentrations > 1000 ng/mL falsely decrease TnT results.  Serial cardiac troponin measurements are suggested.  Refer to the Links section for chest pain algorithms and additional  guidance. Performed at [facility], [address] [place], [place] [address]   Troponin T, High Sensitivity     Status: Abnormal   Collection Time: 03/16/24  2:08 PM  Result Value Ref Range   Troponin T High Sensitivity 23 (H) <19 ng/L    Comment: (NOTE) Biotin concentrations > 1000 ng/mL falsely decrease TnT results.  Serial cardiac troponin measurements are suggested.  Refer to the Links section for chest pain algorithms and additional  guidance. Performed at [facility], [address], [place], [place] [address]   Brain natriuretic peptide     Status: Abnormal   Collection Time: 03/16/24  7:23 PM  Result Value Ref  Range   B Natriuretic Peptide 1,203.9 (H) 0.0 - 100.0 pg/mL    Comment: Performed at [facility], [address] [place], [place] [address]  CBC with Differential/Platelet     Status: Abnormal   Collection Time: 03/16/24  7:23 PM  Result Value Ref Range   WBC 7.8 4.0 - 10.5 K/uL   RBC 4.66 4.22 - 5.81 MIL/uL   Hemoglobin 13.6 13.0 - 17.0 g/dL   HCT 65.7 84.6 - 96.2 %   MCV 94.0 80.0 - 100.0 fL   MCH 29.2 26.0 - 34.0 pg   MCHC 31.1 30.0 - 36.0 g/dL   RDW 95.2 84.1 - 32.4 %   Platelets 213 150 - 400 K/uL   nRBC 0.0 0.0 - 0.2 %   Neutrophils Relative % 79 %   Neutro Abs 6.1 1.7 - 7.7 K/uL   Lymphocytes Relative 7 %   Lymphs Abs 0.6 (L) 0.7 - 4.0 K/uL   Monocytes Relative 12 %   Monocytes Absolute 1.0 0.1 - 1.0 K/uL   Eosinophils Relative 1 %   Eosinophils Absolute 0.0 0.0 - 0.5 K/uL   Basophils Relative 0 %   Basophils Absolute 0.0 0.0 - 0.1 K/uL   Immature Granulocytes 1 %   Abs Immature Granulocytes 0.06 0.00 - 0.07 K/uL    Comment: Performed at [facility], [address] [place], [place] [address]  Comprehensive metabolic panel     Status: Abnormal   Collection Time: 03/16/24  7:23 PM  Result Value Ref Range   Sodium 139 135 - 145 mmol/L   Potassium 3.7 3.5 - 5.1 mmol/L   Chloride 102 98 - 111 mmol/L   CO2 27 22 - 32 mmol/L   Glucose, Bld 123 (H) 70 - 99 mg/dL    Comment: Glucose reference range applies only to samples taken after fasting for at least 8 hours.   BUN 33 (H) 8 - 23 mg/dL   Creatinine, Ser 7.25 (H) 0.61 - 1.24 mg/dL   Calcium 8.6 (L) 8.9 - 10.3 mg/dL   Total Protein 6.6 6.5 - 8.1 g/dL   Albumin 2.6 (L) 3.5 - 5.0 g/dL   AST 21 15 - 41 U/L   ALT 19 0 - 44 U/L   Alkaline Phosphatase 100 38 - 126 U/L   Total Bilirubin 1.1 0.0 - 1.2 mg/dL   GFR, Estimated 56 (L) >60 mL/min    Comment: (NOTE) Calculated using the CKD-EPI Creatinine Equation (2021)    Anion gap 10 5 - 15    Comment: Performed at [facility], [address],  [place], [place] [address]  Magnesium     Status: None   Collection Time: 03/16/24  7:23 PM  Result Value Ref Range   Magnesium 2.3 1.7 - 2.4 mg/dL    Comment: Performed at [facility], [address] [place], [place] [address]  Phosphorus     Status: None   Collection Time: 03/16/24  7:23 PM  Result Value Ref Range   Phosphorus 4.4 2.5 - 4.6 mg/dL    Comment: Performed at [facility], [address] [place], [place] [address]   EKG: Showed A-fib on my interpretation  Assessment and Plan: No notes have been filed under this hospital service. Service: Hospitalist  Acute respiratory failure with hypoxia in the setting of A-fib with RVR, CHF exacerbation and recurrent right-sided pleural effusion: Admitted to medical telemetry.  Does not wear supplemental oxygen at home.  Likely decompensated in the setting of the pleural effusion and A-fib with RVR with CHF exacerbation.  Start diuresis with IV Lasix  40 mg twice daily.  Already received  a dose of 40 in the ED.  Control his heart rate with amiodarone  given the amiodarone  drip and bolus.  Cardiology been consulted for further evaluation for his A-fib with RVR and his acute on chronic systolic CHF; pulmonary has been consulted for recurrent right-sided pleural effusion and planning a thoracentesis but likely will do this Wednesday as patient needs to have apixaban washout.  Repeat chest x-ray in the a.m. and placed on Xopenex and Atrovent every 6 scheduled for now and order flutter valve and incentive spirometry.  Patient will need a repeat chest x-ray prior to discharge and an ambulatory home O2 screen  Recurrent pleural effusion: Felt symptomatic relief on the last time he had a thoracentesis about a few weeks ago.  Prior fluid studies were done and appeared to be transudative but had a lymphocytic predominance.  Pulmonary consulted as above will undergo thoracentesis once he has had an Eliquis washout.  Will need repeat fluid analysis.   Pulmonary feels that this is suspected to cardiogenic fluid overload but unclear why it is asymmetric.  Paroxysmal A-fib with RVR: Continue with home carvedilol  3.125 mg p.o. twice daily but hold his home amiodarone  and placed on amiodarone  drip.  Unfortunately unable to use Cardizem due to his low EF.  Cardiology be consulted for further evaluation and will need to continue monitor on telemetry.  Substitute his apixaban was heparin drip for now for ease of discontinuation when his thoracentesis is done.  Of note he was recently cardioverted 2 weeks ago and is failed  Acute on chronic systolic CHF / Tachycardic induced cardiomyopathy: Strict I's and O's and daily weights and continue fluid restriction of 1500 mL.  Recent echo done back in March showed an EF of 35 to 40% with moderately decreased function and no regional wall motion abnormalities with the left ventricular diastolic function not being evaluated.  Patient also was noted to have right ventricular systolic function noted as moderately reduced patient will initiated on IV diuresis and will need GDMT but will continue with carvedilol  for now and hold other blood pressure medications and order to adequately diurese him get cardiology been consulted for further evaluation.  Of note his BNP was 1203.9 on admission but his proBNP was 7160.  Elevated troponin: Mildly flattened in setting of A-fib with RVR and acute on chronic CHF.  Likely demand ischemia.  Renal Insuffiencey: BUN/Cr Trend: Recent Labs  Lab 03/12/24 0917 03/16/24 1238 03/16/24 1923  BUN 18 35* 33*  CREATININE 1.06 1.22 1.27*  -Avoid Nephrotoxic Medications and hold home losartan spironolactone  and Farxiga , Contrast Dyes, Hypotension and Dehydration to Ensure Adequate Renal Perfusion and will need to Renally Adjust Meds. CTM and Trend Renal Function carefully and repeat CMP in the AM   Glaucoma: Continue home eyedrops  BPH: Currently holding his home finasteride   Essential  hypertension: Continue with carvedilol  3.125 mg p.o. twice daily but hold his other antihypertensives.  Use IV hydralazine 10 mg every 6 as needed for high blood pressure.  Hypoalbuminemia: Patient's Albumin Level is now 2.6. CTM and Trend and repeat CMP in the AM  Advance Care Planning:   Code Status: Prior FULL CODE  Consults: Pulmonary; Cardiology  Family Communication: No family present @ Bedside  Severity of Illness: The appropriate patient status for this patient is INPATIENT. Inpatient status is judged to be reasonable and necessary in order to provide the required intensity of service to ensure the patient's safety. The patient's presenting symptoms, physical exam findings, and initial radiographic  and laboratory data in the context of their chronic comorbidities is felt to place them at high risk for further clinical deterioration. Furthermore, it is not anticipated that the patient will be medically stable for discharge from the hospital within 2 midnights of admission.   * I certify that at the point of admission it is my clinical judgment that the patient will require inpatient hospital care spanning beyond 2 midnights from the point of admission due to high intensity of service, high risk for further deterioration and high frequency of surveillance required.*  Author: Aura Leeds, DO [facility] 03/16/2024 5:04 PM  For on call review [email or web address].

## 2024-03-16 NOTE — Assessment & Plan Note (Signed)
 Acute hypoxic respiratory failure- HRCT chest with large right pleural effusion, ILD changes ? Pneumonitis - ? Amiodarone  related.  Will need further evaluation with admission-PCCM consult

## 2024-03-16 NOTE — Consult Note (Signed)
 NAME:  Samuel Porter, MRN:  [id number], DOB:  1941/04/28, LOS: 0 ADMISSION DATE:  03/16/2024, CONSULTATION DATE:  03/16/24 REFERRING MD:  TRH, CHIEF COMPLAINT:  DOE   History of Present Illness:  83 year old man history of CHF presented from pulmonary clinic with worsening dyspnea exertion, hypoxemia, lower extremity swelling, and A-fib with RVR concerning for CHF exacerbation with recurrent right greater than left pleural effusion.  Patient about 2 weeks ago had a cardioversion for A-fib.  Presented to pulm clinic today with worsening shortness of breath for few days.  Associated with worsening lower extremity swelling.  He was unaware he was back in A-fib.  Noted to be progressive hypoxemic 89% yesterday.  82% on room air today in clinic.  Chest x-ray obtained showed reaccumulation, worsening right-sided pleural effusion compared to May on my review interpretation.  He states when he is had thoracentesis in the past that does improve his symptoms.  He admits to weight gain and lower extremity swelling as discussed above.  Prior fluid studies are borderline but appear transudative.  Lymphocyte predominance is peculiar and unexpected..  Labs today with markedly elevated pro BNP.  Pertinent  Medical History  CHF with reduced EF A-fib with RVR  Significant Hospital Events: Including procedures, antibiotic start and stop dates in addition to other pertinent events     Interim History / Subjective:    Objective    Blood pressure (!) 113/102, pulse (!) 114, temperature (!) 97.5 F (36.4 C), temperature source Oral, resp. rate 20, height 6\' 1"  (1.854 m), weight 75.3 kg, SpO2 98%.        Intake/Output Summary (Last 24 hours) at 03/16/2024 1737 Last data filed at 03/16/2024 1518 Gross per 24 hour  Intake --  Output 700 ml  Net -700 ml   Filed Weights   03/16/24 1146  Weight: 75.3 kg    Examination: General: Frail elderly gentleman lying in bed HENT: Atraumatic normocephalic  temporal wasting noted Lungs: Normal work of breathing, on nasal cannula Cardiovascular: Tachycardic, irregularly irregular Abdomen: Not distended Extremities: Edema noted Neuro: No focal deficits noted  Resolved problem list   Assessment and Plan   Recurrent right pleural effusion: Previously tapped, relatively bland fluid.  Suspect transudate LDH does not meet criteria, does not have a serum protein to compare but would be borderline.  He has cardiomyopathy reduced EF.  Small left pleural effusion.  Unclear why right is so much larger but certainly it is.  He improves with thoracentesis.  High suspicion related to cardiogenic fluid overload, unclear why asymmetric.  -- Plan for repeat thoracentesis vs chest tube 6/4, hold apixaban and let washout, can reconsider if clinically worsens -- Hold apixaban, recommend heparin GTT for stroke prophylaxis in the setting of A-fib  Acute hypoxemic respiratory failure: Suspect in the setting of volume overload, CHF exacerbation given onset of symptoms dyspnea hypoxemia in concurrence with worsening lower extremity swelling.  Weight is up as well. -- Recommend aggressive IV diuresis per primary, appreciate cardiology assistance  PCCM will follow.  Please contact us  if any decompensation if we need to reconsider timing of thoracentesis.  Best Practice (right click and "Reselect all SmartList Selections" daily)   Per primary  Labs   CBC: Recent Labs  Lab 03/16/24 1159  WBC 8.3  HGB 14.1  HCT 45.9  MCV 94.3  PLT 156    Basic Metabolic Panel: Recent Labs  Lab 03/12/24 0917 03/16/24 1238  NA 141 140  K 4.2 3.8  CL  102 102  CO2 26 27  GLUCOSE 114* 109*  BUN 18 35*  CREATININE 1.06 1.22  CALCIUM 8.7 9.5   GFR: Estimated Creatinine Clearance: 49.7 mL/min (by C-G formula based on SCr of 1.22 mg/dL). Recent Labs  Lab 03/16/24 1159  WBC 8.3    Liver Function Tests: No results for input(s): "AST", "ALT", "ALKPHOS", "BILITOT",  "PROT", "ALBUMIN" in the last 168 hours. No results for input(s): "LIPASE", "AMYLASE" in the last 168 hours. No results for input(s): "AMMONIA" in the last 168 hours.  ABG    Component Value Date/Time   HCO3 38.1 (H) 07/11/2023 1659   TCO2 40 (H) 07/11/2023 1659   O2SAT 54 07/11/2023 1659     Coagulation Profile: No results for input(s): "INR", "PROTIME" in the last 168 hours.  Cardiac Enzymes: No results for input(s): "CKTOTAL", "CKMB", "CKMBINDEX", "TROPONINI" in the last 168 hours.  HbA1C: No results found for: "HGBA1C"  CBG: No results for input(s): "GLUCAP" in the last 168 hours.  Review of Systems:   No chest pain with exertion.  No orthopnea or PND.  Comprehensive review of systems otherwise negative.  Past Medical History:  He,  has a past medical history of Arthritis, BPH (benign prostatic hyperplasia), Cancer (HCC) (7 yrs ago), Cataracts, bilateral, Glaucoma, Hypertension, and Vitamin D  deficiency.   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 His smoking use included cigarettes. He started smoking about 65 years ago. He has a 15 pack-year smoking history. He has never used smokeless tobacco. He reports that he does not  currently use alcohol. He reports that he does not use drugs.   Family History:  His family history includes Cancer - Ovarian in his sister; Lymphoma in his sister.   Allergies No Known Allergies   Home Medications  Prior to Admission medications   Medication Sig Start Date End Date Taking? Authorizing Provider  acetaminophen  (TYLENOL ) 325 MG tablet Take 650 mg by mouth every 6 (six) hours as needed for moderate pain (pain score 4-6).    [provider]  albuterol  (VENTOLIN  HFA) 108 (90 Base) MCG/ACT inhaler Inhale 2 puffs into the lungs every 4 (four) hours as needed for shortness of breath. 11/22/23   [provider]  amiodarone  (PACERONE ) 200 MG tablet Take 200 mg by mouth daily.    [provider]  carvedilol  (COREG ) 3.125 MG tablet Take 1 tablet (3.125 mg total) by mouth 2 (two) times daily. 03/05/24   Hazle Lites, MD  cholecalciferol  (VITAMIN D ) 1000 units tablet Take 1,000 Units by mouth daily.    [provider]  dapagliflozin  propanediol (FARXIGA ) 10 MG TABS tablet Take 1 tablet (10 mg total) by mouth daily  before breakfast. 02/26/24   Arnoldo Lapping, MD  dorzolamide -timolol  (COSOPT ) 22.3-6.8 MG/ML ophthalmic solution Place 1 drop into both eyes 2 (two) times daily. 07/09/16   [provider]  ELIQUIS 5 MG TABS tablet Take 5 mg by mouth 2 (two) times daily.    [provider]  finasteride  (PROSCAR ) 5 MG tablet Take 5 mg by mouth daily.    [provider]  furosemide  (LASIX ) 20 MG tablet Take 1 tablet (20 mg total) by mouth daily. 02/07/24 05/07/24  Cooper, Michael, MD  polyethylene glycol (MIRALAX  / GLYCOLAX ) 17 g packet Take 17 g by mouth 2 (two) times daily as needed. 07/16/23   Maczis, Michael M, PA-C  sildenafil (VIAGRA) 25 MG tablet Take 25 mg by mouth as needed for erectile dysfunction.    [provider]  spironolactone  (ALDACTONE ) 25 MG tablet Take 0.5 tablets (12.5 mg total) by mouth daily. 02/26/24    Arnoldo Lapping, MD  valsartan  (DIOVAN ) 40 MG tablet Take 1 tablet (40 mg total) by mouth 2 (two) times daily. 12/19/23   Arnoldo Lapping, MD     Critical care time: n/a    Guerry Leek, MD See [facility] Foley

## 2024-03-16 NOTE — Patient Instructions (Signed)
 Recommend hospital admission-Go to  ER for evaluation for hospital admit.

## 2024-03-16 NOTE — Assessment & Plan Note (Signed)
 Recent cardioversion -appears to back in A Fib with RVR -Rate 113. In setting of decompensated CHF. - admission for rate control

## 2024-03-16 NOTE — ED Triage Notes (Signed)
 Pt caox4, ambulatory from [facility] c/o SOB x3 days. Staff reported pt with low SpO2 of 82% RA and that pt was placed on 3L O2 via [place] which increased to WNL. CXR done which showed R large pleural effusion. Afib RVR with hx afib.

## 2024-03-16 NOTE — ED Provider Notes (Addendum)
 [facility] [facility]MENT AT [facility] Provider Note   CSN: [id number] Arrival date & time: 03/16/24  1134     History  Chief Complaint  Patient presents with   Shortness of Breath    Samuel Porter is a 83 y.o. male.  83 year old male with history of A-fib on Eliquis along with recurrent right-sided pleural effusion presents from pulmonologist office short of breath.  States that the last 2 days he has had increasing dyspnea exertion.  No fever or cough.  Denies any chest pain.  Does not use oxygen at home.  Went to pulmonology today and had a chest x-ray done there which I can review which shows a recurrent large right-sided pleural effusion.  He has had this treated before in the past with thoracentesis.  Patient also noted to be in A-fib.  He had been cardioverted about 10 days ago.  He has no idea of when his current symptoms started.  Provider at pulmonologist office wanted patient to go by EMS to [facility] patient instead came here by private vehicle       Home Medications Prior to Admission medications   Medication Sig Start Date End Date Taking? Authorizing Provider  acetaminophen  (TYLENOL ) 325 MG tablet Take 650 mg by mouth every 6 (six) hours as needed for moderate pain (pain score 4-6).    [provider]  albuterol  (VENTOLIN  HFA) 108 (90 Base) MCG/ACT inhaler Inhale 2 puffs into the lungs every 4 (four) hours as needed for shortness of breath. 11/22/23   [provider]  amiodarone  (PACERONE ) 200 MG tablet Take 200 mg by mouth daily.    [provider]  carvedilol  (COREG ) 3.125 MG tablet Take 1 tablet (3.125 mg total) by mouth 2 (two) times daily. 03/05/24   Hazle Lites, MD  cholecalciferol  (VITAMIN D ) 1000 units tablet Take 1,000 Units by mouth daily.    [provider]  dapagliflozin  propanediol (FARXIGA ) 10 MG TABS tablet Take 1 tablet (10 mg total) by mouth daily before breakfast. 02/26/24   Arnoldo Lapping, MD  dorzolamide -timolol  (COSOPT ) 22.3-6.8 MG/ML ophthalmic solution Place 1 drop into both eyes 2 (two) times daily. 07/09/16   [provider]  ELIQUIS 5 MG TABS tablet Take 5 mg by mouth 2 (two) times daily.    [provider]  finasteride  (PROSCAR ) 5 MG tablet Take 5 mg by mouth daily.    [provider]  furosemide  (LASIX ) 20 MG tablet Take 1 tablet (20 mg total) by mouth daily. 02/07/24 05/07/24  Arnoldo Lapping, MD  polyethylene glycol (MIRALAX  / GLYCOLAX ) 17 g packet Take 17 g by mouth 2 (two) times daily as needed. 07/16/23   Maczis, Michael M, PA-C  sildenafil (VIAGRA) 25 MG tablet Take 25 mg by mouth as needed for erectile dysfunction.    [provider]  spironolactone  (ALDACTONE ) 25 MG tablet Take 0.5 tablets (12.5 mg total) by mouth daily. 02/26/24   Arnoldo Lapping, MD  valsartan  (DIOVAN ) 40 MG tablet Take 1 tablet (40 mg total) by mouth 2 (two) times daily. 12/19/23   Arnoldo Lapping, MD      Allergies    Patient has no known allergies.    Review of Systems   Review of Systems  All other systems reviewed and are negative.   Physical Exam Updated Vital Signs BP (!) 117/96   Pulse (!) 110   Temp 97.6 F (36.4 C)   Resp (!) 25   Ht 1.854 m (6'  1")   Wt 75.3 kg   SpO2 95%   BMI 21.90 kg/m  Physical Exam Vitals and nursing note reviewed.  Constitutional:      General: He is not in acute distress.    Appearance: Normal appearance. He is well-developed. He is not toxic-appearing.  HENT:     Head: Normocephalic and atraumatic.  Eyes:     General: Lids are normal.     Conjunctiva/sclera: Conjunctivae normal.     Pupils: Pupils are equal, round, and reactive to light.  Neck:     Thyroid : No thyroid  mass.     Trachea: No tracheal deviation.  Cardiovascular:     Rate and Rhythm: Normal rate. Rhythm irregular.     Heart sounds: Normal heart sounds. No murmur heard.    No gallop.  Pulmonary:     Effort: Pulmonary effort is  normal. No respiratory distress.     Breath sounds: No stridor. Examination of the right-middle field reveals decreased breath sounds. Examination of the right-lower field reveals decreased breath sounds. Decreased breath sounds present. No wheezing, rhonchi or rales.  Abdominal:     General: There is no distension.     Palpations: Abdomen is soft.     Tenderness: There is no abdominal tenderness. There is no rebound.  Musculoskeletal:        General: No tenderness. Normal range of motion.     Cervical back: Normal range of motion and neck supple.  Skin:    General: Skin is warm and dry.     Findings: No abrasion or rash.  Neurological:     Mental Status: He is alert and oriented to person, place, and time. Mental status is at baseline.     GCS: GCS eye subscore is 4. GCS verbal subscore is 5. GCS motor subscore is 6.     Cranial Nerves: No cranial nerve deficit.     Sensory: No sensory deficit.     Motor: Motor function is intact.  Psychiatric:        Attention and Perception: Attention normal.        Speech: Speech normal.        Behavior: Behavior normal.     ED Results / Procedures / Treatments   Labs (all labs ordered are listed, but only abnormal results are displayed) Labs Reviewed  CBC  BASIC METABOLIC PANEL WITH GFR  PRO BRAIN NATRIURETIC PEPTIDE  TROPONIN T, HIGH SENSITIVITY    EKG EKG Interpretation Date/Time:  Monday March 16 2024 11:47:56 EDT Ventricular Rate:  108 PR Interval:    QRS Duration:  176 QT Interval:  422 QTC Calculation: 525 R Axis:   264  Text Interpretation: Atrial fibrillation Nonspecific IVCD with LAD Probable anteroseptal infarct, old Baseline wander in lead(s) V4 Confirmed by Lind Repine ([id number]) on 03/16/2024 12:01:56 PM  Radiology DG Chest 1 View Result Date: 03/16/2024 CLINICAL DATA:  History of right pleural effusion EXAM: CHEST  1 VIEW COMPARISON:  Chest radiograph dated 03/03/2024 FINDINGS: Dense right lower lung opacity. Patchy  left lower lung opacity. Increased large right pleural effusion. No pneumothorax. Right heart border is obscured. No acute osseous abnormality. Right axillary surgical clips. IMPRESSION: 1. Increased large right pleural effusion with dense right lower lung opacity, likely atelectasis. 2. Patchy left lower lung opacity, likely atelectasis. Electronically Signed   By: Limin  Xu M.D.   On: 03/16/2024 11:30    Procedures Procedures    Medications Ordered in ED Medications - No data to display  ED Course/ Medical Decision Making/ A&P                                 Medical Decision Making Amount and/or Complexity of Data Reviewed Labs: ordered.  Risk Prescription drug management.   Outpatient chest x-ray shows right-sided pleural effusion.  EKG shows A-fib here.  Rate is variable between high 90s to 120s.  He is on 3 L oxygen by nasal cannula here.  Patient's BNP elevated here.  Given Lasix  40 mg IV push.  No indication for further respiratory support at this time.    1:19 PM Patient reassessed and breathing comfortably.  Will admit to the hospitalist team  CRITICAL CARE Performed by: Eden Goodpasture Total critical care time: 45 minutes Critical care time was exclusive of separately billable procedures and treating other patients. Critical care was necessary to treat or prevent imminent or life-threatening deterioration. Critical care was time spent personally by me on the following activities: development of treatment plan with patient and/or surrogate as well as nursing, discussions with consultants, evaluation of patient's response to treatment, examination of patient, obtaining history from patient or surrogate, ordering and performing treatments and interventions, ordering and review of laboratory studies, ordering and review of radiographic studies, pulse oximetry and re-evaluation of patient's condition.       Final Clinical Impression(s) / ED Diagnoses Final diagnoses:   None    Rx / DC Orders ED Discharge Orders     None         Lind Repine, MD 03/16/24 1217    Lind Repine, MD 03/16/24 1320

## 2024-03-17 ENCOUNTER — Inpatient Hospital Stay (HOSPITAL_COMMUNITY)

## 2024-03-17 DIAGNOSIS — J90 Pleural effusion, not elsewhere classified: Secondary | ICD-10-CM | POA: Diagnosis not present

## 2024-03-17 DIAGNOSIS — I5043 Acute on chronic combined systolic (congestive) and diastolic (congestive) heart failure: Secondary | ICD-10-CM | POA: Diagnosis not present

## 2024-03-17 DIAGNOSIS — J9601 Acute respiratory failure with hypoxia: Secondary | ICD-10-CM | POA: Diagnosis not present

## 2024-03-17 DIAGNOSIS — E8809 Other disorders of plasma-protein metabolism, not elsewhere classified: Secondary | ICD-10-CM | POA: Diagnosis not present

## 2024-03-17 DIAGNOSIS — E8779 Other fluid overload: Secondary | ICD-10-CM | POA: Diagnosis not present

## 2024-03-17 DIAGNOSIS — I4891 Unspecified atrial fibrillation: Secondary | ICD-10-CM

## 2024-03-17 DIAGNOSIS — R7989 Other specified abnormal findings of blood chemistry: Secondary | ICD-10-CM

## 2024-03-17 LAB — CBC WITH DIFFERENTIAL/PLATELET
Abs Immature Granulocytes: 0.03 10*3/uL (ref 0.00–0.07)
Basophils Absolute: 0 10*3/uL (ref 0.0–0.1)
Basophils Relative: 0 %
Eosinophils Absolute: 0 10*3/uL (ref 0.0–0.5)
Eosinophils Relative: 1 %
HCT: 39.7 % (ref 39.0–52.0)
Hemoglobin: 12.5 g/dL — ABNORMAL LOW (ref 13.0–17.0)
Immature Granulocytes: 1 %
Lymphocytes Relative: 8 %
Lymphs Abs: 0.5 10*3/uL — ABNORMAL LOW (ref 0.7–4.0)
MCH: 29.3 pg (ref 26.0–34.0)
MCHC: 31.5 g/dL (ref 30.0–36.0)
MCV: 93.2 fL (ref 80.0–100.0)
Monocytes Absolute: 0.9 10*3/uL (ref 0.1–1.0)
Monocytes Relative: 14 %
Neutro Abs: 4.8 10*3/uL (ref 1.7–7.7)
Neutrophils Relative %: 76 %
Platelets: 178 10*3/uL (ref 150–400)
RBC: 4.26 MIL/uL (ref 4.22–5.81)
RDW: 14.5 % (ref 11.5–15.5)
WBC: 6.2 10*3/uL (ref 4.0–10.5)
nRBC: 0 % (ref 0.0–0.2)

## 2024-03-17 LAB — OSMOLALITY: Osmolality: 304 mosm/kg — ABNORMAL HIGH (ref 275–295)

## 2024-03-17 LAB — COMPREHENSIVE METABOLIC PANEL WITH GFR
ALT: 17 U/L (ref 0–44)
AST: 16 U/L (ref 15–41)
Albumin: 2.3 g/dL — ABNORMAL LOW (ref 3.5–5.0)
Alkaline Phosphatase: 83 U/L (ref 38–126)
Anion gap: 5 (ref 5–15)
BUN: 38 mg/dL — ABNORMAL HIGH (ref 8–23)
CO2: 30 mmol/L (ref 22–32)
Calcium: 8.3 mg/dL — ABNORMAL LOW (ref 8.9–10.3)
Chloride: 103 mmol/L (ref 98–111)
Creatinine, Ser: 1.58 mg/dL — ABNORMAL HIGH (ref 0.61–1.24)
GFR, Estimated: 43 mL/min — ABNORMAL LOW (ref >60)
Glucose, Bld: 91 mg/dL (ref 70–99)
Potassium: 3.5 mmol/L (ref 3.5–5.1)
Sodium: 138 mmol/L (ref 135–145)
Total Bilirubin: 0.8 mg/dL (ref 0.0–1.2)
Total Protein: 5.9 g/dL — ABNORMAL LOW (ref 6.5–8.1)

## 2024-03-17 LAB — URIC ACID: Uric Acid, Serum: 5.5 mg/dL (ref 3.7–8.6)

## 2024-03-17 LAB — APTT: aPTT: 63 s — ABNORMAL HIGH (ref 24–36)

## 2024-03-17 LAB — T4, FREE: Free T4: 0.71 ng/dL (ref 0.61–1.12)

## 2024-03-17 LAB — PHOSPHORUS: Phosphorus: 4.8 mg/dL — ABNORMAL HIGH (ref 2.5–4.6)

## 2024-03-17 LAB — MAGNESIUM: Magnesium: 2.2 mg/dL (ref 1.7–2.4)

## 2024-03-17 LAB — HEPARIN LEVEL (UNFRACTIONATED): Heparin Unfractionated: >1.1 [IU]/mL — ABNORMAL HIGH (ref 0.30–0.70)

## 2024-03-17 LAB — LACTATE DEHYDROGENASE: LDH: 110 U/L (ref 98–192)

## 2024-03-17 LAB — CORTISOL: Cortisol, Plasma: 14.7 ug/dL

## 2024-03-17 LAB — TSH: TSH: 27.131 u[IU]/mL — ABNORMAL HIGH (ref 0.350–4.500)

## 2024-03-17 MED ORDER — MIDODRINE HCL 5 MG PO TABS
10.0000 mg | ORAL_TABLET | Freq: Three times a day (TID) | ORAL | Status: DC
  Administered 2024-03-17 – 2024-03-23 (×17): 10 mg via ORAL
  Filled 2024-03-17 (×19): qty 2

## 2024-03-17 MED ORDER — IPRATROPIUM BROMIDE 0.02 % IN SOLN: 0.5000 mg | Freq: Three times a day (TID) | RESPIRATORY_TRACT | Status: DC

## 2024-03-17 MED ORDER — METOPROLOL SUCCINATE ER 25 MG PO TB24
25.0000 mg | ORAL_TABLET | Freq: Every evening | ORAL | Status: DC
  Administered 2024-03-17 – 2024-03-20 (×4): 25 mg via ORAL
  Filled 2024-03-17 (×5): qty 1

## 2024-03-17 MED ORDER — LEVALBUTEROL HCL 0.63 MG/3ML IN NEBU: 0.6300 mg | INHALATION_SOLUTION | Freq: Three times a day (TID) | RESPIRATORY_TRACT | Status: DC

## 2024-03-17 MED ORDER — FUROSEMIDE 10 MG/ML IJ SOLN
40.0000 mg | Freq: Once | INTRAMUSCULAR | Status: DC
  Filled 2024-03-17: qty 4

## 2024-03-17 MED ORDER — PROSOURCE PLUS PO LIQD
30.0000 mL | Freq: Two times a day (BID) | ORAL | Status: DC
  Administered 2024-03-17 – 2024-03-24 (×13): 30 mL via ORAL
  Filled 2024-03-17 (×14): qty 30

## 2024-03-17 MED ORDER — ALBUMIN HUMAN 5 % IV SOLN
12.5000 g | Freq: Once | INTRAVENOUS | Status: AC
  Administered 2024-03-17: 12.5 g via INTRAVENOUS
  Filled 2024-03-17: qty 250

## 2024-03-17 MED ORDER — LEVOTHYROXINE SODIUM 50 MCG PO TABS
50.0000 ug | ORAL_TABLET | Freq: Every day | ORAL | Status: DC
  Administered 2024-03-17 – 2024-03-27 (×11): 50 ug via ORAL
  Filled 2024-03-17 (×11): qty 1

## 2024-03-17 NOTE — Progress Notes (Signed)
 PHARMACY - ANTICOAGULATION CONSULT NOTE  Pharmacy Consult for heparin Indication: atrial fibrillation  No Known Allergies  Patient Measurements: Height: 6\' 1"  (185.4 cm) Weight: 75.3 kg (166 lb) IBW/kg (Calculated) : 79.9 HEPARIN DW (KG): 75.3  Vital Signs: BP: 98/54 (06/03 0400) Pulse Rate: 86 (06/03 0400)  Labs: Recent Labs    03/16/24 1159 03/16/24 1238 03/16/24 1923 03/17/24 0338  HGB 14.1  --  13.6 12.5*  HCT 45.9  --  43.8 39.7  PLT 156  --  213 178  APTT  --   --   --  63*  HEPARINUNFRC  --   --   --  >1.10*  CREATININE  --  1.22 1.27* 1.58*    Estimated Creatinine Clearance: 38.4 mL/min (A) (by C-G formula based on SCr of 1.58 mg/dL (H)).   Medical History: Past Medical History:  Diagnosis Date   Arthritis    oa   BPH (benign prostatic hyperplasia)    Cancer (HCC) 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   Cataracts, bilateral    Glaucoma    Hypertension    Vitamin D  deficiency       Assessment: 83 yo M on apixaban pta for afib, now on hold for thoracentesis. Last dose 6/2 8am per pt. Pharmacy consulted for heparin.    AM PTT 63 seconds, heparin level falsely elevated   Goal of Therapy:  Heparin level 0.3-0.7 units/ml aPTT 66-102 seconds Monitor platelets by anticoagulation protocol: Yes   Plan:  Heparin to 1250 units / hr F/u aPTT until correlates with heparin level  Monitor daily aPTT, heparin level, CBC, signs/symptoms of bleeding  F/u restart apixaban   Thank you. Lennice Quivers, PharmD  Please check AMION for all [facility] Pharmacy phone numbers After 10:00 PM, call Main Pharmacy [phone]

## 2024-03-17 NOTE — Consult Note (Signed)
 Cardiology Consultation   Patient ID: Samuel Porter MRN: [id number]; DOB: [birth year]  Admit date: 03/16/2024 Date of Consult: 03/17/2024  PCP:  [name], MD   [facility] Providers Cardiologist:  Arnoldo Lapping, MD  Electrophysiologist:  Lei Pump, MD       Patient Profile: Samuel Porter is a 83 y.o. male with a hx of HFrEF, atrial fibrillation, hypertension, melanoma, squamous cell cancer, radical neck dissection who is being seen 03/17/2024 for the evaluation of atrial fibrillation and CHF at the request of Dr. Brock Canner.  History of Present Illness: [name] was first diagnosed with afib in February 2025. He was started on Eliquis for stroke prophylaxis. TTE at time of afib diagnosis found LVEF to be 35-40% with severe LAE. Patient was scheduled for DCCV in March but presented in sinus rhythm. A few weeks later however in afib clinic, patient again in afib with RVR and was started on oral Amiodarone . On 4/22, patient saw oncology at [facility] for follow up and per notes, there was concern regarding chest CT that showed a large right pleural effusion. He was referred to interventional pulmonology and underwent thoracentesis on 4/24 with removal of serous pleural fluid. Patient then saw Dr. Lawana Pray in EP clinic on 4/29 for further afib follow up. At this visit, patient still in afib with ongoing fatigue/weakness. DCCV planned. Patient saw Dr. Arlester Ladd 5/14 for CHF follow up and was started on Farxiga  as well as low dose Spironolactone . At this visit, it was also noted that breath sounds of right lung significantly diminished. Given recent thoracentesis for right side pleural effusion, Dr. Arlester Ladd referred patient to pulmonary medicine. He was able to see Dr. Waylan Haggard the next day where bedside ultrasound showed large free-flowing pleural effusion, right side. Dr. Waylan Haggard reviewed fluid studies from thoracentesis at [facility] and it was felt that fluid was borderline exudative  (though no malignant cells seen). Further imaging arranged with discussion of possible repeat thoracentesis. Ultimately patient underwent repeat thoracentesis on 5/20 with removal of 1.3L serosanguinous fluid. Cytology negative for malignant cells, cultures also negative. 5/21 CT chest showed large partially loculated right pleural effusion with collapse/consolidation in right middle and lower lobes (though felt improved in comparison to 4/22 imaging at [facility]). Patient then underwent his scheduled cardioversion on 5/22, successfully converted to sinus bradycardia.   Patient was seen yesterday 6/2 in pulmonary clinic, was noted with acute shortness of breath. His O2 saturation in clinic was 83% and he required a minimum of 3L O2 support to maintain adequate O2 saturation. HR noted elevated to 113 and irregular. CXR with increased right pleural effusion. Patient also with bilateral LE edema and reported weight gain. Given his dyspnea, patient advised to go to the ED. He declined EMS and was taken by private vehicle.    In the ED, labs notable for NT-pro BNP 7160, BNP 1203.9. Troponin 22->23. LDH 110. CBC and BMET were unremarkable. EKG in the ED with significant baseline wander but did show recurrent afib. Patient admitted by [facility] for management of acute hypoxic respiratory failure. Patient was transitioned from Eliquis to heparin to facilitate possible thoracentesis. His PO Amiodarone  was converted to IV. Cardiology and pulmonology consulted. Pulmonary critical care currently planning for thoracentesis on 6/4. Per their notes, regarding recurrent pleural effusion, "high suspicion related to cardiogenic fluid overload."   This morning, per notes, patient became hypotensive with downward trend of HR, and IV amiodarone  was held.   Recent history review with patient and  daughter who is also at bedside. They report worsening dyspnea over the last week, particularly over the weekend. O2 saturation low  on home monitoring. Additionally they report seeing worsening LE edema with pitting swelling to knees. Patient denies awareness of his afib, no palpitations or chest pain.   Past Medical History:  Diagnosis Date   Arthritis    oa   BPH (benign prostatic hyperplasia)    Cancer (HCC) 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   Cataracts, bilateral    Glaucoma    Hypertension    Vitamin D  deficiency     Past Surgical History:  Procedure Laterality Date   CARDIOVERSION N/A 03/05/2024   Procedure: CARDIOVERSION;  Surgeon: Hazle Lites, MD;  Location: [facility] INVASIVE CV LAB;  Service: Cardiovascular;  Laterality: N/A;   CHOLECYSTECTOMY     colonscopy  2017   IR THORACENTESIS ASP PLEURAL SPACE W/IMG GUIDE  03/03/2024   JOINT REPLACEMENT Right    hip    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   Procedure: LEFT TOTAL HIP ARTHROPLASTY ANTERIOR APPROACH;  Surgeon: Liliane Rei, MD;  Location: [facility] ORS;  Service: Orthopedics;  Laterality: Left;   VENTRAL HERNIA REPAIR N/A 07/12/2023   Procedure: HERNIA REPAIR VENTRAL;  Surgeon: Marny Sires Avon Boers, MD;  Location: [facility] OR;  Service: General;  Laterality: N/A;     Home Medications:  Prior to Admission medications   Medication Sig Start Date End Date Taking? Authorizing Provider  acetaminophen  (TYLENOL ) 325 MG tablet Take 650 mg by mouth daily as needed for moderate pain (pain score 4-6) or mild pain (pain score 1-3).   Yes [provider]  albuterol  (VENTOLIN  HFA) 108 (90 Base) MCG/ACT inhaler Inhale 2 puffs into the lungs every 4 (four) hours as needed for shortness of breath. 11/22/23  Yes [provider]  amiodarone  (PACERONE ) 200 MG tablet Take 200 mg by mouth daily.   Yes [provider]  carvedilol  (COREG ) 3.125 MG tablet Take 1 tablet (3.125 mg total) by mouth 2 (two) times daily. 03/05/24  Yes Hilty, Aviva Lemmings, MD  cholecalciferol  (VITAMIN D ) 1000 units tablet Take 1,000 Units by mouth daily.   Yes [provider]  dapagliflozin  propanediol (FARXIGA ) 10 MG TABS tablet Take 1 tablet (10 mg total) by mouth daily before breakfast. 02/26/24  Yes Arnoldo Lapping, MD  dorzolamide -timolol  (COSOPT ) 22.3-6.8 MG/ML ophthalmic solution Place 1 drop into both eyes 2 (two) times daily. 07/09/16  Yes [provider]  ELIQUIS 5 MG TABS tablet Take 5 mg by mouth 2 (two) times daily.   Yes [provider]  finasteride  (PROSCAR ) 5 MG tablet Take 5 mg by mouth daily.   Yes [provider]  furosemide  (LASIX ) 20 MG tablet Take 1 tablet (20 mg total) by mouth daily. 02/07/24 05/07/24 Yes Cooper, Michael, MD  polyethylene glycol (MIRALAX  / GLYCOLAX ) 17 g packet Take 17 g by mouth 2 (two) times daily as needed. Patient taking differently: Take 17 g by mouth daily as needed for moderate constipation or mild constipation. 07/16/23  Yes Maczis, Michael M, PA-C  sildenafil (VIAGRA) 25 MG tablet Take 25 mg by mouth as needed for erectile dysfunction.   Yes [provider]  spironolactone  (ALDACTONE ) 25 MG tablet Take 0.5 tablets (12.5 mg total) by mouth daily. 02/26/24  Yes Arnoldo Lapping, MD  valsartan  (DIOVAN ) 40 MG tablet Take 1 tablet (40 mg total) by mouth 2 (two) times daily. 12/19/23  Yes Arnoldo Lapping, MD  amLODipine  (NORVASC )  5 MG tablet TAKE 1 TABLET BY MOUTH DAILY for 90 Patient not taking: Reported on 03/17/2024    [provider]    Scheduled Meds:  carvedilol   3.125 mg Oral BID   cholecalciferol   1,000 Units Oral Daily   docusate sodium   100 mg Oral BID   dorzolamide -timolol   1 drop Both Eyes BID   Continuous Infusions:  amiodarone  30 mg/hr (03/17/24 0822)   heparin 1,250 Units/hr (03/17/24 0900)   PRN Meds: acetaminophen  **OR** acetaminophen , bisacodyl , fentaNYL  (SUBLIMAZE ) injection, hydrALAZINE, ondansetron  **OR** ondansetron  (ZOFRAN ) IV, oxyCODONE , polyethylene glycol  Allergies:   No Known Allergies  Social History:   Social History   Socioeconomic History    Marital status: Married    Spouse name: Not on file   Number of children: Not on file   Years of education: Not on file   Highest education level: Not on file  Occupational History   Not on file  Tobacco Use   Smoking status: Former    Current packs/day: 0.00    Average packs/day: 1.5 packs/day for 10.0 years (15.0 ttl pk-yrs)    Types: Cigarettes    Start date: 66    Quit date: 96    Years since quitting: 55.4   Smokeless tobacco: Never  Substance and Sexual Activity   Alcohol use: Not Currently    Comment: 1-2 glass wine per day   Drug use: No   Sexual activity: Not on file  Other Topics Concern   Not on file  Social History Narrative   Not on file   Social Drivers of Health   Financial Resource Strain: Not on file  Food Insecurity: No Food Insecurity (02/04/2024)   Received from [facility]   Hunger Vital Sign    Worried About Running Out of Food in the Last Year: Never true    Ran Out of Food in the Last Year: Never true  Transportation Needs: No Transportation Needs (02/04/2024)   Received from [facility]   PRAPARE - Transportation    Lack of Transportation (Medical): No    Lack of Transportation (Non-Medical): No  Physical Activity: Not on file  Stress: Not on file  Social Connections: Not on file  Intimate Partner Violence: Not At Risk (11/30/2022)   Humiliation, Afraid, Rape, and Kick questionnaire    Fear of Current or Ex-Partner: No    Emotionally Abused: No    Physically Abused: No    Sexually Abused: No    Family History:    Family History  Problem Relation Age of Onset   Cancer - Ovarian Sister    Lymphoma Sister      ROS:  Please see the history of present illness.   All other ROS reviewed and negative.     Physical Exam/Data: Vitals:   03/16/24 2200 03/17/24 0000 03/17/24 0100 03/17/24 0400  BP: 98/74 (!) 76/56 104/73 (!) 98/54  Pulse: 88 89 88 86  Resp: 18 14 19 13   Temp:      TempSrc:      SpO2: 95% 97% 94% 99%  Weight:       Height:        Intake/Output Summary (Last 24 hours) at 03/17/2024 0920 Last data filed at 03/17/2024 5409 Gross per 24 hour  Intake 603.13 ml  Output 2350 ml  Net -1746.87 ml      03/16/2024   11:46 AM 03/16/2024   10:05 AM 03/16/2024   10:03 AM  Last 3 Weights  Weight (  lbs) 166 lb 166 lb 9.6 oz 166 lb 9.6 oz  Weight (kg) 75.297 kg 75.569 kg 75.569 kg     Body mass index is 21.9 kg/m.  General:  Well nourished, well developed, in no acute distress. Frail appearing HEENT: normal Neck: JVP elevated midway to mandible Vascular: No carotid bruits; Distal pulses 2+ bilaterally Cardiac:  normal S1, S2; irregularly irregular; no murmur  Lungs:  clear to auscultation bilaterally, no wheezing, rhonchi or rales  Abd: soft, nontender, no hepatomegaly  Ext: SCDs in place. 2+ pitting edema bilaterally to knee Musculoskeletal:  No deformities, BUE and BLE strength normal and equal Skin: warm and dry  Neuro:  CNs 2-12 intact, no focal abnormalities noted Psych:  Normal affect   EKG:  The EKG was personally reviewed and demonstrates:  EKG has significant baseline wander but shows recurrent afib with ventricular rate >100bpm.  Telemetry:  Telemetry was personally reviewed and demonstrates:  atrial fibrillation with rates 80s-90s this morning.  Relevant CV Studies:  12/19/23 TTE  IMPRESSIONS     1. Left ventricular ejection fraction, by estimation, is 35 to 40%. The  left ventricle has moderately decreased function. The left ventricle has  no regional wall motion abnormalities. Left ventricular diastolic function  could not be evaluated.   2. Right ventricular systolic function is moderately reduced. The right  ventricular size is normal. There is mildly elevated pulmonary artery  systolic pressure.   3. Left atrial size was severely dilated.   4. Right atrial size was moderately dilated.   5. A small pericardial effusion is present. The pericardial effusion is  localized near the  right atrium. There is no evidence of cardiac  tamponade.   6. The mitral valve is degenerative. Trivial mitral valve regurgitation.  No evidence of mitral stenosis.   7. The aortic valve is tricuspid. There is mild calcification of the  aortic valve. Aortic valve regurgitation is not visualized. No aortic  stenosis is present.   8. Aortic dilatation noted. There is borderline dilatation of the aortic  root, measuring 38 mm.   9. The inferior vena cava is normal in size with greater than 50%  respiratory variability, suggesting right atrial pressure of 3 mmHg.   Conclusion(s)/Recommendation(s): Patient in atrial fibrillation with RVR  and moderately reduced biventricular heart function. Consider  tachy-induced cardiomyopathy.   FINDINGS   Left Ventricle: Left ventricular ejection fraction, by estimation, is 35  to 40%. The left ventricle has moderately decreased function. The left  ventricle has no regional wall motion abnormalities. The left ventricular  internal cavity size was normal in  size. There is no left ventricular hypertrophy. Left ventricular diastolic  function could not be evaluated due to atrial fibrillation. Left  ventricular diastolic function could not be evaluated.   Right Ventricle: The right ventricular size is normal. No increase in  right ventricular wall thickness. Right ventricular systolic function is  moderately reduced. There is mildly elevated pulmonary artery systolic  pressure. The tricuspid regurgitant  velocity is 2.97 m/s, and with an assumed right atrial pressure of 3 mmHg,  the estimated right ventricular systolic pressure is 38.3 mmHg.   Left Atrium: Left atrial size was severely dilated.   Right Atrium: Right atrial size was moderately dilated.   Pericardium: A small pericardial effusion is present. The pericardial  effusion is localized near the right atrium. There is no evidence of  cardiac tamponade.   Mitral Valve: The mitral valve is  degenerative in appearance. Trivial  mitral valve regurgitation. No evidence of mitral valve stenosis.   Tricuspid Valve: The tricuspid valve is normal in structure. Tricuspid  valve regurgitation is mild . No evidence of tricuspid stenosis.   Aortic Valve: The aortic valve is tricuspid. There is mild calcification  of the aortic valve. Aortic valve regurgitation is not visualized. No  aortic stenosis is present.   Pulmonic Valve: The pulmonic valve was normal in structure. Pulmonic valve  regurgitation is trivial. No evidence of pulmonic stenosis.   Aorta: Aortic dilatation noted. There is borderline dilatation of the  aortic root, measuring 38 mm.   Venous: The inferior vena cava is normal in size with greater than 50%  respiratory variability, suggesting right atrial pressure of 3 mmHg.   IAS/Shunts: No atrial level shunt detected by color flow Doppler.   Laboratory Data: High Sensitivity Troponin:  No results for input(s): "TROPONINIHS" in the last 720 hours.   Chemistry Recent Labs  Lab 03/16/24 1238 03/16/24 1923 03/17/24 0338  NA 140 139 138  K 3.8 3.7 3.5  CL 102 102 103  CO2 27 27 30   GLUCOSE 109* 123* 91  BUN 35* 33* 38*  CREATININE 1.22 1.27* 1.58*  CALCIUM 9.5 8.6* 8.3*  MG  --  2.3 2.2  GFRNONAA 59* 56* 43*  ANIONGAP 11 10 5     Recent Labs  Lab 03/16/24 1923 03/17/24 0338  PROT 6.6 5.9*  ALBUMIN 2.6* 2.3*  AST 21 16  ALT 19 17  ALKPHOS 100 83  BILITOT 1.1 0.8   Lipids No results for input(s): "CHOL", "TRIG", "HDL", "LABVLDL", "LDLCALC", "CHOLHDL" in the last 168 hours.  Hematology Recent Labs  Lab 03/16/24 1159 03/16/24 1923 03/17/24 0338  WBC 8.3 7.8 6.2  RBC 4.87 4.66 4.26  HGB 14.1 13.6 12.5*  HCT 45.9 43.8 39.7  MCV 94.3 94.0 93.2  MCH 29.0 29.2 29.3  MCHC 30.7 31.1 31.5  RDW 14.7 14.4 14.5  PLT 156 213 178   Thyroid   Recent Labs  Lab 03/17/24 0338  TSH 27.131*    BNP Recent Labs  Lab 03/16/24 1238 03/16/24 1923  BNP  --   1,203.9*  PROBNP 7,160.0*  --     DDimer No results for input(s): "DDIMER" in the last 168 hours.  Radiology/Studies:  DG Chest 2 View Result Date: 03/17/2024 CLINICAL DATA:  Dyspnea EXAM: CHEST - 2 VIEW COMPARISON:  March 16, 2024 FINDINGS: Persistent large right pleural effusion with right lower lobe atelectasis Persistent bilateral pulmonary infiltrates that could correlate with congestive changes Heart normal size IMPRESSION: No change compared with prior Electronically Signed   By: Fredrich Jefferson M.D.   On: 03/17/2024 08:39   DG Chest 1 View Result Date: 03/16/2024 CLINICAL DATA:  History of right pleural effusion EXAM: CHEST  1 VIEW COMPARISON:  Chest radiograph dated 03/03/2024 FINDINGS: Dense right lower lung opacity. Patchy left lower lung opacity. Increased large right pleural effusion. No pneumothorax. Right heart border is obscured. No acute osseous abnormality. Right axillary surgical clips. IMPRESSION: 1. Increased large right pleural effusion with dense right lower lung opacity, likely atelectasis. 2. Patchy left lower lung opacity, likely atelectasis. Electronically Signed   By: Limin  Xu M.D.   On: 03/16/2024 11:30     Assessment and Plan:  Acute on chronic HFrEF Acute hypoxic respiratory failure Recurrent right pleural effusion  Patient admitted from pulmonology office yesterday with hypoxia, weight gain, dyspnea, LE edema. NT-pro BNP 7160, BNP 1203.9. Troponin 22->23. Chest imaging showed recurrent right side  pleural effusion. Since admission, patient has received IV lasix  40mg  x2 doses. Today metabolic panel with small AKI, creatinine up to 1.58. Net I/O -1.5. Challenging situation with multiple etiologies for dyspnea. Though BNP is significantly elevated, this likely reflects severe atrial dilation with atrial fibrillation in addition to CHF. A recurrent right side only pleural effusion would be very atypical for CHF cause. Chest tube planned for tomorrow. Further analysis of  pleural fluid will be important in assessing etiology.  In setting of AKI, hold GDMT including Jardiance, Spironolactone , ARB. Patient still has visible LE edema and elevation of JVP. Nephrology has been consulted due to AKI, will defer diuresis to their team. I also suspect a degree of third spacing with hypoalbuminemia.  With AM hypotension, will switch from Coreg  to Toprol  XL, 25mg . Patient's daughter expressed concern today for Amyloidosis. Discussed that his February TTE did not reflect myocardial changes that would be consistent with this. 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   Persistent atrial fibrillation Patient diagnosed with afib earlier this year underwent DCCV on 5/22 and has been maintained on Amiodarone  200mg  daily. Noted with recurrent afib with RVR yesterday in pulmonology office as well as in the ED. Admitting provider stopped PO Amiodarone  and placed patient on infusion. This morning, patient developed hypotension and primary team held Amiodarone  (has since resumed).  Suspect recurrent afib is due to cardiac irritation from significant right side pleural effusion. Would continue Amiodarone  (PO is okay). In the short-term, recommend rate control strategy. Given cardioversion on 03/05/24, patient MUST BE MAINTAINED on anticoagulation.  Convert heparin->Eliquis as soon as able.     Risk Assessment/Risk Scores:              For questions or updates, please contact [facility] Please consult [email or web address] for contact info under    Signed, Leala Prince, PA-C  03/17/2024 9:20 AM

## 2024-03-17 NOTE — Progress Notes (Addendum)
 PROGRESS NOTE                                                                                                                                                                                                             Patient Demographics:    Samuel Porter, is a 83 y.o. male, DOB - [birth year], ZOX:[id number]  Outpatient Primary MD for the patient is [name], MD    LOS - 1  Admit date - 03/16/2024    Chief Complaint  Patient presents with   Shortness of Breath       Brief Narrative (HPI from H&P)   83 y.o. male with medical history significant for but not limited to BPH, glaucoma, hypertension, vitamin D  deficiency, atrial fibrillation on anticoagulation with recent cardioversion about 2 weeks ago, history of pleural effusions and other comorbidities who presents with worsening shortness of breath last 3 days.  Patient states that he started developing lower extremity edema that progressively got worse as well as worsening shortness of breath that started 3 days ago.  He states that he was at home and is monitoring his O2 saturations and his O2 saturation for 3 days were about 95% on room air and subsequently continued to drop over the 3 days.  Patient went to go see his pulmonologist today and is found to be hypoxic on room air so they directly admitted him to hospital for further evaluation.   In the ER workup consistent with A-fib RVR, acute on chronic diastolic CHF, recurrent pleural effusion right more than left, he also had hypoxic respiratory failure and was admitted to the hospital.     Subjective:    Samuel Porter today has, No headache, No chest pain, No abdominal pain - No Nausea, No new weakness tingling or numbness, does have mild shortness of breath   Assessment  & Plan :    Acute respiratory failure with hypoxia in the setting of A-fib with RVR, acute on chronic systolic CHF EF around 40%, recurrent bilateral  pleural effusions right more than left.   Patient currently on 3 to 4 L nasal cannula oxygen, pulmonary on board contemplating thoracentesis in a day or 2, Eliquis held for washout, supplemental oxygen, up in chair with I-S and flutter valve for pulmonary toiletry, for management of pulmonary issues to PCCM team.    Paroxysmal  A-fib and RVR, acute on chronic systolic heart failure EF 35%.  Currently on amiodarone  drip for rate control, Eliquis held for washout by pulmonary, cardiology consulted, will defer cardiac issues to the cardiology team.  Monitor on telemetry.  Low blood pressure and renal function do not allow addition of beta-blocker, ACE/ARB or Entresto.  Recurrent effusion right more than left.  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   Elevated troponin: Mildly flattened in setting of A-fib with RVR and acute on chronic CHF.  And is flat, cardiology on board.  Chest pain-free.   AKI.  Nephrology consulted.  Defer management to them including fluid management.  Glaucoma: Continue home eyedrops   BPH: Currently holding his home finasteride    Essential hypertension: Continue blood pressure low, hold beta-blocker, midodrine, albumin, Prosource, as needed IV hydralazine, monitor.  TSH is extremely high started on Synthroid, check morning cortisol as well.   Hypoalbuminemia: Patient's Albumin Level is now 2.6. CTM and Trend and repeat CMP in the AM, placed on Prosource.   Hypothyroidism.  TSH greater than 25.  Check free T4 initiate Synthroid.       Condition -  Extremely Guarded  Family Communication  : Daughter bedside 03/17/2024  Code Status : Full code  Consults  : Pulmonary, cardiology, nephrology  PUD Prophylaxis :     Procedures  :           Disposition Plan  :    Status is: Inpatient   DVT Prophylaxis  :    Place TED hose Start: 03/17/24 1010 SCDs Start: 03/16/24 1742   Lab Results  Component Value Date   PLT 178 03/17/2024    Diet :  Diet Order             Diet Heart Room service appropriate? Yes; Fluid consistency: Thin; Fluid restriction: 1500 mL Fluid  Diet effective now                    Inpatient Medications  Scheduled Meds:  carvedilol   3.125 mg Oral BID   cholecalciferol   1,000 Units Oral Daily   docusate sodium   100 mg Oral BID   dorzolamide -timolol   1 drop Both Eyes BID   furosemide   40 mg Intravenous Once   midodrine  10 mg Oral TID WC   Continuous Infusions:  albumin human     amiodarone  30 mg/hr (03/17/24 0822)   heparin 1,250 Units/hr (03/17/24 0900)   PRN Meds:.acetaminophen  **OR** acetaminophen , bisacodyl , fentaNYL  (SUBLIMAZE ) injection, hydrALAZINE, ondansetron  **OR** ondansetron  (ZOFRAN ) IV, oxyCODONE , polyethylene glycol  Antibiotics  :    Anti-infectives (From admission, onward)    None         Objective:   Vitals:   03/17/24 0000 03/17/24 0100 03/17/24 0400 03/17/24 0944  BP: (!) 76/56 104/73 (!) 98/54 91/72  Pulse: 89 88 86   Resp: 14 19 13    Temp:    97.6 F (36.4 C)  TempSrc:    Oral  SpO2: 97% 94% 99%   Weight:      Height:        Wt  Readings from Last 3 Encounters:  03/16/24 75.3 kg  03/16/24 75.6 kg  03/05/24 73 kg     Intake/Output Summary (Last 24 hours) at 03/17/2024 1052 Last data filed at 03/17/2024 0900 Gross per 24 hour  Intake 843.13 ml  Output 2350 ml  Net -1506.87 ml     Physical Exam  Awake Alert, No new F.N deficits, Normal affect [place].AT,PERRAL Supple Neck, No JVD,   Symmetrical Chest wall movement, Good air movement  bilaterally, CTAB RRR,No Gallops,Rubs or new Murmurs,  +ve B.Sounds, Abd Soft, No tenderness,   No Cyanosis, Clubbing or edema     Data Review:    Recent Labs  Lab 03/16/24 1159 03/16/24 1923 03/17/24 0338  WBC 8.3 7.8 6.2  HGB 14.1 13.6 12.5*  HCT 45.9 43.8 39.7  PLT 156 213 178  MCV 94.3 94.0 93.2  MCH 29.0 29.2 29.3  MCHC 30.7 31.1 31.5  RDW 14.7 14.4 14.5  LYMPHSABS  --  0.6* 0.5*  MONOABS  --  1.0 0.9  EOSABS  --  0.0 0.0  BASOSABS  --  0.0 0.0    Recent Labs  Lab 03/12/24 0917 03/16/24 1238 03/16/24 1923 03/17/24 0338  NA 141 140 139 138  K 4.2 3.8 3.7 3.5  CL 102 102 102 103  CO2 26 27 27 30   ANIONGAP  --  11 10 5   GLUCOSE 114* 109* 123* 91  BUN 18 35* 33* 38*  CREATININE 1.06 1.22 1.27* 1.58*  AST  --   --  21 16  ALT  --   --  19 17  ALKPHOS  --   --  100 83  BILITOT  --   --  1.1 0.8  ALBUMIN  --   --  2.6* 2.3*  TSH  --   --   --  27.131*  BNP  --   --  1,203.9*  --   MG  --   --  2.3 2.2  PHOS  --   --  4.4 4.8*  CALCIUM 8.7 9.5 8.6* 8.3*      Recent Labs  Lab 03/12/24 0917 03/16/24 1238 03/16/24 1923 03/17/24 0338  TSH  --   --   --  27.131*  BNP  --   --  1,203.9*  --   MG  --   --  2.3 2.2  CALCIUM 8.7 9.5 8.6* 8.3*    --------------------------------------------------------------------------------------------------------------- No results found for: "CHOL", "HDL", "LDLCALC", "LDLDIRECT", "TRIG", "CHOLHDL"  No results found for: "HGBA1C" Recent Labs    03/17/24 0338  TSH 27.131*   No results for input(s): "VITAMINB12", "FOLATE", "FERRITIN", "TIBC", "IRON", "RETICCTPCT" in the last 72 hours. ------------------------------------------------------------------------------------------------------------------ Cardiac Enzymes No results for input(s): "CKMB", "TROPONINI", "MYOGLOBIN" in the last 168 hours.  Invalid input(s): "CK"  Micro Results No results found for this or any previous visit (from the past 240  hours).  Radiology Report DG Chest 2 View Result Date: 03/17/2024 CLINICAL DATA:  Dyspnea EXAM: CHEST - 2 VIEW COMPARISON:  March 16, 2024 FINDINGS: Persistent large right pleural effusion with right lower lobe atelectasis Persistent bilateral pulmonary infiltrates that could correlate with congestive changes Heart normal size IMPRESSION: No change compared with prior Electronically Signed   By: Fredrich Jefferson M.D.   On: 03/17/2024 08:39   DG Chest 1 View Result Date: 03/16/2024 CLINICAL DATA:  History of right pleural effusion EXAM: CHEST  1 VIEW COMPARISON:  Chest radiograph dated 03/03/2024 FINDINGS: Dense right lower lung opacity. Patchy left lower  lung opacity. Increased large right pleural effusion. No pneumothorax. Right heart border is obscured. No acute osseous abnormality. Right axillary surgical clips. IMPRESSION: 1. Increased large right pleural effusion with dense right lower lung opacity, likely atelectasis. 2. Patchy left lower lung opacity, likely atelectasis. Electronically Signed   By: Limin  Xu M.D.   On: 03/16/2024 11:30     Signature  -   Lynnwood Sauer M.D on 03/17/2024 at 10:52 AM   -  To page go to [email or web address]

## 2024-03-17 NOTE — Progress Notes (Signed)
 One time IV lasix  dose held due to low blood pressure. MD made aware

## 2024-03-17 NOTE — Progress Notes (Signed)
 [facility] night cross cover note:   Pt was becoming hypotensive while on amiodarone  drip, with SBP's into the 80's mmHg. No new symptoms. Remains in a fib, but with HR's improved into the 80's bpm. Amiodarone  drip held, but not d\c'ed, while further trending BP to guide potential resumption.     Camelia Cavalier, DO Hospitalist

## 2024-03-17 NOTE — Evaluation (Signed)
 Physical Therapy Evaluation [name] MRN: [id number] DOB: December 15, 1940 Today's Date: 03/17/2024  History of Present Illness  83 year old man presented 6/2 from pulmonary clinic with worsening dyspnea exertion, hypoxemia, lower extremity swelling, and A-fib with RVR concerning for CHF exacerbation with recurrent right greater than left pleural effusion, and AKI.  PMHx includes BPH, glaucoma, hypertension, vitamin D  deficiency, atrial fibrillation on anticoagulation with recent cardioversion ~2 wks ago.   Clinical Impression  Pt admitted with above diagnosis. PTA patient independent with gait and ADLs, driving, no recent falls reported. Lives alone but has 24/7 assist available if necessary. Able to transfer and ambulate at a supervision level today. Tolerated majority of distance on room air but gradually SpO2 decreased to a nadir of 85% with mild dyspnea. Resolved quickly after sitting with application of 1.5L supplemental O2 back into mid 90s. Pt SpO2 94% on RA at rest. HR up to 110 with gait. Would benefit from HHPT follow-up at d/c to help restore strength, balance, and gross conditioning to maximize functional abilities. Pt currently with functional limitations due to the deficits listed below (see PT Problem List). Pt will benefit from acute skilled PT to increase their independence and safety with mobility to allow discharge.           If plan is discharge home, recommend the following: A little help with walking and/or transfers;A little help with bathing/dressing/bathroom;Assistance with cooking/housework;Assist for transportation   Can travel by private vehicle        Equipment Recommendations None recommended by PT  Recommendations for Other Services       Functional Status Assessment Patient has had a recent decline in their functional status and demonstrates the ability to make significant improvements in function in a reasonable and predictable amount of  time.     Precautions / Restrictions Precautions Precautions: Fall Recall of Precautions/Restrictions: Intact Restrictions Weight Bearing Restrictions Per Provider Order: No      Mobility  Bed Mobility Overal bed mobility: Modified Independent             General bed mobility comments: extra time no assist.    Transfers Overall transfer level: Needs assistance Equipment used: Rolling walker (2 wheels) Transfers: Sit to/from Stand Sit to Stand: Supervision           General transfer comment: Supervision for safety, slow to rise but stable with RW for support upon standing.    Ambulation/Gait Ambulation/Gait assistance: Supervision Gait Distance (Feet): 105 Feet Assistive device: Rolling walker (2 wheels) Gait Pattern/deviations: Step-through pattern, Decreased stride length Gait velocity: dec Gait velocity interpretation: <1.8 ft/sec, indicate of risk for recurrent falls   General Gait Details: Slower gait, a bit guarded, mild dyspnea. SpO2 slowly decreased over distance with Nadir of 85% on RA. Returned to 90s rapidly with 1.5L and seated break. Educated pt on RW use and demonstrates good control. No signs of overt LOB. Does feel more confident with this device right now and is agreeable to use for the time being.  Stairs            Wheelchair Mobility     Tilt Bed    Modified Rankin (Stroke Patients Only)       Balance Overall balance assessment: Mild deficits observed, not formally tested  Pertinent Vitals/Pain Pain Assessment Pain Assessment: No/denies pain    Home Living Family/patient expects to be discharged to:: Private residence Living Arrangements: Alone Available Help at Discharge: Family;Available 24 hours/day Type of Home: House Home Access: Level entry       Home Layout: Two level;Full bath on main level;Able to live on main level with bedroom/bathroom Home  Equipment: Grab bars - toilet;Grab bars - tub/shower;Shower seat - built in;Shower seat;BSC/3in1;Cane - single point;Rollator (4 wheels);Transport chair      Prior Function Prior Level of Function : Independent/Modified Independent;Driving             Mobility Comments: ind no device ADLs Comments: ind     Extremity/Trunk Assessment   Upper Extremity Assessment Upper Extremity Assessment: Defer to OT evaluation    Lower Extremity Assessment Lower Extremity Assessment: Generalized weakness       Communication   Communication Communication: No apparent difficulties    Cognition Arousal: Alert Behavior During Therapy: WFL for tasks assessed/performed   PT - Cognitive impairments: No apparent impairments                         Following commands: Intact       Cueing Cueing Techniques: Verbal cues     General Comments      Exercises     Assessment/Plan    PT Assessment Patient needs continued PT services  PT Problem List Decreased strength;Decreased activity tolerance;Decreased balance;Decreased mobility;Decreased knowledge of use of DME;Cardiopulmonary status limiting activity       PT Treatment Interventions DME instruction;Gait training;Functional mobility training;Therapeutic activities;Therapeutic exercise;Balance training;Neuromuscular re-education;Patient/family education    PT Goals (Current goals can be found in the Care Plan section)  Acute Rehab PT Goals Patient Stated Goal: Go home PT Goal Formulation: With patient Time For Goal Achievement: 03/31/24 Potential to Achieve Goals: Good    Frequency Min 2X/week     Co-evaluation               AM-PAC PT "6 Clicks" Mobility  Outcome Measure Help needed turning from your back to your side while in a flat bed without using bedrails?: None Help needed moving from lying on your back to sitting on the side of a flat bed without using bedrails?: None Help needed moving to and from  a bed to a chair (including a wheelchair)?: A Little Help needed standing up from a chair using your arms (e.g., wheelchair or bedside chair)?: A Little Help needed to walk in hospital room?: A Little Help needed climbing 3-5 steps with a railing? : A Little 6 Click Score: 20    End of Session Equipment Utilized During Treatment: Gait belt Activity Tolerance: Patient tolerated treatment well Patient left: in bed;with call bell/phone within reach;with bed alarm set;with family/visitor present Nurse Communication: Mobility status (sats) PT Visit Diagnosis: Other abnormalities of gait and mobility (R26.89);Muscle weakness (generalized) (M62.81)    Time: 1610-9604 PT Time Calculation (min) (ACUTE ONLY): 24 min   Charges:   PT Evaluation $PT Eval Low Complexity: 1 Low PT Treatments $Gait Training: 8-22 mins PT General Charges $$ ACUTE PT VISIT: 1 Visit         Jory Ng, PT, DPT [facility]  Rehabilitation Services Physical Therapist Office: [phone] Website: [email or web address]   Alinda Irani 03/17/2024, 10:54 AM

## 2024-03-17 NOTE — Consult Note (Signed)
 [facility] Renal Consultation Note  Requesting MD: Dr. Zelda Hickman  Indication for Consultation: Diuresis w/ AKI   HPI:  Samuel Porter is a 83 y.o. male.  Admitted 6/2 for SOB with new oxygen requirement 2/2 CHF exacerbation. PMHx includes BPH, glaucoma, hypertension, vitamin D  deficiency, atrial fibrillation on anticoagulation with recent cardioversion ~2 wks ago.  Last TTE 12/2023 showing LVEF 35 to 40% and severe left atrial dilation and degenerative mitral valve.   Creatinine baseline appears to be ~1.  Creatinine elevated in the setting of receiving 40 mg IV Lasix  x2 yesterday with 1.7 L output.  Per record review dry weight appears to be around 160 lb.  He is currently 166 lb.  Blood pressures have remained soft with most MAPs greater than 65.  Patient reports his breathing and lower extremity edema is improved. He was not on oxygen this morning. Denies prior history of CKD.    Creatinine  Date/Time Value Ref Range Status  11/01/2022 09:54 AM 0.91 0.61 - 1.24 mg/dL Final   Creatinine, Ser  Date/Time Value Ref Range Status  03/17/2024 03:38 AM 1.58 (H) 0.61 - 1.24 mg/dL Final  65/78/4696 29:52 PM 1.27 (H) 0.61 - 1.24 mg/dL Final  84/13/2440 10:27 PM 1.22 0.61 - 1.24 mg/dL Final  25/36/6440 34:74 AM 1.06 0.76 - 1.27 mg/dL Final  25/95/6387 56:43 AM 1.07 0.76 - 1.27 mg/dL Final  32/95/1884 16:60 AM 0.73 (L) 0.76 - 1.27 mg/dL Final  63/10/6008 93:23 AM 0.83 0.61 - 1.24 mg/dL Final  55/73/2202 54:27 AM 1.29 (H) 0.61 - 1.24 mg/dL Final  04/07/7627 31:51 PM 1.68 (H) 0.61 - 1.24 mg/dL Final  76/16/0737 10:62 AM 0.88 0.61 - 1.24 mg/dL Final  69/48/5462 70:35 AM 0.82 0.61 - 1.24 mg/dL Final  00/93/8182 99:37 AM 0.88 0.61 - 1.24 mg/dL Final     PMHx:   Past Medical History:  Diagnosis Date   Arthritis    oa   BPH (benign prostatic hyperplasia)    Cancer (HCC) 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   Cataracts, bilateral    Glaucoma    Hypertension    Vitamin D   deficiency     Past Surgical History:  Procedure Laterality Date   CARDIOVERSION N/A 03/05/2024   Procedure: CARDIOVERSION;  Surgeon: Hazle Lites, MD;  Location: [facility];  Service: Cardiovascular;  Laterality: N/A;   CHOLECYSTECTOMY     colonscopy  2017   IR THORACENTESIS ASP PLEURAL SPACE W/IMG GUIDE  03/03/2024   JOINT REPLACEMENT Right    hip    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   Procedure: LEFT TOTAL HIP ARTHROPLASTY ANTERIOR APPROACH;  Surgeon: Liliane Rei, MD;  Location: [facility];  Service: Orthopedics;  Laterality: Left;   VENTRAL HERNIA REPAIR N/A 07/12/2023   Procedure: HERNIA REPAIR VENTRAL;  Surgeon: Stechschulte, Avon Boers, MD;  Location: [facility] OR;  Service: General;  Laterality: N/A;    Family Hx:  Family History  Problem Relation Age of Onset   Cancer - Ovarian Sister    Lymphoma Sister     Social History:  reports that he quit smoking about 55 years ago. His smoking use included cigarettes. He started smoking about 65 years ago. He has a 15 pack-year smoking history. He has never used smokeless tobacco. He reports that he does not currently use alcohol. He reports that he does not use drugs.  Allergies: No Known Allergies  Medications: Prior to Admission medications   Medication Sig Start Date  End Date Taking? Authorizing Provider  acetaminophen  (TYLENOL ) 325 MG tablet Take 650 mg by mouth daily as needed for moderate pain (pain score 4-6) or mild pain (pain score 1-3).   Yes [provider]  albuterol  (VENTOLIN  HFA) 108 (90 Base) MCG/ACT inhaler Inhale 2 puffs into the lungs every 4 (four) hours as needed for shortness of breath. 11/22/23  Yes [provider]  amiodarone  (PACERONE ) 200 MG tablet Take 200 mg by mouth daily.   Yes [provider]  carvedilol  (COREG ) 3.125 MG tablet Take 1 tablet (3.125 mg total) by mouth 2 (two) times daily. 03/05/24  Yes Hilty, Aviva Lemmings, MD  cholecalciferol  (VITAMIN D ) 1000 units tablet Take  1,000 Units by mouth daily.   Yes [provider]  dapagliflozin  propanediol (FARXIGA ) 10 MG TABS tablet Take 1 tablet (10 mg total) by mouth daily before breakfast. 02/26/24  Yes Arnoldo Lapping, MD  dorzolamide -timolol  (COSOPT ) 22.3-6.8 MG/ML ophthalmic solution Place 1 drop into both eyes 2 (two) times daily. 07/09/16  Yes [provider]  ELIQUIS 5 MG TABS tablet Take 5 mg by mouth 2 (two) times daily.   Yes [provider]  finasteride  (PROSCAR ) 5 MG tablet Take 5 mg by mouth daily.   Yes [provider]  furosemide  (LASIX ) 20 MG tablet Take 1 tablet (20 mg total) by mouth daily. 02/07/24 05/07/24 Yes Cooper, Michael, MD  polyethylene glycol (MIRALAX  / GLYCOLAX ) 17 g packet Take 17 g by mouth 2 (two) times daily as needed. Patient taking differently: Take 17 g by mouth daily as needed for moderate constipation or mild constipation. 07/16/23  Yes Maczis, Michael M, PA-C  sildenafil (VIAGRA) 25 MG tablet Take 25 mg by mouth as needed for erectile dysfunction.   Yes [provider]  spironolactone  (ALDACTONE ) 25 MG tablet Take 0.5 tablets (12.5 mg total) by mouth daily. 02/26/24  Yes Arnoldo Lapping, MD  valsartan  (DIOVAN ) 40 MG tablet Take 1 tablet (40 mg total) by mouth 2 (two) times daily. 12/19/23  Yes Arnoldo Lapping, MD  amLODipine  (NORVASC ) 5 MG tablet TAKE 1 TABLET BY MOUTH DAILY for 90 Patient not taking: Reported on 03/17/2024    [provider]    I have reviewed the patient's current medications.  Labs:  Results for orders placed or performed during the hospital encounter of 03/16/24 (from the past 48 hours)  CBC     Status: None   Collection Time: 03/16/24 11:59 AM  Result Value Ref Range   WBC 8.3 4.0 - 10.5 K/uL   RBC 4.87 4.22 - 5.81 MIL/uL   Hemoglobin 14.1 13.0 - 17.0 g/dL   HCT 88.4 16.6 - 06.3 %   MCV 94.3 80.0 - 100.0 fL   MCH 29.0 26.0 - 34.0 pg   MCHC 30.7 30.0 - 36.0 g/dL   RDW 01.6 01.0 - 93.2 %   Platelets 156 150 -  400 K/uL   nRBC 0.0 0.0 - 0.2 %    Comment: Performed at [facility], [address], [place], [place] [address]  Basic metabolic panel with GFR     Status: Abnormal   Collection Time: 03/16/24 12:38 PM  Result Value Ref Range   Sodium 140 135 - 145 mmol/L   Potassium 3.8 3.5 - 5.1 mmol/L   Chloride 102 98 - 111 mmol/L   CO2 27 22 - 32 mmol/L   Glucose, Bld 109 (H) 70 - 99 mg/dL    Comment: Glucose reference range applies only to samples  taken after fasting for at least 8 hours.   BUN 35 (H) 8 - 23 mg/dL   Creatinine, Ser 1.47 0.61 - 1.24 mg/dL   Calcium 9.5 8.9 - 82.9 mg/dL   GFR, Estimated 59 (L) >60 mL/min    Comment: (NOTE) Calculated using the CKD-EPI Creatinine Equation (2021)    Anion gap 11 5 - 15    Comment: Performed at [facility], [address] [place], [place] [address]  Pro Brain natriuretic peptide     Status: Abnormal   Collection Time: 03/16/24 12:38 PM  Result Value Ref Range   Pro Brain Natriuretic Peptide 7,160.0 (H) <300.0 pg/mL    Comment: (NOTE) Age Group        Cut-Points    Interpretation  < 50 years     450 pg/mL       NT-proBNP > 450 pg/mL indicates                                ADHF is likely              50 to 75 years  900 pg/mL      NT-proBNP > 900 pg/mL indicates          ADHF is likely  > 75 years      1800 pg/mL     NT-proBNP > 1800 pg/mL indicates          ADHF is likely                           All ages    Results between       Indeterminate. Further clinical             300 and the cut-   information is needed to determine            point for age group   if ADHF is present.                                                             Elecsys proBNP II/ Elecsys proBNP II STAT           Cut-Point                       Interpretation  300 pg/mL                    NT-proBNP <300pg/mL indicates                             ADHF is not likely  Performed at [facility], [address], [place], [place] [address]   Troponin T, High Sensitivity     Status: Abnormal   Collection Time: 03/16/24 12:38 PM  Result Value Ref Range   Troponin T High Sensitivity 22 (H) <19 ng/L    Comment: (NOTE) Biotin concentrations > 1000 ng/mL falsely decrease TnT results.  Serial cardiac troponin measurements are suggested.  Refer to the Links section for chest pain algorithms and additional  guidance. Performed at [facility], [address] [place], [place] [address]  Troponin T, High Sensitivity     Status: Abnormal   Collection Time: 03/16/24  2:08 PM  Result Value Ref Range   Troponin T High Sensitivity 23 (H) <19 ng/L    Comment: (NOTE) Biotin concentrations > 1000 ng/mL falsely decrease TnT results.  Serial cardiac troponin measurements are suggested.  Refer to the Links section for chest pain algorithms and additional  guidance. Performed at [facility], [address], [place], [place] [address]   Brain natriuretic peptide     Status: Abnormal   Collection Time: 03/16/24  7:23 PM  Result Value Ref Range   B Natriuretic Peptide 1,203.9 (H) 0.0 - 100.0 pg/mL    Comment: Performed at [facility], [address], [place], [place] [address]  CBC with Differential/Platelet     Status: Abnormal   Collection Time: 03/16/24  7:23 PM  Result Value Ref Range   WBC 7.8 4.0 - 10.5 K/uL   RBC 4.66 4.22 - 5.81 MIL/uL   Hemoglobin 13.6 13.0 - 17.0 g/dL   HCT 41.3 24.4 - 01.0 %   MCV 94.0 80.0 - 100.0 fL   MCH 29.2 26.0 - 34.0 pg   MCHC 31.1 30.0 - 36.0 g/dL   RDW 27.2 53.6 - 64.4 %   Platelets 213 150 - 400 K/uL   nRBC 0.0 0.0 - 0.2 %   Neutrophils Relative % 79 %   Neutro Abs 6.1 1.7 - 7.7 K/uL   Lymphocytes Relative 7 %   Lymphs Abs 0.6 (L) 0.7 - 4.0 K/uL   Monocytes Relative 12 %   Monocytes Absolute 1.0 0.1 - 1.0 K/uL   Eosinophils Relative 1 %   Eosinophils Absolute 0.0 0.0 - 0.5 K/uL   Basophils  Relative 0 %   Basophils Absolute 0.0 0.0 - 0.1 K/uL   Immature Granulocytes 1 %   Abs Immature Granulocytes 0.06 0.00 - 0.07 K/uL    Comment: Performed at [facility], [address], [place], [place] [address]  Comprehensive metabolic panel     Status: Abnormal   Collection Time: 03/16/24  7:23 PM  Result Value Ref Range   Sodium 139 135 - 145 mmol/L   Potassium 3.7 3.5 - 5.1 mmol/L   Chloride 102 98 - 111 mmol/L   CO2 27 22 - 32 mmol/L   Glucose, Bld 123 (H) 70 - 99 mg/dL    Comment: Glucose reference range applies only to samples taken after fasting for at least 8 hours.   BUN 33 (H) 8 - 23 mg/dL   Creatinine, Ser 2.59 (H) 0.61 - 1.24 mg/dL   Calcium 8.6 (L) 8.9 - 10.3 mg/dL   Total Protein 6.6 6.5 - 8.1 g/dL   Albumin 2.6 (L) 3.5 - 5.0 g/dL   AST 21 15 - 41 U/L   ALT 19 0 - 44 U/L   Alkaline Phosphatase 100 38 - 126 U/L   Total Bilirubin 1.1 0.0 - 1.2 mg/dL   GFR, Estimated 56 (L) >60 mL/min    Comment: (NOTE) Calculated using the CKD-EPI Creatinine Equation (2021)    Anion gap 10 5 - 15    Comment: Performed at [facility], [address] [place], [place] [address]  Magnesium     Status: None   Collection Time: 03/16/24  7:23 PM  Result Value Ref Range   Magnesium 2.3 1.7 - 2.4 mg/dL    Comment: Performed at [facility], [address], [place],  [place] [address]  Phosphorus     Status: None   Collection Time: 03/16/24  7:23 PM  Result Value Ref Range   Phosphorus 4.4 2.5 - 4.6 mg/dL    Comment: Performed at [facility], [address] [place], [place] [address]  TSH     Status: Abnormal   Collection Time: 03/17/24  3:38 AM  Result Value Ref Range   TSH 27.131 (H) 0.350 - 4.500 uIU/mL    Comment: Performed by a 3rd Generation assay with a functional sensitivity of <=0.01 uIU/mL. Performed at [facility], [address] [place], [place] [address]   Heparin level (unfractionated)     Status: Abnormal   Collection Time: 03/17/24   3:38 AM  Result Value Ref Range   Heparin Unfractionated >1.10 (H) 0.30 - 0.70 IU/mL    Comment: (NOTE) The clinical reportable range upper limit is being lowered to >1.10 to align with the FDA approved guidance for the current laboratory assay.  If heparin results are below expected values, and patient dosage has  been confirmed, suggest follow up testing of antithrombin III levels. Performed at [facility], [address], [place], [place] [address]   APTT     Status: Abnormal   Collection Time: 03/17/24  3:38 AM  Result Value Ref Range   aPTT 63 (H) 24 - 36 seconds    Comment:        IF BASELINE aPTT IS ELEVATED, SUGGEST PATIENT RISK ASSESSMENT BE USED TO DETERMINE APPROPRIATE ANTICOAGULANT THERAPY. Performed at [facility], [address], [place], [place] [address]   CBC with Differential/Platelet     Status: Abnormal   Collection Time: 03/17/24  3:38 AM  Result Value Ref Range   WBC 6.2 4.0 - 10.5 K/uL   RBC 4.26 4.22 - 5.81 MIL/uL   Hemoglobin 12.5 (L) 13.0 - 17.0 g/dL   HCT 29.5 62.1 - 30.8 %   MCV 93.2 80.0 - 100.0 fL   MCH 29.3 26.0 - 34.0 pg   MCHC 31.5 30.0 - 36.0 g/dL   RDW 65.7 84.6 - 96.2 %   Platelets 178 150 - 400 K/uL   nRBC 0.0 0.0 - 0.2 %   Neutrophils Relative % 76 %   Neutro Abs 4.8 1.7 - 7.7 K/uL   Lymphocytes Relative 8 %   Lymphs Abs 0.5 (L) 0.7 - 4.0 K/uL   Monocytes Relative 14 %   Monocytes Absolute 0.9 0.1 - 1.0 K/uL   Eosinophils Relative 1 %   Eosinophils Absolute 0.0 0.0 - 0.5 K/uL   Basophils Relative 0 %   Basophils Absolute 0.0 0.0 - 0.1 K/uL   Immature Granulocytes 1 %   Abs Immature Granulocytes 0.03 0.00 - 0.07 K/uL    Comment: Performed at [facility], [address], [place], [place] [address]  Comprehensive metabolic panel with GFR     Status: Abnormal   Collection Time: 03/17/24  3:38 AM  Result Value Ref Range   Sodium 138 135 - 145 mmol/L   Potassium 3.5 3.5 - 5.1 mmol/L   Chloride 103 98 - 111  mmol/L   CO2 30 22 - 32 mmol/L   Glucose, Bld 91 70 - 99 mg/dL    Comment: Glucose reference range applies only to samples taken after fasting for at least 8 hours.   BUN 38 (H) 8 - 23 mg/dL   Creatinine, Ser 1.32 (H) 0.61 - 1.24 mg/dL  Calcium 8.3 (L) 8.9 - 10.3 mg/dL   Total Protein 5.9 (L) 6.5 - 8.1 g/dL   Albumin 2.3 (L) 3.5 - 5.0 g/dL   AST 16 15 - 41 U/L   ALT 17 0 - 44 U/L   Alkaline Phosphatase 83 38 - 126 U/L   Total Bilirubin 0.8 0.0 - 1.2 mg/dL   GFR, Estimated 43 (L) >60 mL/min    Comment: (NOTE) Calculated using the CKD-EPI Creatinine Equation (2021)    Anion gap 5 5 - 15    Comment: Performed at [facility], [address] [place], [place] [address]  Phosphorus     Status: Abnormal   Collection Time: 03/17/24  3:38 AM  Result Value Ref Range   Phosphorus 4.8 (H) 2.5 - 4.6 mg/dL    Comment: Performed at [facility], [address], [place], [place] [address]  Magnesium     Status: None   Collection Time: 03/17/24  3:38 AM  Result Value Ref Range   Magnesium 2.2 1.7 - 2.4 mg/dL    Comment: Performed at [facility], [address], [place], [place] [address]  Lactate dehydrogenase     Status: None   Collection Time: 03/17/24  6:42 AM  Result Value Ref Range   LDH 110 98 - 192 U/L    Comment: Performed at [facility], [address], [place], [place] [address]  Uric acid     Status: None   Collection Time: 03/17/24  6:42 AM  Result Value Ref Range   Uric Acid, Serum 5.5 3.7 - 8.6 mg/dL    Comment: Performed at [facility], [address] [place], [place] [address]     ROS:  Pertinent items are noted in HPI.  Physical Exam: Vitals:   03/17/24 0100 03/17/24 0400  BP: 104/73 (!) 98/54  Pulse: 88 86  Resp: 19 13  Temp:    SpO2: 94% 99%     General: chronically ill appearing, NAD Neck: JVD present  Heart: irregularly irregular rhythm, no murmur on exam  Lungs: decreased breath sounds on the right lung with little air  movement, crackles appreciated at left lung base Abdomen: soft, non-tender, non-distended  Extremities: +3 peripheral edema to the knee bilaterally   Assessment/Plan: AKI: Suspect element of cardiorenal syndrome with soft blood pressures, increase in creatinine with diuresis, and pleural effusion. Although he has had appropriate response to Lasix  with 1.7 L output.    Will need to optimize cardiac output with draining pleural effusion, blood pressure support, TED hose and considering BiPAP at night. Will redose 40 mg IV Lasix  tonight with plans to hold for SBP <90. Will trend Cr with daily BMP.  Hypotension: soft BP overnight with SBP ~90. Will start with Midodrine 10 mg TID for BP support and provide one dose of albumin to help with 3rd spaced fluid. Can consider holding Carvedilol  in the setting of hypotension and possible reduced cardiac output but will need to weigh risk and benefits with controlling Afib. Right Pleural effusion: No increased WOB and was on RA on my exam sating 95%; further management per PCCM CHF exacerbation: Will await repeat echo.  Daily weight pending.  Continue strict I's and O's.  Replete electrolytes with goal potassium >4, Mag > 2. A-fib: Per primary, not currently in RVR.  Will need appropriate rate and rhythm control to optimize cardiac output.  BPH: consider restarting home finasteride . With urine output less concerned about outlet  obstruction 2/2 to BPH but will obtain renal ultrasound and post void residual to monitor for obstruction.    Clem Currier 03/17/2024, 8:18 AM

## 2024-03-17 NOTE — H&P (View-Only) (Signed)
 NAME:  Samuel Porter, MRN:  [id number], DOB:  1941-09-08, LOS: 1 ADMISSION DATE:  03/16/2024, CONSULTATION DATE:  03/17/24 REFERRING MD:  TRH, CHIEF COMPLAINT:  DOE   History of Present Illness:  83 year old man history of CHF presented from pulmonary clinic with worsening dyspnea exertion, hypoxemia, lower extremity swelling, and A-fib with RVR concerning for CHF exacerbation with recurrent right greater than left pleural effusion.  Patient about 2 weeks ago had a cardioversion for A-fib.  Presented to pulm clinic today with worsening shortness of breath for few days.  Associated with worsening lower extremity swelling.  He was unaware he was back in A-fib.  Noted to be progressive hypoxemic 89% yesterday.  82% on room air today in clinic.  Chest x-ray obtained showed reaccumulation, worsening right-sided pleural effusion compared to May on my review interpretation.  He states when he is had thoracentesis in the past that does improve his symptoms.  He admits to weight gain and lower extremity swelling as discussed above.  Prior fluid studies are borderline but appear transudative.  Lymphocyte predominance is peculiar and unexpected..  Labs today with markedly elevated pro BNP.  Pertinent  Medical History  CHF with reduced EF A-fib with RVR  Significant Hospital Events: Including procedures, antibiotic start and stop dates in addition to other pertinent events     Interim History / Subjective:  Unchanged. Feels "good." O2 same, no increase WOB  Objective    Blood pressure (!) 98/54, pulse 86, temperature (!) 97.5 F (36.4 C), temperature source Oral, resp. rate 13, height 6\' 1"  (1.854 m), weight 75.3 kg, SpO2 99%.        Intake/Output Summary (Last 24 hours) at 03/17/2024 0910 Last data filed at 03/17/2024 4098 Gross per 24 hour  Intake 603.13 ml  Output 2350 ml  Net -1746.87 ml   Filed Weights   03/16/24 1146  Weight: 75.3 kg    Examination: General: Frail elderly  gentleman lying in bed HENT: Atraumatic normocephalic temporal wasting noted Lungs: Normal work of breathing, on nasal cannula Cardiovascular: Tachycardic, irregularly irregular Abdomen: Not distended Extremities: Edema noted Neuro: No focal deficits noted  Resolved problem list   Assessment and Plan   Recurrent right pleural effusion: Initial tap at [facility] 01/2024 clearly transudate. Previously tapped 5/25, relatively bland fluid.  Suspect transudate LDH does not meet criteria, does not have a serum protein to compare but would be borderline.  He has cardiomyopathy reduced EF.  Small left pleural effusion.  Unclear why right is so much larger but certainly it is.  He improves with thoracentesis.  High suspicion related to cardiogenic fluid overload, unclear why asymmetric. BNP markedly elevated. -- Plan for chest tube 6/4, hold apixaban and let washout -- Hold apixaban, continue heparin GTT for stroke prophylaxis in the setting of A-fib, stop heparin 6 am 6/4 - order is in --Once appears drained on CXR will repeat CT to evaluate underlying lung parenchyma -- Recommend aggressive IV lasix , given renal dysfunction suspect he will need larger dose  Acute hypoxemic respiratory failure: Suspect in the setting of volume overload, CHF exacerbation given onset of symptoms dyspnea hypoxemia in concurrence with worsening lower extremity swelling.  Weight is up as well. As is BNP. -- Recommend aggressive IV diuresis per primary, appreciate cardiology/nephrology assistance  PCCM will follow.  Please contact us  if any decompensation if we need to reconsider timing of pleural intervention.  Discussed plan in detail with patient and daughter at bedside.  Best Practice (right click  and "Reselect all SmartList Selections" daily)   Per primary  Labs   CBC: Recent Labs  Lab 03/16/24 1159 03/16/24 1923 03/17/24 0338  WBC 8.3 7.8 6.2  NEUTROABS  --  6.1 4.8  HGB 14.1 13.6 12.5*  HCT 45.9 43.8 39.7   MCV 94.3 94.0 93.2  PLT 156 213 178    Basic Metabolic Panel: Recent Labs  Lab 03/12/24 0917 03/16/24 1238 03/16/24 1923 03/17/24 0338  NA 141 140 139 138  K 4.2 3.8 3.7 3.5  CL 102 102 102 103  CO2 26 27 27 30   GLUCOSE 114* 109* 123* 91  BUN 18 35* 33* 38*  CREATININE 1.06 1.22 1.27* 1.58*  CALCIUM 8.7 9.5 8.6* 8.3*  MG  --   --  2.3 2.2  PHOS  --   --  4.4 4.8*   GFR: Estimated Creatinine Clearance: 38.4 mL/min (A) (by C-G formula based on SCr of 1.58 mg/dL (H)). Recent Labs  Lab 03/16/24 1159 03/16/24 1923 03/17/24 0338  WBC 8.3 7.8 6.2    Liver Function Tests: Recent Labs  Lab 03/16/24 1923 03/17/24 0338  AST 21 16  ALT 19 17  ALKPHOS 100 83  BILITOT 1.1 0.8  PROT 6.6 5.9*  ALBUMIN 2.6* 2.3*   No results for input(s): "LIPASE", "AMYLASE" in the last 168 hours. No results for input(s): "AMMONIA" in the last 168 hours.  ABG    Component Value Date/Time   HCO3 38.1 (H) 07/11/2023 1659   TCO2 40 (H) 07/11/2023 1659   O2SAT 54 07/11/2023 1659     Coagulation Profile: No results for input(s): "INR", "PROTIME" in the last 168 hours.  Cardiac Enzymes: No results for input(s): "CKTOTAL", "CKMB", "CKMBINDEX", "TROPONINI" in the last 168 hours.  HbA1C: No results found for: "HGBA1C"  CBG: No results for input(s): "GLUCAP" in the last 168 hours.  Review of Systems:   No chest pain with exertion.  No orthopnea or PND.  Comprehensive review of systems otherwise negative.  Past Medical History:  He,  has a past medical history of Arthritis, BPH (benign prostatic hyperplasia), Cancer (HCC) (7 yrs ago), Cataracts, bilateral, Glaucoma, Hypertension, and Vitamin D  deficiency.   Surgical History:   Past Surgical History:  Procedure Laterality Date   CARDIOVERSION N/A 03/05/2024   Procedure: CARDIOVERSION;  Surgeon: Hazle Lites, MD;  Location: [facility];  Service: Cardiovascular;  Laterality: N/A;   CHOLECYSTECTOMY     colonscopy  2017    IR THORACENTESIS ASP PLEURAL SPACE W/IMG GUIDE  03/03/2024   JOINT REPLACEMENT Right    hip    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   Procedure: LEFT TOTAL HIP ARTHROPLASTY ANTERIOR APPROACH;  Surgeon: Liliane Rei, MD;  Location: [facility];  Service: Orthopedics;  Laterality: Left;   VENTRAL HERNIA REPAIR N/A 07/12/2023   Procedure: HERNIA REPAIR VENTRAL;  Surgeon: Stechschulte, Avon Boers, MD;  Location: [facility] OR;  Service: General;  Laterality: N/A;     Social History:   reports that he quit smoking about 55 years ago. His smoking use included cigarettes. He started smoking about 65 years ago. He has a 15 pack-year smoking history. He has never used smokeless tobacco. He reports that he does not currently use alcohol. He reports that he does not use drugs.   Family History:  His family history includes Cancer - Ovarian in his sister; Lymphoma in his sister.   Allergies No Known Allergies   Home Medications  Prior to Admission medications  Medication Sig Start Date End Date Taking? Authorizing Provider  acetaminophen  (TYLENOL ) 325 MG tablet Take 650 mg by mouth every 6 (six) hours as needed for moderate pain (pain score 4-6).    [provider]  albuterol  (VENTOLIN  HFA) 108 (90 Base) MCG/ACT inhaler Inhale 2 puffs into the lungs every 4 (four) hours as needed for shortness of breath. 11/22/23   [provider]  amiodarone  (PACERONE ) 200 MG tablet Take 200 mg by mouth daily.    [provider]  carvedilol  (COREG ) 3.125 MG tablet Take 1 tablet (3.125 mg total) by mouth 2 (two) times daily. 03/05/24   Hilty, Aviva Lemmings, MD  cholecalciferol  (VITAMIN D ) 1000 units tablet Take 1,000 Units by mouth daily.    [provider]  dapagliflozin  propanediol (FARXIGA ) 10 MG TABS tablet Take 1 tablet (10 mg total) by mouth daily before breakfast. 02/26/24   Arnoldo Lapping, MD  dorzolamide -timolol  (COSOPT ) 22.3-6.8 MG/ML ophthalmic solution Place 1 drop into both eyes 2 (two) times  daily. 07/09/16   [provider]  ELIQUIS 5 MG TABS tablet Take 5 mg by mouth 2 (two) times daily.    [provider]  finasteride  (PROSCAR ) 5 MG tablet Take 5 mg by mouth daily.    [provider]  furosemide  (LASIX ) 20 MG tablet Take 1 tablet (20 mg total) by mouth daily. 02/07/24 05/07/24  Cooper, Michael, MD  polyethylene glycol (MIRALAX  / GLYCOLAX ) 17 g packet Take 17 g by mouth 2 (two) times daily as needed. 07/16/23   Maczis, Michael M, PA-C  sildenafil (VIAGRA) 25 MG tablet Take 25 mg by mouth as needed for erectile dysfunction.    [provider]  spironolactone  (ALDACTONE ) 25 MG tablet Take 0.5 tablets (12.5 mg total) by mouth daily. 02/26/24   Arnoldo Lapping, MD  valsartan  (DIOVAN ) 40 MG tablet Take 1 tablet (40 mg total) by mouth 2 (two) times daily. 12/19/23   Arnoldo Lapping, MD     Critical care time: n/a    Guerry Leek, MD See Tilford Foley

## 2024-03-17 NOTE — Progress Notes (Signed)
 NAME:  Samuel Porter, MRN:  [id number], DOB:  1941-09-08, LOS: 1 ADMISSION DATE:  03/16/2024, CONSULTATION DATE:  03/17/24 REFERRING MD:  TRH, CHIEF COMPLAINT:  DOE   History of Present Illness:  83 year old man history of CHF presented from pulmonary clinic with worsening dyspnea exertion, hypoxemia, lower extremity swelling, and A-fib with RVR concerning for CHF exacerbation with recurrent right greater than left pleural effusion.  Patient about 2 weeks ago had a cardioversion for A-fib.  Presented to pulm clinic today with worsening shortness of breath for few days.  Associated with worsening lower extremity swelling.  He was unaware he was back in A-fib.  Noted to be progressive hypoxemic 89% yesterday.  82% on room air today in clinic.  Chest x-ray obtained showed reaccumulation, worsening right-sided pleural effusion compared to May on my review interpretation.  He states when he is had thoracentesis in the past that does improve his symptoms.  He admits to weight gain and lower extremity swelling as discussed above.  Prior fluid studies are borderline but appear transudative.  Lymphocyte predominance is peculiar and unexpected..  Labs today with markedly elevated pro BNP.  Pertinent  Medical History  CHF with reduced EF A-fib with RVR  Significant Hospital Events: Including procedures, antibiotic start and stop dates in addition to other pertinent events     Interim History / Subjective:  Unchanged. Feels "good." O2 same, no increase WOB  Objective    Blood pressure (!) 98/54, pulse 86, temperature (!) 97.5 F (36.4 C), temperature source Oral, resp. rate 13, height 6\' 1"  (1.854 m), weight 75.3 kg, SpO2 99%.        Intake/Output Summary (Last 24 hours) at 03/17/2024 0910 Last data filed at 03/17/2024 4098 Gross per 24 hour  Intake 603.13 ml  Output 2350 ml  Net -1746.87 ml   Filed Weights   03/16/24 1146  Weight: 75.3 kg    Examination: General: Frail elderly  gentleman lying in bed HENT: Atraumatic normocephalic temporal wasting noted Lungs: Normal work of breathing, on nasal cannula Cardiovascular: Tachycardic, irregularly irregular Abdomen: Not distended Extremities: Edema noted Neuro: No focal deficits noted  Resolved problem list   Assessment and Plan   Recurrent right pleural effusion: Initial tap at [facility] 01/2024 clearly transudate. Previously tapped 5/25, relatively bland fluid.  Suspect transudate LDH does not meet criteria, does not have a serum protein to compare but would be borderline.  He has cardiomyopathy reduced EF.  Small left pleural effusion.  Unclear why right is so much larger but certainly it is.  He improves with thoracentesis.  High suspicion related to cardiogenic fluid overload, unclear why asymmetric. BNP markedly elevated. -- Plan for chest tube 6/4, hold apixaban and let washout -- Hold apixaban, continue heparin GTT for stroke prophylaxis in the setting of A-fib, stop heparin 6 am 6/4 - order is in --Once appears drained on CXR will repeat CT to evaluate underlying lung parenchyma -- Recommend aggressive IV lasix , given renal dysfunction suspect he will need larger dose  Acute hypoxemic respiratory failure: Suspect in the setting of volume overload, CHF exacerbation given onset of symptoms dyspnea hypoxemia in concurrence with worsening lower extremity swelling.  Weight is up as well. As is BNP. -- Recommend aggressive IV diuresis per primary, appreciate cardiology/nephrology assistance  PCCM will follow.  Please contact us  if any decompensation if we need to reconsider timing of pleural intervention.  Discussed plan in detail with patient and daughter at bedside.  Best Practice (right click  and "Reselect all SmartList Selections" daily)   Per primary  Labs   CBC: Recent Labs  Lab 03/16/24 1159 03/16/24 1923 03/17/24 0338  WBC 8.3 7.8 6.2  NEUTROABS  --  6.1 4.8  HGB 14.1 13.6 12.5*  HCT 45.9 43.8 39.7   MCV 94.3 94.0 93.2  PLT 156 213 178    Basic Metabolic Panel: Recent Labs  Lab 03/12/24 0917 03/16/24 1238 03/16/24 1923 03/17/24 0338  NA 141 140 139 138  K 4.2 3.8 3.7 3.5  CL 102 102 102 103  CO2 26 27 27 30   GLUCOSE 114* 109* 123* 91  BUN 18 35* 33* 38*  CREATININE 1.06 1.22 1.27* 1.58*  CALCIUM 8.7 9.5 8.6* 8.3*  MG  --   --  2.3 2.2  PHOS  --   --  4.4 4.8*   GFR: Estimated Creatinine Clearance: 38.4 mL/min (A) (by C-G formula based on SCr of 1.58 mg/dL (H)). Recent Labs  Lab 03/16/24 1159 03/16/24 1923 03/17/24 0338  WBC 8.3 7.8 6.2    Liver Function Tests: Recent Labs  Lab 03/16/24 1923 03/17/24 0338  AST 21 16  ALT 19 17  ALKPHOS 100 83  BILITOT 1.1 0.8  PROT 6.6 5.9*  ALBUMIN 2.6* 2.3*   No results for input(s): "LIPASE", "AMYLASE" in the last 168 hours. No results for input(s): "AMMONIA" in the last 168 hours.  ABG    Component Value Date/Time   HCO3 38.1 (H) 07/11/2023 1659   TCO2 40 (H) 07/11/2023 1659   O2SAT 54 07/11/2023 1659     Coagulation Profile: No results for input(s): "INR", "PROTIME" in the last 168 hours.  Cardiac Enzymes: No results for input(s): "CKTOTAL", "CKMB", "CKMBINDEX", "TROPONINI" in the last 168 hours.  HbA1C: No results found for: "HGBA1C"  CBG: No results for input(s): "GLUCAP" in the last 168 hours.  Review of Systems:   No chest pain with exertion.  No orthopnea or PND.  Comprehensive review of systems otherwise negative.  Past Medical History:  He,  has a past medical history of Arthritis, BPH (benign prostatic hyperplasia), Cancer (HCC) (7 yrs ago), Cataracts, bilateral, Glaucoma, Hypertension, and Vitamin D  deficiency.   Surgical History:   Past Surgical History:  Procedure Laterality Date   CARDIOVERSION N/A 03/05/2024   Procedure: CARDIOVERSION;  Surgeon: Hazle Lites, MD;  Location: [facility]AS[facility];  Service: Cardiovascular;  Laterality: N/A;   CHOLECYSTECTOMY     colonscopy  2017    IR THORACENTESIS ASP PLEURAL SPACE W/IMG GUIDE  03/03/2024   JOINT REPLACEMENT Right    hip    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   Procedure: LEFT TOTAL HIP ARTHROPLASTY ANTERIOR APPROACH;  Surgeon: Liliane Rei, MD;  Location: [facility] OR[facility];  Service: Orthopedics;  Laterality: Left;   VENTRAL HERNIA REPAIR N/A 07/12/2023   Procedure: HERNIA REPAIR VENTRAL;  Surgeon: Stechschulte, Avon Boers, MD;  Location: [facility] OR;  Service: General;  Laterality: N/A;     Social History:   reports that he quit smoking about 55 years ago. His smoking use included cigarettes. He started smoking about 65 years ago. He has a 15 pack-year smoking history. He has never used smokeless tobacco. He reports that he does not currently use alcohol. He reports that he does not use drugs.   Family History:  His family history includes Cancer - Ovarian in his sister; Lymphoma in his sister.   Allergies No Known Allergies   Home Medications  Prior to Admission medications  Medication Sig Start Date End Date Taking? Authorizing Provider  acetaminophen  (TYLENOL ) 325 MG tablet Take 650 mg by mouth every 6 (six) hours as needed for moderate pain (pain score 4-6).    [provider]  albuterol  (VENTOLIN  HFA) 108 (90 Base) MCG/ACT inhaler Inhale 2 puffs into the lungs every 4 (four) hours as needed for shortness of breath. 11/22/23   [provider]  amiodarone  (PACERONE ) 200 MG tablet Take 200 mg by mouth daily.    [provider]  carvedilol  (COREG ) 3.125 MG tablet Take 1 tablet (3.125 mg total) by mouth 2 (two) times daily. 03/05/24   Hilty, Aviva Lemmings, MD  cholecalciferol  (VITAMIN D ) 1000 units tablet Take 1,000 Units by mouth daily.    [provider]  dapagliflozin  propanediol (FARXIGA ) 10 MG TABS tablet Take 1 tablet (10 mg total) by mouth daily before breakfast. 02/26/24   Arnoldo Lapping, MD  dorzolamide -timolol  (COSOPT ) 22.3-6.8 MG/ML ophthalmic solution Place 1 drop into both eyes 2 (two) times  daily. 07/09/16   [provider]  ELIQUIS 5 MG TABS tablet Take 5 mg by mouth 2 (two) times daily.    [provider]  finasteride  (PROSCAR ) 5 MG tablet Take 5 mg by mouth daily.    [provider]  furosemide  (LASIX ) 20 MG tablet Take 1 tablet (20 mg total) by mouth daily. 02/07/24 05/07/24  Cooper, Michael, MD  polyethylene glycol (MIRALAX  / GLYCOLAX ) 17 g packet Take 17 g by mouth 2 (two) times daily as needed. 07/16/23   Maczis, Michael M, PA-C  sildenafil (VIAGRA) 25 MG tablet Take 25 mg by mouth as needed for erectile dysfunction.    [provider]  spironolactone  (ALDACTONE ) 25 MG tablet Take 0.5 tablets (12.5 mg total) by mouth daily. 02/26/24   Arnoldo Lapping, MD  valsartan  (DIOVAN ) 40 MG tablet Take 1 tablet (40 mg total) by mouth 2 (two) times daily. 12/19/23   Arnoldo Lapping, MD     Critical care time: n/a    Guerry Leek, MD See Tilford Foley

## 2024-03-17 NOTE — Plan of Care (Signed)
 progressing

## 2024-03-18 ENCOUNTER — Inpatient Hospital Stay (HOSPITAL_COMMUNITY)

## 2024-03-18 ENCOUNTER — Encounter (HOSPITAL_COMMUNITY)
Admission: EM | Disposition: A | Discharge status: Home Health | Payer: Self-pay | Source: Ambulatory Visit | Attending: Internal Medicine

## 2024-03-18 ENCOUNTER — Encounter (HOSPITAL_COMMUNITY): Payer: Self-pay | Admitting: Internal Medicine

## 2024-03-18 DIAGNOSIS — I5043 Acute on chronic combined systolic (congestive) and diastolic (congestive) heart failure: Secondary | ICD-10-CM

## 2024-03-18 DIAGNOSIS — J90 Pleural effusion, not elsewhere classified: Secondary | ICD-10-CM | POA: Diagnosis not present

## 2024-03-18 DIAGNOSIS — I4819 Other persistent atrial fibrillation: Secondary | ICD-10-CM

## 2024-03-18 DIAGNOSIS — E8809 Other disorders of plasma-protein metabolism, not elsewhere classified: Secondary | ICD-10-CM

## 2024-03-18 DIAGNOSIS — J9601 Acute respiratory failure with hypoxia: Secondary | ICD-10-CM | POA: Diagnosis not present

## 2024-03-18 DIAGNOSIS — E8770 Fluid overload, unspecified: Secondary | ICD-10-CM

## 2024-03-18 DIAGNOSIS — E8779 Other fluid overload: Secondary | ICD-10-CM | POA: Diagnosis not present

## 2024-03-18 HISTORY — PX: CHEST TUBE INSERTION: SHX231

## 2024-03-18 LAB — CBC WITH DIFFERENTIAL/PLATELET
Abs Immature Granulocytes: 0.05 10*3/uL (ref 0.00–0.07)
Basophils Absolute: 0 10*3/uL (ref 0.0–0.1)
Basophils Relative: 0 %
Eosinophils Absolute: 0.1 10*3/uL (ref 0.0–0.5)
Eosinophils Relative: 1 %
HCT: 39.7 % (ref 39.0–52.0)
Hemoglobin: 12.2 g/dL — ABNORMAL LOW (ref 13.0–17.0)
Immature Granulocytes: 1 %
Lymphocytes Relative: 8 %
Lymphs Abs: 0.5 10*3/uL — ABNORMAL LOW (ref 0.7–4.0)
MCH: 29 pg (ref 26.0–34.0)
MCHC: 30.7 g/dL (ref 30.0–36.0)
MCV: 94.3 fL (ref 80.0–100.0)
Monocytes Absolute: 1.1 10*3/uL — ABNORMAL HIGH (ref 0.1–1.0)
Monocytes Relative: 17 %
Neutro Abs: 4.7 10*3/uL (ref 1.7–7.7)
Neutrophils Relative %: 73 %
Platelets: 174 10*3/uL (ref 150–400)
RBC: 4.21 MIL/uL — ABNORMAL LOW (ref 4.22–5.81)
RDW: 14.4 % (ref 11.5–15.5)
WBC: 6.4 10*3/uL (ref 4.0–10.5)
nRBC: 0 % (ref 0.0–0.2)

## 2024-03-18 LAB — BODY FLUID CELL COUNT WITH DIFFERENTIAL
Eos, Fluid: 1 %
Lymphs, Fluid: 91 %
Monocyte-Macrophage-Serous Fluid: 2 % — ABNORMAL LOW (ref 50–90)
Neutrophil Count, Fluid: 6 % (ref 0–25)
Total Nucleated Cell Count, Fluid: 1319 uL — ABNORMAL HIGH (ref 0–1000)

## 2024-03-18 LAB — URINALYSIS, ROUTINE W REFLEX MICROSCOPIC
Bilirubin Urine: NEGATIVE
Glucose, UA: 100 mg/dL — AB
Hgb urine dipstick: NEGATIVE
Ketones, ur: NEGATIVE mg/dL
Leukocytes,Ua: NEGATIVE
Nitrite: NEGATIVE
Protein, ur: NEGATIVE mg/dL
Specific Gravity, Urine: 1.02 (ref 1.005–1.030)
pH: 5.5 (ref 5.0–8.0)

## 2024-03-18 LAB — MAGNESIUM: Magnesium: 2.2 mg/dL (ref 1.7–2.4)

## 2024-03-18 LAB — BASIC METABOLIC PANEL WITH GFR
Anion gap: 7 (ref 5–15)
BUN: 48 mg/dL — ABNORMAL HIGH (ref 8–23)
CO2: 29 mmol/L (ref 22–32)
Calcium: 8.7 mg/dL — ABNORMAL LOW (ref 8.9–10.3)
Chloride: 101 mmol/L (ref 98–111)
Creatinine, Ser: 1.34 mg/dL — ABNORMAL HIGH (ref 0.61–1.24)
GFR, Estimated: 53 mL/min — ABNORMAL LOW (ref >60)
Glucose, Bld: 101 mg/dL — ABNORMAL HIGH (ref 70–99)
Potassium: 3.5 mmol/L (ref 3.5–5.1)
Sodium: 137 mmol/L (ref 135–145)

## 2024-03-18 LAB — BRAIN NATRIURETIC PEPTIDE: B Natriuretic Peptide: 1029.3 pg/mL — ABNORMAL HIGH (ref 0.0–100.0)

## 2024-03-18 LAB — HEPARIN LEVEL (UNFRACTIONATED): Heparin Unfractionated: >1.1 [IU]/mL — ABNORMAL HIGH (ref 0.30–0.70)

## 2024-03-18 LAB — C-REACTIVE PROTEIN: CRP: 3.9 mg/dL — ABNORMAL HIGH (ref <1.0)

## 2024-03-18 LAB — PROTEIN, PLEURAL OR PERITONEAL FLUID: Total protein, fluid: 3.2 g/dL

## 2024-03-18 LAB — ALBUMIN, PLEURAL OR PERITONEAL FLUID: Albumin, Fluid: 1.5 g/dL

## 2024-03-18 LAB — LACTATE DEHYDROGENASE, PLEURAL OR PERITONEAL FLUID: LD, Fluid: 131 U/L — ABNORMAL HIGH (ref 3–23)

## 2024-03-18 LAB — PROTEIN / CREATININE RATIO, URINE
Creatinine, Urine: 47 mg/dL
Protein Creatinine Ratio: 0.21 mg/mg{creat} — ABNORMAL HIGH (ref 0.00–0.15)
Total Protein, Urine: 10 mg/dL

## 2024-03-18 LAB — PHOSPHORUS: Phosphorus: 4.5 mg/dL (ref 2.5–4.6)

## 2024-03-18 LAB — APTT: aPTT: 106 s — ABNORMAL HIGH (ref 24–36)

## 2024-03-18 LAB — PROCALCITONIN: Procalcitonin: <0.1 ng/mL

## 2024-03-18 SURGERY — CHEST TUBE INSERTION
Anesthesia: LOCAL | Laterality: Right

## 2024-03-18 MED ORDER — ALBUMIN HUMAN 5 % IV SOLN
12.5000 g | Freq: Once | INTRAVENOUS | Status: AC
  Administered 2024-03-18: 12.5 g via INTRAVENOUS
  Filled 2024-03-18: qty 250

## 2024-03-18 MED ORDER — FUROSEMIDE 10 MG/ML IJ SOLN
40.0000 mg | Freq: Once | INTRAMUSCULAR | Status: AC
  Administered 2024-03-18: 40 mg via INTRAVENOUS
  Filled 2024-03-18: qty 4

## 2024-03-18 MED ORDER — ALBUTEROL SULFATE (2.5 MG/3ML) 0.083% IN NEBU
3.0000 mL | INHALATION_SOLUTION | Freq: Four times a day (QID) | RESPIRATORY_TRACT | Status: DC | PRN
  Administered 2024-03-18: 3 mL via RESPIRATORY_TRACT
  Filled 2024-03-18: qty 3

## 2024-03-18 MED ORDER — HEPARIN (PORCINE) 25000 UT/250ML-% IV SOLN
1200.0000 [IU]/h | INTRAVENOUS | Status: AC
  Administered 2024-03-18: 1200 [IU]/h via INTRAVENOUS
  Filled 2024-03-18: qty 250

## 2024-03-18 NOTE — Progress Notes (Signed)
 Rounding Note    Patient Name: Samuel Porter Date of Encounter: 03/18/2024  [facility] Cardiologist: Arnoldo Lapping, MD   Subjective   No acute events overnight. Breathing unchanged. Pending chest tube later today.  Inpatient Medications    Scheduled Meds:  (feeding supplement) PROSource Plus  30 mL Oral BID BM   cholecalciferol   1,000 Units Oral Daily   docusate sodium   100 mg Oral BID   dorzolamide -timolol   1 drop Both Eyes BID   levothyroxine  50 mcg Oral Q0600   metoprolol  succinate  25 mg Oral QPM   midodrine  10 mg Oral TID WC   Continuous Infusions:  amiodarone  30 mg/hr (03/18/24 0911)   PRN Meds: acetaminophen  **OR** acetaminophen , albuterol , bisacodyl , hydrALAZINE, ondansetron  **OR** ondansetron  (ZOFRAN ) IV, oxyCODONE , polyethylene glycol   Vital Signs    Vitals:   03/18/24 0400 03/18/24 0500 03/18/24 0800 03/18/24 0900  BP: (!) 92/57  (!) 104/93 105/71  Pulse: 78  (!) 101 93  Resp: 13  20 20   Temp:   (!) 97.3 F (36.3 C)   TempSrc:   Axillary   SpO2: 98%  95% 97%  Weight:  77.8 kg    Height:        Intake/Output Summary (Last 24 hours) at 03/18/2024 1020 Last data filed at 03/18/2024 0911 Gross per 24 hour  Intake 1348.76 ml  Output 1350 ml  Net -1.24 ml      03/18/2024    5:00 AM 03/16/2024   11:46 AM 03/16/2024   10:05 AM  Last 3 Weights  Weight (lbs) 171 lb 8.3 oz 166 lb 166 lb 9.6 oz  Weight (kg) 77.8 kg 75.297 kg 75.569 kg      Telemetry    Rate controlled atrial fibrillation with Pvcs vs aberrant conduction - Personally Reviewed  Physical Exam   GEN: Frail, thin gentleman NECK: JVD elevated to mid neck at 45 degrees CARDIAC: irregularly irregular rhythm, normal S1 and S2, no rubs or gallops. No murmur. VASCULAR: Radial pulses 2+ bilaterally.  RESPIRATORY:  largely clear on left, slightly diminished L base, diminished breath sounds on R in mid and lower lobes. ABDOMEN: Soft, non-tender, non-distended MUSCULOSKELETAL:  Moves  all 4 limbs independently SKIN: Warm and dry, 1+ bilateral LE edema NEUROLOGIC:  No focal neuro deficits noted. PSYCHIATRIC:  Normal affect    New pertinent results (labs, ECG, imaging, cardiac studies)    Reviewed below  Assessment & Plan    Hypervolemia/third spacing Recurrent pleural effusion Hypoalbuminemia -see my extensive comments from 6/3. Overall complex picture. Not completely consistent with cardiac etiology, given that he has had mainly unilateral R pleural effusion, and there was significant third spacing (pleural, pericardial, and ascites fluid) seen on prior echo while right atrial pressure was normal at 3. -this suggests that diuresis may be difficult. Risk is for intravascular depletion without significantly changing third spaced fluid. Nephrology has been consulted, patient has also received albumin. -prior echo visually not concerning for end stage amyloid, which is what would be expected for this degree of volume overload. Tissue doppler velocities, LV thickness/appearance do not suggest advanced cardiac amyloid. If there is systemic concern for amyloid, defer workup to primary team. Discussed with family and patient yesterday--while we cannot exclude early cardiac amyloid, it would not be expected to have this degree of volume overload. -CRP is elevated, suggests systemic inflammatory process also involved. Procalcitonin <0.1, suggests against infection  Hypotension: now on midodrine for support. All outpatient antihypertensives on hold.  Acute on chronic systolic and diastolic heart failure: as above, difficult situation. EF likely affected by afib, and as noted here, will be difficult to keep him out of afib with ongoing issues. No blood pressure room for GDMT. Diuresis per nephrology, as above. Can consider limited echo this admission, though with chest tube and afib images may be technically challenging  Paroxysmal vs persistent atrial fibrillation: cardioverted 5/22,  needs uninterrupted anticoagulation for at least 4 weeks post procedure, on heparin drip pending chest tube, restart DOAC when able from pulm perspective. -we discussed that given recurrent pleural effusions and the inflammation/irritation associated with that, may be difficult to keep him out of atrial fibrillation at this time. -Blood pressure too low for beta blockers, no calcium channel blockers given reduced EF, with AKI avoiding digoxin. 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 High complexity medical decision making.    Signed, Sheryle Donning, MD  03/18/2024, 10:20 AM

## 2024-03-18 NOTE — Procedures (Signed)
 Insertion of Chest Tube Procedure Note  Samuel Porter  [id number]  11/15/1940  Date:03/18/24  Time:1:44 PM    Provider Performing: Sula End   Procedure: Chest Tube Insertion [phone])  Indication(s) Effusion  Consent Risks of the procedure as well as the alternatives and risks of each were explained to the patient and/or caregiver.  Consent for the procedure was obtained and is signed in the bedside chart  Anesthesia Topical only with 1% lidocaine     Time Out Verified patient identification, verified procedure, site/side was marked, verified correct patient position, special equipment/implants available, medications/allergies/relevant history reviewed, required imaging and test results available.   Sterile Technique Maximal sterile technique including full sterile barrier drape, hand hygiene, sterile gown, sterile gloves, mask, hair covering, sterile ultrasound probe cover (if used).   Procedure Description Ultrasound used to identify appropriate pleural anatomy for placement and overlying skin marked. Area of placement cleaned and draped in sterile fashion.  A 14 French pigtail pleural catheter was placed into the right pleural space using Seldinger technique. Appropriate return of fluid was obtained.  The tube was connected to atrium and placed on -20 cm H2O wall suction.   Complications/Tolerance None; patient tolerated the procedure well. Chest X-ray is ordered to verify placement.   EBL Minimal  Specimen(s) fluid   Rey Catholic AGACNP-BC   [facility] 03/18/2024, 1:45 PM  Please see [email or web address] for pager details.  From 7A-7P if no response, please call [phone]. After hours, please call ELink [phone].

## 2024-03-18 NOTE — Progress Notes (Signed)
 NAME:  Samuel Porter, MRN:  [id number], DOB:  1941/05/09, LOS: 2 ADMISSION DATE:  03/16/2024, CONSULTATION DATE:  03/18/24 REFERRING MD:  TRH, CHIEF COMPLAINT:  DOE   History of Present Illness:  83 year old man history of CHF presented from pulmonary clinic with worsening dyspnea exertion, hypoxemia, lower extremity swelling, and A-fib with RVR concerning for CHF exacerbation with recurrent right greater than left pleural effusion.  Patient about 2 weeks ago had a cardioversion for A-fib.  Presented to pulm clinic today with worsening shortness of breath for few days.  Associated with worsening lower extremity swelling.  He was unaware he was back in A-fib.  Noted to be progressive hypoxemic 89% yesterday.  82% on room air today in clinic.  Chest x-ray obtained showed reaccumulation, worsening right-sided pleural effusion compared to May on my review interpretation.  He states when he is had thoracentesis in the past that does improve his symptoms.  He admits to weight gain and lower extremity swelling as discussed above.  Prior fluid studies are borderline but appear transudative.  Lymphocyte predominance is peculiar and unexpected..  Labs today with markedly elevated pro BNP.  Pertinent  Medical History  CHF with reduced EF A-fib with RVR  Significant Hospital Events: Including procedures, antibiotic start and stop dates in addition to other pertinent events     Interim History / Subjective:  Unchanged.  O2 same, no increase WOB  Objective    Blood pressure 99/70, pulse 81, temperature (!) 97.4 F (36.3 C), temperature source Oral, resp. rate 20, height 6\' 1"  (1.854 m), weight 77.8 kg, SpO2 99%.        Intake/Output Summary (Last 24 hours) at 03/18/2024 1655 Last data filed at 03/18/2024 1500 Gross per 24 hour  Intake 1556.77 ml  Output 3975 ml  Net -2418.23 ml   Filed Weights   03/16/24 1146 03/18/24 0500  Weight: 75.3 kg 77.8 kg    Examination: General: Frail elderly  gentleman lying in stretcher HENT: Atraumatic normocephalic temporal wasting noted Lungs: Normal work of breathing, on nasal cannula Cardiovascular: Tachycardic, irregularly irregular Abdomen: Not distended Extremities: Edema noted Neuro: No focal deficits noted  Resolved problem list   Assessment and Plan   Recurrent right pleural effusion: Initial tap at [facility] 01/2024 clearly transudate. Previously tapped 5/25, relatively bland fluid.  He has cardiomyopathy reduced EF, severely dilated LA, dilate RA.  Small left pleural effusion, bilateral.  Unclear why right is so much larger but certainly it is.  He improves symptomatically with thoracentesis.  High suspicion related to cardiogenic fluid overload, unclear why asymmetric. BNP markedly elevated. 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 Sighs of RV volume overload on exam. -- Chest tube placed 6/4, f/u xray fluid is totally drained -- Follow up pleural fluid studies  Afib: --Resume oral AC as per primary  Acute hypoxemic respiratory failure: Suspect in the setting of volume overload, CHF exacerbation given onset of symptoms dyspnea hypoxemia in concurrence with worsening lower extremity swelling.  Weight is up as well. As is BNP. -- Diurese as able --Plan repeat CT chest in coming days  PTX ex vacuo: Lack of lung expansion after chest tube placement likely lung entrapment due to chronic nature of effusion.   Best Practice (right click and "Reselect all SmartList Selections" daily)   Per primary  Labs   CBC: Recent Labs  Lab 03/16/24 1159 03/16/24 1923 03/17/24 0338 03/18/24 0332  WBC 8.3 7.8 6.2 6.4  NEUTROABS  --  6.1 4.8 4.7  HGB 14.1 13.6 12.5* 12.2*  HCT 45.9 43.8 39.7 39.7  MCV 94.3 94.0 93.2 94.3  PLT 156 213 178 174    Basic Metabolic Panel: Recent Labs  Lab 03/12/24 0917 03/16/24 1238 03/16/24 1923 03/17/24 0338 03/18/24 0332  NA 141 140 139 138 137  K 4.2 3.8 3.7 3.5 3.5  CL 102 102 102  103 101  CO2 26 27 27 30 29   GLUCOSE 114* 109* 123* 91 101*  BUN 18 35* 33* 38* 48*  CREATININE 1.06 1.22 1.27* 1.58* 1.34*  CALCIUM 8.7 9.5 8.6* 8.3* 8.7*  MG  --   --  2.3 2.2 2.2  PHOS  --   --  4.4 4.8* 4.5   GFR: Estimated Creatinine Clearance: 46.8 mL/min (A) (by C-G formula based on SCr of 1.34 mg/dL (H)). Recent Labs  Lab 03/16/24 1159 03/16/24 1923 03/17/24 0338 03/18/24 0332  PROCALCITON  --   --   --  <0.10  WBC 8.3 7.8 6.2 6.4    Liver Function Tests: Recent Labs  Lab 03/16/24 1923 03/17/24 0338  AST 21 16  ALT 19 17  ALKPHOS 100 83  BILITOT 1.1 0.8  PROT 6.6 5.9*  ALBUMIN 2.6* 2.3*   No results for input(s): "LIPASE", "AMYLASE" in the last 168 hours. No results for input(s): "AMMONIA" in the last 168 hours.  ABG    Component Value Date/Time   HCO3 38.1 (H) 07/11/2023 1659   TCO2 40 (H) 07/11/2023 1659   O2SAT 54 07/11/2023 1659     Coagulation Profile: No results for input(s): "INR", "PROTIME" in the last 168 hours.  Cardiac Enzymes: No results for input(s): "CKTOTAL", "CKMB", "CKMBINDEX", "TROPONINI" in the last 168 hours.  HbA1C: No results found for: "HGBA1C"  CBG: No results for input(s): "GLUCAP" in the last 168 hours.  Review of Systems:   No chest pain with exertion.  No orthopnea or PND.  Comprehensive review of systems otherwise negative.  Past Medical History:  He,  has a past medical history of Arthritis, BPH (benign prostatic hyperplasia), Cancer (HCC) (7 yrs ago), Cataracts, bilateral, Glaucoma, Hypertension, and Vitamin D  deficiency.   Surgical History:   Past Surgical History:  Procedure Laterality Date   CARDIOVERSION N/A 03/05/2024   Procedure: CARDIOVERSION;  Surgeon: Hazle Lites, MD;  Location: [facility] INVASIVE CV LAB;  Service: Cardiovascular;  Laterality: N/A;   CHOLECYSTECTOMY     colonscopy  2017   IR THORACENTESIS ASP PLEURAL SPACE W/IMG GUIDE  03/03/2024   JOINT REPLACEMENT Right    hip    TOTAL HIP  ARTHROPLASTY Left 10/31/2016   Procedure: LEFT TOTAL HIP ARTHROPLASTY ANTERIOR APPROACH;  Surgeon: Liliane Rei, MD;  Location: [facility] ORS;  Service: Orthopedics;  Laterality: Left;   VENTRAL HERNIA REPAIR N/A 07/12/2023   Procedure: HERNIA REPAIR VENTRAL;  Surgeon: Stechschulte, Avon Boers, MD;  Location: [facility] OR;  Service: General;  Laterality: N/A;     Social History:   reports that he quit smoking about 55 years ago. His smoking use included cigarettes. He started smoking about 65 years ago. He has a 15 pack-year smoking history. He has never used smokeless tobacco. He reports that he does not currently use alcohol. He reports that he does not use drugs.   Family History:  His family history includes Cancer - Ovarian in his sister; Lymphoma in his sister.   Allergies No Known Allergies   Home Medications  Prior to Admission medications   Medication Sig Start  Date End Date Taking? Authorizing Provider  acetaminophen  (TYLENOL ) 325 MG tablet Take 650 mg by mouth every 6 (six) hours as needed for moderate pain (pain score 4-6).    [provider]  albuterol  (VENTOLIN  HFA) 108 (90 Base) MCG/ACT inhaler Inhale 2 puffs into the lungs every 4 (four) hours as needed for shortness of breath. 11/22/23   [provider]  amiodarone  (PACERONE ) 200 MG tablet Take 200 mg by mouth daily.    [provider]  carvedilol  (COREG ) 3.125 MG tablet Take 1 tablet (3.125 mg total) by mouth 2 (two) times daily. 03/05/24   Hilty, Aviva Lemmings, MD  cholecalciferol  (VITAMIN D ) 1000 units tablet Take 1,000 Units by mouth daily.    [provider]  dapagliflozin  propanediol (FARXIGA ) 10 MG TABS tablet Take 1 tablet (10 mg total) by mouth daily before breakfast. 02/26/24   Arnoldo Lapping, MD  dorzolamide -timolol  (COSOPT ) 22.3-6.8 MG/ML ophthalmic solution Place 1 drop into both eyes 2 (two) times daily. 07/09/16   [provider]  ELIQUIS 5 MG TABS tablet Take 5 mg by mouth 2 (two) times  daily.    [provider]  finasteride  (PROSCAR ) 5 MG tablet Take 5 mg by mouth daily.    [provider]  furosemide  (LASIX ) 20 MG tablet Take 1 tablet (20 mg total) by mouth daily. 02/07/24 05/07/24  Cooper, Michael, MD  polyethylene glycol (MIRALAX  / GLYCOLAX ) 17 g packet Take 17 g by mouth 2 (two) times daily as needed. 07/16/23   Maczis, Michael M, PA-C  sildenafil (VIAGRA) 25 MG tablet Take 25 mg by mouth as needed for erectile dysfunction.    [provider]  spironolactone  (ALDACTONE ) 25 MG tablet Take 0.5 tablets (12.5 mg total) by mouth daily. 02/26/24   Arnoldo Lapping, MD  valsartan  (DIOVAN ) 40 MG tablet Take 1 tablet (40 mg total) by mouth 2 (two) times daily. 12/19/23   Arnoldo Lapping, MD     Critical care time: n/a    Guerry Leek, MD See Tilford Foley

## 2024-03-18 NOTE — Progress Notes (Signed)
 PHARMACY - ANTICOAGULATION CONSULT NOTE  Pharmacy Consult for heparin Indication: atrial fibrillation  No Known Allergies  Patient Measurements: Height: 6\' 1"  (185.4 cm) Weight: 77.8 kg (171 lb 8.3 oz) IBW/kg (Calculated) : 79.9 HEPARIN DW (KG): 75.3  Vital Signs: Temp: 97.4 F (36.3 C) (06/04 1425) Temp Source: Oral (06/04 1425) BP: 105/69 (06/04 1500) Pulse Rate: 87 (06/04 1500)  Labs: Recent Labs    03/16/24 1923 03/17/24 0338 03/18/24 0332  HGB 13.6 12.5* 12.2*  HCT 43.8 39.7 39.7  PLT 213 178 174  APTT  --  63* 106*  HEPARINUNFRC  --  >1.10* >1.10*  CREATININE 1.27* 1.58* 1.34*    Estimated Creatinine Clearance: 46.8 mL/min (A) (by C-G formula based on SCr of 1.34 mg/dL (H)).   Medical History: Past Medical History:  Diagnosis Date   Arthritis    oa   BPH (benign prostatic hyperplasia)    Cancer (HCC) 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   Cataracts, bilateral    Glaucoma    Hypertension    Vitamin D  deficiency       Assessment: 83 yo M on apixaban pta for afib, now on hold for thoracentesis. Last dose 6/2 8am per pt. Pharmacy consulted for heparin.    AM PTT 63 seconds, heparin level falsely elevated  PM Update: Heparin has been off for chest tube placement this afternoon. Chest tube placed 13:44PM. Restarting Heparin 8 hours post placement per discussion with Rey Catholic, NP. Last aPTT prior to stopping was prolonged at 106.   Goal of Therapy:  Heparin level 0.3-0.7 units/ml aPTT 66-102 seconds Monitor platelets by anticoagulation protocol: Yes   Plan:  Restart IV Heparin 1200 units/hr at 2145 PM.  F/u aPTT until correlates with heparin level  Monitor daily aPTT, heparin level, CBC, signs/symptoms of bleeding  F/u restart apixaban   Thank you. Lenard Quam, PharmD, BCPS, BCCCP Please refer to [facility] for [facility] Pharmacy numbers 03/18/2024, 3:49 PM

## 2024-03-18 NOTE — Plan of Care (Signed)
 Progressing

## 2024-03-18 NOTE — Progress Notes (Signed)
 Notified Dr. Marygrace Snellen and Dr. Zelda Hickman that radiology called and chest xray shows 35% pneumothorax. Both MD's acknowledged and Dr. Marygrace Snellen gave order to place chest tube to suction.

## 2024-03-18 NOTE — Progress Notes (Signed)
 PROGRESS NOTE                                                                                                                                                                                                             Patient Demographics:    Samuel Porter, is a 83 y.o. male, DOB - March 15, 1941, VWU:[id number]  Outpatient Primary MD for the patient is [name], MD    LOS - 2  Admit date - 03/16/2024    Chief Complaint  Patient presents with   Shortness of Breath       Brief Narrative (HPI from H&P)   83 y.o. male with medical history significant for but not limited to BPH, glaucoma, hypertension, vitamin D  deficiency, atrial fibrillation on anticoagulation with recent cardioversion about 2 weeks ago, history of pleural effusions and other comorbidities who presents with worsening shortness of breath last 3 days.  Patient states that he started developing lower extremity edema that progressively got worse as well as worsening shortness of breath that started 3 days ago.  He states that he was at home and is monitoring his O2 saturations and his O2 saturation for 3 days were about 95% on room air and subsequently continued to drop over the 3 days.  Patient went to go see his pulmonologist today and is found to be hypoxic on room air so they directly admitted him to hospital for further evaluation.   In the ER workup consistent with A-fib RVR, acute on chronic diastolic CHF, recurrent pleural effusion right more than left, he also had hypoxic respiratory failure and was admitted to the hospital.     Subjective:   Patient in bed, appears comfortable, denies any headache, no fever, no chest pain or pressure, no shortness of breath , no abdominal pain. No focal weakness.   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   Patient currently on 3 to 4 L nasal cannula oxygen, pulmonary on board contemplating thoracentesis in a day or 2, Eliquis held for washout, supplemental oxygen, up in chair with I-S and flutter valve for pulmonary toiletry, for management of pulmonary issues to PCCM team.    Paroxysmal A-fib and  RVR, acute on chronic systolic heart failure EF 35%.  Currently on amiodarone  drip for rate control, Eliquis held for washout by pulmonary, cardiology consulted, will defer cardiac issues to the cardiology team.  Monitor on telemetry.  Low-dose beta-blocker if tolerated by blood pressure, blood pressure and renal function precludes the use of ACE/ARB or Entresto.  Recurrent effusion right more than left.  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  Elevated troponin: Mildly flattened in setting of A-fib with RVR and acute on chronic CHF.  And is flat, cardiology on board.  Chest pain-free.   AKI.  Nephrology consulted.  Defer management to them including fluid management.  Glaucoma: Continue home eyedrops   BPH: Currently holding his home finasteride    Essential hypertension/currently hypotensive: Continue blood pressure low, hold beta-blocker, midodrine added, albumin, Prosource, as needed IV hydralazine, monitor.  TSH is extremely high started on Synthroid, will random a.m. cortisol.   Hypoalbuminemia: Patient's Albumin Level is now 2.6. CTM and Trend and repeat CMP in the AM, placed on Prosource.   Hypothyroidism.  TSH greater than 25.   Check free T4 initiate Synthroid.       Condition - Extremely Guarded  Family Communication  : Daughter bedside 03/17/2024  Code Status : Full code  Consults  : Pulmonary, cardiology, nephrology  PUD Prophylaxis :     Procedures  :           Disposition Plan  :    Status is: Inpatient   DVT Prophylaxis  :    Place TED hose Start: 03/17/24 1010 SCDs Start: 03/16/24 1742   Lab Results  Component Value Date   PLT 174 03/18/2024    Diet :  Diet Order             Diet Heart Room service appropriate? Yes; Fluid consistency: Thin; Fluid restriction: 1500 mL Fluid  Diet effective now                    Inpatient Medications  Scheduled Meds:  (feeding supplement) PROSource Plus  30 mL Oral BID BM   cholecalciferol   1,000 Units Oral Daily   docusate sodium   100 mg Oral BID   dorzolamide -timolol   1 drop Both Eyes BID   furosemide   40 mg Intravenous Once   levothyroxine  50 mcg Oral Q0600   metoprolol  succinate  25 mg Oral QPM   midodrine  10 mg Oral TID WC   Continuous Infusions:  albumin human     amiodarone  30 mg/hr (03/18/24 0023)   PRN Meds:.acetaminophen  **OR** acetaminophen , bisacodyl , hydrALAZINE, ondansetron  **OR** ondansetron  (ZOFRAN ) IV, oxyCODONE , polyethylene glycol  Antibiotics  :    Anti-infectives (From admission, onward)    None         Objective:   Vitals:   03/17/24 2008 03/18/24 0000 03/18/24 0400 03/18/24 0500  BP: 110/76 95/67 (!) 92/57   Pulse:  70 78   Resp:  14 13   Temp: (!) 97.3 F (36.3 C) 98 F (36.7 C)    TempSrc: Oral Oral    SpO2:  99% 98%  Weight:    77.8 kg  Height:        Wt Readings from Last 3 Encounters:  03/18/24 77.8 kg  03/16/24 75.6 kg  03/05/24 73 kg     Intake/Output Summary (Last 24 hours) at 03/18/2024 0815 Last data filed at 03/18/2024 6578 Gross per 24 hour  Intake 1493.18 ml  Output 1250 ml  Net 243.18 ml     Physical Exam  Awake Alert, No new F.N deficits, Normal  affect [place].AT,PERRAL Supple Neck, No JVD,   Symmetrical Chest wall movement, Good air movement bilaterally, CTAB RRR,No Gallops,Rubs or new Murmurs,  +ve B.Sounds, Abd Soft, No tenderness,   No Cyanosis, Clubbing or edema     Data Review:    Recent Labs  Lab 03/16/24 1159 03/16/24 1923 03/17/24 0338 03/18/24 0332  WBC 8.3 7.8 6.2 6.4  HGB 14.1 13.6 12.5* 12.2*  HCT 45.9 43.8 39.7 39.7  PLT 156 213 178 174  MCV 94.3 94.0 93.2 94.3  MCH 29.0 29.2 29.3 29.0  MCHC 30.7 31.1 31.5 30.7  RDW 14.7 14.4 14.5 14.4  LYMPHSABS  --  0.6* 0.5* 0.5*  MONOABS  --  1.0 0.9 1.1*  EOSABS  --  0.0 0.0 0.1  BASOSABS  --  0.0 0.0 0.0    Recent Labs  Lab 03/12/24 0917 03/16/24 1238 03/16/24 1923 03/17/24 0338 03/18/24 0332  NA 141 140 139 138 137  K 4.2 3.8 3.7 3.5 3.5  CL 102 102 102 103 101  CO2 26 27 27 30 29   ANIONGAP  --  11 10 5 7   GLUCOSE 114* 109* 123* 91 101*  BUN 18 35* 33* 38* 48*  CREATININE 1.06 1.22 1.27* 1.58* 1.34*  AST  --   --  21 16  --   ALT  --   --  19 17  --   ALKPHOS  --   --  100 83  --   BILITOT  --   --  1.1 0.8  --   ALBUMIN  --   --  2.6* 2.3*  --   CRP  --   --   --   --  3.9*  PROCALCITON  --   --   --   --  <0.10  TSH  --   --   --  27.131*  --   BNP  --   --  1,203.9*  --  1,029.3*  MG  --   --  2.3 2.2 2.2  PHOS  --   --  4.4 4.8* 4.5  CALCIUM 8.7 9.5 8.6* 8.3* 8.7*      Recent Labs  Lab 03/12/24 0917 03/16/24 1238 03/16/24 1923 03/17/24 0338 03/18/24 0332  CRP  --   --   --   --  3.9*  PROCALCITON  --   --   --   --  <0.10  TSH  --   --   --  27.131*  --   BNP  --   --  1,203.9*  --  1,029.3*  MG  --   --  2.3 2.2 2.2  CALCIUM 8.7 9.5 8.6* 8.3* 8.7*    --------------------------------------------------------------------------------------------------------------- No results found for: "CHOL", "HDL", "LDLCALC", "LDLDIRECT", "TRIG", "CHOLHDL"  No results found for: "HGBA1C" Recent Labs    03/17/24 0338 03/17/24 0642  TSH  27.131*  --   FREET4  --  0.71   No results for input(s): "VITAMINB12", "FOLATE", "FERRITIN", "TIBC", "IRON", "RETICCTPCT" in the last 72 hours. ------------------------------------------------------------------------------------------------------------------  Cardiac Enzymes No results for input(s): "CKMB", "TROPONINI", "MYOGLOBIN" in the last 168 hours.  Invalid input(s): "CK"  Micro Results No results found for this or any previous visit (from the past 240 hours).  Radiology Report US  RENAL Result Date: 03/17/2024 CLINICAL DATA:  Acute kidney injury EXAM: RENAL / URINARY TRACT ULTRASOUND COMPLETE COMPARISON:  CT 07/11/2023 FINDINGS: Right Kidney: Renal measurements: 11.1 x 3.9 x 5.3 cm = volume: 120 mL. Echogenicity within normal limits. No mass or hydronephrosis visualized. Left Kidney: Renal measurements: 12.4 x 5.2 x 5.3 cm = volume: 179 mL. Echogenicity within normal limits. No mass or hydronephrosis visualized. Bladder: Soft tissue protruding along the inferior bladder from the region of the prostate. Is difficult to determine if this is related to the prostate or bladder wall. Other: Prostate enlargement. IMPRESSION: No hydronephrosis. Prostate enlargement. Soft tissue nodular area projecting along the inferior bladder measuring up to 2.1 cm, difficult to determine if this is related to the prostate or bladder wall. When compared to prior CT, I favor this is related to enlarged prostate. This could be further evaluated with CT with contrast with delayed images through the bladder if felt clinically indicated. Electronically Signed   By: Janeece Mechanic M.D.   On: 03/17/2024 14:37   DG Chest 2 View Result Date: 03/17/2024 CLINICAL DATA:  Dyspnea EXAM: CHEST - 2 VIEW COMPARISON:  March 16, 2024 FINDINGS: Persistent large right pleural effusion with right lower lobe atelectasis Persistent bilateral pulmonary infiltrates that could correlate with congestive changes Heart normal size IMPRESSION: No  change compared with prior Electronically Signed   By: Fredrich Jefferson M.D.   On: 03/17/2024 08:39   DG Chest 1 View Result Date: 03/16/2024 CLINICAL DATA:  History of right pleural effusion EXAM: CHEST  1 VIEW COMPARISON:  Chest radiograph dated 03/03/2024 FINDINGS: Dense right lower lung opacity. Patchy left lower lung opacity. Increased large right pleural effusion. No pneumothorax. Right heart border is obscured. No acute osseous abnormality. Right axillary surgical clips. IMPRESSION: 1. Increased large right pleural effusion with dense right lower lung opacity, likely atelectasis. 2. Patchy left lower lung opacity, likely atelectasis. Electronically Signed   By: Limin  Xu M.D.   On: 03/16/2024 11:30     Signature  -   Lynnwood Sauer M.D on 03/18/2024 at 8:15 AM   -  To page go to [email or web address]

## 2024-03-18 NOTE — TOC CM/SW Note (Signed)
 Transition of Care [facility]) - Inpatient Brief Assessment   Patient Details  Name: Samuel Porter MRN: [id number] Date of Birth: 06-29-1941  Transition of [facility]) CM/SW Contact:    Jannice Mends, LCSW Phone Number: 03/18/2024, 2:53 PM   Clinical Narrative: Patient admitted from home alone with respiratory failure. TOC following for Home Health and DME needs.     Transition of Care Asessment: Insurance and Status: Insurance coverage has been reviewed Patient has primary care physician: Yes Home environment has been reviewed: From home Prior level of function:: Independent Prior/Current Home Services: No current home services Social Drivers of Health Review: SDOH reviewed no interventions necessary Readmission risk has been reviewed: Yes Transition of care needs: transition of care needs identified, TOC will continue to follow

## 2024-03-18 NOTE — Progress Notes (Signed)
 [facility] Renal Consultation Note  Requesting MD:  Indication for Consultation:   HPI:  Samuel Porter is a 83 y.o. male.  Admitted 6/2 for SOB with new oxygen requirement 2/2 CHF exacerbation. PMHx includes BPH, glaucoma, hypertension, vitamin D  deficiency, atrial fibrillation on anticoagulation with recent cardioversion ~2 wks ago.  Last TTE 12/2023 showing LVEF 35 to 40% and severe left atrial dilation and degenerative mitral valve.    Creatinine baseline appears to be ~1.  Creatinine elevated in the setting of receiving 40 mg IV Lasix  x2 yesterday with 1.7 L output.  Per record review dry weight appears to be around 160 lb.  He is currently 171 lb.  Blood pressures have remained soft s/p midodrine 10 mg TID starting 6/3.   Doing well this morning. On 2 L O2.   Creatinine  Date/Time Value Ref Range Status  11/01/2022 09:54 AM 0.91 0.61 - 1.24 mg/dL Final   Creatinine, Ser  Date/Time Value Ref Range Status  03/18/2024 03:32 AM 1.34 (H) 0.61 - 1.24 mg/dL Final  69/62/9528 41:32 AM 1.58 (H) 0.61 - 1.24 mg/dL Final  44/10/270 53:66 PM 1.27 (H) 0.61 - 1.24 mg/dL Final  44/12/4740 59:56 PM 1.22 0.61 - 1.24 mg/dL Final  38/75/6433 29:51 AM 1.06 0.76 - 1.27 mg/dL Final  88/41/6606 30:16 AM 1.07 0.76 - 1.27 mg/dL Final  10/23/3233 57:32 AM 0.73 (L) 0.76 - 1.27 mg/dL Final  20/25/4270 62:37 AM 0.83 0.61 - 1.24 mg/dL Final  62/83/1517 61:60 AM 1.29 (H) 0.61 - 1.24 mg/dL Final  73/71/0626 94:85 PM 1.68 (H) 0.61 - 1.24 mg/dL Final  46/27/0350 09:38 AM 0.88 0.61 - 1.24 mg/dL Final  18/29/9371 69:67 AM 0.82 0.61 - 1.24 mg/dL Final  89/38/1017 51:02 AM 0.88 0.61 - 1.24 mg/dL Final     PMHx:   Past Medical History:  Diagnosis Date   Arthritis    oa   BPH (benign prostatic hyperplasia)    Cancer (HCC) 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   Cataracts, bilateral    Glaucoma    Hypertension    Vitamin D  deficiency     Past Surgical History:  Procedure Laterality  Date   CARDIOVERSION N/A 03/05/2024   Procedure: CARDIOVERSION;  Surgeon: Hazle Lites, MD;  Location: [facility];  Service: Cardiovascular;  Laterality: N/A;   CHOLECYSTECTOMY     colonscopy  2017   IR THORACENTESIS ASP PLEURAL SPACE W/IMG GUIDE  03/03/2024   JOINT REPLACEMENT Right    hip    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   Procedure: LEFT TOTAL HIP ARTHROPLASTY ANTERIOR APPROACH;  Surgeon: Liliane Rei, MD;  Location: [facility];  Service: Orthopedics;  Laterality: Left;   VENTRAL HERNIA REPAIR N/A 07/12/2023   Procedure: HERNIA REPAIR VENTRAL;  Surgeon: Stechschulte, Avon Boers, MD;  Location: [facility] OR;  Service: General;  Laterality: N/A;    Family Hx:  Family History  Problem Relation Age of Onset   Cancer - Ovarian Sister    Lymphoma Sister     Social History:  reports that he quit smoking about 55 years ago. His smoking use included cigarettes. He started smoking about 65 years ago. He has a 15 pack-year smoking history. He has never used smokeless tobacco. He reports that he does not currently use alcohol. He reports that he does not use drugs.  Allergies: No Known Allergies  Medications: Prior to Admission medications   Medication Sig Start Date End Date Taking? Authorizing Provider  acetaminophen  (TYLENOL ) 325 MG tablet Take 650 mg by mouth daily as needed for moderate pain (pain score 4-6) or mild pain (pain score 1-3).   Yes [provider]  albuterol  (VENTOLIN  HFA) 108 (90 Base) MCG/ACT inhaler Inhale 2 puffs into the lungs every 4 (four) hours as needed for shortness of breath. 11/22/23  Yes [provider]  amiodarone  (PACERONE ) 200 MG tablet Take 200 mg by mouth daily.   Yes [provider]  carvedilol  (COREG ) 3.125 MG tablet Take 1 tablet (3.125 mg total) by mouth 2 (two) times daily. 03/05/24  Yes Hilty, Aviva Lemmings, MD  cholecalciferol  (VITAMIN D ) 1000 units tablet Take 1,000 Units by mouth daily.   Yes [provider]   dapagliflozin  propanediol (FARXIGA ) 10 MG TABS tablet Take 1 tablet (10 mg total) by mouth daily before breakfast. 02/26/24  Yes Arnoldo Lapping, MD  dorzolamide -timolol  (COSOPT ) 22.3-6.8 MG/ML ophthalmic solution Place 1 drop into both eyes 2 (two) times daily. 07/09/16  Yes [provider]  ELIQUIS 5 MG TABS tablet Take 5 mg by mouth 2 (two) times daily.   Yes [provider]  finasteride  (PROSCAR ) 5 MG tablet Take 5 mg by mouth daily.   Yes [provider]  furosemide  (LASIX ) 20 MG tablet Take 1 tablet (20 mg total) by mouth daily. 02/07/24 05/07/24 Yes Cooper, Michael, MD  polyethylene glycol (MIRALAX  / GLYCOLAX ) 17 g packet Take 17 g by mouth 2 (two) times daily as needed. Patient taking differently: Take 17 g by mouth daily as needed for moderate constipation or mild constipation. 07/16/23  Yes Maczis, Michael M, PA-C  sildenafil (VIAGRA) 25 MG tablet Take 25 mg by mouth as needed for erectile dysfunction.   Yes [provider]  spironolactone  (ALDACTONE ) 25 MG tablet Take 0.5 tablets (12.5 mg total) by mouth daily. 02/26/24  Yes Arnoldo Lapping, MD  valsartan  (DIOVAN ) 40 MG tablet Take 1 tablet (40 mg total) by mouth 2 (two) times daily. 12/19/23  Yes Arnoldo Lapping, MD  amLODipine  (NORVASC ) 5 MG tablet TAKE 1 TABLET BY MOUTH DAILY for 90 Patient not taking: Reported on 03/17/2024    [provider]    I have reviewed the patient's current medications.  Labs:  Results for orders placed or performed during the hospital encounter of 03/16/24 (from the past 48 hours)  CBC     Status: None   Collection Time: 03/16/24 11:59 AM  Result Value Ref Range   WBC 8.3 4.0 - 10.5 K/uL   RBC 4.87 4.22 - 5.81 MIL/uL   Hemoglobin 14.1 13.0 - 17.0 g/dL   HCT 60.4 54.0 - 98.1 %   MCV 94.3 80.0 - 100.0 fL   MCH 29.0 26.0 - 34.0 pg   MCHC 30.7 30.0 - 36.0 g/dL   RDW 19.1 47.8 - 29.5 %   Platelets 156 150 - 400 K/uL   nRBC 0.0 0.0 - 0.2 %    Comment: Performed at  [facility], [address], [place], [place] [address]  Basic metabolic panel with GFR     Status: Abnormal   Collection Time: 03/16/24 12:38 PM  Result Value Ref Range   Sodium 140 135 - 145 mmol/L   Potassium 3.8 3.5 - 5.1 mmol/L   Chloride 102 98 - 111 mmol/L   CO2 27 22 - 32 mmol/L   Glucose, Bld 109 (H) 70 - 99 mg/dL    Comment: Glucose reference range applies only to samples taken after fasting for at least  8 hours.   BUN 35 (H) 8 - 23 mg/dL   Creatinine, Ser 2.13 0.61 - 1.24 mg/dL   Calcium 9.5 8.9 - 08.6 mg/dL   GFR, Estimated 59 (L) >60 mL/min    Comment: (NOTE) Calculated using the CKD-EPI Creatinine Equation (2021)    Anion gap 11 5 - 15    Comment: Performed at [facility], [address] [place], [place] [address]  Pro Brain natriuretic peptide     Status: Abnormal   Collection Time: 03/16/24 12:38 PM  Result Value Ref Range   Pro Brain Natriuretic Peptide 7,160.0 (H) <300.0 pg/mL    Comment: (NOTE) Age Group        Cut-Points    Interpretation  < 50 years     450 pg/mL       NT-proBNP > 450 pg/mL indicates                                ADHF is likely              50 to 75 years  900 pg/mL      NT-proBNP > 900 pg/mL indicates          ADHF is likely  > 75 years      1800 pg/mL     NT-proBNP > 1800 pg/mL indicates          ADHF is likely                           All ages    Results between       Indeterminate. Further clinical             300 and the cut-   information is needed to determine            point for age group   if ADHF is present.                                                             Elecsys proBNP II/ Elecsys proBNP II STAT           Cut-Point                       Interpretation  300 pg/mL                    NT-proBNP <300pg/mL indicates                             ADHF is not likely  Performed at [facility], [address], [place], [place] [address]   Troponin T,  High Sensitivity     Status: Abnormal   Collection Time: 03/16/24 12:38 PM  Result Value Ref Range   Troponin T High Sensitivity 22 (H) <19 ng/L    Comment: (NOTE) Biotin concentrations > 1000 ng/mL falsely decrease TnT results.  Serial cardiac troponin measurements are suggested.  Refer to the Links section for chest pain algorithms and additional  guidance. Performed at [facility], [address] [place], [place] [address]   Troponin T, High Sensitivity  Status: Abnormal   Collection Time: 03/16/24  2:08 PM  Result Value Ref Range   Troponin T High Sensitivity 23 (H) <19 ng/L    Comment: (NOTE) Biotin concentrations > 1000 ng/mL falsely decrease TnT results.  Serial cardiac troponin measurements are suggested.  Refer to the Links section for chest pain algorithms and additional  guidance. Performed at [facility], [address] [place], [place] [address]   Brain natriuretic peptide     Status: Abnormal   Collection Time: 03/16/24  7:23 PM  Result Value Ref Range   B Natriuretic Peptide 1,203.9 (H) 0.0 - 100.0 pg/mL    Comment: Performed at [facility], [address] [place], [place] [address]  CBC with Differential/Platelet     Status: Abnormal   Collection Time: 03/16/24  7:23 PM  Result Value Ref Range   WBC 7.8 4.0 - 10.5 K/uL   RBC 4.66 4.22 - 5.81 MIL/uL   Hemoglobin 13.6 13.0 - 17.0 g/dL   HCT 82.9 56.2 - 13.0 %   MCV 94.0 80.0 - 100.0 fL   MCH 29.2 26.0 - 34.0 pg   MCHC 31.1 30.0 - 36.0 g/dL   RDW 86.5 78.4 - 69.6 %   Platelets 213 150 - 400 K/uL   nRBC 0.0 0.0 - 0.2 %   Neutrophils Relative % 79 %   Neutro Abs 6.1 1.7 - 7.7 K/uL   Lymphocytes Relative 7 %   Lymphs Abs 0.6 (L) 0.7 - 4.0 K/uL   Monocytes Relative 12 %   Monocytes Absolute 1.0 0.1 - 1.0 K/uL   Eosinophils Relative 1 %   Eosinophils Absolute 0.0 0.0 - 0.5 K/uL   Basophils Relative 0 %   Basophils Absolute 0.0 0.0 - 0.1 K/uL    Immature Granulocytes 1 %   Abs Immature Granulocytes 0.06 0.00 - 0.07 K/uL    Comment: Performed at [facility], [address] [place], [place] [address]  Comprehensive metabolic panel     Status: Abnormal   Collection Time: 03/16/24  7:23 PM  Result Value Ref Range   Sodium 139 135 - 145 mmol/L   Potassium 3.7 3.5 - 5.1 mmol/L   Chloride 102 98 - 111 mmol/L   CO2 27 22 - 32 mmol/L   Glucose, Bld 123 (H) 70 - 99 mg/dL    Comment: Glucose reference range applies only to samples taken after fasting for at least 8 hours.   BUN 33 (H) 8 - 23 mg/dL   Creatinine, Ser 4.13 (H) 0.61 - 1.24 mg/dL   Calcium 8.6 (L) 8.9 - 10.3 mg/dL   Total Protein 6.6 6.5 - 8.1 g/dL   Albumin 2.6 (L) 3.5 - 5.0 g/dL   AST 21 15 - 41 U/L   ALT 19 0 - 44 U/L   Alkaline Phosphatase 100 38 - 126 U/L   Total Bilirubin 1.1 0.0 - 1.2 mg/dL   GFR, Estimated 56 (L) >60 mL/min    Comment: (NOTE) Calculated using the CKD-EPI Creatinine Equation (2021)    Anion gap 10 5 - 15    Comment: Performed at [facility], [address], [place], [place] [address]  Magnesium     Status: None   Collection Time: 03/16/24  7:23 PM  Result Value Ref Range   Magnesium 2.3 1.7 - 2.4 mg/dL    Comment: Performed at [facility], [address] [place], [place] [address]  Phosphorus  Status: None   Collection Time: 03/16/24  7:23 PM  Result Value Ref Range   Phosphorus 4.4 2.5 - 4.6 mg/dL    Comment: Performed at [facility], [address] [place], [place] [address]  TSH     Status: Abnormal   Collection Time: 03/17/24  3:38 AM  Result Value Ref Range   TSH 27.131 (H) 0.350 - 4.500 uIU/mL    Comment: Performed by a 3rd Generation assay with a functional sensitivity of <=0.01 uIU/mL. Performed at [facility], [address] [place], [place] [address]   Heparin level (unfractionated)     Status: Abnormal   Collection Time: 03/17/24  3:38 AM  Result Value Ref Range   Heparin  Unfractionated >1.10 (H) 0.30 - 0.70 IU/mL    Comment: (NOTE) The clinical reportable range upper limit is being lowered to >1.10 to align with the FDA approved guidance for the current laboratory assay.  If heparin results are below expected values, and patient dosage has  been confirmed, suggest follow up testing of antithrombin III levels. Performed at [facility], [address], [place], [place] [address]   APTT     Status: Abnormal   Collection Time: 03/17/24  3:38 AM  Result Value Ref Range   aPTT 63 (H) 24 - 36 seconds    Comment:        IF BASELINE aPTT IS ELEVATED, SUGGEST PATIENT RISK ASSESSMENT BE USED TO DETERMINE APPROPRIATE ANTICOAGULANT THERAPY. Performed at [facility], [address], [place], [place] [address]   CBC with Differential/Platelet     Status: Abnormal   Collection Time: 03/17/24  3:38 AM  Result Value Ref Range   WBC 6.2 4.0 - 10.5 K/uL   RBC 4.26 4.22 - 5.81 MIL/uL   Hemoglobin 12.5 (L) 13.0 - 17.0 g/dL   HCT 27.2 53.6 - 64.4 %   MCV 93.2 80.0 - 100.0 fL   MCH 29.3 26.0 - 34.0 pg   MCHC 31.5 30.0 - 36.0 g/dL   RDW 03.4 74.2 - 59.5 %   Platelets 178 150 - 400 K/uL   nRBC 0.0 0.0 - 0.2 %   Neutrophils Relative % 76 %   Neutro Abs 4.8 1.7 - 7.7 K/uL   Lymphocytes Relative 8 %   Lymphs Abs 0.5 (L) 0.7 - 4.0 K/uL   Monocytes Relative 14 %   Monocytes Absolute 0.9 0.1 - 1.0 K/uL   Eosinophils Relative 1 %   Eosinophils Absolute 0.0 0.0 - 0.5 K/uL   Basophils Relative 0 %   Basophils Absolute 0.0 0.0 - 0.1 K/uL   Immature Granulocytes 1 %   Abs Immature Granulocytes 0.03 0.00 - 0.07 K/uL    Comment: Performed at [facility], [address], [place], [place] [address]  Comprehensive metabolic panel with GFR     Status: Abnormal   Collection Time: 03/17/24  3:38 AM  Result Value Ref Range   Sodium 138 135 - 145 mmol/L   Potassium 3.5 3.5 - 5.1 mmol/L   Chloride 103 98 - 111 mmol/L   CO2 30 22 - 32 mmol/L   Glucose, Bld  91 70 - 99 mg/dL    Comment: Glucose reference range applies only to samples taken after fasting for at least 8 hours.   BUN 38 (H) 8 - 23 mg/dL   Creatinine, Ser 6.43 (H) 0.61 - 1.24 mg/dL   Calcium 8.3 (L) 8.9 - 10.3 mg/dL  Total Protein 5.9 (L) 6.5 - 8.1 g/dL   Albumin 2.3 (L) 3.5 - 5.0 g/dL   AST 16 15 - 41 U/L   ALT 17 0 - 44 U/L   Alkaline Phosphatase 83 38 - 126 U/L   Total Bilirubin 0.8 0.0 - 1.2 mg/dL   GFR, Estimated 43 (L) >60 mL/min    Comment: (NOTE) Calculated using the CKD-EPI Creatinine Equation (2021)    Anion gap 5 5 - 15    Comment: Performed at [facility], [address] [place], [place] [address]  Phosphorus     Status: Abnormal   Collection Time: 03/17/24  3:38 AM  Result Value Ref Range   Phosphorus 4.8 (H) 2.5 - 4.6 mg/dL    Comment: Performed at [facility], [address], [place], [place] [address]  Magnesium     Status: None   Collection Time: 03/17/24  3:38 AM  Result Value Ref Range   Magnesium 2.2 1.7 - 2.4 mg/dL    Comment: Performed at [facility], [address], [place], [place] [address]  Lactate dehydrogenase     Status: None   Collection Time: 03/17/24  6:42 AM  Result Value Ref Range   LDH 110 98 - 192 U/L    Comment: Performed at [facility], [address], [place], [place] [address]  Uric acid     Status: None   Collection Time: 03/17/24  6:42 AM  Result Value Ref Range   Uric Acid, Serum 5.5 3.7 - 8.6 mg/dL    Comment: Performed at [facility], [address], [place], [place] [address]  Osmolality     Status: Abnormal   Collection Time: 03/17/24  6:42 AM  Result Value Ref Range   Osmolality 304 (H) 275 - 295 mOsm/kg    Comment: REPEATED TO VERIFY Performed at [facility], [address] [place], [place] [address]   T4, free     Status: None   Collection Time: 03/17/24  6:42 AM  Result Value Ref Range   Free T4 0.71 0.61 - 1.12 ng/dL    Comment: (NOTE) Biotin ingestion may  interfere with free T4 tests. If the results are inconsistent with the TSH level, previous test results, or the clinical presentation, then consider biotin interference. If needed, order repeat testing after stopping biotin. Performed at [facility], [address], [place], [place] [address]   Cortisol     Status: None   Collection Time: 03/17/24  6:42 AM  Result Value Ref Range   Cortisol, Plasma 14.7 ug/dL    Comment: (NOTE) AM    6.7 - 22.6 ug/dL PM   <29.5       ug/dL Performed at [facility], [address] [place], [place] [address]   Heparin level (unfractionated)     Status: Abnormal   Collection Time: 03/18/24  3:32 AM  Result Value Ref Range   Heparin Unfractionated >1.10 (H) 0.30 - 0.70 IU/mL    Comment: (NOTE) The clinical reportable range upper limit is being lowered to >1.10 to align with the FDA approved guidance for the current laboratory assay.  If heparin results are below expected values, and patient dosage has  been confirmed, suggest follow up testing of antithrombin III levels. Performed at [facility], [address] [place], [place] [address]   APTT     Status: Abnormal   Collection Time: 03/18/24  3:32 AM  Result Value Ref Range   aPTT 106 (H) 24 - 36 seconds    Comment:        IF BASELINE aPTT IS ELEVATED, SUGGEST PATIENT RISK ASSESSMENT BE USED TO DETERMINE APPROPRIATE ANTICOAGULANT THERAPY. Performed at [facility], [address], [place], [place] [address]   Procalcitonin     Status: None   Collection Time: 03/18/24  3:32 AM  Result Value Ref Range   Procalcitonin <0.10 ng/mL    Comment:        Interpretation: PCT (Procalcitonin) <= 0.5 ng/mL: Systemic infection (sepsis) is not likely. Local bacterial infection is possible. (NOTE)       Sepsis PCT Algorithm           Lower Respiratory Tract                                      Infection PCT Algorithm    ----------------------------      ----------------------------         PCT < 0.25 ng/mL                PCT < 0.10 ng/mL          Strongly encourage             Strongly discourage   discontinuation of antibiotics    initiation of antibiotics    ----------------------------     -----------------------------       PCT 0.25 - 0.50 ng/mL            PCT 0.10 - 0.25 ng/mL               OR       >80% decrease in PCT            Discourage initiation of                                            antibiotics      Encourage discontinuation           of antibiotics    ----------------------------     -----------------------------         PCT >= 0.50 ng/mL              PCT 0.26 - 0.50 ng/mL               AND        <80% decrease in PCT             Encourage initiation of                                             antibiotics       Encourage continuation           of antibiotics    ----------------------------     -----------------------------        PCT >= 0.50 ng/mL                  PCT > 0.50 ng/mL               AND         increase in  PCT                  Strongly encourage                                      initiation of antibiotics    Strongly encourage escalation           of antibiotics                                     -----------------------------                                           PCT <= 0.25 ng/mL                                                 OR                                        > 80% decrease in PCT                                      Discontinue / Do not initiate                                             antibiotics  Performed at [facility], [address] [place], [place] [address]   Phosphorus     Status: None   Collection Time: 03/18/24  3:32 AM  Result Value Ref Range   Phosphorus 4.5 2.5 - 4.6 mg/dL    Comment: Performed at [facility], [address], [place], [place] [address]  Magnesium     Status: None   Collection Time: 03/18/24  3:32 AM  Result Value Ref  Range   Magnesium 2.2 1.7 - 2.4 mg/dL    Comment: Performed at [facility], [address] [place], [place] [address]  CBC with Differential/Platelet     Status: Abnormal   Collection Time: 03/18/24  3:32 AM  Result Value Ref Range   WBC 6.4 4.0 - 10.5 K/uL   RBC 4.21 (L) 4.22 - 5.81 MIL/uL   Hemoglobin 12.2 (L) 13.0 - 17.0 g/dL   HCT 91.4 78.2 - 95.6 %   MCV 94.3 80.0 - 100.0 fL   MCH 29.0 26.0 - 34.0 pg   MCHC 30.7 30.0 - 36.0 g/dL   RDW 21.3 08.6 - 57.8 %   Platelets 174 150 - 400 K/uL   nRBC 0.0 0.0 - 0.2 %   Neutrophils Relative % 73 %   Neutro Abs 4.7 1.7 - 7.7 K/uL   Lymphocytes Relative 8 %   Lymphs Abs 0.5 (L) 0.7 - 4.0 K/uL   Monocytes Relative 17 %   Monocytes Absolute 1.1 (H) 0.1 -  1.0 K/uL   Eosinophils Relative 1 %   Eosinophils Absolute 0.1 0.0 - 0.5 K/uL   Basophils Relative 0 %   Basophils Absolute 0.0 0.0 - 0.1 K/uL   Immature Granulocytes 1 %   Abs Immature Granulocytes 0.05 0.00 - 0.07 K/uL    Comment: Performed at [facility], [address] [place], [place] [address]  Brain natriuretic peptide     Status: Abnormal   Collection Time: 03/18/24  3:32 AM  Result Value Ref Range   B Natriuretic Peptide 1,029.3 (H) 0.0 - 100.0 pg/mL    Comment: Performed at [facility], [address] [place], [place] [address]  C-reactive protein     Status: Abnormal   Collection Time: 03/18/24  3:32 AM  Result Value Ref Range   CRP 3.9 (H) <1.0 mg/dL    Comment: Performed at [facility], [address], [place], [place] [address]  Basic metabolic panel     Status: Abnormal   Collection Time: 03/18/24  3:32 AM  Result Value Ref Range   Sodium 137 135 - 145 mmol/L   Potassium 3.5 3.5 - 5.1 mmol/L   Chloride 101 98 - 111 mmol/L   CO2 29 22 - 32 mmol/L   Glucose, Bld 101 (H) 70 - 99 mg/dL    Comment: Glucose reference range applies only to samples taken after fasting for at least 8 hours.   BUN 48 (H) 8 - 23 mg/dL   Creatinine, Ser 1.32 (H)  0.61 - 1.24 mg/dL   Calcium 8.7 (L) 8.9 - 10.3 mg/dL   GFR, Estimated 53 (L) >60 mL/min    Comment: (NOTE) Calculated using the CKD-EPI Creatinine Equation (2021)    Anion gap 7 5 - 15    Comment: Performed at [facility], [address], [place], [place] [address]     ROS:  Pertinent items are noted in HPI.  Physical Exam: Vitals:   03/18/24 0000 03/18/24 0400  BP: 95/67 (!) 92/57  Pulse: 70 78  Resp: 14 13  Temp: 98 F (36.7 C)   SpO2: 99% 98%     General: chronically ill-appearing, NAD Heart: irregularly irregular rhythm, no murmurs on exam  Lungs: Decreased breath sounds on the right side, crackles in left lung base, no increased work of breathing  Abdomen: Soft non-tender Extremities: +1 peripheral edema   Assessment/Plan: AKI: Cr improving with holding diuresis yesterday. 40 mg dose of IV Lasix  held in the setting of SBP <90 per RN note. He is scheduled by primary team to receive dose of albumin and Lasix  40 mg IV this morning. He is currently down 1.7 L this admission. Will continue to trend daily BMP and will monitor Cr with additional diuresis.  Hypoalbuminemia: s/p albumin x2. Could consider additional work up with INR and RUQ US .  Hypotension: In the setting of diuresis and 3rd spacing fluid. Continue Midodrine 10 mg TID and trend BP. S/p Albumin yesterday and will receive additional dose this morning. Coreg  3.125 mg BID held yesterday.  Right Pleural effusion: No increased WOB and was on RA on my exam sating 95%; further management per PCCM.  CHF exacerbation: Management per Cardiology. Daily weight pending.  Continue strict I's and O's.  Replete electrolytes with goal potassium >4, Mag > 2. A-fib: Per primary, not currently in RVR.  Will need appropriate rate and rhythm control to optimize cardiac output. Coreg  held due to soft BP. Rate currently low 100s.  BPH: consider restarting home finasteride . Renal ultrasound showing enlarged prostate and new nodular  area projecting into the inferior bladder. Per radiologist could consider CT w/ contrast to further eval. Will not order d/t AKI. Consider workup once AKI resolves    Clem Currier 03/18/2024, 8:12 AM

## 2024-03-18 NOTE — Interval H&P Note (Signed)
 History and Physical Interval Note:  03/18/2024 10:29 AM  Samuel Porter  has presented today for surgery, with the diagnosis of recurrent R pleural effusion.  The various methods of treatment have been discussed with the patient and family. After consideration of risks, benefits and other options for treatment, the patient has consented to  Procedure(s): CHEST TUBE INSERTION (Right) as a surgical intervention.  The patient's history has been reviewed, patient examined, no change in status, stable for surgery.  I have reviewed the patient's chart and labs.  Questions were answered to the patient's satisfaction.     Archer Kobs Marcina Kinnison

## 2024-03-18 NOTE — Progress Notes (Signed)
 Physical Therapy Treatment [name] MRN: [id number] DOB: October 25, 1940 Today's Date: 03/18/2024   History of Present Illness 83 year old man presented 6/2 from pulmonary clinic with worsening dyspnea exertion, hypoxemia, lower extremity swelling, and A-fib with RVR concerning for CHF exacerbation with recurrent right greater than left pleural effusion, and AKI.  PMHx includes BPH, glaucoma, hypertension, vitamin D  deficiency, atrial fibrillation on anticoagulation with recent cardioversion ~2 wks ago.    PT Comments  Patient resting in bed and requesting assist to sit up for use of urinal. Assist provided for lines and pt completed supine>sit with use of bed features at mod ind level. PT now with CT and on continuous suction, limited activity to exercise in room at EOB. Pt completed repeated sit<>stand for functional strengthening. Pt unable to stand without use of UE's and unable to control decent to sit to low height. EOB elevated to encourage LE use. Pt powered up with single UE and bil hands placed on knees for slow eccentric lowering to sit. Reviewed ankle pumps for circulation and EOS pt returned to supine and adjusted to partial chair position in bed. Alarm on and call bell within reach. Will continue to progress pt as able during acute stay.    If plan is discharge home, recommend the following: A little help with walking and/or transfers;A little help with bathing/dressing/bathroom;Assistance with cooking/housework;Assist for transportation   Can travel by private vehicle        Equipment Recommendations  None recommended by PT    Recommendations for Other Services       Precautions / Restrictions Precautions Precautions: Fall Recall of Precautions/Restrictions: Intact Restrictions Weight Bearing Restrictions Per Provider Order: No     Mobility  Bed Mobility Overal bed mobility: Modified Independent             General bed mobility comments:  extra time for supine<>sit. assist for lines    Transfers Overall transfer level: Needs assistance Equipment used: Rolling walker (2 wheels) Transfers: Sit to/from Stand Sit to Stand: Supervision           General transfer comment: Supervision for safety, slow to rise but stable with RW for support upon standing.    Ambulation/Gait               General Gait Details: on continuous suction, limited by CT   Stairs             Wheelchair Mobility     Tilt Bed    Modified Rankin (Stroke Patients Only)       Balance Overall balance assessment: Mild deficits observed, not formally tested                                          Communication Communication Communication: No apparent difficulties  Cognition Arousal: Alert Behavior During Therapy: WFL for tasks assessed/performed   PT - Cognitive impairments: No apparent impairments                         Following commands: Intact      Cueing Cueing Techniques: Verbal cues  Exercises Other Exercises Other Exercises: 10x sit<>stand with UE use to power up and hands on knees to facilitate slow controlled eccentric lowering.    General Comments        Pertinent Vitals/Pain Pain Assessment Pain Assessment: Faces Faces Pain  Scale: Hurts a little bit Pain Location: CT site Pain Descriptors / Indicators: Discomfort Pain Intervention(s): Limited activity within patient's tolerance, Monitored during session, Repositioned    Home Living                          Prior Function            PT Goals (current goals can now be found in the care plan section) Acute Rehab PT Goals Patient Stated Goal: Go home PT Goal Formulation: With patient Time For Goal Achievement: 03/31/24 Potential to Achieve Goals: Good Progress towards PT goals: Progressing toward goals    Frequency    Min 2X/week      PT Plan      Co-evaluation              AM-PAC PT  "6 Clicks" Mobility   Outcome Measure  Help needed turning from your back to your side while in a flat bed without using bedrails?: None Help needed moving from lying on your back to sitting on the side of a flat bed without using bedrails?: None Help needed moving to and from a bed to a chair (including a wheelchair)?: A Little Help needed standing up from a chair using your arms (e.g., wheelchair or bedside chair)?: A Little Help needed to walk in hospital room?: A Little Help needed climbing 3-5 steps with a railing? : A Little 6 Click Score: 20    End of Session Equipment Utilized During Treatment: Gait belt Activity Tolerance: Patient tolerated treatment well Patient left: in bed;with call bell/phone within reach;with bed alarm set;with family/visitor present Nurse Communication: Mobility status PT Visit Diagnosis: Other abnormalities of gait and mobility (R26.89);Muscle weakness (generalized) (M62.81)     Time: 0454-0981 PT Time Calculation (min) (ACUTE ONLY): 22 min  Charges:    $Therapeutic Activity: 8-22 mins PT General Charges $$ ACUTE PT VISIT: 1 Visit                     Tish Forge, DPT Acute Rehabilitation Services Office [phone]  03/18/24 4:47 PM

## 2024-03-18 NOTE — Progress Notes (Signed)
 PT Cancellation Note  Patient Details Name: SCOTTY WEIGELT MRN: [id number] DOB: 06-24-1941   Cancelled Treatment:    Reason Eval/Treat Not Completed: Patient at procedure or test/unavailable (off unit for CT placement. will follow up at later date/time as shceudle allows and pt able.)   Corbin Dess PT, DPT Acute Rehabilitation Services Office [phone]  03/18/24 1:03 PM

## 2024-03-19 ENCOUNTER — Other Ambulatory Visit

## 2024-03-19 ENCOUNTER — Inpatient Hospital Stay (HOSPITAL_COMMUNITY)

## 2024-03-19 DIAGNOSIS — I4819 Other persistent atrial fibrillation: Secondary | ICD-10-CM | POA: Diagnosis not present

## 2024-03-19 DIAGNOSIS — E8809 Other disorders of plasma-protein metabolism, not elsewhere classified: Secondary | ICD-10-CM | POA: Diagnosis not present

## 2024-03-19 DIAGNOSIS — I5043 Acute on chronic combined systolic (congestive) and diastolic (congestive) heart failure: Secondary | ICD-10-CM | POA: Diagnosis not present

## 2024-03-19 DIAGNOSIS — J90 Pleural effusion, not elsewhere classified: Secondary | ICD-10-CM | POA: Diagnosis not present

## 2024-03-19 DIAGNOSIS — J9601 Acute respiratory failure with hypoxia: Secondary | ICD-10-CM | POA: Diagnosis not present

## 2024-03-19 LAB — BASIC METABOLIC PANEL WITH GFR
Anion gap: 6 (ref 5–15)
BUN: 42 mg/dL — ABNORMAL HIGH (ref 8–23)
CO2: 28 mmol/L (ref 22–32)
Calcium: 8.5 mg/dL — ABNORMAL LOW (ref 8.9–10.3)
Chloride: 101 mmol/L (ref 98–111)
Creatinine, Ser: 1.34 mg/dL — ABNORMAL HIGH (ref 0.61–1.24)
GFR, Estimated: 53 mL/min — ABNORMAL LOW (ref >60)
Glucose, Bld: 112 mg/dL — ABNORMAL HIGH (ref 70–99)
Potassium: 3.4 mmol/L — ABNORMAL LOW (ref 3.5–5.1)
Sodium: 135 mmol/L (ref 135–145)

## 2024-03-19 LAB — CBC WITH DIFFERENTIAL/PLATELET
Abs Immature Granulocytes: 0.04 10*3/uL (ref 0.00–0.07)
Basophils Absolute: 0 10*3/uL (ref 0.0–0.1)
Basophils Relative: 0 %
Eosinophils Absolute: 0.1 10*3/uL (ref 0.0–0.5)
Eosinophils Relative: 1 %
HCT: 40.3 % (ref 39.0–52.0)
Hemoglobin: 12.4 g/dL — ABNORMAL LOW (ref 13.0–17.0)
Immature Granulocytes: 1 %
Lymphocytes Relative: 7 %
Lymphs Abs: 0.5 10*3/uL — ABNORMAL LOW (ref 0.7–4.0)
MCH: 29.4 pg (ref 26.0–34.0)
MCHC: 30.8 g/dL (ref 30.0–36.0)
MCV: 95.5 fL (ref 80.0–100.0)
Monocytes Absolute: 1 10*3/uL (ref 0.1–1.0)
Monocytes Relative: 15 %
Neutro Abs: 5.1 10*3/uL (ref 1.7–7.7)
Neutrophils Relative %: 76 %
Platelets: 165 10*3/uL (ref 150–400)
RBC: 4.22 MIL/uL (ref 4.22–5.81)
RDW: 14.3 % (ref 11.5–15.5)
WBC: 6.7 10*3/uL (ref 4.0–10.5)
nRBC: 0 % (ref 0.0–0.2)

## 2024-03-19 LAB — BRAIN NATRIURETIC PEPTIDE: B Natriuretic Peptide: 1086.3 pg/mL — ABNORMAL HIGH (ref 0.0–100.0)

## 2024-03-19 LAB — CYTOLOGY - NON PAP

## 2024-03-19 LAB — HEPARIN LEVEL (UNFRACTIONATED): Heparin Unfractionated: 0.78 [IU]/mL — ABNORMAL HIGH (ref 0.30–0.70)

## 2024-03-19 LAB — PHOSPHORUS: Phosphorus: 3.3 mg/dL (ref 2.5–4.6)

## 2024-03-19 LAB — PROCALCITONIN: Procalcitonin: <0.1 ng/mL

## 2024-03-19 LAB — C-REACTIVE PROTEIN: CRP: 3.5 mg/dL — ABNORMAL HIGH (ref <1.0)

## 2024-03-19 LAB — APTT: aPTT: 72 s — ABNORMAL HIGH (ref 24–36)

## 2024-03-19 LAB — MAGNESIUM: Magnesium: 2.1 mg/dL (ref 1.7–2.4)

## 2024-03-19 MED ORDER — SODIUM CHLORIDE 0.9 % IV BOLUS
250.0000 mL | Freq: Once | INTRAVENOUS | Status: AC
  Administered 2024-03-19: 250 mL via INTRAVENOUS

## 2024-03-19 MED ORDER — FUROSEMIDE 10 MG/ML IJ SOLN
40.0000 mg | Freq: Once | INTRAMUSCULAR | Status: AC
  Administered 2024-03-19: 40 mg via INTRAVENOUS
  Filled 2024-03-19: qty 4

## 2024-03-19 MED ORDER — ALBUMIN HUMAN 25 % IV SOLN
12.5000 g | Freq: Once | INTRAVENOUS | Status: AC
  Administered 2024-03-19: 12.5 g via INTRAVENOUS
  Filled 2024-03-19: qty 50

## 2024-03-19 MED ORDER — APIXABAN 5 MG PO TABS
5.0000 mg | ORAL_TABLET | Freq: Two times a day (BID) | ORAL | Status: DC
  Administered 2024-03-19 – 2024-03-22 (×7): 5 mg via ORAL
  Filled 2024-03-19 (×7): qty 1

## 2024-03-19 MED ORDER — POTASSIUM CHLORIDE CRYS ER 20 MEQ PO TBCR
40.0000 meq | EXTENDED_RELEASE_TABLET | Freq: Once | ORAL | Status: AC
  Administered 2024-03-19: 40 meq via ORAL
  Filled 2024-03-19: qty 2

## 2024-03-19 NOTE — Progress Notes (Signed)
 Pt BP 89/61 with MAP of 68. Pt asymptomatic, no change in mental status, other VS stable.  Pt had last dose of Toprol  XL 24tab at 1935 yesterday, he is on Amiodarone  drip. Mansy, MD notified.  Ne order for 250 NS bolus placed.

## 2024-03-19 NOTE — Progress Notes (Signed)
 [facility] Renal Consultation Note  Requesting MD:  Indication for Consultation:   HPI:  Samuel Porter is a 83 y.o. male.  Admitted 6/2 for SOB with new oxygen requirement 2/2 CHF exacerbation. PMHx includes BPH, glaucoma, hypertension, vitamin D  deficiency, atrial fibrillation on anticoagulation with recent cardioversion ~2 wks ago.  Last TTE 12/2023 showing LVEF 35 to 40% and severe left atrial dilation and degenerative mitral valve.    Creatinine baseline ~1.  Creatinine elevated in the setting of diuresis. Diuresed 1.6 L yesterday with 40 mg IV Lasix .  Per record review dry weight ~160 lb.  Currently 166 lb.  Blood pressure improving on midodrine 10 mg TID.   Creatinine  Date/Time Value Ref Range Status  11/01/2022 09:54 AM 0.91 0.61 - 1.24 mg/dL Final   Creatinine, Ser  Date/Time Value Ref Range Status  03/19/2024 04:27 AM 1.34 (H) 0.61 - 1.24 mg/dL Final  16/07/9603 54:09 AM 1.34 (H) 0.61 - 1.24 mg/dL Final  81/19/1478 29:56 AM 1.58 (H) 0.61 - 1.24 mg/dL Final  21/30/8657 84:69 PM 1.27 (H) 0.61 - 1.24 mg/dL Final  62/95/2841 32:44 PM 1.22 0.61 - 1.24 mg/dL Final  10/17/7251 66:44 AM 1.06 0.76 - 1.27 mg/dL Final  03/47/4259 56:38 AM 1.07 0.76 - 1.27 mg/dL Final  75/64/3329 51:88 AM 0.73 (L) 0.76 - 1.27 mg/dL Final  41/66/0630 16:01 AM 0.83 0.61 - 1.24 mg/dL Final  09/32/3557 32:20 AM 1.29 (H) 0.61 - 1.24 mg/dL Final  25/42/7062 37:62 PM 1.68 (H) 0.61 - 1.24 mg/dL Final  83/15/1761 60:73 AM 0.88 0.61 - 1.24 mg/dL Final  71/03/2693 85:46 AM 0.82 0.61 - 1.24 mg/dL Final  27/12/5007 38:18 AM 0.88 0.61 - 1.24 mg/dL Final     PMHx:   Past Medical History:  Diagnosis Date   Arthritis    oa   BPH (benign prostatic hyperplasia)    Cancer (HCC) 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   Cataracts, bilateral    Glaucoma    Hypertension    Vitamin D  deficiency     Past Surgical History:  Procedure Laterality Date   CARDIOVERSION N/A 03/05/2024   Procedure:  CARDIOVERSION;  Surgeon: Hazle Lites, MD;  Location: [facility] IN[facility];  Service: Cardiovascular;  Laterality: N/A;   CHOLECYSTECTOMY     colonscopy  2017   IR THORACENTESIS ASP PLEURAL SPACE W/IMG GUIDE  03/03/2024   JOINT REPLACEMENT Right    hip    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   Procedure: LEFT TOTAL HIP ARTHROPLASTY ANTERIOR APPROACH;  Surgeon: Liliane Rei, MD;  Location: [facility] ORS;  Service: Orthopedics;  Laterality: Left;   VENTRAL HERNIA REPAIR N/A 07/12/2023   Procedure: HERNIA REPAIR VENTRAL;  Surgeon: Stechschulte, Avon Boers, MD;  Location: [facility] OR;  Service: General;  Laterality: N/A;    Family Hx:  Family History  Problem Relation Age of Onset   Cancer - Ovarian Sister    Lymphoma Sister     Social History:  reports that he quit smoking about 55 years ago. His smoking use included cigarettes. He started smoking about 65 years ago. He has a 15 pack-year smoking history. He has never used smokeless tobacco. He reports that he does not currently use alcohol. He reports that he does not use drugs.  Allergies: No Known Allergies  Medications: Prior to Admission medications   Medication Sig Start Date End Date Taking? Authorizing Provider  acetaminophen  (TYLENOL ) 325 MG tablet Take 650 mg by mouth daily as needed  for moderate pain (pain score 4-6) or mild pain (pain score 1-3).   Yes [provider]  albuterol  (VENTOLIN  HFA) 108 (90 Base) MCG/ACT inhaler Inhale 2 puffs into the lungs every 4 (four) hours as needed for shortness of breath. 11/22/23  Yes [provider]  amiodarone  (PACERONE ) 200 MG tablet Take 200 mg by mouth daily.   Yes [provider]  carvedilol  (COREG ) 3.125 MG tablet Take 1 tablet (3.125 mg total) by mouth 2 (two) times daily. 03/05/24  Yes Hilty, Aviva Lemmings, MD  cholecalciferol  (VITAMIN D ) 1000 units tablet Take 1,000 Units by mouth daily.   Yes [provider]  dapagliflozin  propanediol (FARXIGA ) 10 MG TABS tablet  Take 1 tablet (10 mg total) by mouth daily before breakfast. 02/26/24  Yes Arnoldo Lapping, MD  dorzolamide -timolol  (COSOPT ) 22.3-6.8 MG/ML ophthalmic solution Place 1 drop into both eyes 2 (two) times daily. 07/09/16  Yes [provider]  ELIQUIS 5 MG TABS tablet Take 5 mg by mouth 2 (two) times daily.   Yes [provider]  finasteride  (PROSCAR ) 5 MG tablet Take 5 mg by mouth daily.   Yes [provider]  furosemide  (LASIX ) 20 MG tablet Take 1 tablet (20 mg total) by mouth daily. 02/07/24 05/07/24 Yes Cooper, Michael, MD  polyethylene glycol (MIRALAX  / GLYCOLAX ) 17 g packet Take 17 g by mouth 2 (two) times daily as needed. Patient taking differently: Take 17 g by mouth daily as needed for moderate constipation or mild constipation. 07/16/23  Yes Maczis, Michael M, PA-C  sildenafil (VIAGRA) 25 MG tablet Take 25 mg by mouth as needed for erectile dysfunction.   Yes [provider]  spironolactone  (ALDACTONE ) 25 MG tablet Take 0.5 tablets (12.5 mg total) by mouth daily. 02/26/24  Yes Arnoldo Lapping, MD  valsartan  (DIOVAN ) 40 MG tablet Take 1 tablet (40 mg total) by mouth 2 (two) times daily. 12/19/23  Yes Arnoldo Lapping, MD  amLODipine  (NORVASC ) 5 MG tablet TAKE 1 TABLET BY MOUTH DAILY for 90 Patient not taking: Reported on 03/17/2024    [provider]    I have reviewed the patient's current medications.  Labs:  Results for orders placed or performed during the hospital encounter of 03/16/24 (from the past 48 hours)  Heparin level (unfractionated)     Status: Abnormal   Collection Time: 03/18/24  3:32 AM  Result Value Ref Range   Heparin Unfractionated >1.10 (H) 0.30 - 0.70 IU/mL    Comment: (NOTE) The clinical reportable range upper limit is being lowered to >1.10 to align with the FDA approved guidance for the current laboratory assay.  If heparin results are below expected values, and patient dosage has  been confirmed, suggest follow up testing of  antithrombin III levels. Performed at [facility], [address], [place], [place] [address]   APTT     Status: Abnormal   Collection Time: 03/18/24  3:32 AM  Result Value Ref Range   aPTT 106 (H) 24 - 36 seconds    Comment:        IF BASELINE aPTT IS ELEVATED, SUGGEST PATIENT RISK ASSESSMENT BE USED TO DETERMINE APPROPRIATE ANTICOAGULANT THERAPY. Performed at [facility], [address], [place], [place] [address]   Procalcitonin     Status: None   Collection Time: 03/18/24  3:32 AM  Result Value Ref Range   Procalcitonin <0.10 ng/mL    Comment:        Interpretation: PCT (Procalcitonin) <= 0.5 ng/mL:  Systemic infection (sepsis) is not likely. Local bacterial infection is possible. (NOTE)       Sepsis PCT Algorithm           Lower Respiratory Tract                                      Infection PCT Algorithm    ----------------------------     ----------------------------         PCT < 0.25 ng/mL                PCT < 0.10 ng/mL          Strongly encourage             Strongly discourage   discontinuation of antibiotics    initiation of antibiotics    ----------------------------     -----------------------------       PCT 0.25 - 0.50 ng/mL            PCT 0.10 - 0.25 ng/mL               OR       >80% decrease in PCT            Discourage initiation of                                            antibiotics      Encourage discontinuation           of antibiotics    ----------------------------     -----------------------------         PCT >= 0.50 ng/mL              PCT 0.26 - 0.50 ng/mL               AND        <80% decrease in PCT             Encourage initiation of                                             antibiotics       Encourage continuation           of antibiotics    ----------------------------     -----------------------------        PCT >= 0.50 ng/mL                  PCT > 0.50 ng/mL               AND         increase in PCT                   Strongly encourage                                      initiation of antibiotics    Strongly encourage escalation           of antibiotics                                     -----------------------------  PCT <= 0.25 ng/mL                                                 OR                                        > 80% decrease in PCT                                      Discontinue / Do not initiate                                             antibiotics  Performed at [facility], [address] [place], [place] [address]   Phosphorus     Status: None   Collection Time: 03/18/24  3:32 AM  Result Value Ref Range   Phosphorus 4.5 2.5 - 4.6 mg/dL    Comment: Performed at [facility], [address], [place], [place] [address]  Magnesium     Status: None   Collection Time: 03/18/24  3:32 AM  Result Value Ref Range   Magnesium 2.2 1.7 - 2.4 mg/dL    Comment: Performed at [facility], [address] [place], [place] [address]  CBC with Differential/Platelet     Status: Abnormal   Collection Time: 03/18/24  3:32 AM  Result Value Ref Range   WBC 6.4 4.0 - 10.5 K/uL   RBC 4.21 (L) 4.22 - 5.81 MIL/uL   Hemoglobin 12.2 (L) 13.0 - 17.0 g/dL   HCT 91.4 78.2 - 95.6 %   MCV 94.3 80.0 - 100.0 fL   MCH 29.0 26.0 - 34.0 pg   MCHC 30.7 30.0 - 36.0 g/dL   RDW 21.3 08.6 - 57.8 %   Platelets 174 150 - 400 K/uL   nRBC 0.0 0.0 - 0.2 %   Neutrophils Relative % 73 %   Neutro Abs 4.7 1.7 - 7.7 K/uL   Lymphocytes Relative 8 %   Lymphs Abs 0.5 (L) 0.7 - 4.0 K/uL   Monocytes Relative 17 %   Monocytes Absolute 1.1 (H) 0.1 - 1.0 K/uL   Eosinophils Relative 1 %   Eosinophils Absolute 0.1 0.0 - 0.5 K/uL   Basophils Relative 0 %   Basophils Absolute 0.0 0.0 - 0.1 K/uL   Immature Granulocytes 1 %   Abs Immature Granulocytes 0.05 0.00 - 0.07 K/uL    Comment: Performed at [facility], [address], [place], [place] [address]   Brain natriuretic peptide     Status: Abnormal   Collection Time: 03/18/24  3:32 AM  Result Value Ref Range   B Natriuretic Peptide 1,029.3 (H) 0.0 - 100.0 pg/mL    Comment: Performed at [facility], [address] [place], [place] [address]  C-reactive protein     Status: Abnormal   Collection Time: 03/18/24  3:32 AM  Result Value Ref Range   CRP 3.9 (H) <1.0 mg/dL    Comment: Performed at [facility], [address] [place], [place] [address]  Basic metabolic panel     Status: Abnormal   Collection Time: 03/18/24  3:32 AM  Result Value Ref Range   Sodium 137 135 - 145 mmol/L   Potassium 3.5 3.5 - 5.1 mmol/L   Chloride 101 98 - 111 mmol/L   CO2 29 22 - 32 mmol/L   Glucose, Bld 101 (H) 70 - 99 mg/dL    Comment: Glucose reference range applies only to samples taken after fasting for at least 8 hours.   BUN 48 (H) 8 - 23 mg/dL   Creatinine, Ser 6.04 (H) 0.61 - 1.24 mg/dL   Calcium 8.7 (L) 8.9 - 10.3 mg/dL   GFR, Estimated 53 (L) >60 mL/min    Comment: (NOTE) Calculated using the CKD-EPI Creatinine Equation (2021)    Anion gap 7 5 - 15    Comment: Performed at [facility], [address], [place], [place] [address]  Albumin, pleural or peritoneal fluid      Status: None   Collection Time: 03/18/24  2:52 PM  Result Value Ref Range   Albumin, Fluid 1.5 g/dL    Comment: (NOTE) No normal range established for this test Results should be evaluated in conjunction with serum values    Fluid Type-FALB PLERUAL CYTO     Comment: Performed at [facility], [address] [place], [place] [address]  Body fluid cell count with differential     Status: Abnormal   Collection Time: 03/18/24  2:52 PM  Result Value Ref Range   Fluid Type-FCT PLEURAL CYTO    Color, Fluid AMBER (A) YELLOW   Appearance, Fluid CLOUDY (A) CLEAR   Total Nucleated Cell Count, Fluid 1,319 (H) 0 - 1,000 cu mm   Neutrophil Count, Fluid 6 0 - 25 %   Lymphs, Fluid 91 %    Monocyte-Macrophage-Serous Fluid 2 (L) 50 - 90 %   Eos, Fluid 1 %    Comment: Performed at [facility], [address], [place], [place] [address]  Lactate dehydrogenase (pleural or peritoneal fluid)     Status: Abnormal   Collection Time: 03/18/24  2:52 PM  Result Value Ref Range   LD, Fluid 131 (H) 3 - 23 U/L    Comment: (NOTE) Results should be evaluated in conjunction with serum values    Fluid Type-FLDH PLEURAL CYTO     Comment: Performed at [facility] Lab, [address] [place], [place] [address]  Protein, pleural or peritoneal fluid     Status: None   Collection Time: 03/18/24  2:52 PM  Result Value Ref Range   Total protein, fluid 3.2 g/dL    Comment: (NOTE) No normal range established for this test Results should be evaluated in conjunction with serum values    Fluid Type-FTP PLEURAL CYTO     Comment: Performed at [facility], [address] [place], [place] [address]  Urinalysis, Routine w reflex microscopic -Urine, Clean Catch     Status: Abnormal   Collection Time: 03/18/24  5:45 PM  Result Value Ref Range   Color, Urine YELLOW YELLOW   APPearance CLEAR CLEAR   Specific Gravity, Urine 1.020 1.005 - 1.030   pH 5.5 5.0 - 8.0   Glucose, UA 100 (A) NEGATIVE mg/dL   Hgb urine dipstick NEGATIVE NEGATIVE   Bilirubin Urine NEGATIVE NEGATIVE   Ketones, ur NEGATIVE NEGATIVE mg/dL   Protein, ur NEGATIVE NEGATIVE mg/dL   Nitrite NEGATIVE NEGATIVE  Leukocytes,Ua NEGATIVE NEGATIVE    Comment: Microscopic not done on urines with negative protein, blood, leukocytes, nitrite, or glucose < 500 mg/dL. Performed at [facility], [address], [place], [place] [address]   Protein / creatinine ratio, urine     Status: Abnormal   Collection Time: 03/18/24  5:45 PM  Result Value Ref Range   Creatinine, Urine 47 mg/dL   Total Protein, Urine 10 mg/dL    Comment: NO NORMAL RANGE ESTABLISHED FOR THIS TEST   Protein Creatinine Ratio 0.21 (H) 0.00 - 0.15  mg/mg[Cre]    Comment: Performed at [facility], [address] [place], [place] [address]  Heparin level (unfractionated)     Status: Abnormal   Collection Time: 03/19/24  4:27 AM  Result Value Ref Range   Heparin Unfractionated 0.78 (H) 0.30 - 0.70 IU/mL    Comment: (NOTE) The clinical reportable range upper limit is being lowered to >1.10 to align with the FDA approved guidance for the current laboratory assay.  If heparin results are below expected values, and patient dosage has  been confirmed, suggest follow up testing of antithrombin III levels. Performed at [facility], [address], [place], [place] [address]   APTT     Status: Abnormal   Collection Time: 03/19/24  4:27 AM  Result Value Ref Range   aPTT 72 (H) 24 - 36 seconds    Comment:        IF BASELINE aPTT IS ELEVATED, SUGGEST PATIENT RISK ASSESSMENT BE USED TO DETERMINE APPROPRIATE ANTICOAGULANT THERAPY. Performed at [facility], [address] [place], [place] [address]   Procalcitonin     Status: None   Collection Time: 03/19/24  4:27 AM  Result Value Ref Range   Procalcitonin <0.10 ng/mL    Comment:        Interpretation: PCT (Procalcitonin) <= 0.5 ng/mL: Systemic infection (sepsis) is not likely. Local bacterial infection is possible. (NOTE)       Sepsis PCT Algorithm           Lower Respiratory Tract                                      Infection PCT Algorithm    ----------------------------     ----------------------------         PCT < 0.25 ng/mL                PCT < 0.10 ng/mL          Strongly encourage             Strongly discourage   discontinuation of antibiotics    initiation of antibiotics    ----------------------------     -----------------------------       PCT 0.25 - 0.50 ng/mL            PCT 0.10 - 0.25 ng/mL               OR       >80% decrease in PCT            Discourage initiation of                                            antibiotics  Encourage  discontinuation           of antibiotics    ----------------------------     -----------------------------         PCT >= 0.50 ng/mL              PCT 0.26 - 0.50 ng/mL               AND        <80% decrease in PCT             Encourage initiation of                                             antibiotics       Encourage continuation           of antibiotics    ----------------------------     -----------------------------        PCT >= 0.50 ng/mL                  PCT > 0.50 ng/mL               AND         increase in PCT                  Strongly encourage                                      initiation of antibiotics    Strongly encourage escalation           of antibiotics                                     -----------------------------                                           PCT <= 0.25 ng/mL                                                 OR                                        > 80% decrease in PCT                                      Discontinue / Do not initiate                                             antibiotics  Performed at [facility], [address] [place], [place] [address]   Phosphorus     Status: None   Collection Time: 03/19/24  4:27 AM  Result  Value Ref Range   Phosphorus 3.3 2.5 - 4.6 mg/dL    Comment: Performed at [facility], [address], [place], [place] [address]  Magnesium     Status: None   Collection Time: 03/19/24  4:27 AM  Result Value Ref Range   Magnesium 2.1 1.7 - 2.4 mg/dL    Comment: Performed at [facility], [address] [place], [place] [address]  CBC with Differential/Platelet     Status: Abnormal   Collection Time: 03/19/24  4:27 AM  Result Value Ref Range   WBC 6.7 4.0 - 10.5 K/uL   RBC 4.22 4.22 - 5.81 MIL/uL   Hemoglobin 12.4 (L) 13.0 - 17.0 g/dL   HCT 82.9 56.2 - 13.0 %   MCV 95.5 80.0 - 100.0 fL   MCH 29.4 26.0 - 34.0 pg   MCHC 30.8 30.0 - 36.0 g/dL   RDW 86.5 78.4 - 69.6 %   Platelets 165  150 - 400 K/uL   nRBC 0.0 0.0 - 0.2 %   Neutrophils Relative % 76 %   Neutro Abs 5.1 1.7 - 7.7 K/uL   Lymphocytes Relative 7 %   Lymphs Abs 0.5 (L) 0.7 - 4.0 K/uL   Monocytes Relative 15 %   Monocytes Absolute 1.0 0.1 - 1.0 K/uL   Eosinophils Relative 1 %   Eosinophils Absolute 0.1 0.0 - 0.5 K/uL   Basophils Relative 0 %   Basophils Absolute 0.0 0.0 - 0.1 K/uL   Immature Granulocytes 1 %   Abs Immature Granulocytes 0.04 0.00 - 0.07 K/uL    Comment: Performed at [facility], [address] [place], [place] [address]  Brain natriuretic peptide     Status: Abnormal   Collection Time: 03/19/24  4:27 AM  Result Value Ref Range   B Natriuretic Peptide 1,086.3 (H) 0.0 - 100.0 pg/mL    Comment: Performed at [facility], [address] [place], [place] [address]  C-reactive protein     Status: Abnormal   Collection Time: 03/19/24  4:27 AM  Result Value Ref Range   CRP 3.5 (H) <1.0 mg/dL    Comment: Performed at [facility], [address] [place], [place] [address]  Basic metabolic panel     Status: Abnormal   Collection Time: 03/19/24  4:27 AM  Result Value Ref Range   Sodium 135 135 - 145 mmol/L   Potassium 3.4 (L) 3.5 - 5.1 mmol/L   Chloride 101 98 - 111 mmol/L   CO2 28 22 - 32 mmol/L   Glucose, Bld 112 (H) 70 - 99 mg/dL    Comment: Glucose reference range applies only to samples taken after fasting for at least 8 hours.   BUN 42 (H) 8 - 23 mg/dL   Creatinine, Ser 7.25 (H) 0.61 - 1.24 mg/dL   Calcium 8.5 (L) 8.9 - 10.3 mg/dL   GFR, Estimated 53 (L) >60 mL/min    Comment: (NOTE) Calculated using the CKD-EPI Creatinine Equation (2021)    Anion gap 6 5 - 15    Comment: Performed at [facility], [address], [place], [place] [address]     ROS:  Pertinent items are noted in HPI.  Physical Exam: Vitals:   03/19/24 0409 03/19/24 0500  BP:  93/64  Pulse: 67 66  Resp: 15 11  Temp:  97.9 F (36.6 C)  SpO2: 97% 100%     General: chronically  ill appearing, NAD  Heart: tachycardic, irregularly irregular rhythm, no murmurs on exam  Lungs: improved, lung sounds still diminished in left lower base, with mild crackles on the right  Abdomen: soft, non-tender, non-distended  Extremities: trace peripheral edema   Assessment/Plan: AKI: Great output with 40 mg IV Lasix  with 1.6 L output. Cr remained stable at 1.34, still meeting criteria for AKI. Volume status stable on exam. Will need to continue gentle diuresis with 40 mg IV lasix  with albumin 25%. Nephrotic syndrome r/o with urine P/CR. Hypoalbuminemia: s/p albumin x2. Could consider additional work up with INR and RUQ US .  Hypotension: Improving with midodrine 10 mg TID and holding Coreg . Appears stable with ongoing diuresis.  Right Pleural effusion: Chest tube placed 6/4. Management per PCCM.   CHF exacerbation: Management per Cardiology. Trending daily weights.  Continue strict I's and O's.  Replete electrolytes with goal potassium >4, Mag > 2. A-fib: Per cardiology, not currently in RVR.  Will need appropriate rate and rhythm control to optimize cardiac output. Coreg  held due to soft BP. Now on amiodarone  drip.  BPH  Nodular Density in Bladder: consider restarting home finasteride .  Can consider CT w/ contrast to further eval once AKI resolves. May need to be completed outpatient.    Clem Currier 03/19/2024, 8:21 AM

## 2024-03-19 NOTE — Progress Notes (Signed)
 Mobility Specialist: Progress Note   03/19/24 1435  Mobility  Activity Ambulated with assistance in hallway  Level of Assistance Standby assist, set-up cues, supervision of patient - no hands on  Assistive Device None  Distance Ambulated (ft) 350 ft  Activity Response Tolerated well  Mobility Referral Yes  Mobility visit 1 Mobility  Mobility Specialist Start Time (ACUTE ONLY) 1204  Mobility Specialist Stop Time (ACUTE ONLY) 1230  Mobility Specialist Time Calculation (min) (ACUTE ONLY) 26 min    Pt received in bed, pleasant and agreeable to mobility session. MinA to boost to stand from low surfaces, but otherwise SV for initial stand from EOB and ambulation. +2 assist from daughter for line management. Ambulated to the BR first, void successful, BM unsuccessful. Ambulated down the hallway and back without fault. Left in chair with all needs met,call bell in reach. Daughter present.    Deloria Fetch Mobility Specialist Please contact via SecureChat or Rehab office at [phone]

## 2024-03-19 NOTE — Progress Notes (Signed)
 PROGRESS NOTE                                                                                                                                                                                                             Patient Demographics:    Samuel Porter, is a 83 y.o. male, DOB - 09-Dec-1940, XBJ:[id number]  Outpatient Primary MD for the patient is [name], MD    LOS - 3  Admit date - 03/16/2024    Chief Complaint  Patient presents with   Shortness of Breath       Brief Narrative (HPI from H&P)   83 y.o. male with medical history significant for but not limited to BPH, glaucoma, hypertension, vitamin D  deficiency, atrial fibrillation on anticoagulation with recent cardioversion about 2 weeks ago, history of pleural effusions and other comorbidities who presents with worsening shortness of breath last 3 days.  Patient states that he started developing lower extremity edema that progressively got worse as well as worsening shortness of breath that started 3 days ago.  He states that he was at home and is monitoring his O2 saturations and his O2 saturation for 3 days were about 95% on room air and subsequently continued to drop over the 3 days.  Patient went to go see his pulmonologist today and is found to be hypoxic on room air so they directly admitted him to hospital for further evaluation.   In the ER workup consistent with A-fib RVR, acute on chronic diastolic CHF, recurrent pleural effusion right more than left, he also had hypoxic respiratory failure and was admitted to the hospital.     Subjective:   Patient in bed, appears comfortable, denies any headache, no fever, no chest pain or pressure, no shortness of breath , no abdominal pain. No focal weakness.   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   Patient currently on 3 to 4 L nasal cannula oxygen, pulmonary on board underwent right-sided chest tube placement on 03/18/2024, has lung entrapment syndrome which appears like a pneumothorax but it is not, pleural fluid LDH is higher than serum suggesting this likely is exudative collection, follow pleural fluid cytology, continue  supplemental oxygen, up in chair with I-S and flutter valve for pulmonary toiletry, for management of pulmonary issues to PCCM team.  Home Eliquis 03/19/2024.  Paroxysmal A-fib and RVR, acute on chronic systolic heart failure EF 35%.  Currently on amiodarone  drip for rate control, Eliquis as above, cardiology consulted, will defer cardiac issues to the cardiology team.  Monitor on telemetry.  Low-dose beta-blocker if tolerated by blood pressure, blood pressure and renal function precludes the use of ACE/ARB or Entresto.  Recurrent effusion right more than left.  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  Elevated troponin: Mildly flattened in setting of A-fib with RVR and acute on chronic CHF.  And is flat, cardiology on board.  Chest pain-free.   AKI.  Nephrology consulted.  Defer management to them including fluid management.  Glaucoma: Continue home eyedrops.   BPH: Currently holding his home finasteride    Essential hypertension/currently hypotensive: Continue blood pressure low, hold beta-blocker, midodrine added, albumin, Prosource, as needed IV hydralazine, monitor.  TSH is extremely high started on  Synthroid, will random a.m. cortisol.   Hypoalbuminemia: Patient's Albumin Level is now 2.6. CTM and Trend and repeat CMP in the AM, placed on Prosource.   Hypothyroidism.  TSH greater than 25.  Check free T4 initiate Synthroid.       Condition - Extremely Guarded  Family Communication  : Daughter bedside 03/17/2024  Code Status : Full code  Consults  : Pulmonary, cardiology, nephrology  PUD Prophylaxis :     Procedures  :     Right-sided chest tube placement by pulmonary critical care on 03/18/2024      Disposition Plan  :    Status is: Inpatient   DVT Prophylaxis  :    Place TED hose Start: 03/17/24 1010 SCDs Start: 03/16/24 1742   Lab Results  Component Value Date   PLT 165 03/19/2024    Diet :  Diet Order             Diet Heart Room service appropriate? Yes; Fluid consistency: Thin; Fluid restriction: 1500 mL Fluid  Diet effective now                    Inpatient Medications  Scheduled Meds:  (feeding supplement) PROSource Plus  30 mL Oral BID BM   cholecalciferol   1,000 Units Oral Daily   docusate sodium   100 mg Oral BID   dorzolamide -timolol   1 drop Both Eyes BID   levothyroxine  50 mcg Oral Q0600   metoprolol  succinate  25 mg Oral QPM   midodrine  10 mg Oral TID WC   Continuous Infusions:  amiodarone  30 mg/hr (03/19/24 0700)   heparin 1,200 Units/hr (03/19/24 0700)   PRN Meds:.acetaminophen  **OR** acetaminophen , albuterol , bisacodyl , hydrALAZINE, ondansetron  **OR** ondansetron  (ZOFRAN ) IV, oxyCODONE , polyethylene glycol  Antibiotics  :    Anti-infectives (From admission, onward)    None         Objective:   Vitals:   03/19/24 0408 03/19/24 0409 03/19/24 0500 03/19/24 0828  BP:   93/64 107/66  Pulse: 77 67 66 87  Resp: 16  15 11 (!) 27  Temp:   97.9 F (36.6 C) 98 F (36.7 C)  TempSrc:   Oral Oral  SpO2: 97% 97% 100% 94%  Weight:   75.4 kg   Height:        Wt Readings from Last 3 Encounters:  03/19/24 75.4 kg   03/16/24 75.6 kg  03/05/24 73 kg     Intake/Output Summary (Last 24 hours) at 03/19/2024 0956 Last data filed at 03/19/2024 0700 Gross per 24 hour  Intake 1309.1 ml  Output 4255 ml  Net -2945.9 ml     Physical Exam  Awake Alert, No new F.N deficits, Normal affect [place].AT,PERRAL Supple Neck, No JVD,   Symmetrical Chest wall movement, Good air movement bilaterally, CTAB RRR,No Gallops,Rubs or new Murmurs,  +ve B.Sounds, Abd Soft, No tenderness,   No Cyanosis, Clubbing or edema     Data Review:    Recent Labs  Lab 03/16/24 1159 03/16/24 1923 03/17/24 0338 03/18/24 0332 03/19/24 0427  WBC 8.3 7.8 6.2 6.4 6.7  HGB 14.1 13.6 12.5* 12.2* 12.4*  HCT 45.9 43.8 39.7 39.7 40.3  PLT 156 213 178 174 165  MCV 94.3 94.0 93.2 94.3 95.5  MCH 29.0 29.2 29.3 29.0 29.4  MCHC 30.7 31.1 31.5 30.7 30.8  RDW 14.7 14.4 14.5 14.4 14.3  LYMPHSABS  --  0.6* 0.5* 0.5* 0.5*  MONOABS  --  1.0 0.9 1.1* 1.0  EOSABS  --  0.0 0.0 0.1 0.1  BASOSABS  --  0.0 0.0 0.0 0.0    Recent Labs  Lab 03/16/24 1238 03/16/24 1923 03/17/24 0338 03/18/24 0332 03/19/24 0427  NA 140 139 138 137 135  K 3.8 3.7 3.5 3.5 3.4*  CL 102 102 103 101 101  CO2 27 27 30 29 28   ANIONGAP 11 10 5 7 6   GLUCOSE 109* 123* 91 101* 112*  BUN 35* 33* 38* 48* 42*  CREATININE 1.22 1.27* 1.58* 1.34* 1.34*  AST  --  21 16  --   --   ALT  --  19 17  --   --   ALKPHOS  --  100 83  --   --   BILITOT  --  1.1 0.8  --   --   ALBUMIN  --  2.6* 2.3*  --   --   CRP  --   --   --  3.9* 3.5*  PROCALCITON  --   --   --  <0.10 <0.10  TSH  --   --  27.131*  --   --   BNP  --  1,203.9*  --  1,029.3* 1,086.3*  MG  --  2.3 2.2 2.2 2.1  PHOS  --  4.4 4.8* 4.5 3.3  CALCIUM 9.5 8.6* 8.3* 8.7* 8.5*      Recent Labs  Lab 03/16/24 1238 03/16/24 1923 03/17/24 0338 03/18/24 0332 03/19/24 0427  CRP  --   --   --  3.9* 3.5*  PROCALCITON  --   --   --  <0.10 <0.10  TSH  --   --  27.131*  --   --   BNP  --  1,203.9*  --  1,029.3*  1,086.3*  MG  --  2.3 2.2 2.2 2.1  CALCIUM 9.5 8.6* 8.3* 8.7* 8.5*    --------------------------------------------------------------------------------------------------------------- No results found for: "CHOL", "HDL", "LDLCALC", "LDLDIRECT", "TRIG", "CHOLHDL"  No results found for: "HGBA1C" Recent Labs    03/17/24 0338 03/17/24 0642  TSH 27.131*  --  FREET4  --  0.71   No results for input(s): "VITAMINB12", "FOLATE", "FERRITIN", "TIBC", "IRON", "RETICCTPCT" in the last 72 hours. ------------------------------------------------------------------------------------------------------------------ Cardiac Enzymes No results for input(s): "CKMB", "TROPONINI", "MYOGLOBIN" in the last 168 hours.  Invalid input(s): "CK"  Micro Results No results found for this or any previous visit (from the past 240 hours).  Radiology Report DG Chest Port 1 View Result Date: 03/19/2024 CLINICAL DATA:  Shortness of breath EXAM: PORTABLE CHEST 1 VIEW COMPARISON:  Chest x-ray performed March 18, 2024 FINDINGS: A right basilar pneumothorax is present which extends along the periphery into the right apex. The basilar component has increased. The right basilar pigtail catheter is possibly kinked. Interstitial airspace opacities are present in both lungs, similar. Heart remains enlarged. IMPRESSION: 1. The right basilar pigtail catheter is possibly kinked. 2. Modest worsening of right basilar component of pneumothorax. These results will be called to the ordering clinician or representative by the Radiologist Assistant, and communication documented in the PACS or [employer]. Electronically Signed   By: Reagan Camera M.D.   On: 03/19/2024 06:57   DG Chest Port 1 View Result Date: 03/18/2024 CLINICAL DATA:  Right chest tube placement for a large right pleural effusion. EXAM: PORTABLE CHEST 1 VIEW COMPARISON:  03/17/2024 FINDINGS: Interval pigtail pleural catheter at the right lung base with resolution of the  previously demonstrated right pleural effusion. This has been replaced with an approximately 35% right pneumothorax without mediastinal shift. The cardiac silhouette is borderline enlarged. Patchy density is again demonstrated at the left lung base there is some patchy and linear density in the partially re-expanded right lung. Stable mild chronic interstitial prominence. Thoracic spine degenerative changes. IMPRESSION: 1. Approximately 35% right pneumothorax without mediastinal shift. 2. Interval pigtail pleural catheter placement at the right lung base with resolution of the previously demonstrated right pleural effusion. 3. Patchy and linear density in the partially re-expanded right lung, likely due to atelectasis with possible pneumonia/re-expansion pulmonary edema. 4. Stable patchy density at the left lung base suspicious for pneumonia. These results will be called to the ordering clinician or representative by the Radiologist Assistant, and communication documented in the PACS or [employer]. Electronically Signed   By: Catherin Closs M.D.   On: 03/18/2024 15:01   US  RENAL Result Date: 03/17/2024 CLINICAL DATA:  Acute kidney injury EXAM: RENAL / URINARY TRACT ULTRASOUND COMPLETE COMPARISON:  CT 07/11/2023 FINDINGS: Right Kidney: Renal measurements: 11.1 x 3.9 x 5.3 cm = volume: 120 mL. Echogenicity within normal limits. No mass or hydronephrosis visualized. Left Kidney: Renal measurements: 12.4 x 5.2 x 5.3 cm = volume: 179 mL. Echogenicity within normal limits. No mass or hydronephrosis visualized. Bladder: Soft tissue protruding along the inferior bladder from the region of the prostate. Is difficult to determine if this is related to the prostate or bladder wall. Other: Prostate enlargement. IMPRESSION: No hydronephrosis. Prostate enlargement. Soft tissue nodular area projecting along the inferior bladder measuring up to 2.1 cm, difficult to determine if this is related to the prostate or bladder wall.  When compared to prior CT, I favor this is related to enlarged prostate. This could be further evaluated with CT with contrast with delayed images through the bladder if felt clinically indicated. Electronically Signed   By: Janeece Mechanic M.D.   On: 03/17/2024 14:37     Signature  -   Lynnwood Sauer M.D on 03/19/2024 at 9:56 AM   -  To page go to [email or web address]

## 2024-03-19 NOTE — Progress Notes (Signed)
 NAME:  Samuel Porter, MRN:  [id number], DOB:  Aug 01, 1941, LOS: 3 ADMISSION DATE:  03/16/2024, CONSULTATION DATE:  03/19/24 REFERRING MD:  TRH, CHIEF COMPLAINT:  DOE   History of Present Illness:  83 year old man history of CHF presented from pulmonary clinic with worsening dyspnea exertion, hypoxemia, lower extremity swelling, and A-fib with RVR concerning for CHF exacerbation with recurrent right greater than left pleural effusion.  Patient about 2 weeks ago had a cardioversion for A-fib.  Presented to pulm clinic today with worsening shortness of breath for few days.  Associated with worsening lower extremity swelling.  He was unaware he was back in A-fib.  Noted to be progressive hypoxemic 89% yesterday.  82% on room air today in clinic.  Chest x-ray obtained showed reaccumulation, worsening right-sided pleural effusion compared to May on my review interpretation.  He states when he is had thoracentesis in the past that does improve his symptoms.  He admits to weight gain and lower extremity swelling as discussed above.  Prior fluid studies are borderline but appear transudative.  Lymphocyte predominance is peculiar and unexpected..  Labs today with markedly elevated pro BNP.  Pertinent  Medical History  CHF with reduced EF A-fib with RVR  Significant Hospital Events: Including procedures, antibiotic start and stop dates in addition to other pertinent events     Interim History / Subjective:  Feeling slightly better.  Residual pneumothorax, chest tube kinked.  Chest tube readjusted.  Airleak noted.  Repeat chest x-ray improved.  Objective    Blood pressure 100/60, pulse 87, temperature 98 F (36.7 C), temperature source Oral, resp. rate (!) 22, height 6\' 1"  (1.854 m), weight 75.4 kg, SpO2 96%.        Intake/Output Summary (Last 24 hours) at 03/19/2024 1451 Last data filed at 03/19/2024 0700 Gross per 24 hour  Intake 789.88 ml  Output 1430 ml  Net -640.12 ml   Filed Weights    03/16/24 1146 03/18/24 0500 03/19/24 0500  Weight: 75.3 kg 77.8 kg 75.4 kg    Examination: General: Frail elderly gentleman lying in stretcher HENT: Atraumatic normocephalic temporal wasting noted Lungs: Normal work of breathing, on nasal cannula Cardiovascular: Tachycardic, irregularly irregular Abdomen: Not distended Extremities: Edema noted Neuro: No focal deficits noted  Resolved problem list   Assessment and Plan   Recurrent right pleural effusion: Initial tap at [facility] 01/2024 clearly transudate. Previously tapped 5/25, relatively bland fluid.  He has cardiomyopathy reduced EF, severely dilated LA, dilate RA.  Small left pleural effusion, bilateral.  Unclear why right is so much larger but certainly it is.  He improves symptomatically with thoracentesis.  High suspicion related to cardiogenic fluid overload, unclear why asymmetric. BNP markedly elevated. 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 Sighs of RV volume overload on exam. -- Chest tube placed 6/4 -- Cytology now negative x 3, it seems fluid has transition from transudate to mild exudate likely related to chronicity as well as repeated intervention, lymphocyte predominant again, occult malignancy possible, need CT chest as discussed below once fluid drained and lung is up  Acute hypoxemic respiratory failure: Suspect in the setting of volume overload, CHF exacerbation given onset of symptoms dyspnea hypoxemia in concurrence with worsening lower extremity swelling.  Weight is up as well. As is BNP. -- Diurese as able --Plan repeat CT chest 6/6 if repeat CXR demonstrates lung remains up  PTX ex vacuo: Lack of lung expansion after chest tube placement likely lung entrapment due to chronic  nature of effusion.  -- Improved, continue chest tube to suction  Best Practice (right click and "Reselect all SmartList Selections" daily)   Per primary  Labs   CBC: Recent Labs  Lab 03/16/24 1159 03/16/24 1923  03/17/24 0338 03/18/24 0332 03/19/24 0427  WBC 8.3 7.8 6.2 6.4 6.7  NEUTROABS  --  6.1 4.8 4.7 5.1  HGB 14.1 13.6 12.5* 12.2* 12.4*  HCT 45.9 43.8 39.7 39.7 40.3  MCV 94.3 94.0 93.2 94.3 95.5  PLT 156 213 178 174 165    Basic Metabolic Panel: Recent Labs  Lab 03/16/24 1238 03/16/24 1923 03/17/24 0338 03/18/24 0332 03/19/24 0427  NA 140 139 138 137 135  K 3.8 3.7 3.5 3.5 3.4*  CL 102 102 103 101 101  CO2 27 27 30 29 28   GLUCOSE 109* 123* 91 101* 112*  BUN 35* 33* 38* 48* 42*  CREATININE 1.22 1.27* 1.58* 1.34* 1.34*  CALCIUM 9.5 8.6* 8.3* 8.7* 8.5*  MG  --  2.3 2.2 2.2 2.1  PHOS  --  4.4 4.8* 4.5 3.3   GFR: Estimated Creatinine Clearance: 45.3 mL/min (A) (by C-G formula based on SCr of 1.34 mg/dL (H)). Recent Labs  Lab 03/16/24 1923 03/17/24 0338 03/18/24 0332 03/19/24 0427  PROCALCITON  --   --  <0.10 <0.10  WBC 7.8 6.2 6.4 6.7    Liver Function Tests: Recent Labs  Lab 03/16/24 1923 03/17/24 0338  AST 21 16  ALT 19 17  ALKPHOS 100 83  BILITOT 1.1 0.8  PROT 6.6 5.9*  ALBUMIN 2.6* 2.3*   No results for input(s): "LIPASE", "AMYLASE" in the last 168 hours. No results for input(s): "AMMONIA" in the last 168 hours.  ABG    Component Value Date/Time   HCO3 38.1 (H) 07/11/2023 1659   TCO2 40 (H) 07/11/2023 1659   O2SAT 54 07/11/2023 1659     Coagulation Profile: No results for input(s): "INR", "PROTIME" in the last 168 hours.  Cardiac Enzymes: No results for input(s): "CKTOTAL", "CKMB", "CKMBINDEX", "TROPONINI" in the last 168 hours.  HbA1C: No results found for: "HGBA1C"  CBG: No results for input(s): "GLUCAP" in the last 168 hours.  Review of Systems:   No chest pain with exertion.  No orthopnea or PND.  Comprehensive review of systems otherwise negative.  Past Medical History:  He,  has a past medical history of Arthritis, BPH (benign prostatic hyperplasia), Cancer (HCC) (7 yrs ago), Cataracts, bilateral, Glaucoma, Hypertension, and Vitamin  D deficiency.   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 His smoking use included cigarettes. He started smoking about 65 years ago. He has a 15 pack-year smoking history. He has never used smokeless tobacco.  He reports that he does not currently use alcohol. He reports that he does not use drugs.   Family History:  His family history includes Cancer - Ovarian in his sister; Lymphoma in his sister.   Allergies No Known Allergies   Home Medications  Prior to Admission medications   Medication Sig Start Date End Date Taking? Authorizing Provider  acetaminophen  (TYLENOL ) 325 MG tablet Take 650 mg by mouth every 6 (six) hours as needed for moderate pain (pain score 4-6).    [provider]  albuterol  (VENTOLIN  HFA) 108 (90 Base) MCG/ACT inhaler Inhale 2 puffs into the lungs every 4 (four) hours as needed for shortness of breath. 11/22/23   [provider]  amiodarone  (PACERONE ) 200 MG tablet Take 200 mg by mouth daily.    [provider]  carvedilol  (COREG ) 3.125 MG tablet Take 1 tablet (3.125 mg total) by mouth 2 (two) times daily. 03/05/24   Hilty, Aviva Lemmings, MD  cholecalciferol  (VITAMIN D ) 1000 units tablet Take 1,000  Units by mouth daily.    [provider]  dapagliflozin  propanediol (FARXIGA ) 10 MG TABS tablet Take 1 tablet (10 mg total) by mouth daily before breakfast. 02/26/24   Arnoldo Lapping, MD  dorzolamide -timolol  (COSOPT ) 22.3-6.8 MG/ML ophthalmic solution Place 1 drop into both eyes 2 (two) times daily. 07/09/16   [provider]  ELIQUIS 5 MG TABS tablet Take 5 mg by mouth 2 (two) times daily.    [provider]  finasteride  (PROSCAR ) 5 MG tablet Take 5 mg by mouth daily.    [provider]  furosemide  (LASIX ) 20 MG tablet Take 1 tablet (20 mg total) by mouth daily. 02/07/24 05/07/24  Cooper, Michael, MD  polyethylene glycol (MIRALAX  / GLYCOLAX ) 17 g packet Take 17 g by mouth 2 (two) times daily as needed. 07/16/23   Maczis, Michael M, PA-C  sildenafil (VIAGRA) 25 MG tablet Take 25 mg by mouth as needed for erectile dysfunction.    [provider]  spironolactone  (ALDACTONE ) 25 MG tablet Take 0.5 tablets (12.5 mg total) by mouth daily. 02/26/24   Arnoldo Lapping, MD  valsartan  (DIOVAN ) 40 MG tablet Take 1 tablet (40 mg total) by mouth 2 (two) times daily. 12/19/23   Arnoldo Lapping, MD     Critical care time: n/a    Guerry Leek, MD See Tilford Foley

## 2024-03-19 NOTE — Progress Notes (Addendum)
 PHARMACY - ANTICOAGULATION CONSULT NOTE  Pharmacy Consult for Heparin Indication: atrial fibrillation  No Known Allergies  Patient Measurements: Height: 6\' 1"  (185.4 cm) Weight: 75.4 kg (166 lb 3.6 oz) IBW/kg (Calculated) : 79.9 HEPARIN DW (KG): 75.3  Vital Signs: Temp: 98 F (36.7 C) (06/05 0828) Temp Source: Oral (06/05 0828) BP: 107/66 (06/05 0828) Pulse Rate: 87 (06/05 0828)  Labs: Recent Labs    03/17/24 0338 03/18/24 0332 03/19/24 0427  HGB 12.5* 12.2* 12.4*  HCT 39.7 39.7 40.3  PLT 178 174 165  APTT 63* 106* 72*  HEPARINUNFRC >1.10* >1.10* 0.78*  CREATININE 1.58* 1.34* 1.34*    Estimated Creatinine Clearance: 45.3 mL/min (A) (by C-G formula based on SCr of 1.34 mg/dL (H)).  Assessment: 83 yo M on apixaban PTA for afib, held for thoracentesis. Last dose 6/2 8am per patient. Pharmacy consulted for IV heparin.    Heparin drip held on 6/4 am for chest tube placement in the afternoon. Heparin resumed 6/4 pm, 8 hrs after chest tube placement per CCM/pulmonary.   aPTT therapeutic (72 seconds) on 1200 units/hr.  Heparin level 0.78, coming down but likely still some false elevation from recent apixaban doses. CBC stable.  Goal of Therapy:  Heparin level 0.3-0.7 units/ml aPTT 66-102 seconds Monitor platelets by anticoagulation protocol: Yes   Plan:  Continue heparin drip at 1200 units/hr Daily aPTT and heparin level until correlating; daily CBC Apixaban on hold.  Addendum: 10:11 AM To transition back to Eliquis 5 mg PO BID. Heparin drip to stop when giving first Eliquis dose.   Adolphus Akin, RPh 03/19/2024,8:47 AM

## 2024-03-19 NOTE — Care Management Important Message (Signed)
 Important Message  Patient Details  Name: Samuel Porter MRN: [id number] Date of Birth: 07/03/1941   Important Message Given:  Yes - Medicare IM     Wynonia Hedges 03/19/2024, 1:58 PM

## 2024-03-19 NOTE — Progress Notes (Signed)
 Rounding Note    Patient Name: Samuel Porter Date of Encounter: 03/19/2024  [facility] Cardiologist: Arnoldo Lapping, MD   Subjective   S/P chest tube placement yesterday. 2.75 L out of chest tube. Sleeping comfortably today.  Inpatient Medications    Scheduled Meds:  (feeding supplement) PROSource Plus  30 mL Oral BID BM   apixaban  5 mg Oral BID   cholecalciferol   1,000 Units Oral Daily   docusate sodium   100 mg Oral BID   dorzolamide -timolol   1 drop Both Eyes BID   levothyroxine  50 mcg Oral Q0600   metoprolol  succinate  25 mg Oral QPM   midodrine  10 mg Oral TID WC   Continuous Infusions:  amiodarone  30 mg/hr (03/19/24 1600)   PRN Meds: acetaminophen  **OR** acetaminophen , albuterol , bisacodyl , hydrALAZINE, ondansetron  **OR** ondansetron  (ZOFRAN ) IV, oxyCODONE , polyethylene glycol   Vital Signs    Vitals:   03/19/24 0409 03/19/24 0500 03/19/24 0828 03/19/24 1300  BP:  93/64 107/66 100/60  Pulse: 67 66 87 87  Resp: 15 11 (!) 27 (!) 22  Temp:  97.9 F (36.6 C) 98 F (36.7 C)   TempSrc:  Oral Oral   SpO2: 97% 100% 94% 96%  Weight:  75.4 kg    Height:        Intake/Output Summary (Last 24 hours) at 03/19/2024 1734 Last data filed at 03/19/2024 1600 Gross per 24 hour  Intake 949.35 ml  Output 1430 ml  Net -480.65 ml      03/19/2024    5:00 AM 03/18/2024    5:00 AM 03/16/2024   11:46 AM  Last 3 Weights  Weight (lbs) 166 lb 3.6 oz 171 lb 8.3 oz 166 lb  Weight (kg) 75.4 kg 77.8 kg 75.297 kg      Telemetry    Rate controlled atrial fibrillation with Pvcs vs aberrant conduction - Personally Reviewed  Physical Exam   GEN: Frail, thin gentleman, in NAD NECK: JVD elevated to mid neck at 45 degrees CARDIAC: irregularly irregular rhythm, normal S1 and S2, no rubs or gallops. No murmur. VASCULAR: Radial pulses 2+ bilaterally.  RESPIRATORY:  largely clear bilaterally ABDOMEN: Soft, non-tender, non-distended MUSCULOSKELETAL:  Moves all 4 limbs  independently SKIN: Warm and dry, 1+ bilateral LE edema NEUROLOGIC:  No focal neuro deficits noted. PSYCHIATRIC:  Normal affect    New pertinent results (labs, ECG, imaging, cardiac studies)    Reviewed below  Assessment & Plan    Hypervolemia/third spacing Recurrent pleural effusion Hypoalbuminemia -see my extensive comments from 6/3. Overall complex picture. Not completely consistent with cardiac etiology, given that he has had mainly unilateral R pleural effusion, and there was significant third spacing (pleural, pericardial, and ascites fluid) seen on prior echo while right atrial pressure was normal at 3. -this suggests that diuresis may be difficult. Risk is for intravascular depletion without significantly changing third spaced fluid. Nephrology has been consulted, patient has also received albumin. -prior echo visually not concerning for end stage amyloid, which is what would be expected for this degree of volume overload. Tissue doppler velocities, LV thickness/appearance do not suggest advanced cardiac amyloid. If there is systemic concern for amyloid, defer workup to primary team. Discussed with family and patient yesterday--while we cannot exclude early cardiac amyloid, it would not be expected to have this degree of volume overload. -I ordered limited echo today, not able to be completed today, will hopefully be able to get early AM tomorrow -CRP is elevated, suggests systemic inflammatory  process also involved. Procalcitonin <0.1, suggests against infection  Hypotension: now on midodrine for support. All outpatient antihypertensives on hold.  Acute on chronic systolic and diastolic heart failure: as above, difficult situation. EF likely affected by afib, and as noted here, will be difficult to keep him out of afib with ongoing issues. No blood pressure room for GDMT. Diuresis per nephrology, as above. Can consider limited echo this admission, though with chest tube and afib images  may be technically challenging  Paroxysmal vs persistent atrial fibrillation: cardioverted 5/22, needs uninterrupted anticoagulation for at least 4 weeks post procedure, on heparin drip pending chest tube, restart DOAC when able from pulm perspective. -we discussed that given recurrent pleural effusions and the inflammation/irritation associated with that, may be difficult to keep him out of atrial fibrillation at this time. -Blood pressure too low for beta blockers, no calcium channel blockers given reduced EF, with AKI avoiding digoxin. 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

## 2024-03-20 ENCOUNTER — Inpatient Hospital Stay (HOSPITAL_COMMUNITY)

## 2024-03-20 ENCOUNTER — Encounter (HOSPITAL_COMMUNITY): Payer: Self-pay | Admitting: Pulmonary Disease

## 2024-03-20 DIAGNOSIS — Z712 Person consulting for explanation of examination or test findings: Secondary | ICD-10-CM

## 2024-03-20 DIAGNOSIS — I50811 Acute right heart failure: Secondary | ICD-10-CM | POA: Diagnosis not present

## 2024-03-20 DIAGNOSIS — I4819 Other persistent atrial fibrillation: Secondary | ICD-10-CM | POA: Diagnosis not present

## 2024-03-20 DIAGNOSIS — I4891 Unspecified atrial fibrillation: Secondary | ICD-10-CM | POA: Diagnosis not present

## 2024-03-20 DIAGNOSIS — J90 Pleural effusion, not elsewhere classified: Secondary | ICD-10-CM | POA: Diagnosis not present

## 2024-03-20 DIAGNOSIS — I272 Pulmonary hypertension, unspecified: Secondary | ICD-10-CM

## 2024-03-20 DIAGNOSIS — I5043 Acute on chronic combined systolic (congestive) and diastolic (congestive) heart failure: Secondary | ICD-10-CM | POA: Diagnosis not present

## 2024-03-20 DIAGNOSIS — J849 Interstitial pulmonary disease, unspecified: Secondary | ICD-10-CM

## 2024-03-20 DIAGNOSIS — I5022 Chronic systolic (congestive) heart failure: Secondary | ICD-10-CM

## 2024-03-20 DIAGNOSIS — J9601 Acute respiratory failure with hypoxia: Secondary | ICD-10-CM | POA: Diagnosis not present

## 2024-03-20 DIAGNOSIS — E8809 Other disorders of plasma-protein metabolism, not elsewhere classified: Secondary | ICD-10-CM | POA: Diagnosis not present

## 2024-03-20 LAB — BASIC METABOLIC PANEL WITH GFR
Anion gap: 7 (ref 5–15)
BUN: 42 mg/dL — ABNORMAL HIGH (ref 8–23)
CO2: 28 mmol/L (ref 22–32)
Calcium: 8.7 mg/dL — ABNORMAL LOW (ref 8.9–10.3)
Chloride: 102 mmol/L (ref 98–111)
Creatinine, Ser: 1.32 mg/dL — ABNORMAL HIGH (ref 0.61–1.24)
GFR, Estimated: 54 mL/min — ABNORMAL LOW (ref >60)
Glucose, Bld: 101 mg/dL — ABNORMAL HIGH (ref 70–99)
Potassium: 4 mmol/L (ref 3.5–5.1)
Sodium: 137 mmol/L (ref 135–145)

## 2024-03-20 LAB — BRAIN NATRIURETIC PEPTIDE: B Natriuretic Peptide: 1050.7 pg/mL — ABNORMAL HIGH (ref 0.0–100.0)

## 2024-03-20 LAB — MAGNESIUM: Magnesium: 2.1 mg/dL (ref 1.7–2.4)

## 2024-03-20 LAB — CBC WITH DIFFERENTIAL/PLATELET
Abs Immature Granulocytes: 0.02 10*3/uL (ref 0.00–0.07)
Basophils Absolute: 0 10*3/uL (ref 0.0–0.1)
Basophils Relative: 0 %
Eosinophils Absolute: 0.1 10*3/uL (ref 0.0–0.5)
Eosinophils Relative: 1 %
HCT: 40.9 % (ref 39.0–52.0)
Hemoglobin: 12.6 g/dL — ABNORMAL LOW (ref 13.0–17.0)
Immature Granulocytes: 0 %
Lymphocytes Relative: 6 %
Lymphs Abs: 0.4 10*3/uL — ABNORMAL LOW (ref 0.7–4.0)
MCH: 28.9 pg (ref 26.0–34.0)
MCHC: 30.8 g/dL (ref 30.0–36.0)
MCV: 93.8 fL (ref 80.0–100.0)
Monocytes Absolute: 0.9 10*3/uL (ref 0.1–1.0)
Monocytes Relative: 14 %
Neutro Abs: 4.8 10*3/uL (ref 1.7–7.7)
Neutrophils Relative %: 79 %
Platelets: 143 10*3/uL — ABNORMAL LOW (ref 150–400)
RBC: 4.36 MIL/uL (ref 4.22–5.81)
RDW: 14.5 % (ref 11.5–15.5)
WBC: 6.2 10*3/uL (ref 4.0–10.5)
nRBC: 0 % (ref 0.0–0.2)

## 2024-03-20 LAB — ECHOCARDIOGRAM LIMITED
AR max vel: 2.47 cm2
AV Area VTI: 2.19 cm2
AV Area mean vel: 2.32 cm2
AV Mean grad: 2 mmHg
AV Peak grad: 4.3 mmHg
Ao pk vel: 1.04 m/s
Area-P 1/2: 5.54 cm2
Height: 73 in
MV M vel: 2.93 m/s
MV Peak grad: 34.3 mmHg
S' Lateral: 3.7 cm
Weight: 2659.63 [oz_av]

## 2024-03-20 LAB — PROCALCITONIN: Procalcitonin: <0.1 ng/mL

## 2024-03-20 LAB — PHOSPHORUS: Phosphorus: 2.9 mg/dL (ref 2.5–4.6)

## 2024-03-20 LAB — C-REACTIVE PROTEIN: CRP: 3.9 mg/dL — ABNORMAL HIGH (ref <1.0)

## 2024-03-20 MED ORDER — FUROSEMIDE 10 MG/ML IJ SOLN
40.0000 mg | Freq: Every day | INTRAMUSCULAR | Status: DC
  Administered 2024-03-21: 40 mg via INTRAVENOUS
  Filled 2024-03-20: qty 4

## 2024-03-20 MED ORDER — AMIODARONE HCL 200 MG PO TABS
200.0000 mg | ORAL_TABLET | Freq: Every day | ORAL | Status: DC
  Administered 2024-03-21 – 2024-03-27 (×7): 200 mg via ORAL
  Filled 2024-03-20 (×7): qty 1

## 2024-03-20 NOTE — Progress Notes (Signed)
 Physical Therapy Treatment [name] MRN: [id number] DOB: 1941-07-22 Today's Date: 03/20/2024   History of Present Illness 83 year old man presented 6/2 from pulmonary clinic with worsening dyspnea exertion, hypoxemia, lower extremity swelling, and A-fib with RVR concerning for CHF exacerbation with recurrent right greater than left pleural effusion, and AKI.  PMHx includes BPH, glaucoma, hypertension, vitamin D  deficiency, atrial fibrillation on anticoagulation with recent cardioversion ~2 wks ago.    PT Comments  Pt received in supine, agreeable to therapy session and with good participation and tolerance for transfer and gait training. Poor pleth signal on Spo2 monitor with cold fingers but improved with warm blanket over hands, no significant dypsnea on 2L O2 [place] during functional activity. HR variable (Afib) and no acute s/sx distress. Chest tube collection site appears nearly full, RN notified. Pt needing increased assist, up to minA for sit<>stand lift assist and lowering assist and pt with R knee hyperextension during stance phase on RLE, may benefit from further strengthening post-acute as pt tending to drift somewhat during gait trial with RW. Pt continues to benefit from PT services to progress toward functional mobility goals.     If plan is discharge home, recommend the following: A little help with walking and/or transfers;A little help with bathing/dressing/bathroom;Assistance with cooking/housework;Assist for transportation   Can travel by private vehicle        Equipment Recommendations  None recommended by PT    Recommendations for Other Services       Precautions / Restrictions Precautions Precautions: Fall Recall of Precautions/Restrictions: Intact Precaution/Restrictions Comments: chest tube, noisy SpO2 signal on fingers Restrictions Weight Bearing Restrictions Per Provider Order: No     Mobility  Bed Mobility Overal bed mobility: Needs  Assistance Bed Mobility: Supine to Sit, Sit to Supine     Supine to sit: Supervision Sit to supine: Supervision   General bed mobility comments: supervision for safety due to lines; pt can be impulsive to move before being cued    Transfers Overall transfer level: Needs assistance Equipment used: Rolling walker (2 wheels) Transfers: Sit to/from Stand Sit to Stand: Min assist           General transfer comment: minA to stand from EOB>RW (attempted with supervision from lowest bed height but unable); minA for stand>sit due to decreased eccentric control, cues for safe UE placement.    Ambulation/Gait Ambulation/Gait assistance: Contact guard assist Gait Distance (Feet): 200 Feet Assistive device: Rolling walker (2 wheels) Gait Pattern/deviations: Step-through pattern, Decreased stride length, Drifts right/left, Narrow base of support, Knee hyperextension - right Gait velocity: dec Gait velocity interpretation: <1.31 ft/sec, indicative of household ambulator   General Gait Details: Cues for upright posture, pursed-lip breathing, techniques to avoid R knee hyperextension, wider BOS for improved stability and directional navigation. CGA for safety given slight veering from straight path. Noisy SpO2 signal with pt fingers pale and poor perfusion. Attempted Ear SpO2 but also with poor signal. Upon return to room, pt given warm blanket to wrap around his hands and SpO2 signal improved to 100% on 2L/min O2 [place], RN notified.   Stairs             Wheelchair Mobility     Tilt Bed    Modified Rankin (Stroke Patients Only)       Balance Overall balance assessment: Mild deficits observed, not formally tested (steadier with RW today, esp given chest tube/lines)  Communication Communication Communication: No apparent difficulties  Cognition Arousal: Alert Behavior During Therapy: WFL for tasks assessed/performed    PT - Cognitive impairments: No apparent impairments                       PT - Cognition Comments: pt sitting up EOB before being instructed to, causing bed alarm to sound. Pt had been told to wait for lines to be arranged first. Pleasantly cooperative and pt very eager to mobilize OOB. Following commands: Intact      Cueing Cueing Techniques: Verbal cues  Exercises      General Comments General comments (skin integrity, edema, etc.): chest tube appears nearly full, RN notified only a few hundred mL left to fill, RN to notify for a new collection device. HR in Afib with occasional drops to ~45-48 bpm but only briefly then increases to ~70-80's bpm; BP 118/71 taken ~1800 in supine post-exertion      Pertinent Vitals/Pain Pain Assessment Pain Assessment: Faces Faces Pain Scale: Hurts a little bit Pain Location: CT site Pain Descriptors / Indicators: Discomfort Pain Intervention(s): Monitored during session, Repositioned    Home Living                          Prior Function            PT Goals (current goals can now be found in the care plan section) Acute Rehab PT Goals Patient Stated Goal: Go home PT Goal Formulation: With patient Time For Goal Achievement: 03/31/24 Progress towards PT goals: Progressing toward goals    Frequency    Min 2X/week      PT Plan      Co-evaluation              AM-PAC PT "6 Clicks" Mobility   Outcome Measure  Help needed turning from your back to your side while in a flat bed without using bedrails?: None Help needed moving from lying on your back to sitting on the side of a flat bed without using bedrails?: A Little (due to lines) Help needed moving to and from a bed to a chair (including a wheelchair)?: A Little Help needed standing up from a chair using your arms (e.g., wheelchair or bedside chair)?: A Little Help needed to walk in hospital room?: A Little Help needed climbing 3-5 steps with a railing?  : A Lot 6 Click Score: 18    End of Session Equipment Utilized During Treatment: Gait belt Activity Tolerance: Patient tolerated treatment well Patient left: in bed;with call bell/phone within reach;with bed alarm set;Other (comment);with SCD's reapplied (heels floated, HOB ~40 deg) Nurse Communication: Mobility status;Other (comment) (chest tube collection device almost full) PT Visit Diagnosis: Other abnormalities of gait and mobility (R26.89);Muscle weakness (generalized) (M62.81)     Time: 1610-9604 PT Time Calculation (min) (ACUTE ONLY): 40 min  Charges:    $Gait Training: 23-37 mins $Therapeutic Activity: 8-22 mins PT General Charges $$ ACUTE PT VISIT: 1 Visit                     Raylynn Hersh P., PTA Acute Rehabilitation Services Secure Chat Preferred 9a-5:30pm Office: [phone]    Mariel Shope [facility] 03/20/2024, 7:09 PM

## 2024-03-20 NOTE — Consult Note (Addendum)
 [address]       [place] [address]             [phone]        [facility] Medical Record #[id number] Date of Birth: May 03, 1941  Referring: No ref. provider found Primary Care: Samuel Coho, MD Primary Cardiologist:Samuel Arlester Ladd, MD  Reason for Consult: Consideration of pleural biopsy to evaluate recurrent pleural effusions.  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  He was found to have a pleural effusion in April of this year that was drained with thoracentesis at [facility].  It was reported to be a transudative fluid.  He had a recurrent pleural effusion that was drained on 03/03/2024 by interventional radiology yielding 1.3 L of serous fluid.  The sample was felt to be borderline exudative.  Cytology was negative for any malignancy.  He was being seen in follow-up at the pulmonology office by Ms. Samuel Coral,  NP on 03/16/2024 when he was noted to be hypoxic with oxygen saturation of 83% on room air.  Chest x-ray was obtained showing an enlarging right pleural effusion.  [facility] was [facility] to [facility] for further investigation.  A chest tube was placed by critical care medicine on 03/18/2024 with 2700 mL drained immediately and another 750 mL over the past 24 hours.  He has been seen in consult by family medicine, nephrology, critical care medicine, and cardiology.  Differential diagnoses to explain recurrent effusion have included interstitial lung disease, pneumonitis, amiodarone  toxicity, decompensated heart failure, and occult malignancy.  CT surgery has been asked to see [facility] to consider open pleural biopsy. [facility] is currently resting in bed with his son  and daughter at the bedside.  He denies any pain or shortness of breath at rest.  He said he has had about a 40 pound weight loss since the beginning of last year and relates this to poor taste sensation following the radical neck dissection.   Zubrod Score: At the time of surgery this patient's most appropriate activity status/level should be described as: []     0    Normal activity, no symptoms []     1    Restricted in physical strenuous activity but ambulatory, able to do out light work []     2    Ambulatory and capable of self care, unable to do work activities, up and about                 more than 50%  Of the time                            [x]     3    Only limited self care, in bed greater than 50% of waking hours []     4    Completely disabled, no self care, confined to bed or chair []     5    Moribund  Past Medical History:  Diagnosis Date   Arthritis    oa   BPH (benign prostatic hyperplasia)    Cancer (HCC) 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   Cataracts, bilateral    Glaucoma  Hypertension    Vitamin D  deficiency     Past Surgical History:  Procedure Laterality Date   CARDIOVERSION N/A 03/05/2024   Procedure: CARDIOVERSION;  Surgeon: Samuel Lites, MD;  Location: [facility] INVASIVE CV LAB;  Service: Cardiovascular;  Laterality: N/A;   CHEST TUBE INSERTION Right 03/18/2024   Procedure: CHEST TUBE INSERTION;  Surgeon: Samuel Leek, MD;  Location: [facility] ENDOSCOPY;  Service: Pulmonary;  Laterality: Right;   CHOLECYSTECTOMY     colonscopy  2017   IR THORACENTESIS ASP PLEURAL SPACE W/IMG GUIDE  03/03/2024   JOINT REPLACEMENT Right    hip    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   Procedure: LEFT TOTAL HIP ARTHROPLASTY ANTERIOR APPROACH;  Surgeon: Liliane Rei, MD;  Location: [facility] ORS;  Service: Orthopedics;  Laterality: Left;   VENTRAL HERNIA REPAIR N/A 07/12/2023   Procedure: HERNIA REPAIR VENTRAL;  Surgeon: Junie Olds, MD;  Location: [facility] OR;  Service:  General;  Laterality: N/A;    Social History   Tobacco Use  Smoking Status Former   Current packs/day: 0.00   Average packs/day: 1.5 packs/day for 10.0 years (15.0 ttl pk-yrs)   Types: Cigarettes   Start date: 48   Quit date: 21   Years since quitting: 55.4  Smokeless Tobacco Never    Social History   Substance and Sexual Activity  Alcohol Use Not Currently   Comment: 1-2 glass wine per day     No Known Allergies  Current Facility-Administered Medications  Medication Dose Route Frequency Provider Last Rate Last Admin   (feeding supplement) PROSource Plus liquid 30 mL  30 mL Oral BID [facility], Samuel K, MD   30 mL at 03/20/24 0844   acetaminophen  (TYLENOL ) tablet 650 mg  650 mg Oral Q6H PRN [facility], Samuel Latif, DO       Or   acetaminophen  (TYLENOL ) suppository 650 mg  650 mg Rectal Q6H PRN [facility], Samuel Latif, DO       albuterol  (PROVENTIL ) (2.5 MG/3ML) 0.083% nebulizer solution 3 mL  3 mL Inhalation Q6H PRN [facility], Samuel K, MD   3 mL at 03/18/24 1157   amiodarone  (NEXTERONE  PREMIX) 360-4.14 MG/200ML-% (1.8 mg/mL) IV infusion  30 mg/hr Intravenous Continuous Samuel Donning, MD 16.67 mL/hr at 03/20/24 0431 30 mg/hr at 03/20/24 0431   [START ON 03/21/2024] amiodarone  (PACERONE ) tablet 200 mg  200 mg Oral Daily Samuel Donning, MD       apixaban  (ELIQUIS ) tablet 5 mg  5 mg Oral BID [facility], Samuel K, MD   5 mg at 03/20/24 0844   bisacodyl  (DULCOLAX) suppository 10 mg  10 mg Rectal Daily PRN [facility], Samuel Latif, DO       cholecalciferol  (VITAMIN D3) 25 MCG (1000 UNIT) tablet 1,000 Units  1,000 Units Oral Daily [facility], Samuel Latif, DO   1,000 Units at 03/20/24 5409   docusate sodium  (COLACE) capsule 100 mg  100 mg Oral BID [facility], Samuel Latif, DO   100 mg at 03/20/24 0845   dorzolamide -timolol  (COSOPT ) 2-0.5 % ophthalmic solution 1 drop  1 drop Both Eyes BID [facility], Samuel [place], DO   1 drop at 03/20/24 0900   hydrALAZINE  (APRESOLINE ) injection 10 mg  10 mg  Intravenous Q6H PRN [facility], Samuel Latif, DO       levothyroxine  (SYNTHROID ) tablet 50 mcg  50 mcg Oral Q0600 [facility], Samuel K, MD   50 mcg at 03/20/24 8119   metoprolol  succinate (TOPROL -XL) 24 hr tablet 25 mg  25 mg Oral QPM Williams, Evan,  PA-C   25 mg at 03/19/24 1844   midodrine  (PROAMATINE ) tablet 10 mg  10 mg Oral TID WC Clem Currier, DO   10 mg at 03/20/24 0844   ondansetron  (ZOFRAN ) tablet 4 mg  4 mg Oral Q6H PRN [facility], Samuel Latif, DO       Or   ondansetron  (ZOFRAN ) injection 4 mg  4 mg Intravenous Q6H PRN [facility], Samuel Latif, DO       oxyCODONE  (Oxy IR/ROXICODONE ) immediate release tablet 5 mg  5 mg Oral Q4H PRN [facility], Samuel Latif, DO       polyethylene glycol (MIRALAX  / GLYCOLAX ) packet 17 g  17 g Oral Daily PRN [facility], Samuel Latif, DO   17 g at 03/19/24 1847    Medications Prior to Admission  Medication Sig Dispense Refill Last Dose/Taking   acetaminophen  (TYLENOL ) 325 MG tablet Take 650 mg by mouth daily as needed for moderate pain (pain score 4-6) or mild pain (pain score 1-3).   03/13/2024   albuterol  (VENTOLIN  HFA) 108 (90 Base) MCG/ACT inhaler Inhale 2 puffs into the lungs every 4 (four) hours as needed for shortness of breath.   03/15/2024   amiodarone  (PACERONE ) 200 MG tablet Take 200 mg by mouth daily.   03/15/2024   carvedilol  (COREG ) 3.125 MG tablet Take 1 tablet (3.125 mg total) by mouth 2 (two) times daily. 60 tablet 0 03/16/2024   cholecalciferol  (VITAMIN D ) 1000 units tablet Take 1,000 Units by mouth daily.   03/15/2024   dapagliflozin  propanediol (FARXIGA ) 10 MG TABS tablet Take 1 tablet (10 mg total) by mouth daily before breakfast. 30 tablet 11 03/16/2024   dorzolamide -timolol  (COSOPT ) 22.3-6.8 MG/ML ophthalmic solution Place 1 drop into both eyes 2 (two) times daily.   03/16/2024   ELIQUIS  5 MG TABS tablet Take 5 mg by mouth 2 (two) times daily.   03/16/2024 at  8:00 AM   finasteride  (PROSCAR ) 5 MG tablet Take 5 mg by mouth daily.   03/15/2024   furosemide  (LASIX ) 20 MG tablet  Take 1 tablet (20 mg total) by mouth daily. 90 tablet 3 03/16/2024   polyethylene glycol (MIRALAX  / GLYCOLAX ) 17 g packet Take 17 g by mouth 2 (two) times daily as needed. (Patient taking differently: Take 17 g by mouth daily as needed for moderate constipation or mild constipation.)   Unknown   sildenafil (VIAGRA) 25 MG tablet Take 25 mg by mouth as needed for erectile dysfunction.   Unknown   spironolactone  (ALDACTONE ) 25 MG tablet Take 0.5 tablets (12.5 mg total) by mouth daily. 45 tablet 3 03/16/2024   valsartan  (DIOVAN ) 40 MG tablet Take 1 tablet (40 mg total) by mouth 2 (two) times daily. 180 tablet 3 03/16/2024   amLODipine  (NORVASC ) 5 MG tablet TAKE 1 TABLET BY MOUTH DAILY for 90 (Patient not taking: Reported on 03/17/2024)   Not Taking    Family History  Problem Relation Age of Onset   Cancer - Ovarian Sister    Lymphoma Sister      Review of Systems:     Cardiac Review of Systems: Y or  [    ]= no  Chest Pain [    ]  Resting SOB [ x  ] Exertional SOB  [ x ]  Orthopnea [  ]   Pedal Edema [   ]    Palpitations [  ] Syncope  [  ]   Presyncope [   ]  General Review of Systems: [Y] = yes [  ]=  no Constitional: recent weight change [ x ]; anorexia [x  ]; fatigue [  ]; nausea [  ]; night sweats [  ]; fever [  ]; or chills [  ]                                                                 Eye : blurred vision [  ]; diplopia [   ]; vision changes [  ];  Amaurosis fugax[  ]; Resp: cough [  ];  wheezing[  ];  hemoptysis[  ]; shortness of breath[x  ]; paroxysmal nocturnal dyspnea[  ]; dyspnea on exertion[  ]; or orthopnea[  ];  GI:  gallstones[  ], vomiting[  ];  dysphagia[  ]; melena[  ];  hematochezia [  ]; heartburn[  ];   Hx of  Colonoscopy[  ]; GU: kidney stones [  ]; hematuria[  ];   dysuria [  ];  nocturia[  ];  history of     obstruction [  ]; urinary frequency [  ]             Skin: rash, swelling[  ];, hair loss[  ];  peripheral edema[  ];  or itching[  ]; Musculosketetal: myalgias[  ];   joint swelling[  ];  joint erythema[  ];  joint pain[  ];  back pain[  ];  Heme/Lymph: bruising[  ];  bleeding[  ];  anemia[  ];  Neuro: TIA[  ];  headaches[  ];  stroke[  ];  vertigo[  ];  seizures[  ];   paresthesias[  ];  difficulty walking[  ];  Psych:depression[  ]; anxiety[  ];  Endocrine: diabetes[  ];  thyroid  dysfunction[  ];                Physical Exam: BP 129/77 (BP Location: Left Arm)   Pulse 75   Temp (!) 97.4 F (36.3 C) (Oral)   Resp (!) 23   Ht 6\' 1"  (1.854 m)   Wt 75.4 kg   SpO2 94%   BMI 21.93 kg/m    General appearance: alert, cooperative, cachectic, and no distress Head: Normocephalic, without obvious abnormality, atraumatic Neck: no adenopathy, no carotid bruit, and he is status post right radical neck dissection.  The right jugular vein is prominent Lymph nodes: No cervical or clavicular adenopathy Resp: Breath sounds are shallow bilaterally more so on the right.  No crackles or wheezes.  A pigtail catheter exits the chest on the right side and is connected to a Pleur-evac with maroon serosanguineous fluid. Cardio: Regular rate irregular rhythm.  I did not hear murmur. GI: Soft, no tenderness, no obvious organomegaly Extremities: Multiple bruises on the upper extremities related to current and previous IV sites.  Otherwise, no deformities or skin lesions.  All are well-perfused. Neurologic: Grossly normal  Diagnostic Studies & Laboratory data:     Recent Radiology Findings:   DG CHEST PORT 1 VIEW Result Date: 03/20/2024 CLINICAL DATA:  Chest tube in place. EXAM: PORTABLE CHEST 1 VIEW COMPARISON:  March 19, 2024. FINDINGS: Stable cardiomediastinal silhouette. Mild central pulmonary vascular congestion is noted with possible bilateral pulmonary edema. Stable right-sided chest tube with stable small right hydropneumothorax. Bony thorax is unremarkable. IMPRESSION: Stable right-sided chest  tube with stable small right hydropneumothorax. Mild central pulmonary  vascular congestion with possible bilateral pulmonary edema. Electronically Signed   By: Rosalene Colon M.D.   On: 03/20/2024 08:22   DG CHEST PORT 1 VIEW Result Date: 03/19/2024 CLINICAL DATA:  [id number] Chest tube in place [id number]. EXAM: PORTABLE CHEST 1 VIEW COMPARISON:  03/19/2024, 6:31 a.m. FINDINGS: There is small residual right pneumothorax, decreased since the recent prior study, status post presumed pleural drainage catheter exchange. Redemonstration of chronic interstitial and alveolar opacities throughout bilateral lungs without significant interval change. No left pneumothorax. There is blunting of bilateral lateral costophrenic angles, concerning for underlying bilateral trace pleural effusions. Stable cardio-mediastinal silhouette. No acute osseous abnormalities. The soft tissues are within normal limits. IMPRESSION: 1. Small residual right pneumothorax, decreased since the recent prior study, status post presumed pleural drainage catheter exchange. 2. Redemonstration of chronic interstitial and alveolar opacities throughout bilateral lungs without significant interval change. Electronically Signed   By: Beula Brunswick M.D.   On: 03/19/2024 11:36   DG Chest Port 1 View Result Date: 03/19/2024 CLINICAL DATA:  Shortness of breath EXAM: PORTABLE CHEST 1 VIEW COMPARISON:  Chest x-ray performed March 18, 2024 FINDINGS: A right basilar pneumothorax is present which extends along the periphery into the right apex. The basilar component has increased. The right basilar pigtail catheter is possibly kinked. Interstitial airspace opacities are present in both lungs, similar. Heart remains enlarged. IMPRESSION: 1. The right basilar pigtail catheter is possibly kinked. 2. Modest worsening of right basilar component of pneumothorax. These results will be called to the ordering clinician or representative by the Radiologist Assistant, and communication documented in the PACS or [employer]. Electronically  Signed   By: Reagan Camera M.D.   On: 03/19/2024 06:57    CLINICAL DATA:  Interstitial lung disease, pleural effusion.   EXAM: CT CHEST WITHOUT CONTRAST   TECHNIQUE: Multidetector CT imaging of the chest was performed following the standard protocol without intravenous contrast. High resolution imaging of the lungs, as well as inspiratory and expiratory imaging, was performed.   RADIATION DOSE REDUCTION: This exam was performed according to the departmental dose-optimization program which includes automated exposure control, adjustment of the mA and/or kV according to patient size and/or use of iterative reconstruction technique.   COMPARISON:  02/04/2024 and PET 11/27/2022.   FINDINGS: Cardiovascular: Atherosclerotic calcification of the aorta, aortic valve and coronary arteries. Enlarged pulmonic trunk and heart. No pericardial effusion.   Mediastinum/Nodes: Thoracic inlet lymph nodes are not enlarged by CT size criteria. Mediastinal lymph nodes measure up to 11 mm in the low right paratracheal station and are likely reactive in etiology. Hilar regions are difficult to definitively evaluate without IV contrast. No axillary adenopathy. Esophagus is grossly unremarkable.   Lungs/Pleura: Large partially loculated right pleural effusion with collapse/consolidation in the right middle and right lower lobes, slightly improved from 02/04/2024. Patchy coarsened ground-glass, interlobular and interlobular septal thickening and traction bronchiectasis. Calcified granulomas. Trace left pleural effusion. Airway is unremarkable. No air trapping.   Upper Abdomen: Visualized portions of the liver, adrenal glands, left kidney, spleen, pancreas, stomach and bowel are grossly unremarkable. No upper abdominal adenopathy.   Musculoskeletal: Degenerative changes in the spine. Flowing anterior osteophytosis in the thoracic spine.   IMPRESSION: 1. Large partially loculated right pleural  effusion with collapse/consolidation in the right middle and right lower lobes, slightly improved from 02/04/2024. 2. Patchy coarsened pulmonary parenchymal ground-glass with interlobular and intralobular septal thickening, similar to 11/27/2022. Pattern of interstitial  lung disease may be due to fibrotic nonspecific interstitial pneumonitis. In the absence of air trapping, fibrotic hypersensitivity pneumonitis is considered less likely. Findings are suggestive of an alternative diagnosis (not UIP) per consensus guidelines: Diagnosis of Idiopathic Pulmonary Fibrosis: An Official ATS/ERS/JRS/ALAT Clinical Practice Guideline. Am Annie Barton Crit Care Med Vol 198, Iss 5, [phone], Jun 15 2017. 3. Trace left pleural effusion. 4. Aortic atherosclerosis (ICD10-I70.0). Coronary artery calcification. 5. Enlarged pulmonic trunk, indicative of pulmonary arterial hypertension.     Electronically Signed   By: Shearon Denis M.D.   On: 03/12/2024 15:36      I have independently reviewed the above radiologic studies and discussed with the patient   Recent Lab Findings: Lab Results  Component Value Date   WBC 6.2 03/20/2024   HGB 12.6 (L) 03/20/2024   HCT 40.9 03/20/2024   PLT 143 (L) 03/20/2024   GLUCOSE 101 (H) 03/20/2024   ALT 17 03/17/2024   AST 16 03/17/2024   NA 137 03/20/2024   K 4.0 03/20/2024   CL 102 03/20/2024   CREATININE 1.32 (H) 03/20/2024   BUN 42 (H) 03/20/2024   CO2 28 03/20/2024   TSH 27.131 (H) 03/17/2024   INR 1.2 07/11/2023      Assessment / Plan:   Very pleasant 83 year old gentleman with multiple medical problems including 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  He was admitted after developing an  third large pleural effusion within about 8 weeks with associated hypoxia.  Fluid cytologies have been negative thus far.  CT scan of the chest on 03/12/2024 showed "patchy coarse and groundglass, interlobular septal thickening and traction bronchiectasis" consistent with interstitial lung disease.  Repeat high-resolution CT chest has been ordered and is pending at this time.  An echocardiogram has also just been completed but not yet resulted. Pleural biopsy may yield some diagnostic value but Mr. Dershem is a poor candidate for any surgical procedure.  Investigation by the primary team is ongoing.  We will follow.  He would likely benefit from placement of a Pleurx catheter at some point before discharge. Dr. Deloise Ferries will review and further recommendations will follow.    I  spent 25 minutes counseling the patient face to face.   Leata Providence, PA-C  03/20/2024 3:17 PM   Agree with above 82yo male with left pleural effusion.  He has multiple comorbidities and would be very high risk for surgery.  Would recommend pleurx catheter placement as pleural biopsy would likely not change is treatment course given his functional state and medical problems.  Skyeler Smola [place]

## 2024-03-20 NOTE — Progress Notes (Signed)
  Echocardiogram attempted. 2x.  Patient in bathroom for 20 minutes.  Attempted later.  Attempted 2nd time and patient not able to have test at this time per RN.   Sabrie Moritz 03/20/2024, 10:28 AM

## 2024-03-20 NOTE — [place] FL2 (Signed)
 [place]  MEDICAID FL2 LEVEL OF CARE FORM     IDENTIFICATION  Patient Name: Samuel Porter Birthdate: [birth year] Sex: male Admission Date (Current Location): 03/16/2024  [facility] and [place] Number:  Producer, television/film/video and Address:  [facility], [address] [place], [place] [address]      Provider Number: [id number]  Attending Physician Name and Address:  Cala Castleman, MD  Relative Name and Phone Number:       Current Level of Care: Hospital Recommended Level of Care: Skilled Nursing Facility Prior Approval Number:    Date Approved/Denied:   PASRR Number: [id number]  Discharge Plan: SNF    Current Diagnoses: Patient Active Problem List   Diagnosis Date Noted   ILD (interstitial lung disease) (HCC) 03/20/2024   Hypervolemia 03/18/2024   Hypoalbuminemia 03/18/2024   Acute on chronic combined systolic and diastolic heart failure (HCC) 03/18/2024   Persistent atrial fibrillation (HCC) 03/18/2024   Pleural effusion on right 03/16/2024   CHF (congestive heart failure) (HCC) 03/16/2024   Acute respiratory failure (HCC) 03/16/2024   Recurrent right pleural effusion 03/16/2024   Pleural effusion 03/16/2024   Encounter for monitoring amiodarone  therapy 01/23/2024   Paroxysmal atrial fibrillation (HCC) 01/08/2024   Hypercoagulable state due to paroxysmal atrial fibrillation (HCC) 01/08/2024   Incarcerated umbilical hernia 07/11/2023   Squamous cell carcinoma of skin 11/30/2022   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   OA (osteoarthritis) of hip 10/31/2016    Orientation RESPIRATION BLADDER Height & Weight     Self, Time, Situation, Place  O2 (2L nasal cannula) Continent Weight: 166 lb 3.6 oz (75.4 kg) Height:  6\' 1"  (185.4 cm)  BEHAVIORAL SYMPTOMS/MOOD NEUROLOGICAL BOWEL NUTRITION STATUS      Continent Diet (See dc summary)  AMBULATORY STATUS COMMUNICATION OF NEEDS Skin   Limited Assist Verbally Skin  abrasions (skin tear on sternum)                       Personal Care Assistance Level of Assistance  Bathing, Feeding, Dressing Bathing Assistance: Limited assistance Feeding assistance: Limited assistance Dressing Assistance: Limited assistance     Functional Limitations Info  Sight, Hearing Sight Info: Impaired Hearing Info: Impaired      SPECIAL CARE FACTORS FREQUENCY  PT (By licensed PT), OT (By licensed OT)     PT Frequency: 5x/week OT Frequency: 5x/week            Contractures Contractures Info: Not present    Additional Factors Info  Code Status, Allergies Code Status Info: Full Allergies Info: NKA           Current Medications (03/20/2024):  This is the current hospital active medication list Current Facility-Administered Medications  Medication Dose Route Frequency Provider Last Rate Last Admin   (feeding supplement) PROSource Plus liquid 30 mL  30 mL Oral BID BM Singh, Prashant K, MD   30 mL at 03/20/24 0844   acetaminophen  (TYLENOL ) tablet 650 mg  650 mg Oral Q6H PRN Sheikh, Omair Latif, DO       Or   acetaminophen  (TYLENOL ) suppository 650 mg  650 mg Rectal Q6H PRN Sheikh, Omair Latif, DO       albuterol  (PROVENTIL ) (2.5 MG/3ML) 0.083% nebulizer solution 3 mL  3 mL Inhalation Q6H PRN Singh, Prashant K, MD   3 mL at 03/18/24 1157   amiodarone  (NEXTERONE  PREMIX) 360-4.14 MG/200ML-% (1.8 mg/mL) IV infusion  30 mg/hr Intravenous Continuous Veryl Gottron,  Bridgette, MD 16.67 mL/hr at 03/20/24 0431 30 mg/hr at 03/20/24 0431   [START ON 03/21/2024] amiodarone  (PACERONE ) tablet 200 mg  200 mg Oral Daily Sheryle Donning, MD       apixaban Herby Lolling) tablet 5 mg  5 mg Oral BID Singh, Prashant K, MD   5 mg at 03/20/24 0844   bisacodyl  (DULCOLAX) suppository 10 mg  10 mg Rectal Daily PRN Sheikh, Omair Latif, DO       cholecalciferol  (VITAMIN D3) 25 MCG (1000 UNIT) tablet 1,000 Units  1,000 Units Oral Daily Sheikh, Omair Latif, DO   1,000 Units at 03/20/24 7829    docusate sodium  (COLACE) capsule 100 mg  100 mg Oral BID Sheikh, Omair Latif, DO   100 mg at 03/20/24 0845   dorzolamide -timolol  (COSOPT ) 2-0.5 % ophthalmic solution 1 drop  1 drop Both Eyes BID Sheikh, Omair Carbon, DO   1 drop at 03/20/24 0900   hydrALAZINE (APRESOLINE) injection 10 mg  10 mg Intravenous Q6H PRN Sheikh, Omair Latif, DO       levothyroxine (SYNTHROID) tablet 50 mcg  50 mcg Oral Q0600 Singh, Prashant K, MD   50 mcg at 03/20/24 5621   metoprolol  succinate (TOPROL -XL) 24 hr tablet 25 mg  25 mg Oral QPM Williams, Evan, PA-C   25 mg at 03/19/24 1844   midodrine (PROAMATINE) tablet 10 mg  10 mg Oral TID WC Clem Currier, DO   10 mg at 03/20/24 0844   ondansetron  (ZOFRAN ) tablet 4 mg  4 mg Oral Q6H PRN Sheikh, Omair Latif, DO       Or   ondansetron  (ZOFRAN ) injection 4 mg  4 mg Intravenous Q6H PRN Sheikh, Omair Latif, DO       oxyCODONE  (Oxy IR/ROXICODONE ) immediate release tablet 5 mg  5 mg Oral Q4H PRN Sheikh, Omair Latif, DO       polyethylene glycol (MIRALAX  / GLYCOLAX ) packet 17 g  17 g Oral Daily PRN Sheikh, Omair Latif, DO   17 g at 03/19/24 1847     Discharge Medications: Please see discharge summary for a list of discharge medications.  Relevant Imaging Results:  Relevant Lab Results:   Additional Information SSn: [id number] [place], [place]

## 2024-03-20 NOTE — Plan of Care (Signed)
  Problem: Health Behavior/Discharge Planning: Goal: Ability to manage health-related needs will improve Outcome: Progressing   Problem: Clinical Measurements: Goal: Ability to maintain clinical measurements within normal limits will improve Outcome: Progressing Goal: Will remain free from infection Outcome: Progressing Goal: Diagnostic test results will improve Outcome: Progressing Goal: Respiratory complications will improve Outcome: Progressing Goal: Cardiovascular complication will be avoided Outcome: Progressing   Problem: Activity: Goal: Risk for activity intolerance will decrease Outcome: Progressing   Problem: Nutrition: Goal: Adequate nutrition will be maintained Outcome: Progressing   Problem: Coping: Goal: Level of anxiety will decrease Outcome: Progressing   Problem: Elimination: Goal: Will not experience complications related to bowel motility Outcome: Progressing Goal: Will not experience complications related to urinary retention Outcome: Progressing   Problem: Pain Managment: Goal: General experience of comfort will improve and/or be controlled Outcome: Progressing   Problem: Safety: Goal: Ability to remain free from injury will improve Outcome: Progressing   Problem: Skin Integrity: Goal: Risk for impaired skin integrity will decrease Outcome: Progressing   Problem: Education: Goal: Ability to demonstrate management of disease process will improve Outcome: Progressing Goal: Ability to verbalize understanding of medication therapies will improve Outcome: Progressing Goal: Individualized Educational Video(s) Outcome: Progressing   Problem: Activity: Goal: Capacity to carry out activities will improve Outcome: Progressing   Problem: Cardiac: Goal: Ability to achieve and maintain adequate cardiopulmonary perfusion will improve Outcome: Progressing   Problem: Education: Goal: Knowledge of disease or condition will improve Outcome:  Progressing Goal: Understanding of medication regimen will improve Outcome: Progressing Goal: Individualized Educational Video(s) Outcome: Progressing   Problem: Activity: Goal: Ability to tolerate increased activity will improve Outcome: Progressing   Problem: Cardiac: Goal: Ability to achieve and maintain adequate cardiopulmonary perfusion will improve Outcome: Progressing   Problem: Health Behavior/Discharge Planning: Goal: Ability to safely manage health-related needs after discharge will improve Outcome: Progressing

## 2024-03-20 NOTE — Progress Notes (Signed)
*  PRELIMINARY RESULTS* Echocardiogram 2D Echocardiogram has been performed.  [name] 03/20/2024, 3:33 PM

## 2024-03-20 NOTE — Progress Notes (Signed)
 PROGRESS NOTE                                                                                                                                                                                                             Patient Demographics:    Samuel Porter, is a 83 y.o. male, DOB - [birth year], QIH:[id number]  Outpatient Primary MD for the patient is [name], MD    LOS - 4  Admit date - 03/16/2024    Chief Complaint  Patient presents with   Shortness of Breath       Brief Narrative (HPI from H&P)   83 y.o. male with medical history significant for but not limited to BPH, glaucoma, hypertension, vitamin D  deficiency, atrial fibrillation on anticoagulation with recent cardioversion about 2 weeks ago, history of pleural effusions and other comorbidities who presents with worsening shortness of breath last 3 days.  Patient states that he started developing lower extremity edema that progressively got worse as well as worsening shortness of breath that started 3 days ago.  He states that he was at home and is monitoring his O2 saturations and his O2 saturation for 3 days were about 95% on room air and subsequently continued to drop over the 3 days.  Patient went to go see his pulmonologist today and is found to be hypoxic on room air so they directly admitted him to hospital for further evaluation.   In the ER workup consistent with A-fib RVR, acute on chronic diastolic CHF, recurrent pleural effusion right more than left, he also had hypoxic respiratory failure and was admitted to the hospital.     Subjective:   Patient in bed, appears comfortable, denies any headache, no fever, no chest pain or pressure, no shortness of breath , no abdominal pain. No focal weakness.   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   Patient currently on 3 to 4 L nasal cannula oxygen, pulmonary on board underwent right-sided chest tube placement on 03/18/2024, has lung entrapment syndrome which appears like a pneumothorax but it is not, pleural fluid LDH is higher than serum suggesting this likely is exudative collection, follow pleural fluid cytology initial  cytology does not show any malignant cells, continue supplemental oxygen, up in chair with I-S and flutter valve for pulmonary toiletry, for management of pulmonary issues to PCCM team.  Home Eliquis resumed 03/19/2024.  Paroxysmal A-fib and RVR, acute on chronic systolic heart failure EF 35%.  Currently on amiodarone  drip for rate control, Eliquis as above, cardiology consulted, will defer cardiac issues to the cardiology team.  Monitor on telemetry.  Low-dose beta-blocker if tolerated by blood pressure, blood pressure and renal function precludes the use of ACE/ARB or Entresto.  Repeat limited echo ordered by cardiology pending.  Recurrent effusion right more than left.  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  Elevated troponin: Mildly flattened in setting of A-fib with RVR and acute on chronic CHF.  And is flat, cardiology on board.  Chest pain-free.   AKI.  Nephrology consulted.  Will defer diuretic management to nephrology.  Glaucoma: Continue home eyedrops.   BPH: Currently holding his home finasteride    Essential hypertension/currently hypotensive: Continue blood pressure low, hold beta-blocker, midodrine  added, albumin, Prosource, as needed IV hydralazine, monitor.  TSH is extremely high started on Synthroid, will random a.m. cortisol.   Hypoalbuminemia: Patient's Albumin Level is now 2.6. CTM and Trend and repeat CMP in the AM, placed on Prosource.   Hypothyroidism.  TSH greater than 25 this was the day after admission, free T4 0.71, low-dose Synthroid and monitor.  Now amiodarone  has been added hands outpatient should be monitored by endocrinologist.       Condition - Extremely Guarded  Family Communication  : Daughter bedside 03/17/2024, daughter over the phone 03/19/2024, son bedside 03/20/2024  Code Status : Full code  Consults  : Pulmonary, cardiology, nephrology  PUD Prophylaxis :     Procedures  :     Right-sided chest tube placement by pulmonary critical care on 03/18/2024      Disposition Plan  :    Status is: Inpatient   DVT Prophylaxis  :    Place TED hose Start: 03/17/24 1010 SCDs Start: 03/16/24 1742 apixaban (ELIQUIS) tablet 5 mg   Lab Results  Component Value Date   PLT 143 (L) 03/20/2024    Diet :  Diet Order             Diet Heart Room service appropriate? Yes; Fluid consistency: Thin; Fluid restriction: 1500 mL Fluid  Diet effective now                    Inpatient Medications  Scheduled Meds:  (feeding supplement) PROSource Plus  30 mL Oral BID BM   apixaban  5 mg Oral BID   cholecalciferol   1,000 Units Oral Daily   docusate sodium   100 mg Oral BID   dorzolamide -timolol   1 drop Both Eyes BID   levothyroxine  50 mcg Oral Q0600   metoprolol  succinate  25 mg Oral QPM   midodrine  10 mg Oral TID WC   Continuous Infusions:  amiodarone  30 mg/hr (03/20/24 0431)   PRN Meds:.acetaminophen  **OR** acetaminophen , albuterol , bisacodyl , hydrALAZINE, ondansetron  **OR** ondansetron  (ZOFRAN ) IV, oxyCODONE , polyethylene glycol  Antibiotics  :    Anti-infectives (From admission, onward)    None         Objective:   Vitals:   03/19/24 1300  03/19/24 1956 03/20/24 0000 03/20/24 0427  BP: 100/60 103/81 100/68 91/65  Pulse: 87 77 71 71  Resp: (!) 22 19 18 18   Temp:  97.7 F (36.5 C) 97.7 F (36.5 C) 97.7 F (36.5 C)  TempSrc:  Oral Oral Oral  SpO2: 96% 93% 100% 98%  Weight:      Height:        Wt Readings from Last 3 Encounters:  03/19/24 75.4 kg  03/16/24 75.6 kg  03/05/24 73 kg     Intake/Output Summary (Last 24 hours) at 03/20/2024 0837 Last data filed at 03/20/2024 0300 Gross per 24 hour  Intake 478.35 ml  Output 1450 ml  Net -971.65 ml     Physical Exam  Awake Alert, No new F.N deficits, Normal affect [place].AT,PERRAL Supple Neck, No JVD,   Symmetrical Chest wall movement, Good air movement bilaterally, few crackles, right-sided chest tube in place RRR,No Gallops,Rubs or new Murmurs,  +ve B.Sounds, Abd Soft, No tenderness,   Trace edema     Data Review:    Recent Labs  Lab 03/16/24 1923 03/17/24 0338 03/18/24 0332 03/19/24 0427 03/20/24 0614  WBC 7.8 6.2 6.4 6.7 6.2  HGB 13.6 12.5* 12.2* 12.4* 12.6*  HCT 43.8 39.7 39.7 40.3 40.9  PLT 213 178 174 165 143*  MCV 94.0 93.2 94.3 95.5 93.8  MCH 29.2 29.3 29.0 29.4 28.9  MCHC 31.1 31.5 30.7 30.8 30.8  RDW 14.4 14.5 14.4 14.3 14.5  LYMPHSABS 0.6* 0.5* 0.5* 0.5* 0.4*  MONOABS 1.0 0.9 1.1* 1.0 0.9  EOSABS 0.0 0.0 0.1 0.1 0.1  BASOSABS 0.0 0.0 0.0 0.0 0.0    Recent Labs  Lab 03/16/24 1923 03/17/24 0338 03/18/24 0332 03/19/24 0427 03/20/24 0614  NA 139 138 137 135 137  K 3.7 3.5 3.5 3.4* 4.0  CL 102 103 101 101 102  CO2 27 30 29 28 28   ANIONGAP 10 5 7 6 7   GLUCOSE 123* 91 101* 112* 101*  BUN 33* 38* 48* 42* 42*  CREATININE 1.27* 1.58* 1.34* 1.34* 1.32*  AST 21 16  --   --   --   ALT 19 17  --   --   --   ALKPHOS 100 83  --   --   --   BILITOT 1.1 0.8  --   --   --   ALBUMIN 2.6* 2.3*  --   --   --   CRP  --   --  3.9* 3.5* 3.9*  PROCALCITON  --   --  <0.10 <0.10  --   TSH  --  27.131*  --   --   --   BNP 1,203.9*  --  1,029.3*  1,086.3* 1,050.7*  MG 2.3 2.2 2.2 2.1 2.1  PHOS 4.4 4.8* 4.5 3.3 2.9  CALCIUM 8.6* 8.3* 8.7* 8.5* 8.7*      Recent Labs  Lab 03/16/24 1923 03/17/24 0338 03/18/24 0332 03/19/24 0427 03/20/24 0614  CRP  --   --  3.9* 3.5* 3.9*  PROCALCITON  --   --  <0.10 <0.10  --   TSH  --  27.131*  --   --   --   BNP 1,203.9*  --  1,029.3* 1,086.3* 1,050.7*  MG 2.3 2.2 2.2 2.1 2.1  CALCIUM 8.6* 8.3* 8.7* 8.5* 8.7*    ---------------------------------------------------------------------------------------------------------------  No results found for: "CHOL", "HDL", "LDLCALC", "LDLDIRECT", "TRIG", "CHOLHDL"  No results found for: "HGBA1C" No results for input(s): "TSH", "T4TOTAL", "FREET4", "T3FREE", "THYROIDAB" in the last 72 hours.  No results for input(s): "VITAMINB12", "FOLATE", "FERRITIN", "TIBC", "IRON", "RETICCTPCT" in the last 72 hours. ------------------------------------------------------------------------------------------------------------------ Cardiac Enzymes No results for input(s): "CKMB", "TROPONINI", "MYOGLOBIN" in the last 168 hours.  Invalid input(s): "CK"  Micro Results No results found for this or any previous visit (from the past 240 hours).  Radiology Report DG CHEST PORT 1 VIEW Result Date: 03/20/2024 CLINICAL DATA:  Chest tube in place. EXAM: PORTABLE CHEST 1 VIEW COMPARISON:  March 19, 2024. FINDINGS: Stable cardiomediastinal silhouette. Mild central pulmonary vascular congestion is noted with possible bilateral pulmonary edema. Stable right-sided chest tube with stable small right hydropneumothorax. Bony thorax is unremarkable. IMPRESSION: Stable right-sided chest tube with stable small right hydropneumothorax. Mild central pulmonary vascular congestion with possible bilateral pulmonary edema. Electronically Signed   By: Rosalene Colon M.D.   On: 03/20/2024 08:22   DG CHEST PORT 1 VIEW Result Date: 03/19/2024 CLINICAL DATA:  [id number] Chest tube in place [id number]. EXAM:  PORTABLE CHEST 1 VIEW COMPARISON:  03/19/2024, 6:31 a.m. FINDINGS: There is small residual right pneumothorax, decreased since the recent prior study, status post presumed pleural drainage catheter exchange. Redemonstration of chronic interstitial and alveolar opacities throughout bilateral lungs without significant interval change. No left pneumothorax. There is blunting of bilateral lateral costophrenic angles, concerning for underlying bilateral trace pleural effusions. Stable cardio-mediastinal silhouette. No acute osseous abnormalities. The soft tissues are within normal limits. IMPRESSION: 1. Small residual right pneumothorax, decreased since the recent prior study, status post presumed pleural drainage catheter exchange. 2. Redemonstration of chronic interstitial and alveolar opacities throughout bilateral lungs without significant interval change. Electronically Signed   By: Beula Brunswick M.D.   On: 03/19/2024 11:36   DG Chest Port 1 View Result Date: 03/19/2024 CLINICAL DATA:  Shortness of breath EXAM: PORTABLE CHEST 1 VIEW COMPARISON:  Chest x-ray performed March 18, 2024 FINDINGS: A right basilar pneumothorax is present which extends along the periphery into the right apex. The basilar component has increased. The right basilar pigtail catheter is possibly kinked. Interstitial airspace opacities are present in both lungs, similar. Heart remains enlarged. IMPRESSION: 1. The right basilar pigtail catheter is possibly kinked. 2. Modest worsening of right basilar component of pneumothorax. These results will be called to the ordering clinician or representative by the Radiologist Assistant, and communication documented in the PACS or [employer]. Electronically Signed   By: Reagan Camera M.D.   On: 03/19/2024 06:57   DG Chest Port 1 View Result Date: 03/18/2024 CLINICAL DATA:  Right chest tube placement for a large right pleural effusion. EXAM: PORTABLE CHEST 1 VIEW COMPARISON:  03/17/2024 FINDINGS:  Interval pigtail pleural catheter at the right lung base with resolution of the previously demonstrated right pleural effusion. This has been replaced with an approximately 35% right pneumothorax without mediastinal shift. The cardiac silhouette is borderline enlarged. Patchy density is again demonstrated at the left lung base there is some patchy and linear density in the partially re-expanded right lung. Stable mild chronic interstitial prominence. Thoracic spine degenerative changes. IMPRESSION: 1. Approximately 35% right pneumothorax without mediastinal shift. 2. Interval pigtail pleural catheter placement at the right lung base with resolution of the previously demonstrated right pleural effusion. 3. Patchy and linear density in the partially re-expanded right lung, likely due to atelectasis with possible pneumonia/re-expansion pulmonary edema. 4. Stable patchy density  at the left lung base suspicious for pneumonia. These results will be called to the ordering clinician or representative by the Radiologist Assistant, and communication documented in the PACS or [employer]. Electronically Signed   By: Catherin Closs M.D.   On: 03/18/2024 15:01     Signature  -   Lynnwood Sauer M.D on 03/20/2024 at 8:37 AM   -  To page go to [email or web address]

## 2024-03-20 NOTE — TOC Progression Note (Signed)
 Transition of Care [facility]) - Progression Note    Patient Details  Name: Samuel Porter MRN: [id number] Date of Birth: 1941-05-01  Transition of [facility]) CM/SW Contact  Jannice Mends, LCSW Phone Number: 03/20/2024, 3:03 PM  Clinical Narrative:    Per MD, family hoping for SNF for patient. CSW will continue to follow once chest tube is removed to determine if patient meets SNF insurance criteria.      Barriers to Discharge: Continued Medical Work up, Conservator, museum/gallery and Services                                               Social Determinants of Health (SDOH) Interventions SDOH Screenings   Food Insecurity: Patient Declined (03/17/2024)  Housing: Low Risk  (11/30/2022)  Transportation Needs: No Transportation Needs (02/04/2024)   [facility] from [facility]  Utilities: Low Risk  (02/04/2024)   Received from [facility]  Depression (PHQ2-9): Low Risk  (11/30/2022)  Tobacco Use: Medium Risk (03/18/2024)    Readmission Risk Interventions     No data to display

## 2024-03-20 NOTE — Progress Notes (Addendum)
 NAME:  Samuel Porter, MRN:  [id number], DOB:  Jul 01, 1941, LOS: 4 ADMISSION DATE:  03/16/2024, CONSULTATION DATE:  03/20/24 REFERRING MD:  TRH, CHIEF COMPLAINT:  DOE   History of Present Illness:  83 year old man history of CHF presented from pulmonary clinic with worsening dyspnea exertion, hypoxemia, lower extremity swelling, and A-fib with RVR concerning for CHF exacerbation with recurrent right greater than left pleural effusion.  Patient about 2 weeks ago had a cardioversion for A-fib.  Presented to pulm clinic today with worsening shortness of breath for few days.  Associated with worsening lower extremity swelling.  He was unaware he was back in A-fib.  Noted to be progressive hypoxemic 89% yesterday.  82% on room air today in clinic.  Chest x-ray obtained showed reaccumulation, worsening right-sided pleural effusion compared to May on my review interpretation.  He states when he is had thoracentesis in the past that does improve his symptoms.  He admits to weight gain and lower extremity swelling as discussed above.  He had thoracentesis at [facility] 4/24 -2.2 L of fluid removed Prior fluid studies are borderline but appear exudative.  Lymphocyte predominance is peculiar and unexpected..  Labs today with markedly elevated pro BNP.  Pertinent  Medical History  CHF with reduced EF A-fib with RVR regionally recurrent skin cancer to the neck s/p neoadjuvant cemipilumab with great response   s/p right modified radical neck dissection levels 1-4 and left neck level 1b lymph node biopsy on 04/15/23 - pathology revealed a path CR.   Significant Hospital Events: Including procedures, antibiotic start and stop dates in addition to other pertinent events     Interim History / Subjective:   Denies dyspnea or chest pain Afebrile 750 cc chest tube output documented   Objective    Blood pressure 129/77, pulse 75, temperature (!) 97.4 F (36.3 C), temperature source Oral, resp. rate (!) 23,  height 6\' 1"  (1.854 m), weight 75.4 kg, SpO2 94%.        Intake/Output Summary (Last 24 hours) at 03/20/2024 1405 Last data filed at 03/20/2024 1300 Gross per 24 hour  Intake 478.35 ml  Output 1150 ml  Net -671.65 ml   Filed Weights   03/16/24 1146 03/18/24 0500 03/19/24 0500  Weight: 75.3 kg 77.8 kg 75.4 kg    Examination: General: Frail elderly gentleman lying in stretcher HENT: Atraumatic normocephalic temporal wasting noted Lungs: Decreased breath sounds on right, no accessory muscle use Cardiovascular: Tachycardic, irregularly irregular Abdomen: Not distended Extremities: Edema noted Neuro: No focal deficits noted  Labs show normal electrolytes, procalcitonin negative, BNP 1050, no leukocytosis  Resolved problem list   Assessment and Plan   Recurrent right pleural effusion: Initial tap at [facility] 01/2024, tapped 5/25, both borderline exudate with high protein and LDH.   he has cardiomyopathy reduced EF, severely dilated LA, dilate RA.  Small left pleural effusion, bilateral.  Unclear why right is so much larger but certainly it is.  He improves symptomatically with thoracentesis.  High suspicion related to cardiogenic fluid overload, unclear why asymmetric. BNP markedly elevated. 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 Sighs of RV volume overload on exam. -- Chest tube placed 6/4 -- Cytology now negative x 3,  occult malignancy possible, will plan for repeat CT and see if pleural can be imaged better.  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 -Will obtain serology - He is not a candidate for talc pleurodesis due to trapped lung, may be a candidate for mechanical pleurodesis versus Pleurx catheter   Acute hypoxemic respiratory failure: Suspect in the setting of  volume overload, CHF exacerbation given onset of symptoms dyspnea hypoxemia in concurrence with worsening lower extremity swelling.  Weight is up as well. As is BNP. 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  -- Improved, continue chest tube to suction   Detailed discussion with son and daughter at the bedside.   Best Practice (right click and "Reselect all SmartList Selections" daily)   Per primary  Labs   CBC: Recent Labs  Lab 03/16/24 1923 03/17/24 0338 03/18/24 0332 03/19/24 0427 03/20/24 0614  WBC 7.8 6.2 6.4 6.7 6.2  NEUTROABS 6.1 4.8 4.7 5.1 4.8  HGB 13.6 12.5* 12.2* 12.4* 12.6*  HCT 43.8 39.7 39.7 40.3 40.9  MCV 94.0 93.2 94.3 95.5 93.8  PLT 213 178 174 165 143*    Basic Metabolic Panel: Recent Labs  Lab 03/16/24 1923 03/17/24 0338 03/18/24 0332 03/19/24 0427 03/20/24 0614  NA 139 138 137 135 137  K 3.7 3.5 3.5 3.4* 4.0  CL 102 103 101 101 102  CO2 27 30 29 28 28   GLUCOSE 123* 91 101* 112* 101*  BUN 33* 38* 48* 42* 42*  CREATININE 1.27* 1.58* 1.34* 1.34* 1.32*  CALCIUM 8.6* 8.3* 8.7* 8.5* 8.7*  MG 2.3 2.2 2.2 2.1 2.1  PHOS 4.4 4.8* 4.5 3.3 2.9   GFR: Estimated Creatinine Clearance: 46 mL/min (A) (by C-G formula based on SCr of 1.32 mg/dL (H)). Recent Labs  Lab 03/17/24 0338 03/18/24 0332 03/19/24 0427 03/20/24 0614  PROCALCITON  --  <0.10 <0.10 <0.10  WBC 6.2 6.4 6.7 6.2    Liver Function Tests: Recent Labs  Lab 03/16/24 1923 03/17/24 0338  AST 21 16  ALT 19 17  ALKPHOS 100 83  BILITOT 1.1 0.8  PROT 6.6 5.9*  ALBUMIN 2.6* 2.3*   No results for input(s): "LIPASE", "AMYLASE" in the last 168 hours. No results for input(s): "AMMONIA" in the last 168 hours.  ABG    Component Value  Date/Time   HCO3 38.1 (H) 07/11/2023 1659   TCO2 40 (H) 07/11/2023 1659   O2SAT 54 07/11/2023 1659     Coagulation Profile: No results for input(s): "INR", "PROTIME" in the last 168 hours.  Cardiac Enzymes: No results for input(s): "CKTOTAL", "CKMB", "CKMBINDEX", "TROPONINI" in the last 168 hours.  HbA1C: No results found for: "HGBA1C"  CBG: No results for input(s): "GLUCAP" in the last 168 hours.   Jennifer Moellers Villa Greaser, MD [facility] [place]

## 2024-03-20 NOTE — Progress Notes (Signed)
 Rounding Note   Patient Name: Samuel Porter Date of Encounter: 03/20/2024   [facility] Cardiologist: Arnoldo Lapping, MD   Subjective He is feeling better today, he feels like his breathing and volume status has improved. Denies chest pain or lightheadedness/dizziness.   Spoke with daughter via patient's cell phone with patient in the room. She was curious to know what diagnostic testing is being conducted in order to find the etiology of her father's acute condition. She is curious what we have done to fully rule out amyloid, possible side effect of his previous immunotherapy or reoccurrence of malignancy. A recent urine sample from 2 weeks ago at [facility] showed M spikes and elevated globins per daughter, wants to make sure we have access to this lab work.(See Leala Prince PA-C note prior in admission) Patient denies having any concerns or questions.   Scheduled Meds:  (feeding supplement) PROSource Plus  30 mL Oral BID BM   apixaban  5 mg Oral BID   cholecalciferol   1,000 Units Oral Daily   docusate sodium   100 mg Oral BID   dorzolamide -timolol   1 drop Both Eyes BID   levothyroxine  50 mcg Oral Q0600   metoprolol  succinate  25 mg Oral QPM   midodrine  10 mg Oral TID WC   Continuous Infusions:  amiodarone  30 mg/hr (03/20/24 0431)   PRN Meds: acetaminophen  **OR** acetaminophen , albuterol , bisacodyl , hydrALAZINE, ondansetron  **OR** ondansetron  (ZOFRAN ) IV, oxyCODONE , polyethylene glycol   Vital Signs  Vitals:   03/19/24 1956 03/20/24 0000 03/20/24 0427 03/20/24 0800  BP: 103/81 100/68 91/65 116/74  Pulse: 77 71 71 75  Resp: 19 18 18  (!) 23  Temp: 97.7 F (36.5 C) 97.7 F (36.5 C) 97.7 F (36.5 C)   TempSrc: Oral Oral Oral   SpO2: 93% 100% 98% 94%  Weight:      Height:        Intake/Output Summary (Last 24 hours) at 03/20/2024 1233 Last data filed at 03/20/2024 0300 Gross per 24 hour  Intake 478.35 ml  Output 1450 ml  Net -971.65 ml      03/19/2024     5:00 AM 03/18/2024    5:00 AM 03/16/2024   11:46 AM  Last 3 Weights  Weight (lbs) 166 lb 3.6 oz 171 lb 8.3 oz 166 lb  Weight (kg) 75.4 kg 77.8 kg 75.297 kg      Telemetry Atrial Fibrillation with occasional ectopy. HR avg 90s this am - Personally Reviewed  Physical Exam GEN: Fragile, cachetic, sitting on side of bed in no acute distress.   Neck: No JVD Cardiac: Regular rate, irregular rhythm, no murmurs, rubs, or gallops.  Respiratory: Clear to auscultation bilaterally, diminished breath sounds to right lower lobe GI: Soft, nontender, non-distended  MS: No edema; No deformity. Neuro:  Nonfocal  Psych: Normal affect   Labs High Sensitivity Troponin:  No results for input(s): "TROPONINIHS" in the last 720 hours.   Chemistry Recent Labs  Lab 03/16/24 1923 03/17/24 0338 03/18/24 0332 03/19/24 0427 03/20/24 0614  NA 139 138 137 135 137  K 3.7 3.5 3.5 3.4* 4.0  CL 102 103 101 101 102  CO2 27 30 29 28 28   GLUCOSE 123* 91 101* 112* 101*  BUN 33* 38* 48* 42* 42*  CREATININE 1.27* 1.58* 1.34* 1.34* 1.32*  CALCIUM 8.6* 8.3* 8.7* 8.5* 8.7*  MG 2.3 2.2 2.2 2.1 2.1  PROT 6.6 5.9*  --   --   --   ALBUMIN 2.6* 2.3*  --   --   --  AST 21 16  --   --   --   ALT 19 17  --   --   --   ALKPHOS 100 83  --   --   --   BILITOT 1.1 0.8  --   --   --   GFRNONAA 56* 43* 53* 53* 54*  ANIONGAP 10 5 7 6 7     Lipids No results for input(s): "CHOL", "TRIG", "HDL", "LABVLDL", "LDLCALC", "CHOLHDL" in the last 168 hours.  Hematology Recent Labs  Lab 03/18/24 0332 03/19/24 0427 03/20/24 0614  WBC 6.4 6.7 6.2  RBC 4.21* 4.22 4.36  HGB 12.2* 12.4* 12.6*  HCT 39.7 40.3 40.9  MCV 94.3 95.5 93.8  MCH 29.0 29.4 28.9  MCHC 30.7 30.8 30.8  RDW 14.4 14.3 14.5  PLT 174 165 143*   Thyroid   Recent Labs  Lab 03/17/24 0338 03/17/24 0642  TSH 27.131*  --   FREET4  --  0.71    BNP Recent Labs  Lab 03/16/24 1238 03/16/24 1923 03/18/24 0332 03/19/24 0427 03/20/24 0614  BNP  --    < >  1,029.3* 1,086.3* 1,050.7*  PROBNP 7,160.0*  --   --   --   --    < > = values in this interval not displayed.    DDimer No results for input(s): "DDIMER" in the last 168 hours.   Radiology  DG CHEST PORT 1 VIEW Result Date: 03/20/2024 CLINICAL DATA:  Chest tube in place. EXAM: PORTABLE CHEST 1 VIEW COMPARISON:  March 19, 2024. FINDINGS: Stable cardiomediastinal silhouette. Mild central pulmonary vascular congestion is noted with possible bilateral pulmonary edema. Stable right-sided chest tube with stable small right hydropneumothorax. Bony thorax is unremarkable. IMPRESSION: Stable right-sided chest tube with stable small right hydropneumothorax. Mild central pulmonary vascular congestion with possible bilateral pulmonary edema. Electronically Signed   By: Rosalene Colon M.D.   On: 03/20/2024 08:22   DG CHEST PORT 1 VIEW Result Date: 03/19/2024 CLINICAL DATA:  [id number] Chest tube in place [id number]. EXAM: PORTABLE CHEST 1 VIEW COMPARISON:  03/19/2024, 6:31 a.m. FINDINGS: There is small residual right pneumothorax, decreased since the recent prior study, status post presumed pleural drainage catheter exchange. Redemonstration of chronic interstitial and alveolar opacities throughout bilateral lungs without significant interval change. No left pneumothorax. There is blunting of bilateral lateral costophrenic angles, concerning for underlying bilateral trace pleural effusions. Stable cardio-mediastinal silhouette. No acute osseous abnormalities. The soft tissues are within normal limits. IMPRESSION: 1. Small residual right pneumothorax, decreased since the recent prior study, status post presumed pleural drainage catheter exchange. 2. Redemonstration of chronic interstitial and alveolar opacities throughout bilateral lungs without significant interval change. Electronically Signed   By: Beula Brunswick M.D.   On: 03/19/2024 11:36   DG Chest Port 1 View Result Date: 03/19/2024 CLINICAL DATA:  Shortness of breath  EXAM: PORTABLE CHEST 1 VIEW COMPARISON:  Chest x-ray performed March 18, 2024 FINDINGS: A right basilar pneumothorax is present which extends along the periphery into the right apex. The basilar component has increased. The right basilar pigtail catheter is possibly kinked. Interstitial airspace opacities are present in both lungs, similar. Heart remains enlarged. IMPRESSION: 1. The right basilar pigtail catheter is possibly kinked. 2. Modest worsening of right basilar component of pneumothorax. These results will be called to the ordering clinician or representative by the Radiologist Assistant, and communication documented in the PACS or [employer]. Electronically Signed   By: Reagan Camera M.D.   On: 03/19/2024 06:57  DG Chest Port 1 View Result Date: 03/18/2024 CLINICAL DATA:  Right chest tube placement for a large right pleural effusion. EXAM: PORTABLE CHEST 1 VIEW COMPARISON:  03/17/2024 FINDINGS: Interval pigtail pleural catheter at the right lung base with resolution of the previously demonstrated right pleural effusion. This has been replaced with an approximately 35% right pneumothorax without mediastinal shift. The cardiac silhouette is borderline enlarged. Patchy density is again demonstrated at the left lung base there is some patchy and linear density in the partially re-expanded right lung. Stable mild chronic interstitial prominence. Thoracic spine degenerative changes. IMPRESSION: 1. Approximately 35% right pneumothorax without mediastinal shift. 2. Interval pigtail pleural catheter placement at the right lung base with resolution of the previously demonstrated right pleural effusion. 3. Patchy and linear density in the partially re-expanded right lung, likely due to atelectasis with possible pneumonia/re-expansion pulmonary edema. 4. Stable patchy density at the left lung base suspicious for pneumonia. These results will be called to the ordering clinician or representative by the Radiologist  Assistant, and communication documented in the PACS or [employer]. Electronically Signed   By: Catherin Closs M.D.   On: 03/18/2024 15:01    Cardiac Studies Echo pending   Patient Profile   83 y.o. 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    Assessment & Plan   Hypervolemia/third spacing Recurrent pleural effusion status post thoracocentesis Hypoalbuminemia The etiology of his pleural effusion and third spacing is still not completely understood.  CT chest without contrast on Mar 12, 2024 showed patchy coarsened pulmonary parenchymal groundglass with interlobular and intralobular septal thickening this was unchanged from a prior in February 2024.  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 (This predates his start on amiodarone .)  Prior echo showed pleural, pericardial, and ascites fluid with normal right atrial pressure.   His presentation is not typical with a cardiac etiology given that he presents with a mainly unilateral right pleural effusion that is borderline exudative. CRP is elevated with normal white count and procalcitonin. Patient remains afebrile. Cytology results pending.   Nephrology has been following and facilitating diuresis. He has had to receive albumin for diuresis due to lower intravascular volume and low blood pressures. Patient received a right thoracocentesis yesterday, and 2.75 L were drained. He endorses improved breathing. He looks euvolemic on exam  today.   Family expressed concerns over possible amyloid, Dr. Veryl Gottron has spoken with them extensively about low likelihood of this being the possible etiology for this admission. -Awaiting echo today -diuresis deferred to nephrology  Hypotension Last blood pressure today was 116/81.  Patient denies lightheaded or dizziness. Continue midodrine 10 mg 3 times daily. Continue to hold PTA antihypertensives.  Atrial fibrillation New diagnosis as of November 22, 2023.  Placed on amiodarone  initially and then received cardioversion Mar 05, 2024.  Due to his recent cardioversion needs uninterrupted anticoagulation for at least 4 weeks postprocedure.  During admission he was placed on a heparin drip pending his thoracocentesis.  He is now restarted on his home Eliquis.   He is currently in rate controlled atrial  fibrillation.  He denies symptoms.  With his recurrent pleural effusion we will have difficulty achieving rhythm control due to ongoing inflammation irritation of the pericardium.  Due to his low blood pressures cannot use beta-blockers for rate control, would not use calcium channel blockers given reduced EF, and he currently has an AKI this admission avoiding digoxin. - continue Eliquis 5 mg BID -Continue on IV amiodarone  for rate control, see hypothyroidism below as other rate control options are not viable.  -Can consider other rhythm options outpatient once patient is stable  Initially his TSH studies were normal, however his TSH on admission is now 27.131 (See below).  Acute on chronic systolic heart failure Newly diagnosed in March 2025, thought to be tachy-mediated in relation to new diagnosis of atrial fibrillation with RVR.  (See above).  He is currently rate controlled on amiodarone .  Patient looks euvolemic on exam today.  -Repeat echo pending for today -He has been hypotensive during this admission we will need to hold off on GDMT at this time -Diuresis per nephrology (see  above).  Hypothyroidism New elevated TSH of 27 since starting amiodarone .  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 Continue on levothyroxine per primary     For questions or updates, please contact [facility] Please consult [email or web address] for contact info under     Signed, Mabel Savage, PA-C  03/20/2024, 12:33 PM

## 2024-03-20 NOTE — Plan of Care (Signed)
  Problem: Education: Goal: Knowledge of General Education information will improve Description: Including pain rating scale, medication(s)/side effects and non-pharmacologic comfort measures Outcome: Progressing   Problem: Health Behavior/Discharge Planning: Goal: Ability to manage health-related needs will improve Outcome: Progressing   Problem: Clinical Measurements: Goal: Ability to maintain clinical measurements within normal limits will improve Outcome: Progressing Goal: Diagnostic test results will improve Outcome: Progressing Goal: Respiratory complications will improve Outcome: Progressing   Problem: Activity: Goal: Risk for activity intolerance will decrease Outcome: Progressing   

## 2024-03-20 NOTE — Progress Notes (Signed)
 Interval cardiology note:  Reviewed results of echo. Significant changes compared to prior. LVEF 30-35%, global hypokinesis, but right side now markedly different. RV is enlarged, RV function severely reduced, RAP 15 mmHg, RVSP 71. Previously LVEF 35-40%, moderately reduced RV function, RAP 3 mmHg, RVSP 38.   Dr. Villa Greaser and Dr. Zelda Hickman alerted to results.  I returned to speak to the patient about this around 8 PM. We also called his daughter on speakerphone. I discussed the above findings at length, discussed how the veins, right heart, lungs, and left heart are an interconnected circuit. Discussed how the right heart responds to pressure in the lungs. Discussed different etiologies for elevated lung pressures, in this case most concerning for either high right sided pressures due to underlying lung disease vs. High right sided pressures due to high pressure from the left side of the heart. Discussed how right atrial pressure and right ventricular pressure are related to volume status.   Based on the changes on this echo, I discussed with them the need to continue to attempt diuresis, though we know the kidneys need to be considered. Also discussed right heart cath next week, especially either once euvolemic or if diuresis is limited by renal function, to further gather information as to the etiology.   They are interested in being aggressive with diuresis and also pursuing right heart cath. They would like to meet and discuss further with the advanced heart failure team this weekend given his right heart failure.  I've sent a message to the weekend team to ask advanced heart failure to see the patient. I have ordered IV lasix  to start tomorrow AM, will need to adjust dose based on response.  Addressed all questions from patient and daughter to the best of my ability.  Additional 45 minutes of time spent in direct communication with patient and his daughter, communication with clinical team, and  documentation in the EMR.  Sheryle Donning, MD, PhD, [facility] [place]  [facility]  [place]  [facility] at [facility] [address], [address] [place], [place] [address] [phone]

## 2024-03-21 ENCOUNTER — Inpatient Hospital Stay (HOSPITAL_COMMUNITY)

## 2024-03-21 DIAGNOSIS — J9601 Acute respiratory failure with hypoxia: Secondary | ICD-10-CM | POA: Diagnosis not present

## 2024-03-21 DIAGNOSIS — J849 Interstitial pulmonary disease, unspecified: Secondary | ICD-10-CM | POA: Diagnosis not present

## 2024-03-21 DIAGNOSIS — J90 Pleural effusion, not elsewhere classified: Secondary | ICD-10-CM | POA: Diagnosis not present

## 2024-03-21 LAB — CBC WITH DIFFERENTIAL/PLATELET
Abs Immature Granulocytes: 0.06 10*3/uL (ref 0.00–0.07)
Basophils Absolute: 0 10*3/uL (ref 0.0–0.1)
Basophils Relative: 0 %
Eosinophils Absolute: 0.1 10*3/uL (ref 0.0–0.5)
Eosinophils Relative: 2 %
HCT: 42.2 % (ref 39.0–52.0)
Hemoglobin: 12.8 g/dL — ABNORMAL LOW (ref 13.0–17.0)
Immature Granulocytes: 1 %
Lymphocytes Relative: 6 %
Lymphs Abs: 0.5 10*3/uL — ABNORMAL LOW (ref 0.7–4.0)
MCH: 28.8 pg (ref 26.0–34.0)
MCHC: 30.3 g/dL (ref 30.0–36.0)
MCV: 95 fL (ref 80.0–100.0)
Monocytes Absolute: 0.9 10*3/uL (ref 0.1–1.0)
Monocytes Relative: 13 %
Neutro Abs: 5.5 10*3/uL (ref 1.7–7.7)
Neutrophils Relative %: 78 %
Platelets: 166 10*3/uL (ref 150–400)
RBC: 4.44 MIL/uL (ref 4.22–5.81)
RDW: 14.6 % (ref 11.5–15.5)
WBC: 7.1 10*3/uL (ref 4.0–10.5)
nRBC: 0 % (ref 0.0–0.2)

## 2024-03-21 LAB — ANTIEXTRACTABLE NUCLEAR AG
ENA SM Ab Ser-aCnc: <0.2 AI (ref 0.0–0.9)
Ribonucleic Protein: 0.9 AI (ref 0.0–0.9)

## 2024-03-21 LAB — PROCALCITONIN: Procalcitonin: <0.1 ng/mL

## 2024-03-21 LAB — PHOSPHORUS: Phosphorus: 2.7 mg/dL (ref 2.5–4.6)

## 2024-03-21 LAB — BRAIN NATRIURETIC PEPTIDE: B Natriuretic Peptide: 1089.6 pg/mL — ABNORMAL HIGH (ref 0.0–100.0)

## 2024-03-21 LAB — C-REACTIVE PROTEIN: CRP: 4.1 mg/dL — ABNORMAL HIGH (ref <1.0)

## 2024-03-21 MED ORDER — DIGOXIN 125 MCG PO TABS
0.0625 mg | ORAL_TABLET | Freq: Every day | ORAL | Status: DC
  Administered 2024-03-21 – 2024-03-26 (×6): 0.0625 mg via ORAL
  Filled 2024-03-21 (×6): qty 1

## 2024-03-21 MED ORDER — FUROSEMIDE 10 MG/ML IJ SOLN
80.0000 mg | Freq: Once | INTRAMUSCULAR | Status: AC
  Administered 2024-03-21: 80 mg via INTRAVENOUS
  Filled 2024-03-21: qty 8

## 2024-03-21 NOTE — Consult Note (Signed)
 Advanced Heart Failure Team Consult Note   Primary Physician: [name], MD PCP-Cardiologist:  Arnoldo Lapping, MD  Reason for Consultation: Pulmonary hypertension  HPI:    Samuel Porter is a 83 y.o. male with a PMH of squamous cell carcinoma treated with PD-1, HFrEF, afib, HTN who is seen today for evaluation of pulmonary hypertension at the request of Dr. Zelda Hickman.    Patient with a long and complex presentation.  Had been seen at [facility] for squamous cell carcinoma for which he received cemiplimab  and subsequent lymph node removal and surgery.  He did not receive any radiation.  He has been admitted multiple times in the last few months for acute on chronic hypoxic respiratory failure complicated by recurrent right pleural effusion.  He has had multiple liters of fluid drained off, no evidence of metastatic disease, but is continued to have issues with fluid retention, atrial fibrillation, and shortness of breath.  He was hypoxic during a recent clinic appointment so was admitted for recurrent thoracentesis.  He reported that he gets temporary improvement after the fluid is drained, but then it comes back.  He has had multiple attempts at cardioversion and these have unfortunately failed.  He underwent chest tube placement with 2.75 L of output, only overnight.   Discussed case with daughter over the phone.      Objective:    Vital Signs:   Temp:  [97.3 F (36.3 C)-98.2 F (36.8 C)] 97.3 F (36.3 C) (06/07 1529) Pulse Rate:  [73-94] 90 (06/07 1600) Resp:  [18-30] 18 (06/07 1600) BP: (101-117)/(48-93) 101/68 (06/07 1600) SpO2:  [85 %-98 %] 94 % (06/07 1600) Weight:  [76.6 kg] 76.6 kg (06/07 0500) Last BM Date : 03/19/24  Weight change: Filed Weights   03/18/24 0500 03/19/24 0500 03/21/24 0500  Weight: 77.8 kg 75.4 kg 76.6 kg    Intake/Output:   Intake/Output Summary (Last 24 hours) at 03/21/2024 1718 Last data filed at 03/21/2024 1212 Gross per 24 hour  Intake  911.14 ml  Output 1550 ml  Net -638.86 ml      Physical Exam    General: Chronically ill-appearing Cor: Irregular rate and rhythm, JVP elevated to the earlobe, positive V waves, positive HJR, systolic murmur Lungs: Diminished breath sounds on the right, mildly increased work of breathing Abdomen: soft, nontender, nondistended Extremities: no cyanosis, clubbing, rash Neuro: alert & orientedx3, cranial nerves grossly intact. moves all 4 extremities w/o difficulty. Affect pleasant Vascular: 2+ radial pulses   Telemetry   Atrial fibrillation, 80s-90s  Labs and medications reviewed in Epic.  Patient Profile   [name] is a 83 y.o. male with a PMH of squamous cell carcinoma treated with PD-1, HFrEF, afib, HTN who is seen today for evaluation of pulmonary hypertension at the request of Dr. Zelda Hickman.   Assessment/Plan   Pulmonary hypertension: Noted on recent echocardiogram, echo with several concerning features per event right-sided dysfunction including severe TR, worsening RV dilation, abnormal septal flattening that has progressed since March. Etiology unclear. PD-1 can be associated with pulmonary hypertension, but rare. Some evidence of pulmonary disease on CT but disease appears out of proportion to this. LV function diminished, but partly due to abnormal septal motion from RV failure. - Volume up based on JVP, give 80mg  IV lasix  - Stop metoprolol  with RV dysfunction - Poor SGLT-2 candidate currently - Start low dose digoxin 0.0625 for RV support - RHC on Monday - Pending response to diuretics could consider PICC placement for  CVP monitoring and coox measurement - End organ function reasonable, will hold on inotropes  Right pleural effusion: Do suspect some cardiac involvement, but his degree of LV dysfunction seems out of proportion and with predominatly RV failure and no significant MR his persistent effusion is not easily explained. - Continue diuresis as above - RHC  will help assess PCWP  CKD:  - Diuresis as above   Length of Stay: 5  Lauralee Poll, MD  03/21/2024, 5:18 PM  Advanced Heart Failure Team Pager [phone] (M-F; 7a - 5p)  Please contact [facility] for night-coverage after hours (4p -7a ) and weekends on [email or web address]

## 2024-03-21 NOTE — Progress Notes (Signed)
  Progress Note  Patient Name: Samuel Porter Date of Encounter: 03/21/2024 [facility] Cardiologist: Arnoldo Lapping, MD   Echo showed worsening RV function as well as LV function.  Dr. Alease Amend of the advanced heart failure team has been consulted and is planning a right heart catheterization on Monday.         Signed, Ahmad Alert, MD

## 2024-03-21 NOTE — Progress Notes (Signed)
 Physical Therapy Treatment [name] MRN: [id number] DOB: [birth year] Today's Date: 03/21/2024   History of Present Illness 83 year old man presented 6/2 from pulmonary clinic with worsening dyspnea exertion, hypoxemia, lower extremity swelling, and A-fib with RVR concerning for CHF exacerbation with recurrent right greater than left pleural effusion, and AKI.  PMHx includes BPH, glaucoma, hypertension, vitamin D  deficiency, atrial fibrillation on anticoagulation with recent cardioversion ~2 wks ago.    PT Comments  Making progress towards acute functional goals. CGA for transfers and supervision for gait up to 230 feet today with RW for support. No overt instability or LOB noted with this device. SpO2 92-98% on 2L throughout session. Encouraged OOB with staff assist several times per day as tolerated. Patient will continue to benefit from skilled physical therapy services to further improve independence with functional mobility.     If plan is discharge home, recommend the following: A little help with walking and/or transfers;A little help with bathing/dressing/bathroom;Assistance with cooking/housework;Assist for transportation   Can travel by private vehicle        Equipment Recommendations  None recommended by PT    Recommendations for Other Services       Precautions / Restrictions Precautions Precautions: Fall Recall of Precautions/Restrictions: Intact Precaution/Restrictions Comments: chest tube, noisy SpO2 signal on fingers Restrictions Weight Bearing Restrictions Per Provider Order: No     Mobility  Bed Mobility               General bed mobility comments: sitting EOB when PT entered room    Transfers Overall transfer level: Needs assistance Equipment used: Rolling walker (2 wheels) Transfers: Sit to/from Stand Sit to Stand: Contact guard assist           General transfer comment: CGA for safety to stand from edge of bed. Cues  for alignment with LEs and RW touching recliner prior to sitting to maximize safety.    Ambulation/Gait Ambulation/Gait assistance: Supervision Gait Distance (Feet): 230 Feet Assistive device: Rolling walker (2 wheels) Gait Pattern/deviations: Step-through pattern, Decreased stride length, Drifts right/left, Narrow base of support, Knee hyperextension - right Gait velocity: dec Gait velocity interpretation: <1.8 ft/sec, indicate of risk for recurrent falls   General Gait Details: Supervision for safety, gait speed a bit decreased and guarded but without episodes of overt instability or LOB while using RW for support. No appreciable dyspnea, pleth reading absent but quickly picked up a strong signal upon sitting and registered 94-98% while on 2L supplemental O2. Cues for safety and appropriate use of AD with RW.   Stairs             Wheelchair Mobility     Tilt Bed    Modified Rankin (Stroke Patients Only)       Balance Overall balance assessment: Mild deficits observed, not formally tested                                          Communication Communication Communication: No apparent difficulties  Cognition Arousal: Alert Behavior During Therapy: WFL for tasks assessed/performed   PT - Cognitive impairments: No apparent impairments                         Following commands: Intact      Cueing Cueing Techniques: Verbal cues  Exercises      General Comments General comments (  skin integrity, edema, etc.): SpO2 at rest 92% on 2L O2 prior to mobilizing. After session, seated upright in chair SpO2 98% on 2L.      Pertinent Vitals/Pain Pain Assessment Pain Assessment: Faces Faces Pain Scale: Hurts a little bit Pain Location: CT site Pain Descriptors / Indicators: Discomfort Pain Intervention(s): Limited activity within patient's tolerance, Monitored during session    Home Living                          Prior Function             PT Goals (current goals can now be found in the care plan section) Acute Rehab PT Goals Patient Stated Goal: Go home PT Goal Formulation: With patient Time For Goal Achievement: 03/31/24 Potential to Achieve Goals: Good Progress towards PT goals: Progressing toward goals    Frequency    Min 2X/week      PT Plan      Co-evaluation              AM-PAC PT "6 Clicks" Mobility   Outcome Measure  Help needed turning from your back to your side while in a flat bed without using bedrails?: None Help needed moving from lying on your back to sitting on the side of a flat bed without using bedrails?: A Little Help needed moving to and from a bed to a chair (including a wheelchair)?: A Little Help needed standing up from a chair using your arms (e.g., wheelchair or bedside chair)?: A Little Help needed to walk in hospital room?: A Little Help needed climbing 3-5 steps with a railing? : A Little 6 Click Score: 19    End of Session Equipment Utilized During Treatment: Gait belt Activity Tolerance: Patient tolerated treatment well Patient left: with call bell/phone within reach;in chair;with chair alarm set;with SCD's reapplied Nurse Communication: Mobility status PT Visit Diagnosis: Other abnormalities of gait and mobility (R26.89);Muscle weakness (generalized) (M62.81)     Time: 1610-9604 PT Time Calculation (min) (ACUTE ONLY): 29 min  Charges:    $Gait Training: 8-22 mins $Therapeutic Activity: 8-22 mins PT General Charges $$ ACUTE PT VISIT: 1 Visit                     Jory Ng, PT, DPT [facility]  Rehabilitation Services Physical Therapist Office: [phone] Website: [email or web address]    Alinda Irani 03/21/2024, 10:55 AM

## 2024-03-21 NOTE — Plan of Care (Signed)
  Problem: Health Behavior/Discharge Planning: Goal: Ability to manage health-related needs will improve Outcome: Progressing   Problem: Clinical Measurements: Goal: Ability to maintain clinical measurements within normal limits will improve Outcome: Progressing Goal: Will remain free from infection Outcome: Progressing Goal: Diagnostic test results will improve Outcome: Progressing Goal: Respiratory complications will improve Outcome: Progressing Goal: Cardiovascular complication will be avoided Outcome: Progressing   Problem: Activity: Goal: Risk for activity intolerance will decrease Outcome: Progressing   Problem: Nutrition: Goal: Adequate nutrition will be maintained Outcome: Progressing   Problem: Coping: Goal: Level of anxiety will decrease Outcome: Progressing   Problem: Elimination: Goal: Will not experience complications related to bowel motility Outcome: Progressing Goal: Will not experience complications related to urinary retention Outcome: Progressing   Problem: Pain Managment: Goal: General experience of comfort will improve and/or be controlled Outcome: Progressing   Problem: Safety: Goal: Ability to remain free from injury will improve Outcome: Progressing   Problem: Skin Integrity: Goal: Risk for impaired skin integrity will decrease Outcome: Progressing   Problem: Education: Goal: Ability to demonstrate management of disease process will improve Outcome: Progressing Goal: Ability to verbalize understanding of medication therapies will improve Outcome: Progressing Goal: Individualized Educational Video(s) Outcome: Progressing   Problem: Activity: Goal: Capacity to carry out activities will improve Outcome: Progressing   Problem: Cardiac: Goal: Ability to achieve and maintain adequate cardiopulmonary perfusion will improve Outcome: Progressing   Problem: Education: Goal: Knowledge of disease or condition will improve Outcome:  Progressing Goal: Understanding of medication regimen will improve Outcome: Progressing Goal: Individualized Educational Video(s) Outcome: Progressing   Problem: Activity: Goal: Ability to tolerate increased activity will improve Outcome: Progressing   Problem: Cardiac: Goal: Ability to achieve and maintain adequate cardiopulmonary perfusion will improve Outcome: Progressing   Problem: Health Behavior/Discharge Planning: Goal: Ability to safely manage health-related needs after discharge will improve Outcome: Progressing

## 2024-03-21 NOTE — Progress Notes (Signed)
 NAME:  Samuel Porter, MRN:  [id number], DOB:  1941/06/15, LOS: 5 ADMISSION DATE:  03/16/2024, CONSULTATION DATE:  03/21/24 REFERRING MD:  TRH, CHIEF COMPLAINT:  DOE   History of Present Illness:  83 year old man history of CHF presented from pulmonary clinic with worsening dyspnea exertion, hypoxemia, lower extremity swelling, and A-fib with RVR concerning for CHF exacerbation with recurrent right greater than left pleural effusion.  Patient about 2 weeks ago had a cardioversion for A-fib.  Presented to pulm clinic today with worsening shortness of breath for few days.  Associated with worsening lower extremity swelling.  He was unaware he was back in A-fib.  Noted to be progressive hypoxemic 89% yesterday.  82% on room air today in clinic.  Chest x-ray obtained showed reaccumulation, worsening right-sided pleural effusion compared to May on my review interpretation.  He states when he is had thoracentesis in the past that does improve his symptoms.  He admits to weight gain and lower extremity swelling as discussed above.  He had [facility]acent[facility] at [facility] 4/24 -2.2 L of fluid removed Prior fluid studies are borderline but appear exudative.  Lymphocyte predominance is peculiar and unexpected..  Labs today with markedly elevated pro BNP.  Pertinent  Medical History  CHF with reduced EF A-fib with RVR regionally recurrent skin cancer to the neck s/p neoadjuvant cemipilumab with great response   s/p right modified radical neck dissection levels 1-4 and left neck level 1b lymph node biopsy on 04/15/23 - pathology revealed a path CR.   Significant Hospital Events: Including procedures, antibiotic start and stop dates in addition to other pertinent events   6/4 right pigtail placement 6/6 750 cc chest tube output 6/6 echo shows EF 30 to 35%, global hypokinesis, RV SP estimated 71  Interim History / Subjective:   Denies dyspnea or chest pain Out of bed to chair, ambulated on oxygen and maintained  saturation on 2 L Chest tube drained 150 cc  Objective    Blood pressure 101/68, pulse 90, temperature (!) 97.3 F (36.3 C), temperature source Oral, resp. rate 18, height 6\' 1"  (1.854 m), weight 76.6 kg, SpO2 94%.        Intake/Output Summary (Last 24 hours) at 03/21/2024 1615 Last data filed at 03/21/2024 1212 Gross per 24 hour  Intake 911.14 ml  Output 1550 ml  Net -638.86 ml   Filed Weights   03/18/24 0500 03/19/24 0500 03/21/24 0500  Weight: 77.8 kg 75.4 kg 76.6 kg    Examination: General: Frail elderly gentleman lying in stretcher HENT: No JVD temporal wasting noted Lungs: Decreased breath sounds on right, no accessory muscle use Cardiovascular: Tachycardic, irregularly irregular Abdomen: Soft, nontender Extremities: No edema Neuro: No focal deficits noted  Labs show normal electrolytes, procalcitonin negative, BNP 1050, no leukocytosis  HRCT favors fibrotic NSIP, right hydropneumothorax with multiloculated effusion  Resolved problem list   Assessment and Plan   Bilateral pleural effusions with large recurrent right pleural effusion: Initial tap at [facility] 01/[facility]24, then again 5/25, both borderline exudate with high protein and LDH.   he has cardiomyopathy and EF seems much worse than 12/2023 and he has also developed severe pulmonary hypertension   He improves symptomatically with thoracentesis.  High suspicion related to cardiogenic fluid overload, unclear why asymmetric. BNP markedly elevated.  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  Weight is up as well. As is BNP. 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  -- Improved, continue chest tube to suction   Detailed discussion with son and daughter at the bedside. Plan is to proceed with RHC on Monday.  Will take a wait and watch approach   Best Practice (right click and "Reselect all SmartList Selections" daily)   Per primary  Labs   CBC: Recent Labs  Lab 03/17/24 0338 03/18/24 0332 03/19/24 0427 03/20/24 0614 03/21/24 0548  WBC 6.2 6.4 6.7 6.2 7.1  NEUTROABS 4.8 4.7 5.1 4.8 5.5  HGB 12.5* 12.2* 12.4* 12.6* 12.8*  HCT 39.7 39.7 40.3 40.9 42.2  MCV 93.2 94.3 95.5 93.8 95.0  PLT 178 174 165 143* 166    Basic Metabolic Panel: Recent Labs  Lab 03/16/24 1923 03/17/24 0338 03/18/24 0332 03/19/24 0427 03/20/24 0614 03/21/24 0548  NA 139 138 137 135 137  --   K 3.7 3.5 3.5 3.4* 4.0  --   CL 102 103 101 101 102  --   CO2 27 30 29 28 28   --   GLUCOSE 123* 91 101* 112* 101*  --   BUN 33* 38* 48* 42* 42*  --   CREATININE 1.27* 1.58* 1.34* 1.34* 1.32*  --   CALCIUM 8.6* 8.3* 8.7* 8.5* 8.7*  --   MG 2.3 2.2 2.2 2.1 2.1  --   PHOS 4.4 4.8* 4.5 3.3 2.9 2.7   GFR: Estimated Creatinine Clearance: 46.7 mL/min (A) (by C-G formula based on SCr of 1.32 mg/dL (H)). Recent Labs  Lab 03/18/24 0332 03/19/24 0427 03/20/24 0614 03/21/24 0548  PROCALCITON <0.10 <0.10 <0.10 <0.10  WBC 6.4 6.7 6.2 7.1    Liver Function Tests: Recent Labs  Lab  03/16/24 1923 03/17/24 0338  AST 21 16  ALT 19 17  ALKPHOS 100 83  BILITOT 1.1 0.8  PROT 6.6 5.9*  ALBUMIN  2.6* 2.3*   No results for input(s): "LIPASE", "AMYLASE" in the last 168 hours. No results for input(s): "AMMONIA" in the last 168 hours.  ABG    Component Value Date/Time   HCO3 38.1 (H) 07/11/2023 1659   TCO2 40 (H) 07/11/2023 1659   O2SAT 54 07/11/2023 1659     Coagulation Profile: No results for input(s): "INR", "PROTIME" in the last 168 hours.  Cardiac Enzymes: No results for input(s): "CKTOTAL", "CKMB", "CKMBINDEX", "TROPONINI" in the last 168 hours.  HbA1C: No results found for: "HGBA1C"  CBG: No results for input(s): "GLUCAP" in the last 168 hours.   Jennifer Moellers Villa Greaser, MD   Celene Coins MD. FCCP. [facility] Pager : [phone]  If no response to pager ,  please call [phone] until 7 pm After 7:00 pm call Elink  [phone]   03/21/2024

## 2024-03-21 NOTE — Progress Notes (Signed)
 PROGRESS NOTE                                                                                                                                                                                                             Patient Demographics:    Samuel Porter, is a 83 y.o. male, DOB - [birth year], ZOX:[id number]  Outpatient Primary MD for the patient is [name], MD    LOS - 5  Admit date - 03/16/2024    Chief Complaint  Patient presents with   Shortness of Breath       Brief Narrative (HPI from H&P)   83 y.o. male with medical history significant for but not limited to BPH, glaucoma, hypertension, vitamin D  deficiency, atrial fibrillation on anticoagulation with recent cardioversion about 2 weeks ago, history of pleural effusions and other comorbidities who presents with worsening shortness of breath last 3 days.  Patient states that he started developing lower extremity edema that progressively got worse as well as worsening shortness of breath that started 3 days ago.  He states that he was at home and is monitoring his O2 saturations and his O2 saturation for 3 days were about 95% on room air and subsequently continued to drop over the 3 days.  Patient went to go see his pulmonologist today and is found to be hypoxic on room air so they directly admitted him to hospital for further evaluation.   In the ER workup consistent with A-fib RVR, acute on chronic diastolic CHF, recurrent pleural effusion right more than left, he also had hypoxic respiratory failure and was admitted to the hospital.     Subjective:   Patient in bed, appears comfortable, denies any headache, no fever, no chest pain or pressure, no shortness of breath , no abdominal pain. No focal weakness.   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   Patient currently on 3 to 4 L nasal cannula oxygen, pulmonary on board underwent right-sided chest tube placement on 03/18/2024, has lung entrapment syndrome which appears like a pneumothorax but it is not, pleural fluid LDH is higher than serum suggesting this likely is exudative collection, follow pleural fluid cytology initial  cytology does not show any malignant cells, case discussed with pulmonary critical care, upon the request cardiothoracic surgery also consulted who do not think patient is a good candidate for biopsy, they recommend considering Pleurx catheter placement.  Will defer this to pulmonary.    Continue supplemental oxygen, up in chair with I-S and flutter valve for pulmonary toiletry, for management of pulmonary issues to PCCM team.  Home Eliquis  resumed 03/19/2024.   Paroxysmal A-fib and RVR, acute on chronic systolic heart failure EF 35%, reduced RV systolic function as well.  Currently on amiodarone  drip for rate control, Eliquis  as above, cardiology consulted, will defer cardiac issues to the cardiology team.  Monitor on telemetry.  Low-dose beta-blocker if tolerated by blood pressure, blood pressure and renal function precludes the use of ACE/ARB or Entresto.  Repeat limited echo done 03/20/2024 noted with low EF of 35% along with elevated reduced RV systolic function and increased RV pressures likely due to underlying pulmonary hypertension.  Recurrent effusion right more than left.  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  Elevated troponin: Mildly flattened in setting of A-fib with RVR and acute on chronic CHF.  And is flat, cardiology on board.  Chest pain-free.   AKI.  Nephrology consulted.  Will defer diuretic management to nephrology.  Glaucoma: Continue home eyedrops.   BPH: Currently holding his home finasteride    Essential hypertension/currently hypotensive: Continue blood pressure low, hold beta-blocker, midodrine  added, albumin , Prosource, as needed IV hydralazine , monitor.  TSH is extremely high started on Synthroid , will random a.m. cortisol.   Hypoalbuminemia: Patient's Albumin  Level is now 2.6. CTM and Trend and repeat CMP in the AM, placed on Prosource.   Hypothyroidism.  TSH greater than 25 this was the day after admission, free T4 0.71, low-dose Synthroid  and monitor.  Now amiodarone  has been added hands outpatient should be monitored by endocrinologist.       Condition - Extremely Guarded  Family Communication  : Daughter bedside 03/17/2024, daughter over the phone 03/19/2024, son bedside 03/20/2024  Code Status : Full code  Consults  : Pulmonary, cardiology, nephrology, cardiothoracic surgery  PUD Prophylaxis :     Procedures  :     Right-sided chest tube placement by pulmonary critical care on 03/18/2024  TTE -    1. Left ventricular ejection fraction, by estimation, is 30 to 35%. The left ventricle has moderately decreased function. The left ventricle  demonstrates global hypokinesis. The left ventricular internal cavity size was mildly dilated. Left ventricular diastolic parameters are indeterminate.   2. Abnormal septal motion and septal flattening suggesting significant pulmonary HTN. Right ventricular systolic function is severely reduced. The  right ventricular size is moderately enlarged. Moderately increased right ventricular wall thickness. There is severely elevated pulmonary artery systolic pressure.   3. Left atrial size was moderately dilated.   4. Right atrial  size was severely dilated.   5. The mitral valve is abnormal. Mild mitral valve regurgitation. No evidence of mitral stenosis.   6. Tricuspid valve regurgitation is moderate.   7. The aortic valve is tricuspid. Aortic valve regurgitation is not  visualized. No aortic stenosis is present.   8. The inferior vena cava is dilated in size with <50% respiratory variability, suggesting right atrial pressure of 15 mmHg.       Disposition Plan  :    Status is: Inpatient   DVT Prophylaxis  :    Place TED hose Start: 03/17/24 1010 SCDs Start: 03/16/24 1742 apixaban  (ELIQUIS ) tablet 5 mg   Lab Results  Component Value Date   PLT 166 03/21/2024    Diet :  Diet Order             Diet Heart Room service appropriate? Yes; Fluid consistency: Thin; Fluid restriction: 1500 mL Fluid  Diet effective now                    Inpatient Medications  Scheduled Meds:  (feeding supplement) PROSource Plus  30 mL Oral BID BM   amiodarone   200 mg Oral Daily   apixaban   5 mg Oral BID   cholecalciferol   1,000 Units Oral Daily   docusate sodium   100 mg Oral BID   dorzolamide -timolol   1 drop Both Eyes BID   furosemide   40 mg Intravenous Daily   levothyroxine   50 mcg Oral Q0600   metoprolol  succinate  25 mg Oral QPM   midodrine   10 mg Oral TID WC   Continuous Infusions:   PRN Meds:.acetaminophen  **OR** acetaminophen , albuterol , bisacodyl , hydrALAZINE , ondansetron  **OR** ondansetron  (ZOFRAN ) IV, oxyCODONE , polyethylene glycol  Antibiotics  :    Anti-infectives (From admission, onward)    None         Objective:   Vitals:   03/21/24 0028 03/21/24 0342 03/21/24 0500 03/21/24 0801  BP:  104/81  (!) 108/93  Pulse: 73 83  89  Resp:  (!) 22  (!) 23  Temp:  (!) 97.5 F (36.4 C)  97.8 F (36.6 C)  TempSrc:  Oral  Oral  SpO2: 92% 98%  94%  Weight:   76.6 kg   Height:        Wt Readings from Last 3 Encounters:  03/21/24 76.6 kg  03/16/24 75.6 kg  03/05/24 73 kg      Intake/Output Summary (Last 24 hours) at 03/21/2024 0923 Last data filed at 03/21/2024 0600 Gross per 24 hour  Intake 556.17 ml  Output 850 ml  Net -293.83 ml     Physical Exam  Awake Alert, No new F.N deficits, Normal affect [place].AT,PERRAL Supple Neck, No JVD,   Symmetrical Chest wall movement, Good air movement bilaterally, few crackles, right-sided chest tube in place RRR,No Gallops,Rubs or new Murmurs,  +ve B.Sounds, Abd Soft, No tenderness,   Trace edema     Data Review:    Recent Labs  Lab 03/17/24 0338 03/18/24 0332 03/19/24 0427 03/20/24 0614 03/21/24 0548  WBC 6.2 6.4 6.7 6.2 7.1  HGB 12.5* 12.2* 12.4* 12.6* 12.8*  HCT 39.7 39.7 40.3 40.9 42.2  PLT 178 174 165 143* 166  MCV 93.2 94.3 95.5 93.8 95.0  MCH 29.3 29.0 29.4 28.9 28.8  MCHC 31.5 30.7 30.8 30.8 30.3  RDW 14.5 14.4 14.3 14.5 14.6  LYMPHSABS 0.5* 0.5* 0.5* 0.4* 0.5*  MONOABS 0.9 1.1* 1.0 0.9 0.9  EOSABS 0.0 0.1 0.1 0.1 0.1  BASOSABS 0.0 0.0 0.0 0.0 0.0    Recent Labs  Lab 03/16/24 1923 03/17/24 0338 03/18/24 0332 03/19/24 0427 03/20/24 0614 03/21/24 0548  NA 139 138 137 135 137  --   K 3.7 3.5 3.5 3.4* 4.0  --  CL 102 103 101 101 102  --   CO2 27 30 29 28 28   --   ANIONGAP 10 5 7 6 7   --   GLUCOSE 123* 91 101* 112* 101*  --   BUN 33* 38* 48* 42* 42*  --   CREATININE 1.27* 1.58* 1.34* 1.34* 1.32*  --   AST 21 16  --   --   --   --   ALT 19 17  --   --   --   --   ALKPHOS 100 83  --   --   --   --   BILITOT 1.1 0.8  --   --   --   --   ALBUMIN  2.6* 2.3*  --   --   --   --   CRP  --   --  3.9* 3.5* 3.9* 4.1*  PROCALCITON  --   --  <0.10 <0.10 <0.10  --   TSH  --  27.131*  --   --   --   --   BNP 1,203.9*  --  1,029.3* 1,086.3* 1,050.7* 1,089.6*  MG 2.3 2.2 2.2 2.1 2.1  --   PHOS 4.4 4.8* 4.5 3.3 2.9 2.7  CALCIUM 8.6* 8.3* 8.7* 8.5* 8.7*  --       Recent Labs  Lab 03/16/24 1923 03/17/24 0338 03/18/24 0332 03/19/24 0427 03/20/24 0614 03/21/24 0548  CRP  --   --  3.9* 3.5*  3.9* 4.1*  PROCALCITON  --   --  <0.10 <0.10 <0.10  --   TSH  --  27.131*  --   --   --   --   BNP 1,203.9*  --  1,029.3* 1,086.3* 1,050.7* 1,089.6*  MG 2.3 2.2 2.2 2.1 2.1  --   CALCIUM 8.6* 8.3* 8.7* 8.5* 8.7*  --     --------------------------------------------------------------------------------------------------------------- No results found for: "CHOL", "HDL", "LDLCALC", "LDLDIRECT", "TRIG", "CHOLHDL"  No results found for: "HGBA1C" No results for input(s): "TSH", "T4TOTAL", "FREET4", "T3FREE", "THYROIDAB" in the last 72 hours.  No results for input(s): "VITAMINB12", "FOLATE", "FERRITIN", "TIBC", "IRON", "RETICCTPCT" in the last 72 hours. ------------------------------------------------------------------------------------------------------------------ Cardiac Enzymes No results for input(s): "CKMB", "TROPONINI", "MYOGLOBIN" in the last 168 hours.  Invalid input(s): "CK"  Micro Results No results found for this or any previous visit (from the past 240 hours).  Radiology Report ECHOCARDIOGRAM LIMITED Result Date: 03/20/2024    ECHOCARDIOGRAM LIMITED REPORT   Patient Name:   Samuel Porter Date of Exam: 03/20/2024 Medical Rec #:  [id number]       Height:       73.0 in Accession #:    [id number]      Weight:       166.2 lb Date of Birth:  Feb 25, 1941        BSA:          1.989 m Patient Age:    82 years        BP:           129/77 mmHg Patient Gender: M               HR:           122 bpm. Exam Location:  Inpatient Procedure: 2D Echo, Cardiac Doppler and Color Doppler (Both Spectral and Color            Flow Doppler were utilized during procedure). Indications:    CHF  History:        Patient has  prior history of Echocardiogram examinations, most                 recent 12/29/2023. Pleural effusion.  Sonographer:    Jeralene Mom Referring Phys: [id number] BRIDGETTE CHRISTOPHER IMPRESSIONS  1. Left ventricular ejection fraction, by estimation, is 30 to 35%. The left ventricle has moderately  decreased function. The left ventricle demonstrates global hypokinesis. The left ventricular internal cavity size was mildly dilated. Left ventricular diastolic parameters are indeterminate.  2. Abnormal septal motion and septal flattening suggesting significant pulmnary HTN. Right ventricular systolic function is severely reduced. The right ventricular size is moderately enlarged. Moderately increased right ventricular wall thickness. There  is severely elevated pulmonary artery systolic pressure.  3. Left atrial size was moderately dilated.  4. Right atrial size was severely dilated.  5. The mitral valve is abnormal. Mild mitral valve regurgitation. No evidence of mitral stenosis.  6. Tricuspid valve regurgitation is moderate.  7. The aortic valve is tricuspid. Aortic valve regurgitation is not visualized. No aortic stenosis is present.  8. The inferior vena cava is dilated in size with <50% respiratory variability, suggesting right atrial pressure of 15 mmHg. FINDINGS  Left Ventricle: Left ventricular ejection fraction, by estimation, is 30 to 35%. The left ventricle has moderately decreased function. The left ventricle demonstrates global hypokinesis. Strain was performed and the global longitudinal strain is indeterminate. The left ventricular internal cavity size was mildly dilated. There is no left ventricular hypertrophy. Left ventricular diastolic parameters are indeterminate. Right Ventricle: Abnormal septal motion and septal flattening suggesting significant pulmnary HTN. The right ventricular size is moderately enlarged. Moderately increased right ventricular wall thickness. Right ventricular systolic function is severely reduced. There is severely elevated pulmonary artery systolic pressure. The tricuspid regurgitant velocity is 3.73 m/s, and with an assumed right atrial pressure of 15 mmHg, the estimated right ventricular systolic pressure is 70.7 mmHg. Left Atrium: Left atrial size was moderately  dilated. Right Atrium: Right atrial size was severely dilated. Pericardium: Trivial pericardial effusion is present. The pericardial effusion is posterior to the left ventricle. Mitral Valve: The mitral valve is abnormal. There is mild thickening of the mitral valve leaflet(s). Mild mitral valve regurgitation. No evidence of mitral valve stenosis. Tricuspid Valve: The tricuspid valve is normal in structure. Tricuspid valve regurgitation is moderate . No evidence of tricuspid stenosis. Aortic Valve: The aortic valve is tricuspid. Aortic valve regurgitation is not visualized. No aortic stenosis is present. Aortic valve mean gradient measures 2.0 mmHg. Aortic valve peak gradient measures 4.3 mmHg. Aortic valve area, by VTI measures 2.19 cm. Pulmonic Valve: The pulmonic valve was normal in structure. Pulmonic valve regurgitation is not visualized. No evidence of pulmonic stenosis. Aorta: The aortic root is normal in size and structure. Venous: The inferior vena cava is dilated in size with less than 50% respiratory variability, suggesting right atrial pressure of 15 mmHg. IAS/Shunts: No atrial level shunt detected by color flow Doppler. Additional Comments: 3D was performed not requiring image post processing on an independent workstation and was indeterminate.  LEFT VENTRICLE PLAX 2D LVIDd:         4.40 cm   Diastology LVIDs:         3.70 cm   LV e' medial:    3.48 cm/s LV PW:         0.90 cm   LV E/e' medial:  27.7 LV IVS:        0.90 cm   LV e' lateral:   10.60 cm/s LVOT  diam:     2.10 cm   LV E/e' lateral: 9.1 LV SV:         37 LV SV Index:   18 LVOT Area:     3.46 cm  RIGHT VENTRICLE RV Basal diam:  4.80 cm RV Mid diam:    4.00 cm RV S prime:     9.03 cm/s TAPSE (M-mode): 0.9 cm LEFT ATRIUM             Index        RIGHT ATRIUM           Index LA diam:        4.40 cm 2.21 cm/m   RA Area:     34.70 cm LA Vol (A2C):   93.5 ml 47.01 ml/m  RA Volume:   134.00 ml 67.37 ml/m LA Vol (A4C):   58.9 ml 29.61 ml/m LA  Biplane Vol: 75.1 ml 37.76 ml/m  AORTIC VALVE AV Area (Vmax):    2.47 cm AV Area (Vmean):   2.32 cm AV Area (VTI):     2.19 cm AV Vmax:           104.00 cm/s AV Vmean:          68.400 cm/s AV VTI:            0.168 m AV Peak Grad:      4.3 mmHg AV Mean Grad:      2.0 mmHg LVOT Vmax:         74.10 cm/s LVOT Vmean:        45.800 cm/s LVOT VTI:          0.106 m LVOT/AV VTI ratio: 0.63  AORTA Ao Asc diam: 3.60 cm MITRAL VALVE               TRICUSPID VALVE MV Area (PHT): 5.54 cm    TR Peak grad:   55.7 mmHg MV Decel Time: 137 msec    TR Vmax:        373.00 cm/s MR Peak grad: 34.3 mmHg MR Vmax:      293.00 cm/s  SHUNTS MV E velocity: 96.40 cm/s  Systemic VTI:  0.11 m MV A velocity: 30.00 cm/s  Systemic Diam: 2.10 cm MV E/A ratio:  3.21 Janelle Mediate MD Electronically signed by Janelle Mediate MD Signature Date/Time: 03/20/2024/3:59:37 PM    Final    DG CHEST PORT 1 VIEW Result Date: 03/20/2024 CLINICAL DATA:  Chest tube in place. EXAM: PORTABLE CHEST 1 VIEW COMPARISON:  March 19, 2024. FINDINGS: Stable cardiomediastinal silhouette. Mild central pulmonary vascular congestion is noted with possible bilateral pulmonary edema. Stable right-sided chest tube with stable small right hydropneumothorax. Bony thorax is unremarkable. IMPRESSION: Stable right-sided chest tube with stable small right hydropneumothorax. Mild central pulmonary vascular congestion with possible bilateral pulmonary edema. Electronically Signed   By: Rosalene Colon M.D.   On: 03/20/2024 08:22   DG CHEST PORT 1 VIEW Result Date: 03/19/2024 CLINICAL DATA:  [id number] Chest tube in place [id number]. EXAM: PORTABLE CHEST 1 VIEW COMPARISON:  03/19/2024, 6:31 a.m. FINDINGS: There is small residual right pneumothorax, decreased since the recent prior study, status post presumed pleural drainage catheter exchange. Redemonstration of chronic interstitial and alveolar opacities throughout bilateral lungs without significant interval change. No left pneumothorax. There is  blunting of bilateral lateral costophrenic angles, concerning for underlying bilateral trace pleural effusions. Stable cardio-mediastinal silhouette. No acute osseous abnormalities. The soft tissues are within normal limits. IMPRESSION: 1. Small residual  right pneumothorax, decreased since the recent prior study, status post presumed pleural drainage catheter exchange. 2. Redemonstration of chronic interstitial and alveolar opacities throughout bilateral lungs without significant interval change. Electronically Signed   By: Beula Brunswick M.D.   On: 03/19/2024 11:36     Signature  -   Lynnwood Sauer M.D on 03/21/2024 at 9:23 AM   -  To page go to [email or web address]

## 2024-03-22 DIAGNOSIS — I5043 Acute on chronic combined systolic (congestive) and diastolic (congestive) heart failure: Secondary | ICD-10-CM | POA: Diagnosis not present

## 2024-03-22 DIAGNOSIS — I4819 Other persistent atrial fibrillation: Secondary | ICD-10-CM

## 2024-03-22 DIAGNOSIS — J9601 Acute respiratory failure with hypoxia: Secondary | ICD-10-CM | POA: Diagnosis not present

## 2024-03-22 DIAGNOSIS — E8809 Other disorders of plasma-protein metabolism, not elsewhere classified: Secondary | ICD-10-CM | POA: Diagnosis not present

## 2024-03-22 DIAGNOSIS — J849 Interstitial pulmonary disease, unspecified: Secondary | ICD-10-CM | POA: Diagnosis not present

## 2024-03-22 DIAGNOSIS — J90 Pleural effusion, not elsewhere classified: Secondary | ICD-10-CM | POA: Diagnosis not present

## 2024-03-22 LAB — BASIC METABOLIC PANEL WITH GFR
Anion gap: 9 (ref 5–15)
BUN: 37 mg/dL — ABNORMAL HIGH (ref 8–23)
CO2: 31 mmol/L (ref 22–32)
Calcium: 8.4 mg/dL — ABNORMAL LOW (ref 8.9–10.3)
Chloride: 96 mmol/L — ABNORMAL LOW (ref 98–111)
Creatinine, Ser: 1.06 mg/dL (ref 0.61–1.24)
GFR, Estimated: >60 mL/min (ref >60)
Glucose, Bld: 86 mg/dL (ref 70–99)
Potassium: 4.4 mmol/L (ref 3.5–5.1)
Sodium: 136 mmol/L (ref 135–145)

## 2024-03-22 LAB — CBC WITH DIFFERENTIAL/PLATELET
Abs Immature Granulocytes: 0.04 10*3/uL (ref 0.00–0.07)
Basophils Absolute: 0 10*3/uL (ref 0.0–0.1)
Basophils Relative: 0 %
Eosinophils Absolute: 0.1 10*3/uL (ref 0.0–0.5)
Eosinophils Relative: 1 %
HCT: 42.8 % (ref 39.0–52.0)
Hemoglobin: 13.3 g/dL (ref 13.0–17.0)
Immature Granulocytes: 1 %
Lymphocytes Relative: 7 %
Lymphs Abs: 0.5 10*3/uL — ABNORMAL LOW (ref 0.7–4.0)
MCH: 29.2 pg (ref 26.0–34.0)
MCHC: 31.1 g/dL (ref 30.0–36.0)
MCV: 94.1 fL (ref 80.0–100.0)
Monocytes Absolute: 0.9 10*3/uL (ref 0.1–1.0)
Monocytes Relative: 13 %
Neutro Abs: 5.6 10*3/uL (ref 1.7–7.7)
Neutrophils Relative %: 78 %
Platelets: 155 10*3/uL (ref 150–400)
RBC: 4.55 MIL/uL (ref 4.22–5.81)
RDW: 14.4 % (ref 11.5–15.5)
WBC: 7.2 10*3/uL (ref 4.0–10.5)
nRBC: 0 % (ref 0.0–0.2)

## 2024-03-22 LAB — PROCALCITONIN: Procalcitonin: <0.1 ng/mL

## 2024-03-22 MED ORDER — HEPARIN (PORCINE) 25000 UT/250ML-% IV SOLN
1200.0000 [IU]/h | INTRAVENOUS | Status: DC
  Administered 2024-03-22: 1200 [IU]/h via INTRAVENOUS
  Filled 2024-03-22: qty 250

## 2024-03-22 MED ORDER — SODIUM CHLORIDE 0.9 % IV SOLN: INTRAVENOUS | Status: DC

## 2024-03-22 MED ORDER — FUROSEMIDE 10 MG/ML IJ SOLN
80.0000 mg | Freq: Once | INTRAMUSCULAR | Status: AC
  Administered 2024-03-22: 80 mg via INTRAVENOUS
  Filled 2024-03-22: qty 8

## 2024-03-22 NOTE — Plan of Care (Addendum)
 Pt has rested quietly throughout the night with no distress noted. Alert and oriented. On O2 1LNC. A-fib on the monitor. Right chest tube intact to suction. Voids per urinal. Was up in chair till about 11pm. No complaints voiced.     Problem: Health Behavior/Discharge Planning: Goal: Ability to manage health-related needs will improve Outcome: Progressing   Problem: Clinical Measurements: Goal: Respiratory complications will improve Outcome: Progressing Goal: Cardiovascular complication will be avoided Outcome: Progressing   Problem: Activity: Goal: Risk for activity intolerance will decrease Outcome: Progressing   Problem: Pain Managment: Goal: General experience of comfort will improve and/or be controlled Outcome: Progressing

## 2024-03-22 NOTE — Plan of Care (Signed)
 Patient's nurse notified me that patient is going for cardiac cath tomorrow.  I reviewed the chart.  Patient is scheduled for right heart cath tomorrow.  In anticipation of cardiac catheter we will hold Eliquis  (on for A-fib) and bridge with heparin  infusion.    Samuel Porter.

## 2024-03-22 NOTE — Plan of Care (Signed)
  Problem: Health Behavior/Discharge Planning: Goal: Ability to manage health-related needs will improve Outcome: Progressing   Problem: Clinical Measurements: Goal: Ability to maintain clinical measurements within normal limits will improve Outcome: Progressing Goal: Will remain free from infection Outcome: Progressing Goal: Diagnostic test results will improve Outcome: Progressing Goal: Respiratory complications will improve Outcome: Progressing Goal: Cardiovascular complication will be avoided Outcome: Progressing   Problem: Activity: Goal: Risk for activity intolerance will decrease Outcome: Progressing   Problem: Nutrition: Goal: Adequate nutrition will be maintained Outcome: Progressing   Problem: Elimination: Goal: Will not experience complications related to bowel motility Outcome: Progressing Goal: Will not experience complications related to urinary retention Outcome: Progressing   Problem: Pain Managment: Goal: General experience of comfort will improve and/or be controlled Outcome: Progressing   Problem: Safety: Goal: Ability to remain free from injury will improve Outcome: Progressing   Problem: Skin Integrity: Goal: Risk for impaired skin integrity will decrease Outcome: Progressing   Problem: Education: Goal: Ability to demonstrate management of disease process will improve Outcome: Progressing Goal: Ability to verbalize understanding of medication therapies will improve Outcome: Progressing Goal: Individualized Educational Video(s) Outcome: Progressing   Problem: Activity: Goal: Capacity to carry out activities will improve Outcome: Progressing   Problem: Cardiac: Goal: Ability to achieve and maintain adequate cardiopulmonary perfusion will improve Outcome: Progressing   Problem: Education: Goal: Knowledge of disease or condition will improve Outcome: Progressing Goal: Understanding of medication regimen will improve Outcome:  Progressing Goal: Individualized Educational Video(s) Outcome: Progressing   Problem: Activity: Goal: Ability to tolerate increased activity will improve Outcome: Progressing   Problem: Cardiac: Goal: Ability to achieve and maintain adequate cardiopulmonary perfusion will improve Outcome: Progressing   Problem: Health Behavior/Discharge Planning: Goal: Ability to safely manage health-related needs after discharge will improve Outcome: Progressing

## 2024-03-22 NOTE — Progress Notes (Signed)
 NAME:  Samuel Porter, MRN:  [id number], DOB:  1941-10-09, LOS: 6 ADMISSION DATE:  03/16/2024, CONSULTATION DATE:  03/22/24 REFERRING MD:  TRH, CHIEF COMPLAINT:  DOE   History of Present Illness:  83 year old man history of CHF presented from pulmonary clinic with worsening dyspnea exertion, hypoxemia, lower extremity swelling, and A-fib with RVR concerning for CHF exacerbation with recurrent right greater than left pleural effusion.  Patient about 2 weeks ago had a cardioversion for A-fib.  Presented to pulm clinic today with worsening shortness of breath for few days.  Associated with worsening lower extremity swelling.  He was unaware he was back in A-fib.  Noted to be progressive hypoxemic 89% yesterday.  82% on room air today in clinic.  Chest x-ray obtained showed reaccumulation, worsening right-sided pleural effusion compared to May on my review interpretation.  He states when he is had thoracentesis in the past that does improve his symptoms.  He admits to weight gain and lower extremity swelling as discussed above.  He had thoracentesis at [facility] 4/24 -2.2 L of fluid removed Prior fluid studies are borderline but appear exudative.  Lymphocyte predominance is peculiar and unexpected..  Labs today with markedly elevated pro BNP.  Pertinent  Medical History  CHF with reduced EF A-fib with RVR regionally recurrent skin cancer to the neck s/p neoadjuvant cemipilumab with great response   s/p right modified radical neck dissection levels 1-4 and left neck level 1b lymph node biopsy on 04/15/23 - pathology revealed a path CR.   Significant Hospital Events: Including procedures, antibiotic start and stop dates in addition to other pertinent events   6/4 right pigtail placement 6/6 750 cc chest tube output 6/6 echo shows EF 30 to 35%, global hypokinesis, RV SP estimated 71  Interim History / Subjective:   Denies dyspnea or chest pain Able to ambulate on oxygen Only 70 cc output documented  on chest tube Objective    Blood pressure (!) 115/100, pulse 84, temperature 97.7 F (36.5 C), temperature source Oral, resp. rate (!) 22, height 6\' 1"  (1.854 m), weight 75.5 kg, SpO2 95%.        Intake/Output Summary (Last 24 hours) at 03/22/2024 1408 Last data filed at 03/22/2024 0830 Gross per 24 hour  Intake 720 ml  Output 1970 ml  Net -1250 ml   Filed Weights   03/19/24 0500 03/21/24 0500 03/22/24 0600  Weight: 75.4 kg 76.6 kg 75.5 kg    Examination: General: Frail elderly gentleman lying in stretcher HENT: No JVD temporal wasting + Lungs: Decreased breath sounds of breath, no accessory muscle use Cardiovascular: Tachycardic, irregularly irregular Abdomen: Soft, nontender Extremities: No edema Neuro: No focal deficits noted  Labs show normal electrolytes, procalcitonin negative, BNP remains high, no leukocytosis  HRCT favors fibrotic NSIP, right hydropneumothorax with multiloculated effusion  Resolved problem list   Assessment and Plan   Bilateral pleural effusions with large recurrent right pleural effusion: Initial tap at [facility] 01/2024, then again 5/25, both borderline exudate with high protein and LDH.   he has cardiomyopathy and EF seems much worse than 12/2023 and he has also developed severe pulmonary hypertension   He improves symptomatically with thoracentesis.  High suspicion related to cardiogenic fluid overload, unclear why asymmetric. BNP markedly elevated.  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 Alease Amend - RHC planned for tomorrow   Acute hypoxemic respiratory failure: Suspect in the setting of volume overload, CHF exacerbation given onset of symptoms dyspnea hypoxemia in concurrence with worsening lower extremity swelling.  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 , dates back to 2024 -- Diurese based on BNP - Amiodarone  would not be a good long-term option for him - Reassess oxygen needs on discharge   Updated daughters at bedside     Best Practice (right click and "Reselect all SmartList Selections" daily)   Per primary  Labs   CBC: Recent Labs  Lab 03/18/24 0332 03/19/24 0427 03/20/24 0614 03/21/24 0548 03/22/24 0531  WBC 6.4 6.7 6.2 7.1 7.2  NEUTROABS 4.7 5.1 4.8 5.5 5.6  HGB 12.2* 12.4* 12.6* 12.8* 13.3  HCT 39.7 40.3 40.9 42.2 42.8  MCV 94.3 95.5 93.8 95.0 94.1  PLT 174 165 143* 166 155    Basic Metabolic Panel: Recent Labs  Lab 03/16/24 1923 03/17/24 0338 03/18/24 0332 03/19/24 0427 03/20/24 0614 03/21/24 0548 03/22/24 0531  NA 139 138 137 135 137  --  136  K 3.7 3.5 3.5 3.4* 4.0  --  4.4  CL 102 103 101 101 102  --  96*  CO2 27 30 29 28 28   --  31  GLUCOSE 123* 91 101* 112* 101*  --  86  BUN 33* 38* 48* 42* 42*  --  37*  CREATININE 1.27* 1.58* 1.34* 1.34* 1.32*  --  1.06  CALCIUM 8.6* 8.3* 8.7* 8.5* 8.7*  --  8.4*  MG 2.3 2.2 2.2 2.1 2.1  --   --   PHOS 4.4 4.8* 4.5 3.3 2.9 2.7  --    GFR: Estimated Creatinine Clearance: 57.4 mL/min (by C-G formula based on SCr of 1.06 mg/dL). Recent Labs  Lab 03/19/24 0427 03/20/24 0614 03/21/24 0548 03/22/24 0531  PROCALCITON <0.10 <0.10 <0.10 <0.10  WBC 6.7 6.2 7.1 7.2    Liver Function Tests: Recent Labs  Lab 03/16/24 1923 03/17/24 0338  AST 21 16  ALT 19 17  ALKPHOS 100 83  BILITOT 1.1 0.8  PROT 6.6 5.9*  ALBUMIN  2.6* 2.3*   No results for input(s): "LIPASE", "AMYLASE" in the last 168 hours. No  results for input(s): "AMMONIA" in the last 168 hours.  ABG    Component Value Date/Time   HCO3 38.1 (H) 07/11/2023 1659   TCO2 40 (H) 07/11/2023 1659   O2SAT 54 07/11/2023 1659     Coagulation Profile: No results for input(s): "INR", "PROTIME" in the last 168 hours.  Cardiac Enzymes: No results for input(s): "CKTOTAL", "CKMB", "CKMBINDEX", "TROPONINI" in the last 168 hours.  HbA1C: No results found for: "HGBA1C"  CBG: No results for input(s): "GLUCAP" in the last 168 hours.   Jennifer Moellers [place] Greaser, MD   Celene Coins MD. FCCP. [facility] Pager : [phone]  If no response to pager , please call [phone] until 7 pm After 7:00 pm call Elink  [phone]   03/22/2024

## 2024-03-22 NOTE — Progress Notes (Signed)
 PROGRESS NOTE                                                                                                                                                                                                             Patient Demographics:    Samuel Porter, is a 83 y.o. male, DOB - 1941-06-08, ZOX:[id number]  Outpatient Primary MD for the patient is [name], MD    LOS - 6  Admit date - 03/16/2024    Chief Complaint  Patient presents with   Shortness of Breath       Brief Narrative (HPI from H&P)   83 y.o. male with medical history significant for but not limited to BPH, glaucoma, hypertension, vitamin D  deficiency, atrial fibrillation on anticoagulation with recent cardioversion about 2 weeks ago, history of pleural effusions and other comorbidities who presents with worsening shortness of breath last 3 days.  Patient states that he started developing lower extremity edema that progressively got worse as well as worsening shortness of breath that started 3 days ago.  He states that he was at home and is monitoring his O2 saturations and his O2 saturation for 3 days were about 95% on room air and subsequently continued to drop over the 3 days.  Patient went to go see his pulmonologist today and is found to be hypoxic on room air so they directly admitted him to hospital for further evaluation.   In the ER workup consistent with A-fib RVR, acute on chronic diastolic CHF, recurrent pleural effusion right more than left, he also had hypoxic respiratory failure and was admitted to the hospital.     Subjective:   Patient in bed, appears comfortable, denies any headache, no fever, no chest pain or pressure, no shortness of breath , no abdominal pain. No new focal weakness.    Assessment  & Plan :    Acute respiratory failure with hypoxia in the setting of A-fib with RVR, acute on chronic systolic CHF EF around 40%, recurrent  bilateral pleural effusions right more than left.   Patient currently on 3 to 4 L nasal cannula oxygen, pulmonary on board underwent right-sided chest tube placement on 03/18/2024, has lung entrapment syndrome which appears like a pneumothorax but it is not, pleural fluid LDH is higher than serum suggesting this likely is exudative collection, follow pleural fluid  cytology initial cytology does not show any malignant cells, case discussed with pulmonary critical care, upon the request cardiothoracic surgery also consulted who do not think patient is a good candidate for biopsy, they recommend considering Pleurx catheter placement.  Will defer this to pulmonary.    Continue supplemental oxygen, up in chair with I-S and flutter valve for pulmonary toiletry, for management of pulmonary issues to PCCM team.  Home Eliquis  resumed 03/19/2024.   Paroxysmal A-fib and RVR, acute on chronic systolic heart failure EF 35%, reduced RV systolic function as well.  Currently on amiodarone  drip for rate control, Eliquis  as above, cardiology consulted, will defer cardiac issues to the cardiology team.  Monitor on telemetry.  Low-dose beta-blocker if tolerated by blood pressure, blood pressure and renal function precludes the use of ACE/ARB or Entresto.  Repeat limited echo done 03/20/2024 noted with low EF of 35% along with elevated reduced RV systolic function and increased RV pressures likely due to underlying pulmonary hypertension.  CHF team following contemplating right heart catheterization on 03/23/2024.  Recurrent effusion right more than left.  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  Likely will be discharged home on Pleurx catheter.  Case  discussed with pulmonary on 03/22/2024.   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  Elevated troponin: Mildly flattened in setting of A-fib with RVR and acute on chronic CHF.  And is flat, cardiology on board.  Chest pain-free.   AKI.  Nephrology consulted.  Will defer diuretic management to nephrology.  Glaucoma: Continue home eyedrops.   BPH: Currently holding his home finasteride    Essential hypertension/currently hypotensive: Continue blood pressure low, hold beta-blocker, midodrine  added, albumin , Prosource, as needed IV hydralazine , monitor.  TSH is extremely high started on Synthroid , will random a.m. cortisol.   Hypoalbuminemia: Patient's Albumin  Level is now 2.6. CTM and Trend and repeat CMP in the AM, placed on Prosource.   Hypothyroidism.  TSH greater than 25 this was the day after admission, free T4 0.71, low-dose Synthroid  and monitor.  Now amiodarone  has been added hands outpatient should be monitored by endocrinologist.       Condition - Extremely Guarded  Family Communication  : Daughter bedside 03/17/2024, daughter over the phone 03/19/2024, son bedside 03/20/2024  Code Status : Full code  Consults  : Pulmonary, cardiology, nephrology, cardiothoracic surgery  PUD Prophylaxis :     Procedures  :     Right-sided chest tube placement by pulmonary critical care on 03/18/2024  TTE -    1. Left ventricular ejection fraction, by estimation, is 30 to 35%. The left ventricle has moderately decreased function. The left ventricle  demonstrates global hypokinesis. The left ventricular internal cavity size was mildly dilated. Left ventricular diastolic parameters are indeterminate.   2. Abnormal septal motion and septal flattening suggesting significant pulmonary HTN. Right ventricular systolic function is severely reduced. The  right ventricular size is moderately enlarged.  Moderately increased right ventricular wall thickness. There is severely elevated pulmonary artery systolic pressure.   3. Left atrial size was moderately dilated.   4. Right atrial size was severely dilated.   5. The mitral valve is abnormal. Mild mitral  valve regurgitation. No evidence of mitral stenosis.   6. Tricuspid valve regurgitation is moderate.   7. The aortic valve is tricuspid. Aortic valve regurgitation is not visualized. No aortic stenosis is present.   8. The inferior vena cava is dilated in size with <50% respiratory variability, suggesting right atrial pressure of 15 mmHg.       Disposition Plan  :    Status is: Inpatient   DVT Prophylaxis  :    Place TED hose Start: 03/17/24 1010 SCDs Start: 03/16/24 1742 apixaban  (ELIQUIS ) tablet 5 mg   Lab Results  Component Value Date   PLT 155 03/22/2024    Diet :  Diet Order             Diet Heart Room service appropriate? Yes; Fluid consistency: Thin; Fluid restriction: 1500 mL Fluid  Diet effective now                    Inpatient Medications  Scheduled Meds:  (feeding supplement) PROSource Plus  30 mL Oral BID BM   amiodarone   200 mg Oral Daily   apixaban   5 mg Oral BID   cholecalciferol   1,000 Units Oral Daily   digoxin  0.0625 mg Oral Daily   docusate sodium   100 mg Oral BID   dorzolamide -timolol   1 drop Both Eyes BID   levothyroxine   50 mcg Oral Q0600   midodrine   10 mg Oral TID WC   Continuous Infusions:   PRN Meds:.acetaminophen  **OR** acetaminophen , albuterol , bisacodyl , hydrALAZINE , ondansetron  **OR** ondansetron  (ZOFRAN ) IV, oxyCODONE , polyethylene glycol  Antibiotics  :    Anti-infectives (From admission, onward)    None         Objective:   Vitals:   03/21/24 2200 03/22/24 0000 03/22/24 0600 03/22/24 0904  BP:      Pulse:    (!) 102  Resp: 20     Temp:  (!) 97.2 F (36.2 C)    TempSrc:  Oral    SpO2:      Weight:   75.5 kg   Height:        Wt Readings from Last 3  Encounters:  03/22/24 75.5 kg  03/16/24 75.6 kg  03/05/24 73 kg     Intake/Output Summary (Last 24 hours) at 03/22/2024 0920 Last data filed at 03/22/2024 0600 Gross per 24 hour  Intake 746.38 ml  Output 2570 ml  Net -1823.62 ml     Physical Exam  Awake Alert, No new F.N deficits, Normal affect [place].AT,PERRAL Supple Neck, No JVD,   Symmetrical Chest wall movement, Good air movement bilaterally, few crackles, right-sided chest tube in place RRR,No Gallops,Rubs or new Murmurs,  +ve B.Sounds, Abd Soft, No tenderness,   Trace edema     Data Review:    Recent Labs  Lab 03/18/24 0332 03/19/24 0427 03/20/24 0614 03/21/24 0548 03/22/24 0531  WBC 6.4 6.7 6.2 7.1 7.2  HGB 12.2* 12.4* 12.6* 12.8* 13.3  HCT 39.7 40.3 40.9 42.2 42.8  PLT 174 165 143* 166 155  MCV 94.3 95.5 93.8 95.0 94.1  MCH 29.0 29.4 28.9 28.8 29.2  MCHC 30.7 30.8 30.8 30.3 31.1  RDW 14.4 14.3 14.5 14.6 14.4  LYMPHSABS 0.5* 0.5* 0.4* 0.5* 0.5*  MONOABS 1.1* 1.0 0.9 0.9 0.9  EOSABS 0.1 0.1 0.1 0.1 0.1  BASOSABS 0.0 0.0 0.0 0.0 0.0    Recent Labs  Lab 03/16/24 1923 03/17/24 1610 03/18/24 0332 03/19/24 0427 03/20/24 9604 03/21/24 0548 03/22/24 0531  NA  139 138 137 135 137  --   --   K 3.7 3.5 3.5 3.4* 4.0  --   --   CL 102 103 101 101 102  --   --   CO2 27 30 29 28 28   --   --   ANIONGAP 10 5 7 6 7   --   --   GLUCOSE 123* 91 101* 112* 101*  --   --   BUN 33* 38* 48* 42* 42*  --   --   CREATININE 1.27* 1.58* 1.34* 1.34* 1.32*  --   --   AST 21 16  --   --   --   --   --   ALT 19 17  --   --   --   --   --   ALKPHOS 100 83  --   --   --   --   --   BILITOT 1.1 0.8  --   --   --   --   --   ALBUMIN  2.6* 2.3*  --   --   --   --   --   CRP  --   --  3.9* 3.5* 3.9* 4.1*  --   PROCALCITON  --   --  <0.10 <0.10 <0.10 <0.10 <0.10  TSH  --  27.131*  --   --   --   --   --   BNP 1,203.9*  --  1,029.3* 1,086.3* 1,050.7* 1,089.6*  --   MG 2.3 2.2 2.2 2.1 2.1  --   --   PHOS 4.4 4.8* 4.5 3.3 2.9 2.7  --    CALCIUM 8.6* 8.3* 8.7* 8.5* 8.7*  --   --       Recent Labs  Lab 03/16/24 1923 03/17/24 0338 03/18/24 0332 03/19/24 0427 03/20/24 0614 03/21/24 0548 03/22/24 0531  CRP  --   --  3.9* 3.5* 3.9* 4.1*  --   PROCALCITON  --   --  <0.10 <0.10 <0.10 <0.10 <0.10  TSH  --  27.131*  --   --   --   --   --   BNP 1,203.9*  --  1,029.3* 1,086.3* 1,050.7* 1,089.6*  --   MG 2.3 2.2 2.2 2.1 2.1  --   --   CALCIUM 8.6* 8.3* 8.7* 8.5* 8.7*  --   --     --------------------------------------------------------------------------------------------------------------- No results found for: "CHOL", "HDL", "LDLCALC", "LDLDIRECT", "TRIG", "CHOLHDL"  No results found for: "HGBA1C" No results for input(s): "TSH", "T4TOTAL", "FREET4", "T3FREE", "THYROIDAB" in the last 72 hours.  No results for input(s): "VITAMINB12", "FOLATE", "FERRITIN", "TIBC", "IRON", "RETICCTPCT" in the last 72 hours. ------------------------------------------------------------------------------------------------------------------ Cardiac Enzymes No results for input(s): "CKMB", "TROPONINI", "MYOGLOBIN" in the last 168 hours.  Invalid input(s): "CK"  Micro Results No results found for this or any previous visit (from the past 240 hours).  Radiology Report CT Chest High Resolution Result Date: 03/21/2024 CLINICAL DATA:  Interstitial lung disease, pleural effusion, trapped lung, query pleural metastases * Tracking Code: BO * EXAM: CT CHEST WITHOUT CONTRAST TECHNIQUE: Multidetector CT imaging of the chest was performed following the standard protocol without intravenous contrast. High resolution imaging of the lungs, as well as inspiratory and expiratory imaging, was performed. RADIATION DOSE REDUCTION: This exam was performed according to the departmental dose-optimization program which includes automated exposure control, adjustment of the mA and/or kV according to patient size and/or use of iterative reconstruction technique.  COMPARISON:  03/04/2024 FINDINGS: Cardiovascular: Aortic atherosclerosis.  Cardiomegaly. Scattered left coronary artery calcifications. Enlargement of the main pulmonary artery measuring up to 3.5 cm in caliber. Similar small pericardial effusion. Mediastinum/Nodes: Unchanged prominent mediastinal and hilar lymph nodes. Thyroid  gland, trachea, and esophagus demonstrate no significant findings. Lungs/Pleura: Large, multiloculated right hydropneumothorax with a pigtail chest tube about the right lung base and a small pneumothorax component. 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 No significant air trapping on expiratory phase imaging. Small left pleural effusion, new compared to prior examination. Upper Abdomen: No acute abnormality. Musculoskeletal: No chest wall abnormality. No acute osseous findings. IMPRESSION: 1. Large, multiloculated right hydropneumothorax with a pigtail chest tube about the right lung base and a small pneumothorax component. 2. 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 3. Small left pleural effusion, new compared to prior examination. 4. Unchanged prominent mediastinal and hilar lymph nodes, likely reactive. 5. Cardiomegaly and coronary artery disease. 6.  Enlargement of the main pulmonary artery, as can be seen in pulmonary hypertension. Aortic Atherosclerosis (ICD10-I70.0). Electronically Signed   By: Fredricka Jenny M.D.   On: 03/21/2024 14:12   ECHOCARDIOGRAM LIMITED Result Date: 03/20/2024    ECHOCARDIOGRAM LIMITED REPORT   Patient Name:   KESHAV WINEGAR Date of Exam: 03/20/2024 Medical Rec #:  [id number]       Height:       73.0 in Accession #:    [id number]      Weight:       166.2 lb Date of Birth:  19-Feb-1941        BSA:          1.989 m Patient Age:    82 years        BP:           129/77 mmHg Patient Gender: M               HR:           122 bpm. Exam Location:  Inpatient Procedure: 2D Echo, Cardiac Doppler and Color Doppler (Both Spectral and Color            Flow Doppler were utilized during procedure). Indications:    CHF  History:        Patient has prior history of Echocardiogram examinations, most                 recent 12/29/2023. Pleural effusion.  Sonographer:    Jeralene Mom Referring Phys: [id number] BRIDGETTE CHRISTOPHER IMPRESSIONS  1. Left ventricular ejection fraction, by estimation, is 30 to 35%. The left ventricle has moderately decreased function. The left ventricle demonstrates global hypokinesis. The left ventricular internal cavity size was mildly dilated. Left ventricular diastolic parameters are indeterminate.  2. Abnormal septal motion and septal flattening suggesting significant pulmnary HTN. Right ventricular systolic function is severely reduced. The right ventricular size is moderately enlarged. Moderately increased right ventricular wall thickness. There  is severely elevated pulmonary artery systolic pressure.  3. Left atrial size was moderately dilated.  4. Right atrial  size was severely dilated.  5. The mitral valve is abnormal. Mild mitral valve regurgitation. No evidence of mitral stenosis.  6. Tricuspid valve regurgitation is moderate.  7. The aortic valve is tricuspid. Aortic valve regurgitation is not visualized. No aortic  stenosis is present.  8. The inferior vena cava is dilated in size with <50% respiratory variability, suggesting right atrial pressure of 15 mmHg. FINDINGS  Left Ventricle: Left ventricular ejection fraction, by estimation, is 30 to 35%. The left ventricle has moderately decreased function. The left ventricle demonstrates global hypokinesis. Strain was performed and the global longitudinal strain is indeterminate. The left ventricular internal cavity size was mildly dilated. There is no left ventricular hypertrophy. Left ventricular diastolic parameters are indeterminate. Right Ventricle: Abnormal septal motion and septal flattening suggesting significant pulmnary HTN. The right ventricular size is moderately enlarged. Moderately increased right ventricular wall thickness. Right ventricular systolic function is severely reduced. There is severely elevated pulmonary artery systolic pressure. The tricuspid regurgitant velocity is 3.73 m/s, and with an assumed right atrial pressure of 15 mmHg, the estimated right ventricular systolic pressure is 70.7 mmHg. Left Atrium: Left atrial size was moderately dilated. Right Atrium: Right atrial size was severely dilated. Pericardium: Trivial pericardial effusion is present. The pericardial effusion is posterior to the left ventricle. Mitral Valve: The mitral valve is abnormal. There is mild thickening of the mitral valve leaflet(s). Mild mitral valve regurgitation. No evidence of mitral valve stenosis. Tricuspid Valve: The tricuspid valve is normal in structure. Tricuspid valve regurgitation is moderate . No evidence of tricuspid stenosis. Aortic Valve: The aortic valve is tricuspid. Aortic valve regurgitation is not visualized. No aortic stenosis is present. Aortic valve mean gradient measures 2.0 mmHg. Aortic valve peak gradient measures 4.3 mmHg. Aortic valve area, by VTI measures 2.19 cm. Pulmonic Valve: The pulmonic valve was normal in structure. Pulmonic valve  regurgitation is not visualized. No evidence of pulmonic stenosis. Aorta: The aortic root is normal in size and structure. Venous: The inferior vena cava is dilated in size with less than 50% respiratory variability, suggesting right atrial pressure of 15 mmHg. IAS/Shunts: No atrial level shunt detected by color flow Doppler. Additional Comments: 3D was performed not requiring image post processing on an independent workstation and was indeterminate.  LEFT VENTRICLE PLAX 2D LVIDd:         4.40 cm   Diastology LVIDs:         3.70 cm   LV e' medial:    3.48 cm/s LV PW:         0.90 cm   LV E/e' medial:  27.7 LV IVS:        0.90 cm   LV e' lateral:   10.60 cm/s LVOT diam:     2.10 cm   LV E/e' lateral: 9.1 LV SV:         37 LV SV Index:   18 LVOT Area:     3.46 cm  RIGHT VENTRICLE RV Basal diam:  4.80 cm RV Mid diam:    4.00 cm RV S prime:     9.03 cm/s TAPSE (M-mode): 0.9 cm LEFT ATRIUM             Index        RIGHT ATRIUM           Index LA diam:        4.40 cm 2.21 cm/m   RA Area:     34.70 cm LA Vol (A2C):   93.5  ml 47.01 ml/m  RA Volume:   134.00 ml 67.37 ml/m LA Vol (A4C):   58.9 ml 29.61 ml/m LA Biplane Vol: 75.1 ml 37.76 ml/m  AORTIC VALVE AV Area (Vmax):    2.47 cm AV Area (Vmean):   2.32 cm AV Area (VTI):     2.19 cm AV Vmax:           104.00 cm/s AV Vmean:          68.400 cm/s AV VTI:            0.168 m AV Peak Grad:      4.3 mmHg AV Mean Grad:      2.0 mmHg LVOT Vmax:         74.10 cm/s LVOT Vmean:        45.800 cm/s LVOT VTI:          0.106 m LVOT/AV VTI ratio: 0.63  AORTA Ao Asc diam: 3.60 cm MITRAL VALVE               TRICUSPID VALVE MV Area (PHT): 5.54 cm    TR Peak grad:   55.7 mmHg MV Decel Time: 137 msec    TR Vmax:        373.00 cm/s MR Peak grad: 34.3 mmHg MR Vmax:      293.00 cm/s  SHUNTS MV E velocity: 96.40 cm/s  Systemic VTI:  0.11 m MV A velocity: 30.00 cm/s  Systemic Diam: 2.10 cm MV E/A ratio:  3.21 Janelle Mediate MD Electronically signed by Janelle Mediate MD Signature Date/Time:  03/20/2024/3:59:37 PM    Final      Signature  -   Lynnwood Sauer M.D on 03/22/2024 at 9:20 AM   -  To page go to [email or web address]

## 2024-03-22 NOTE — Progress Notes (Signed)
 PHARMACY - ANTICOAGULATION CONSULT NOTE  Pharmacy Consult for Heparin  Indication: atrial fibrillation  No Known Allergies  Patient Measurements: Height: 6\' 1"  (185.4 cm) Weight: 73.2 kg (161 lb 6 oz) IBW/kg (Calculated) : 79.9 HEPARIN  DW (KG): 75.3  Vital Signs: Temp: 97.7 F (36.5 C) (06/08 1309) Temp Source: Oral (06/08 1309) BP: 115/100 (06/08 1309) Pulse Rate: 84 (06/08 1309)  Labs: Recent Labs    03/20/24 0614 03/21/24 0548 03/22/24 0531  HGB 12.6* 12.8* 13.3  HCT 40.9 42.2 42.8  PLT 143* 166 155  CREATININE 1.32*  --  1.06    Estimated Creatinine Clearance: 55.6 mL/min (by C-G formula based on SCr of 1.06 mg/dL).  Assessment: 83 yo M on apixaban  PTA for afib, held for thoracentesis. Pharmacy consulted for IV heparin .  Eliquis  resumed 6/5, now transitioning back to heparin  in anticipation of cardiac cath 6/9.  Last dose of Eliquis  today at 09:04  aPTT was therapeutic (72 seconds) on 1200 units/hr on 6/5, so will begin with this rate.  Goal of Therapy:  Heparin  level 0.3-0.7 units/ml aPTT 66-102 seconds Monitor platelets by anticoagulation protocol: Yes   Plan:  Start heparin  drip at 1200 units/hr Check aPTT and heparin  level in 8hrs F/u anticoag plans after cath tomorrow  Armanda Bern, PharmD, BCPS 03/22/2024,8:33 PM  Please check AMION for all [facility] phone numbers After 10:00 PM, call Main Pharmacy [phone]

## 2024-03-22 NOTE — Progress Notes (Signed)
 Mobility Specialist Progress Note;   03/22/24 1140  Mobility  Activity Ambulated with assistance in hallway  Level of Assistance Standby assist, set-up cues, supervision of patient - no hands on  Assistive Device Front wheel walker  Distance Ambulated (ft) 350 ft  Activity Response Tolerated well  Mobility Referral Yes  Mobility visit 1 Mobility  Mobility Specialist Start Time (ACUTE ONLY) 1140  Mobility Specialist Stop Time (ACUTE ONLY) 1156  Mobility Specialist Time Calculation (min) (ACUTE ONLY) 16 min   Pt in chair upon arrival, eager for mobility. On 1LO2 upon arrival. Required no physical assistance during ambulation, SV. Ambulated on 2LO2 to maintain SPO2 88%>. No c/o when asked. Pt returned back to chair with all needs met, back on suction. Son present.   Janit Meline Mobility Specialist Please contact via SecureChat or [employer] [phone]

## 2024-03-22 NOTE — Progress Notes (Signed)
    Advanced Heart Failure Rounding Note  PCP-Cardiologist: Arnoldo Lapping, MD   Subjective:     Improving with IV diuresis, JVP less elevated, creatinine improved. Discussed plan of care with patient and daughters at bedside. Remains in afib with controlled rates. Chest tube to be removed today.    Objective:   Weight Range: 75.5 kg Body mass index is 21.96 kg/m.   Vital Signs:   Temp:  [97.2 F (36.2 C)-97.9 F (36.6 C)] 97.7 F (36.5 C) (06/08 1309) Pulse Rate:  [82-102] 84 (06/08 1309) Resp:  [18-22] 22 (06/08 1309) BP: (101-131)/(64-105) 115/100 (06/08 1309) SpO2:  [92 %-97 %] 95 % (06/08 1309) Weight:  [75.5 kg] 75.5 kg (06/08 0600) Last BM Date : 03/19/24  Weight change: Filed Weights   03/19/24 0500 03/21/24 0500 03/22/24 0600  Weight: 75.4 kg 76.6 kg 75.5 kg    Intake/Output:   Intake/Output Summary (Last 24 hours) at 03/22/2024 1430 Last data filed at 03/22/2024 0830 Gross per 24 hour  Intake 720 ml  Output 1970 ml  Net -1250 ml      Physical Exam    General:  chronically ill appearing. No resp difficulty Cor:  Regular rate & rhythm. No rubs, gallops or murmurs. JVP mildly elevated at 90. 1+ LE edema Lungs: Normal WOB, diminished breath sounds Abdomen: soft, nontender, nondistended Neuro: alert & orientedx3, cranial nerves grossly intact. moves all 4 extremities w/o difficulty. Affect pleasant    Telemetry   Afib in the 90s  Patient Profile   Samuel Porter is a 83 y.o. male with a PMH of squamous cell carcinoma treated with PD-1, HFrEF, afib, HTN who is seen today for evaluation of pulmonary hypertension at the request of Dr. Zelda Hickman.   Assessment/Plan   Pulmonary hypertension: Noted on recent echocardiogram, echo with several concerning features per event right-sided dysfunction including severe TR, worsening RV dilation, abnormal septal flattening that has progressed since March. Etiology unclear. PD-1 can be associated with pulmonary  hypertension, but rare. 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 LV function diminished, but partly due to abnormal septal motion from RV failure. - Continue IV diuresis - Holding metoprolol  with RV dysfunction - Poor SGLT-2 candidate currently - Continue low dose digoxin 0.0625 for RV support - RHC on Monday - End organ function imnproving, will hold on inotropes   Right pleural effusion: Do suspect some cardiac involvement, but his degree of LV dysfunction seems out of proportion and with predominatly RV failure and no significant MR his persistent effusion is not easily explained. - Continue diuresis as above - RHC will help assess PCWP   CKD stage IIIa:  - Diuresis as above, improved today  Squamous cell carcinoma:  - S/p immunotherapy, surgery  Length of Stay: 6  Lauralee Poll, MD  03/22/2024, 2:30 PM  Advanced Heart Failure Team Pager [phone] (M-F; 7a - 5p)  Please contact [facility] for night-coverage after hours (5p -7a ) and weekends on [email or web address]

## 2024-03-23 ENCOUNTER — Inpatient Hospital Stay (HOSPITAL_COMMUNITY)

## 2024-03-23 ENCOUNTER — Encounter (HOSPITAL_COMMUNITY): Payer: Self-pay | Admitting: Cardiology

## 2024-03-23 ENCOUNTER — Encounter (HOSPITAL_COMMUNITY)
Admission: EM | Disposition: A | Discharge status: Home Health | Payer: Self-pay | Source: Ambulatory Visit | Attending: Internal Medicine

## 2024-03-23 DIAGNOSIS — I4819 Other persistent atrial fibrillation: Secondary | ICD-10-CM | POA: Diagnosis not present

## 2024-03-23 DIAGNOSIS — I4891 Unspecified atrial fibrillation: Secondary | ICD-10-CM | POA: Diagnosis not present

## 2024-03-23 DIAGNOSIS — R609 Edema, unspecified: Secondary | ICD-10-CM

## 2024-03-23 DIAGNOSIS — J9601 Acute respiratory failure with hypoxia: Secondary | ICD-10-CM | POA: Diagnosis not present

## 2024-03-23 DIAGNOSIS — I5043 Acute on chronic combined systolic (congestive) and diastolic (congestive) heart failure: Secondary | ICD-10-CM | POA: Diagnosis not present

## 2024-03-23 DIAGNOSIS — J90 Pleural effusion, not elsewhere classified: Secondary | ICD-10-CM | POA: Diagnosis not present

## 2024-03-23 DIAGNOSIS — E8809 Other disorders of plasma-protein metabolism, not elsewhere classified: Secondary | ICD-10-CM | POA: Diagnosis not present

## 2024-03-23 DIAGNOSIS — E46 Unspecified protein-calorie malnutrition: Secondary | ICD-10-CM | POA: Diagnosis not present

## 2024-03-23 DIAGNOSIS — I509 Heart failure, unspecified: Secondary | ICD-10-CM

## 2024-03-23 HISTORY — PX: RIGHT HEART CATH: CATH118263

## 2024-03-23 LAB — BASIC METABOLIC PANEL WITH GFR
Anion gap: 5 (ref 5–15)
Anion gap: 6 (ref 5–15)
BUN: 39 mg/dL — ABNORMAL HIGH (ref 8–23)
BUN: 39 mg/dL — ABNORMAL HIGH (ref 8–23)
CO2: 38 mmol/L — ABNORMAL HIGH (ref 22–32)
CO2: 37 mmol/L — ABNORMAL HIGH (ref 22–32)
Calcium: 8.8 mg/dL — ABNORMAL LOW (ref 8.9–10.3)
Calcium: 8.6 mg/dL — ABNORMAL LOW (ref 8.9–10.3)
Chloride: 94 mmol/L — ABNORMAL LOW (ref 98–111)
Chloride: 93 mmol/L — ABNORMAL LOW (ref 98–111)
Creatinine, Ser: 1.14 mg/dL (ref 0.61–1.24)
Creatinine, Ser: 1.03 mg/dL (ref 0.61–1.24)
GFR, Estimated: >60 mL/min (ref >60)
GFR, Estimated: >60 mL/min (ref >60)
Glucose, Bld: 102 mg/dL — ABNORMAL HIGH (ref 70–99)
Glucose, Bld: 100 mg/dL — ABNORMAL HIGH (ref 70–99)
Potassium: 4.5 mmol/L (ref 3.5–5.1)
Potassium: 3.9 mmol/L (ref 3.5–5.1)
Sodium: 137 mmol/L (ref 135–145)
Sodium: 136 mmol/L (ref 135–145)

## 2024-03-23 LAB — CBC WITH DIFFERENTIAL/PLATELET
Abs Immature Granulocytes: 0.04 10*3/uL (ref 0.00–0.07)
Basophils Absolute: 0 10*3/uL (ref 0.0–0.1)
Basophils Relative: 0 %
Eosinophils Absolute: 0.1 10*3/uL (ref 0.0–0.5)
Eosinophils Relative: 2 %
HCT: 44 % (ref 39.0–52.0)
Hemoglobin: 13.5 g/dL (ref 13.0–17.0)
Immature Granulocytes: 1 %
Lymphocytes Relative: 10 %
Lymphs Abs: 0.6 10*3/uL — ABNORMAL LOW (ref 0.7–4.0)
MCH: 29 pg (ref 26.0–34.0)
MCHC: 30.7 g/dL (ref 30.0–36.0)
MCV: 94.4 fL (ref 80.0–100.0)
Monocytes Absolute: 0.9 10*3/uL (ref 0.1–1.0)
Monocytes Relative: 16 %
Neutro Abs: 4 10*3/uL (ref 1.7–7.7)
Neutrophils Relative %: 71 %
Platelets: 153 10*3/uL (ref 150–400)
RBC: 4.66 MIL/uL (ref 4.22–5.81)
RDW: 14.2 % (ref 11.5–15.5)
WBC: 5.6 10*3/uL (ref 4.0–10.5)
nRBC: 0 % (ref 0.0–0.2)

## 2024-03-23 LAB — POCT I-STAT EG7
Acid-Base Excess: 11 mmol/L — ABNORMAL HIGH (ref 0.0–2.0)
Acid-Base Excess: 11 mmol/L — ABNORMAL HIGH (ref 0.0–2.0)
Bicarbonate: 39.7 mmol/L — ABNORMAL HIGH (ref 20.0–28.0)
Bicarbonate: 39.9 mmol/L — ABNORMAL HIGH (ref 20.0–28.0)
Calcium, Ion: 1.23 mmol/L (ref 1.15–1.40)
Calcium, Ion: 1.23 mmol/L (ref 1.15–1.40)
HCT: 44 % (ref 39.0–52.0)
HCT: 44 % (ref 39.0–52.0)
Hemoglobin: 15 g/dL (ref 13.0–17.0)
Hemoglobin: 15 g/dL (ref 13.0–17.0)
O2 Saturation: 67 %
O2 Saturation: 69 %
Potassium: 3.9 mmol/L (ref 3.5–5.1)
Potassium: 3.9 mmol/L (ref 3.5–5.1)
Sodium: 137 mmol/L (ref 135–145)
Sodium: 138 mmol/L (ref 135–145)
TCO2: 42 mmol/L — ABNORMAL HIGH (ref 22–32)
TCO2: 42 mmol/L — ABNORMAL HIGH (ref 22–32)
pCO2, Ven: 68.5 mmHg — ABNORMAL HIGH (ref 44–60)
pCO2, Ven: 68.7 mmHg — ABNORMAL HIGH (ref 44–60)
pH, Ven: 7.37 (ref 7.25–7.43)
pH, Ven: 7.373 (ref 7.25–7.43)
pO2, Ven: 38 mmHg (ref 32–45)
pO2, Ven: 39 mmHg (ref 32–45)

## 2024-03-23 LAB — APTT: aPTT: 92 s — ABNORMAL HIGH (ref 24–36)

## 2024-03-23 LAB — HEPARIN LEVEL (UNFRACTIONATED): Heparin Unfractionated: >1.1 [IU]/mL — ABNORMAL HIGH (ref 0.30–0.70)

## 2024-03-23 LAB — MAGNESIUM: Magnesium: 2.2 mg/dL (ref 1.7–2.4)

## 2024-03-23 LAB — SEDIMENTATION RATE: Sed Rate: 20 mm/h — ABNORMAL HIGH (ref 0–16)

## 2024-03-23 LAB — CYCLIC CITRUL PEPTIDE ANTIBODY, IGG/IGA: CCP Antibodies IgG/IgA: 9 U (ref 0–19)

## 2024-03-23 SURGERY — RIGHT HEART CATH
Anesthesia: LOCAL

## 2024-03-23 MED ORDER — APIXABAN 5 MG PO TABS
5.0000 mg | ORAL_TABLET | ORAL | Status: AC
  Administered 2024-03-23: 5 mg via ORAL
  Filled 2024-03-23: qty 1

## 2024-03-23 MED ORDER — LIDOCAINE HCL (PF) 1 % IJ SOLN
INTRAMUSCULAR | Status: AC
  Filled 2024-03-23: qty 30

## 2024-03-23 MED ORDER — APIXABAN 5 MG PO TABS
5.0000 mg | ORAL_TABLET | Freq: Two times a day (BID) | ORAL | Status: AC
  Administered 2024-03-24: 5 mg via ORAL
  Filled 2024-03-23: qty 1

## 2024-03-23 MED ORDER — APIXABAN 5 MG PO TABS
5.0000 mg | ORAL_TABLET | Freq: Two times a day (BID) | ORAL | Status: AC
  Administered 2024-03-23: 5 mg via ORAL
  Filled 2024-03-23: qty 1

## 2024-03-23 MED ORDER — FUROSEMIDE 10 MG/ML IJ SOLN
80.0000 mg | Freq: Two times a day (BID) | INTRAMUSCULAR | Status: DC
  Administered 2024-03-23: 80 mg via INTRAVENOUS
  Filled 2024-03-23: qty 8

## 2024-03-23 MED ORDER — LIDOCAINE HCL (PF) 1 % IJ SOLN
INTRAMUSCULAR | Status: DC | PRN
  Administered 2024-03-23: 2 mL

## 2024-03-23 MED ORDER — DAPAGLIFLOZIN PROPANEDIOL 10 MG PO TABS
10.0000 mg | ORAL_TABLET | Freq: Every day | ORAL | Status: DC
  Administered 2024-03-23 – 2024-03-27 (×5): 10 mg via ORAL
  Filled 2024-03-23 (×5): qty 1

## 2024-03-23 MED ORDER — APIXABAN 5 MG PO TABS
5.0000 mg | ORAL_TABLET | Freq: Two times a day (BID) | ORAL | Status: DC
  Administered 2024-03-24 – 2024-03-27 (×6): 5 mg via ORAL
  Filled 2024-03-23 (×6): qty 1

## 2024-03-23 MED ORDER — HEPARIN (PORCINE) IN NACL 1000-0.9 UT/500ML-% IV SOLN
INTRAVENOUS | Status: DC | PRN
  Administered 2024-03-23: 500 mL

## 2024-03-23 SURGICAL SUPPLY — 6 items
CATH BALLN WEDGE 5F 110CM (CATHETERS) IMPLANT
PACK CARDIAC CATHETERIZATION (CUSTOM PROCEDURE TRAY) ×1 IMPLANT
SHEATH GLIDE SLENDER 4/5FR (SHEATH) IMPLANT
STATION PROTECTION PRESSURIZED (MISCELLANEOUS) IMPLANT
TRANSDUCER W/STOPCOCK (MISCELLANEOUS) IMPLANT
TUBING ART PRESS 72 MALE/FEM (TUBING) IMPLANT

## 2024-03-23 NOTE — Interval H&P Note (Signed)
 History and Physical Interval Note:  03/23/2024 10:26 AM  Samuel Porter  has presented today for surgery, with the diagnosis of Pulmonary hypertension.  The various methods of treatment have been discussed with the patient and family. After consideration of risks, benefits and other options for treatment, the patient has consented to  Procedure(s): RIGHT HEART CATH (N/A) as a surgical intervention.  The patient's history has been reviewed, patient examined, no change in status, stable for surgery.  I have reviewed the patient's chart and labs.  Questions were answered to the patient's satisfaction.     Undra Trembath [employer]

## 2024-03-23 NOTE — Progress Notes (Signed)
 Patient ID: Samuel Porter, male   DOB: December 29, 1940, 83 y.o.   MRN: [id number]     Advanced Heart Failure Rounding Note  PCP-Cardiologist: Arnoldo Lapping, MD   Subjective:    Chest tube removed yesterday.  I/Os net negaive 1717 with 1 dose of IV Lasix .  Weight trending down.    He remains in atrial fibrillation rate 80s.     Objective:   Weight Range: 73.2 kg Body mass index is 21.29 kg/m.   Vital Signs:   Temp:  [97.5 F (36.4 C)-97.9 F (36.6 C)] 97.5 F (36.4 C) (06/09 0400) Pulse Rate:  [72-102] 72 (06/09 0400) Resp:  [11-22] 11 (06/09 0400) BP: (105-131)/(67-105) 105/67 (06/09 0400) SpO2:  [92 %-99 %] 97 % (06/09 0400) Weight:  [73.2 kg] 73.2 kg (06/08 1713) Last BM Date : 03/19/24  Weight change: Filed Weights   03/21/24 0500 03/22/24 0600 03/22/24 1713  Weight: 76.6 kg 75.5 kg 73.2 kg    Intake/Output:   Intake/Output Summary (Last 24 hours) at 03/23/2024 0741 Last data filed at 03/23/2024 0600 Gross per 24 hour  Intake 433.17 ml  Output 2150 ml  Net -1716.83 ml   MEDICATIONS   (feeding supplement) PROSource Plus  30 mL Oral BID BM   amiodarone   200 mg Oral Daily   cholecalciferol   1,000 Units Oral Daily   digoxin  0.0625 mg Oral Daily   docusate sodium   100 mg Oral BID   dorzolamide -timolol   1 drop Both Eyes BID   levothyroxine   50 mcg Oral Q0600   midodrine   10 mg Oral TID WC     Physical Exam    General: NAD, thin Neck: JVP 12 cm, no thyromegaly or thyroid  nodule.  Lungs: Decreased BS right base.  CV: Nondisplaced PMI.  Heart irregular S1/S2, no S3/S4, no murmur.  1+ edema 1/3 to knees.  Abdomen: Soft, nontender, no hepatosplenomegaly, no distention.  Skin: Intact without lesions or rashes.  Neurologic: Alert and oriented x 3.  Psych: Normal affect. Extremities: No clubbing or cyanosis.  HEENT: Normal.   Telemetry   Afib in the 90s (personally reviewed)  Patient Profile   Samuel Porter is a 83 y.o. male with a PMH of squamous cell  carcinoma treated with PD-1, HFrEF, afib, HTN who is seen today for evaluation of pulmonary hypertension at the request of Dr. Zelda Hickman.   Assessment/Plan   1. 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  Echo this admission with EF 30-35%, moderate RV enlargement/severe RV dysfunction with D-shaped septum, PASP 70 mmHg.  Etiology unclear. PD-1 blocker (cemiplimab ) can be associated with pulmonary hypertension, but rare. 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 Decreased LV systolic function may be due in part to septal shift/RV dysfunction and in part due to atrial fibrillation.  Troponin minimally elevated with no trend.  On exam, he still looks volume overloaded.  I/Os negative yesterday with Lasix  80 mg IV x 1.  GDMT limited by midodrine  use. Creatinine 1.06 yesterday, pending BMET.  - RHC today to assess filling pressures.  Discussed risks/benefits with patient and he agrees to procedure.  - Remains on midodrine  to maintain MAP.  - Lasix  80 mg IV bid today, can adjust based on RHC.  - Holding metoprolol  with RV dysfunction - Can add Farxiga  10 mg daily.  - Continue low dose digoxin 0.0625 for RV support - Aim for DCCV prior to discharge after full diuresis,  cardiac function may improve in NSR.  2.  Right pleural effusion: Chronic/recurrent.  Multiple thoracenteses, fluid cytology has been negative.  Now with trapped lung. Do suspect some cardiac involvement, but his degree of LV dysfunction seems out of proportion and with predominatly RV failure and no significant MR his persistent effusion is not easily explained.  Right chest tube removed today.  - Continue diuresis as above - RHC will help assess PCWP 3. 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  Pleural fluid cytology has been negative. S/p immunotherapy (cemiplimab ).  4. Atrial fibrillation: Persistent.  DCCV in 5/25 but recurred.   In AF rate 90s this morning.  - Continue amiodarone  200 mg daily.  Ultimately not a great long-term medication for him with his pulmonary problems.  - Continue heparin  gtt, eventually back to DOAC.  - Would aim for DCCV this admission prior to discharge.  5. Pulmonary: Right recurrent pleural effusion as above with trapped lung.  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   Length of Stay: 7  [name], MD  03/23/2024, 7:41 AM  Advanced Heart Failure Team Pager [phone] (M-F; 7a - 5p)  Please contact [facility] for night-coverage after hours (5p -7a ) and weekends on [email or web address]

## 2024-03-23 NOTE — H&P (View-Only) (Signed)
 Patient ID: Samuel Porter, male   DOB: December 29, 1940, 83 y.o.   MRN: [id number]     Advanced Heart Failure Rounding Note  PCP-Cardiologist: Arnoldo Lapping, MD   Subjective:    Chest tube removed yesterday.  I/Os net negaive 1717 with 1 dose of IV Lasix .  Weight trending down.    He remains in atrial fibrillation rate 80s.     Objective:   Weight Range: 73.2 kg Body mass index is 21.29 kg/m.   Vital Signs:   Temp:  [97.5 F (36.4 C)-97.9 F (36.6 C)] 97.5 F (36.4 C) (06/09 0400) Pulse Rate:  [72-102] 72 (06/09 0400) Resp:  [11-22] 11 (06/09 0400) BP: (105-131)/(67-105) 105/67 (06/09 0400) SpO2:  [92 %-99 %] 97 % (06/09 0400) Weight:  [73.2 kg] 73.2 kg (06/08 1713) Last BM Date : 03/19/24  Weight change: Filed Weights   03/21/24 0500 03/22/24 0600 03/22/24 1713  Weight: 76.6 kg 75.5 kg 73.2 kg    Intake/Output:   Intake/Output Summary (Last 24 hours) at 03/23/2024 0741 Last data filed at 03/23/2024 0600 Gross per 24 hour  Intake 433.17 ml  Output 2150 ml  Net -1716.83 ml   MEDICATIONS   (feeding supplement) PROSource Plus  30 mL Oral BID BM   amiodarone   200 mg Oral Daily   cholecalciferol   1,000 Units Oral Daily   digoxin  0.0625 mg Oral Daily   docusate sodium   100 mg Oral BID   dorzolamide -timolol   1 drop Both Eyes BID   levothyroxine   50 mcg Oral Q0600   midodrine   10 mg Oral TID WC     Physical Exam    General: NAD, thin Neck: JVP 12 cm, no thyromegaly or thyroid  nodule.  Lungs: Decreased BS right base.  CV: Nondisplaced PMI.  Heart irregular S1/S2, no S3/S4, no murmur.  1+ edema 1/3 to knees.  Abdomen: Soft, nontender, no hepatosplenomegaly, no distention.  Skin: Intact without lesions or rashes.  Neurologic: Alert and oriented x 3.  Psych: Normal affect. Extremities: No clubbing or cyanosis.  HEENT: Normal.   Telemetry   Afib in the 90s (personally reviewed)  Patient Profile   Samuel Porter is a 83 y.o. male with a PMH of squamous cell  carcinoma treated with PD-1, HFrEF, afib, HTN who is seen today for evaluation of pulmonary hypertension at the request of Dr. Zelda Hickman.   Assessment/Plan   1. 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  Echo this admission with EF 30-35%, moderate RV enlargement/severe RV dysfunction with D-shaped septum, PASP 70 mmHg.  Etiology unclear. PD-1 blocker (cemiplimab ) can be associated with pulmonary hypertension, but rare. 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 Decreased LV systolic function may be due in part to septal shift/RV dysfunction and in part due to atrial fibrillation.  Troponin minimally elevated with no trend.  On exam, he still looks volume overloaded.  I/Os negative yesterday with Lasix  80 mg IV x 1.  GDMT limited by midodrine  use. Creatinine 1.06 yesterday, pending BMET.  - RHC today to assess filling pressures.  Discussed risks/benefits with patient and he agrees to procedure.  - Remains on midodrine  to maintain MAP.  - Lasix  80 mg IV bid today, can adjust based on RHC.  - Holding metoprolol  with RV dysfunction - Can add Farxiga  10 mg daily.  - Continue low dose digoxin 0.0625 for RV support - Aim for DCCV prior to discharge after full diuresis,  cardiac function may improve in NSR.  2.  Right pleural effusion: Chronic/recurrent.  Multiple thoracenteses, fluid cytology has been negative.  Now with trapped lung. Do suspect some cardiac involvement, but his degree of LV dysfunction seems out of proportion and with predominatly RV failure and no significant MR his persistent effusion is not easily explained.  Right chest tube removed today.  - Continue diuresis as above - RHC will help assess PCWP 3. 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  Pleural fluid cytology has been negative. S/p immunotherapy (cemiplimab ).  4. Atrial fibrillation: Persistent.  DCCV in 5/25 but recurred.   In AF rate 90s this morning.  - Continue amiodarone  200 mg daily.  Ultimately not a great long-term medication for him with his pulmonary problems.  - Continue heparin  gtt, eventually back to DOAC.  - Would aim for DCCV this admission prior to discharge.  5. Pulmonary: Right recurrent pleural effusion as above with trapped lung.  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   Length of Stay: 7  [name], MD  03/23/2024, 7:41 AM  Advanced Heart Failure Team Pager [phone] (M-F; 7a - 5p)  Please contact [facility] for night-coverage after hours (5p -7a ) and weekends on [email or web address]

## 2024-03-23 NOTE — Anesthesia Preprocedure Evaluation (Signed)
 Anesthesia Evaluation  Patient identified by MRN, date of birth, ID band Patient awake    Reviewed: Allergy & Precautions, NPO status , Patient's Chart, lab work & pertinent test results, reviewed documented beta blocker date and time   Airway Mallampati: II  TM Distance: >3 FB     Dental  (+) Poor Dentition, Dental Advisory Given   Pulmonary former smoker Hx/o bilateral pleural effusions s/p chest tubes   breath sounds clear to auscultation + decreased breath sounds      Cardiovascular hypertension, Pt. on medications and Pt. on home beta blockers pulmonary hypertension+CHF   Rhythm:Irregular Rate:Normal  Echo 03/20/24  1. Left ventricular ejection fraction, by estimation, is 30 to 35%. The  left ventricle has moderately decreased function. The left ventricle  demonstrates global hypokinesis. The left ventricular internal cavity size  was mildly dilated. Left ventricular  diastolic parameters are indeterminate.   2. Abnormal septal motion and septal flattening suggesting significant  pulmnary HTN. Right ventricular systolic function is severely reduced. The  right ventricular size is moderately enlarged. Moderately increased right  ventricular wall thickness. There   is severely elevated pulmonary artery systolic pressure.   3. Left atrial size was moderately dilated.   4. Right atrial size was severely dilated.   5. The mitral valve is abnormal. Mild mitral valve regurgitation. No  evidence of mitral stenosis.   6. Tricuspid valve regurgitation is moderate.   7. The aortic valve is tricuspid. Aortic valve regurgitation is not  visualized. No aortic stenosis is present.   8. The inferior vena cava is dilated in size with <50% respiratory  variability, suggesting right atrial pressure of 15 mmHg.   Cardiac Cath 03/23/24 1. Normal left and right heart filling pressures.  2. Borderline elevated PA pressure.  3. Preserved cardiac  output.    EKG 6225 Atrial fibrillation Nonspecific IVCD with LAD Probable anteroseptal infarct, old   Neuro/Psych Glaucoma  negative psych ROS   GI/Hepatic negative GI ROS, Neg liver ROS,,,  Endo/Other  diabetes, Well Controlled, Type 2, Oral Hypoglycemic Agents    Renal/GU negative Renal ROS   BPH    Musculoskeletal  (+) Arthritis , Osteoarthritis,  Hx/o melanoma right eyebrow   Abdominal   Peds  Hematology Eliquis  therapy- last dose   Anesthesia Other Findings   Reproductive/Obstetrics ED                             Anesthesia Physical Anesthesia Plan  ASA: 4  Anesthesia Plan: General   Post-op Pain Management: Minimal or no pain anticipated   Induction: Intravenous  PONV Risk Score and Plan: 3 and Treatment may vary due to age or medical condition and Propofol  infusion  Airway Management Planned: Natural Airway and Nasal Cannula  Additional Equipment: None  Intra-op Plan:   Post-operative Plan:   Informed Consent: I have reviewed the patients History and Physical, chart, labs and discussed the procedure including the risks, benefits and alternatives for the proposed anesthesia with the patient or authorized representative who has indicated his/her understanding and acceptance.     Dental advisory given  Plan Discussed with: Anesthesiologist and CRNA  Anesthesia Plan Comments:         Anesthesia Quick Evaluation

## 2024-03-23 NOTE — TOC Progression Note (Signed)
 Transition of Care [facility]) - Progression Note    Patient Details  Name: Samuel Porter MRN: [id number] Date of Birth: December 26, 1940  Transition of Care [facility]) CM/SW Contact  Ernst Heap Phone Number: [phone] 03/23/2024, 1:41 PM  Clinical Narrative:  1:33 PM- HF CSW called and spoke with the patients daughter over the phone who was in the room with the patient. CSW explained that SNF process. Patient stated that he would like for [facility] to be his first choice.   CSW faxed out the patient. Offers pending.   TOC will continue following.      Barriers to Discharge: Continued Medical Work up, Conservator, museum/gallery and Services                                               Social Determinants of Health (SDOH) Interventions SDOH Screenings   Food Insecurity: Patient Declined (03/17/2024)  Housing: Low Risk  (11/30/2022)  Transportation Needs: No Transportation Needs (02/04/2024)   Received from [facility]  Utilities: Low Risk  (02/04/2024)   Received from [facility]  Depression (PHQ2-9): Low Risk  (11/30/2022)  Tobacco Use: Medium Risk (03/18/2024)    Readmission Risk Interventions     No data to display

## 2024-03-23 NOTE — Progress Notes (Signed)
 PROGRESS NOTE                                                                                                                                                                                                             Patient Demographics:    Samuel Porter, is a 83 y.o. male, DOB - 1941/03/19, NUU:[id number]  Outpatient Primary MD for the patient is [name], MD    LOS - 7  Admit date - 03/16/2024    Chief Complaint  Patient presents with   Shortness of Breath       Brief Narrative (HPI from H&P)   83 y.o. male with medical history significant for but not limited to BPH, glaucoma, hypertension, vitamin D  deficiency, atrial fibrillation on anticoagulation with recent cardioversion about 2 weeks ago, history of pleural effusions and other comorbidities who presents with worsening shortness of breath last 3 days.  Patient states that he started developing lower extremity edema that progressively got worse as well as worsening shortness of breath that started 3 days ago.  He states that he was at home and is monitoring his O2 saturations and his O2 saturation for 3 days were about 95% on room air and subsequently continued to drop over the 3 days.  Patient went to go see his pulmonologist today and is found to be hypoxic on room air so they directly admitted him to hospital for further evaluation.   In the ER workup consistent with A-fib RVR, acute on chronic diastolic CHF, recurrent pleural effusion right more than left, he also had hypoxic respiratory failure and was admitted to the hospital.     Subjective:   Patient in bed, appears comfortable, denies any headache, no fever, no chest pain or pressure, no shortness of breath , no abdominal pain. No focal weakness.   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   Patient currently on 3 to 4 L nasal cannula oxygen, pulmonary on board underwent right-sided chest tube placement on 03/18/2024 chest tube was discontinued by pulmonary critical care on 03/22/2024 evening, he has lung entrapment syndrome which appears like a pneumothorax but it is not, pleural fluid LDH is higher than  serum suggesting this likely is exudative collection.  Follow pleural fluid cytology initial cytology does not show any malignant cells as of 03/23/2024, case discussed with pulmonary critical care, upon PCCM request cardiothoracic surgery also consulted who do not think patient is a good candidate for biopsy, PCCM considering Pleurx catheter placement if pleural effusion reoccurs, CHF team following as well and contemplating right heart catheterization on 03/23/2024.    Continue supplemental oxygen, up in chair with I-S and flutter valve for pulmonary toiletry, for management of pulmonary issues to PCCM team.  Home Eliquis  resumed 03/19/2024.   Paroxysmal A-fib and RVR, acute on chronic systolic heart failure EF 35%, reduced RV systolic function as well.  Currently on amiodarone  drip for rate control, Eliquis  as above, cardiology consulted, will defer cardiac issues to the cardiology team.  Monitor on telemetry.  Low-dose beta-blocker if tolerated by blood pressure, blood pressure and renal function precludes the use of ACE/ARB or Entresto.  Repeat limited echo done 03/20/2024 noted with low EF of 35% along with elevated reduced RV systolic function and increased RV pressures likely due to underlying pulmonary hypertension.  CHF team following contemplating right heart catheterization on 03/23/2024.  Recurrent effusion right more than left.  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  Likely will be discharged home on Pleurx catheter.  Case discussed with pulmonary on 03/22/2024.   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  Elevated troponin: Mildly elevated, trend flat and in non-ACS pattern, this was in setting of A-fib with RVR and AKI. Chest pain-free.  Cardiology on board.   AKI.  Nephrology consulted, diuretic dose being managed by CHF and nephrology team.  Currently close to euvolemic.  Glaucoma: Continue home eyedrops.   BPH: Currently holding his home finasteride    Essential hypertension/currently hypotensive: Continue blood pressure low, hold beta-blocker, midodrine  added, albumin , Prosource, as needed IV hydralazine , monitor.  TSH is extremely high started on Synthroid , stable random cortisol.   Hypoalbuminemia: Patient's Albumin  Level is now 2.6. CTM and Trend and repeat CMP in the AM, placed on Prosource.   Hypothyroidism.  TSH greater than 25 this was the day after admission, free T4 0.71, low-dose Synthroid  and monitor.  Now amiodarone  has been added hands outpatient should be monitored by endocrinologist.       Condition - Extremely Guarded  Family Communication  : Daughter bedside 03/17/2024, daughter over the phone 03/19/2024, son bedside 03/20/2024  Code Status : Full code  Consults  : Pulmonary, cardiology, nephrology, cardiothoracic surgery  PUD Prophylaxis :     Procedures  :     Possible right heart catheterization on 03/23/2024 by CHF team.    Right-sided chest tube placement by pulmonary critical care on 03/18/2024, chest tube discontinued 03/22/2024 evening  TTE -    1. Left ventricular ejection fraction, by estimation, is 30 to 35%. The left ventricle has moderately decreased function. The left ventricle  demonstrates global hypokinesis. The  left ventricular internal cavity size was mildly dilated. Left ventricular diastolic parameters are indeterminate.   2. Abnormal septal motion and septal flattening suggesting significant pulmonary HTN. Right ventricular systolic function is severely reduced. The  right  ventricular size is moderately enlarged. Moderately increased right ventricular wall thickness. There is severely elevated pulmonary artery systolic pressure.   3. Left atrial size was moderately dilated.   4. Right atrial size was severely dilated.   5. The mitral valve is abnormal. Mild mitral valve regurgitation. No evidence of mitral stenosis.   6. Tricuspid valve regurgitation is moderate.   7. The aortic valve is tricuspid. Aortic valve regurgitation is not visualized. No aortic stenosis is present.   8. The inferior vena cava is dilated in size with <50% respiratory variability, suggesting right atrial pressure of 15 mmHg.       Disposition Plan  :    Status is: Inpatient   DVT Prophylaxis  :    Place TED hose Start: 03/17/24 1010 SCDs Start: 03/16/24 1742   Lab Results  Component Value Date   PLT 153 03/23/2024    Diet :  Diet Order             Diet NPO time specified Except for: Sips with Meds  Diet effective midnight                    Inpatient Medications  Scheduled Meds:  (feeding supplement) PROSource Plus  30 mL Oral BID BM   amiodarone   200 mg Oral Daily   cholecalciferol   1,000 Units Oral Daily   digoxin  0.0625 mg Oral Daily   docusate sodium   100 mg Oral BID   dorzolamide -timolol   1 drop Both Eyes BID   levothyroxine   50 mcg Oral Q0600   midodrine   10 mg Oral TID WC   Continuous Infusions:  sodium chloride  10 mL/hr at 03/23/24 0526   heparin  1,200 Units/hr (03/22/24 2151)    PRN Meds:.acetaminophen  **OR** acetaminophen , albuterol , bisacodyl , hydrALAZINE , ondansetron  **OR** ondansetron  (ZOFRAN ) IV, oxyCODONE , polyethylene glycol  Antibiotics  :    Anti-infectives (From  admission, onward)    None         Objective:   Vitals:   03/22/24 1713 03/22/24 2000 03/23/24 0000 03/23/24 0400  BP:  128/70 109/78 105/67  Pulse:  86 91 72  Resp:  18 14 11   Temp:  97.7 F (36.5 C) 97.8 F (36.6 C) (!) 97.5 F (36.4 C)  TempSrc:  Oral Oral Oral  SpO2:  92% 99% 97%  Weight: 73.2 kg     Height:        Wt Readings from Last 3 Encounters:  03/22/24 73.2 kg  03/16/24 75.6 kg  03/05/24 73 kg     Intake/Output Summary (Last 24 hours) at 03/23/2024 0742 Last data filed at 03/23/2024 0600 Gross per 24 hour  Intake 433.17 ml  Output 2150 ml  Net -1716.83 ml     Physical Exam  Awake Alert, No new F.N deficits, Normal affect [place].AT,PERRAL Supple Neck, No JVD,   Symmetrical Chest wall movement, Good air movement bilaterally, few crackles,   RRR,No Gallops,Rubs or new Murmurs,  +ve B.Sounds, Abd Soft, No tenderness,   Trace edema     Data Review:    Recent Labs  Lab 03/19/24 0427 03/20/24 0614 03/21/24 0548 03/22/24 0531 03/23/24 0626  WBC 6.7 6.2 7.1 7.2 5.6  HGB 12.4* 12.6* 12.8* 13.3 13.5  HCT 40.3 40.9 42.2 42.8 44.0  PLT 165 143* 166 155 153  MCV 95.5 93.8 95.0 94.1 94.4  MCH 29.4 28.9 28.8 29.2 29.0  MCHC 30.8 30.8 30.3 31.1 30.7  RDW 14.3 14.5 14.6 14.4 14.2  LYMPHSABS 0.5* 0.4*  0.5* 0.5* 0.6*  MONOABS 1.0 0.9 0.9 0.9 0.9  EOSABS 0.1 0.1 0.1 0.1 0.1  BASOSABS 0.0 0.0 0.0 0.0 0.0    Recent Labs  Lab 03/16/24 1923 03/17/24 0338 03/18/24 0332 03/19/24 0427 03/20/24 0614 03/21/24 0548 03/22/24 0531  NA 139 138 137 135 137  --  136  K 3.7 3.5 3.5 3.4* 4.0  --  4.4  CL 102 103 101 101 102  --  96*  CO2 27 30 29 28 28   --  31  ANIONGAP 10 5 7 6 7   --  9  GLUCOSE 123* 91 101* 112* 101*  --  86  BUN 33* 38* 48* 42* 42*  --  37*  CREATININE 1.27* 1.58* 1.34* 1.34* 1.32*  --  1.06  AST 21 16  --   --   --   --   --   ALT 19 17  --   --   --   --   --   ALKPHOS 100 83  --   --   --   --   --   BILITOT 1.1 0.8  --   --   --    --   --   ALBUMIN  2.6* 2.3*  --   --   --   --   --   CRP  --   --  3.9* 3.5* 3.9* 4.1*  --   PROCALCITON  --   --  <0.10 <0.10 <0.10 <0.10 <0.10  TSH  --  27.131*  --   --   --   --   --   BNP 1,203.9*  --  1,029.3* 1,086.3* 1,050.7* 1,089.6*  --   MG 2.3 2.2 2.2 2.1 2.1  --   --   PHOS 4.4 4.8* 4.5 3.3 2.9 2.7  --   CALCIUM 8.6* 8.3* 8.7* 8.5* 8.7*  --  8.4*      Recent Labs  Lab 03/16/24 1923 03/17/24 0338 03/18/24 0332 03/19/24 0427 03/20/24 0614 03/21/24 0548 03/22/24 0531  CRP  --   --  3.9* 3.5* 3.9* 4.1*  --   PROCALCITON  --   --  <0.10 <0.10 <0.10 <0.10 <0.10  TSH  --  27.131*  --   --   --   --   --   BNP 1,203.9*  --  1,029.3* 1,086.3* 1,050.7* 1,089.6*  --   MG 2.3 2.2 2.2 2.1 2.1  --   --   CALCIUM 8.6* 8.3* 8.7* 8.5* 8.7*  --  8.4*     Micro Results No results found for this or any previous visit (from the past 240 hours).  Radiology Report No results found.    Signature  -   Lynnwood Sauer M.D on 03/23/2024 at 7:42 AM   -  To page go to [email or web address]

## 2024-03-23 NOTE — Progress Notes (Signed)
 SLP Cancellation Note  [name] MRN: [id number] DOB: 10/18/1940   Cancelled treatment:       Reason Eval/Treat Not Completed: Direct orders for an MBS received. Pt is NPO for planned heart cath today. SLP will f/u to complete an MBS post-procedure as scheduling allows.   Amil Kale, M.A., CCC-SLP Speech Language Pathology, Acute Rehabilitation Services  Secure Chat preferred [phone]  03/23/2024, 9:32 AM

## 2024-03-23 NOTE — Plan of Care (Signed)
  Problem: Health Behavior/Discharge Planning: Goal: Ability to manage health-related needs will improve Outcome: Progressing   Problem: Clinical Measurements: Goal: Ability to maintain clinical measurements within normal limits will improve Outcome: Progressing Goal: Will remain free from infection Outcome: Progressing Goal: Diagnostic test results will improve Outcome: Progressing Goal: Respiratory complications will improve Outcome: Progressing Goal: Cardiovascular complication will be avoided Outcome: Progressing   Problem: Activity: Goal: Risk for activity intolerance will decrease Outcome: Progressing   Problem: Nutrition: Goal: Adequate nutrition will be maintained Outcome: Progressing   Problem: Coping: Goal: Level of anxiety will decrease Outcome: Progressing   Problem: Elimination: Goal: Will not experience complications related to bowel motility Outcome: Progressing Goal: Will not experience complications related to urinary retention Outcome: Progressing   Problem: Pain Managment: Goal: General experience of comfort will improve and/or be controlled Outcome: Progressing   Problem: Safety: Goal: Ability to remain free from injury will improve Outcome: Progressing   Problem: Skin Integrity: Goal: Risk for impaired skin integrity will decrease Outcome: Progressing   Problem: Education: Goal: Ability to demonstrate management of disease process will improve Outcome: Progressing Goal: Ability to verbalize understanding of medication therapies will improve Outcome: Progressing Goal: Individualized Educational Video(s) Outcome: Progressing   Problem: Activity: Goal: Capacity to carry out activities will improve Outcome: Progressing   Problem: Cardiac: Goal: Ability to achieve and maintain adequate cardiopulmonary perfusion will improve Outcome: Progressing   Problem: Education: Goal: Knowledge of disease or condition will improve Outcome:  Progressing Goal: Understanding of medication regimen will improve Outcome: Progressing Goal: Individualized Educational Video(s) Outcome: Progressing   Problem: Activity: Goal: Ability to tolerate increased activity will improve Outcome: Progressing   Problem: Cardiac: Goal: Ability to achieve and maintain adequate cardiopulmonary perfusion will improve Outcome: Progressing   Problem: Health Behavior/Discharge Planning: Goal: Ability to safely manage health-related needs after discharge will improve Outcome: Progressing   Problem: Education: Goal: Understanding of CV disease, CV risk reduction, and recovery process will improve Outcome: Progressing Goal: Individualized Educational Video(s) Outcome: Progressing   Problem: Activity: Goal: Ability to return to baseline activity level will improve Outcome: Progressing   Problem: Cardiovascular: Goal: Ability to achieve and maintain adequate cardiovascular perfusion will improve Outcome: Progressing Goal: Vascular access site(s) Level 0-1 will be maintained Outcome: Progressing   Problem: Health Behavior/Discharge Planning: Goal: Ability to safely manage health-related needs after discharge will improve Outcome: Progressing

## 2024-03-23 NOTE — Progress Notes (Signed)
 Heart Failure Navigator Progress Note  Assessed for Heart & Vascular TOC clinic readiness.  Patient does not meet criteria due to Advanced Heart Failure Team patient of Dr. Elwyn Lade. .   Navigator will sign off at this time.    Rhae Hammock, BSN, Scientist, clinical (histocompatibility and immunogenetics) Only

## 2024-03-23 NOTE — Progress Notes (Signed)
 Bilateral lower extremity venous duplex has been completed. Preliminary results can be found in CV Proc through chart review.   03/23/24 2:43 PM Birda Buffy RVT

## 2024-03-23 NOTE — Progress Notes (Addendum)
 PHARMACY - ANTICOAGULATION CONSULT NOTE  Pharmacy Consult for Heparin  Indication: atrial fibrillation  No Known Allergies  Patient Measurements: Height: 6\' 1"  (185.4 cm) Weight: 73.2 kg (161 lb 6 oz) IBW/kg (Calculated) : 79.9 HEPARIN  DW (KG): 75.3  Vital Signs: Temp: 97.5 F (36.4 C) (06/09 0400) Temp Source: Oral (06/09 0400) BP: 105/67 (06/09 0400) Pulse Rate: 72 (06/09 0400)  Labs: Recent Labs    03/21/24 0548 03/22/24 0531 03/23/24 0626  HGB 12.8* 13.3 13.5  HCT 42.2 42.8 44.0  PLT 166 155 153  APTT  --   --  92*  HEPARINUNFRC  --   --  >1.10*  CREATININE  --  1.06 1.14    Estimated Creatinine Clearance: 51.7 mL/min (by C-G formula based on SCr of 1.14 mg/dL).  Assessment: 83 yo M on apixaban  PTA for afib, held for thoracentesis. Pharmacy consulted for IV heparin .  Eliquis  resumed 6/5, now transitioning back to heparin  in anticipation of cardiac cath 6/9.  Last dose of Eliquis  today at 09:04  aPTT was therapeutic (92 seconds) on 1200 units/hr on 6/6 (Heparin  level high due to effect of apixaban ), so will continue with this rate.  Goal of Therapy:  Heparin  level 0.3-0.7 units/ml aPTT 66-102 seconds Monitor platelets by anticoagulation protocol: Yes   Plan:  Continue heparin  drip at 1200 units/hr Check aPTT and heparin  level in 8hrs F/u anticoag plans after cath today  Sebrina Kessner A. Brynn Caras, PharmD, BCPS, FNKF Clinical Pharmacist [place] Please utilize Amion for appropriate phone number to reach the unit pharmacist [facility])   Addendum: patient's heparin  stopped prior to cath, but restarted in cath prior to arrival back on floor (~1100). Will retime APTT and HL for 8 hours from restart.  Canuto Kingston A. Brynn Caras, PharmD, BCPS, FNKF Clinical Pharmacist [place] Please utilize Amion for appropriate phone number to reach the unit pharmacist [facility])

## 2024-03-24 ENCOUNTER — Inpatient Hospital Stay (HOSPITAL_COMMUNITY): Payer: Self-pay | Admitting: Anesthesiology

## 2024-03-24 ENCOUNTER — Inpatient Hospital Stay (HOSPITAL_COMMUNITY)

## 2024-03-24 ENCOUNTER — Encounter (HOSPITAL_COMMUNITY)
Admission: EM | Disposition: A | Discharge status: Home Health | Payer: Self-pay | Source: Ambulatory Visit | Attending: Internal Medicine

## 2024-03-24 ENCOUNTER — Encounter: Payer: Self-pay | Admitting: Hematology and Oncology

## 2024-03-24 ENCOUNTER — Encounter (HOSPITAL_COMMUNITY): Payer: Self-pay | Admitting: Cardiology

## 2024-03-24 ENCOUNTER — Telehealth (HOSPITAL_COMMUNITY): Payer: Self-pay | Admitting: Pharmacy Technician

## 2024-03-24 ENCOUNTER — Other Ambulatory Visit (HOSPITAL_COMMUNITY): Payer: Self-pay

## 2024-03-24 DIAGNOSIS — Z87891 Personal history of nicotine dependence: Secondary | ICD-10-CM | POA: Diagnosis not present

## 2024-03-24 DIAGNOSIS — J90 Pleural effusion, not elsewhere classified: Secondary | ICD-10-CM | POA: Diagnosis not present

## 2024-03-24 DIAGNOSIS — I509 Heart failure, unspecified: Secondary | ICD-10-CM

## 2024-03-24 DIAGNOSIS — E46 Unspecified protein-calorie malnutrition: Secondary | ICD-10-CM | POA: Diagnosis not present

## 2024-03-24 DIAGNOSIS — I110 Hypertensive heart disease with heart failure: Secondary | ICD-10-CM | POA: Diagnosis not present

## 2024-03-24 DIAGNOSIS — J9601 Acute respiratory failure with hypoxia: Secondary | ICD-10-CM | POA: Diagnosis not present

## 2024-03-24 DIAGNOSIS — I4891 Unspecified atrial fibrillation: Secondary | ICD-10-CM

## 2024-03-24 DIAGNOSIS — E43 Unspecified severe protein-calorie malnutrition: Secondary | ICD-10-CM | POA: Insufficient documentation

## 2024-03-24 HISTORY — PX: CARDIOVERSION: EP1203

## 2024-03-24 LAB — BASIC METABOLIC PANEL WITH GFR
Anion gap: 9 (ref 5–15)
BUN: 42 mg/dL — ABNORMAL HIGH (ref 8–23)
CO2: 35 mmol/L — ABNORMAL HIGH (ref 22–32)
Calcium: 8.5 mg/dL — ABNORMAL LOW (ref 8.9–10.3)
Chloride: 94 mmol/L — ABNORMAL LOW (ref 98–111)
Creatinine, Ser: 1.17 mg/dL (ref 0.61–1.24)
GFR, Estimated: >60 mL/min (ref >60)
Glucose, Bld: 92 mg/dL (ref 70–99)
Potassium: 4.1 mmol/L (ref 3.5–5.1)
Sodium: 138 mmol/L (ref 135–145)

## 2024-03-24 LAB — CBC WITH DIFFERENTIAL/PLATELET
Abs Immature Granulocytes: 0.04 10*3/uL (ref 0.00–0.07)
Basophils Absolute: 0 10*3/uL (ref 0.0–0.1)
Basophils Relative: 0 %
Eosinophils Absolute: 0.1 10*3/uL (ref 0.0–0.5)
Eosinophils Relative: 1 %
HCT: 39 % (ref 39.0–52.0)
Hemoglobin: 12.3 g/dL — ABNORMAL LOW (ref 13.0–17.0)
Immature Granulocytes: 1 %
Lymphocytes Relative: 9 %
Lymphs Abs: 0.5 10*3/uL — ABNORMAL LOW (ref 0.7–4.0)
MCH: 29.5 pg (ref 26.0–34.0)
MCHC: 31.5 g/dL (ref 30.0–36.0)
MCV: 93.5 fL (ref 80.0–100.0)
Monocytes Absolute: 0.9 10*3/uL (ref 0.1–1.0)
Monocytes Relative: 15 %
Neutro Abs: 4.4 10*3/uL (ref 1.7–7.7)
Neutrophils Relative %: 74 %
Platelets: 153 10*3/uL (ref 150–400)
RBC: 4.17 MIL/uL — ABNORMAL LOW (ref 4.22–5.81)
RDW: 14.2 % (ref 11.5–15.5)
WBC: 5.9 10*3/uL (ref 4.0–10.5)
nRBC: 0 % (ref 0.0–0.2)

## 2024-03-24 LAB — MAGNESIUM: Magnesium: 2.2 mg/dL (ref 1.7–2.4)

## 2024-03-24 SURGERY — CARDIOVERSION (CATH LAB)
Anesthesia: General

## 2024-03-24 MED ORDER — ENSURE PLUS HIGH PROTEIN PO LIQD
237.0000 mL | Freq: Three times a day (TID) | ORAL | Status: DC
  Administered 2024-03-24 – 2024-03-27 (×9): 237 mL via ORAL

## 2024-03-24 MED ORDER — EPHEDRINE SULFATE-NACL 50-0.9 MG/10ML-% IV SOSY
PREFILLED_SYRINGE | INTRAVENOUS | Status: DC | PRN
  Administered 2024-03-24: 5 mg via INTRAVENOUS

## 2024-03-24 MED ORDER — MIDODRINE HCL 5 MG PO TABS
5.0000 mg | ORAL_TABLET | Freq: Three times a day (TID) | ORAL | Status: DC
  Administered 2024-03-24 – 2024-03-25 (×4): 5 mg via ORAL
  Filled 2024-03-24 (×4): qty 1

## 2024-03-24 MED ORDER — FUROSEMIDE 20 MG PO TABS
20.0000 mg | ORAL_TABLET | Freq: Every day | ORAL | Status: DC
  Administered 2024-03-24 – 2024-03-27 (×4): 20 mg via ORAL
  Filled 2024-03-24 (×4): qty 1

## 2024-03-24 MED ORDER — PROSOURCE PLUS PO LIQD
30.0000 mL | Freq: Two times a day (BID) | ORAL | Status: DC
  Administered 2024-03-24 – 2024-03-27 (×6): 30 mL via ORAL
  Filled 2024-03-24 (×4): qty 30

## 2024-03-24 MED ORDER — ADULT MULTIVITAMIN W/MINERALS CH
1.0000 | ORAL_TABLET | Freq: Every day | ORAL | Status: DC
  Administered 2024-03-24 – 2024-03-27 (×4): 1 via ORAL
  Filled 2024-03-24 (×4): qty 1

## 2024-03-24 MED ORDER — PROPOFOL 10 MG/ML IV BOLUS
INTRAVENOUS | Status: DC | PRN
  Administered 2024-03-24: 40 mg via INTRAVENOUS

## 2024-03-24 MED ORDER — LIDOCAINE 2% (20 MG/ML) 5 ML SYRINGE
INTRAMUSCULAR | Status: DC | PRN
  Administered 2024-03-24: 50 mg via INTRAVENOUS

## 2024-03-24 SURGICAL SUPPLY — 1 items: PAD DEFIB RADIO PHYSIO CONN (PAD) ×1 IMPLANT

## 2024-03-24 NOTE — Progress Notes (Signed)
 03/24/2024  Seen in f/u for SOB  S: No events, breathing is overall stable,Difficulty with transfers mostly related to weakness, family at bedside  O:    03/24/2024    8:52 AM 03/24/2024    8:15 AM 03/24/2024    8:00 AM  Vitals with BMI  Systolic 115 101 960  Diastolic 63 70 58  Pulse 52  52  Cachectic man in no distress Temporal wasting Garbled speech Reduced breath sounds on right Now in sinus bradycardia after cardioversion  Labs, right heart cath, imaging reviewed  A: Acute on chronic hypoxemic respiratory failure primarily driven by volume overload.  Improved.  Background is unintentional weight loss after head and neck chemoradiation as well as immunotherapy.  Also recent life stressors including death of his wife in 02/28/2024.  Patient did notice loss of taste after using the immunotherapy which may have contributed as well.  Issues with effusion may or may not have developed around the same time as well.  He has required multiple thoracenteses usually about every 3 weeks.  He becomes symptomatic after 2 weeks.  Thoracentesis does relieve his breathing temporarily.  He states his weight has been stable for the past 3 months. 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  This could be from an old aspiration event, immunotherapy type problem, or possibly pleural metastases.  If the latter, this is a terminal process.  Given that his weight loss has stabilized, would be a little less consistent. Atrial fibrillation, presumably part of the driver for fluid overload, status post cardioversion.  Some concern about continuing amiodarone  long-term, his ESR is normal, he does not really have any other great options for rhythm control and if the arrhythmias are driving his inability to maintain euvolemia this may be a  bigger issue than any potential amiodarone  lung toxicity.  We are kind of between a rock and a hard place here.  P: RD consult SLP consult, MBSS may be helpful Family to decide on intermittent thoracenteses versus Pleurx if recurrence of effusion  Given discussions with family I am a little concerned that he is unable to keep up with his caloric needs and may benefit from PEG tube placement. Continue PT and OT efforts Okay with amiodarone  Maintain euvolemia, close outpatient monitoring of fluid status will be needed Should he continue to languish as an outpatient despite attempts at euvolemia, sinus rhythm, pleural space clearance, and assurance of adequate nutrition, then he may be at end-of-life.  They seemed understand this.' We will follow-up with him tomorrow to discuss their decision regarding pleural intervention in the future as well as review nutritional and swallow evaluation   Ardelle Kos MD Pulmonary Critical Care Medicine Securechat if during day (7a-7p) [phone] if after hours (7p-7a)

## 2024-03-24 NOTE — Progress Notes (Signed)
 Physical Therapy Treatment [name] MRN: [id number] DOB: [birth year] Today's Date: 03/24/2024   History of Present Illness 83 year old man presented 6/2 from pulmonary clinic with worsening dyspnea exertion, hypoxemia, lower extremity swelling, and A-fib with RVR concerning for CHF exacerbation with recurrent right greater than left pleural effusion, and AKI.  PMHx includes BPH, glaucoma, hypertension, vitamin D  deficiency, atrial fibrillation on anticoagulation with recent cardioversion ~2 wks ago.    PT Comments  Pt has improved well toward goals.  Still could benefit from continued inpatient follow up therapy, <3 hours/day.  Emphasis on standing exercise, transfers and gait with a rollator while monitoring SpO2 on 1-2 L.      If plan is discharge home, recommend the following: A little help with walking and/or transfers;A little help with bathing/dressing/bathroom;Assistance with cooking/housework;Assist for transportation   Can travel by private vehicle        Equipment Recommendations  None recommended by PT    Recommendations for Other Services       Precautions / Restrictions Precautions Precautions: Fall Recall of Precautions/Restrictions: Intact Precaution/Restrictions Comments: chest tube, noisy SpO2 signal on fingers     Mobility  Bed Mobility               General bed mobility comments: sitting EOB when PT entered room    Transfers Overall transfer level: Needs assistance Equipment used: Rollator (4 wheels) Transfers: Sit to/from Stand Sit to Stand: Contact guard assist           General transfer comment: CGA for safety to stand from edge of bed and chairs  Cues for alignment with LEs and RW touching recliner prior to sitting to maximize safety.    Ambulation/Gait Ambulation/Gait assistance: Supervision Gait Distance (Feet): 360 Feet Assistive device: Rolling walker (2 wheels) Gait Pattern/deviations: Step-through  pattern, Decreased stride length Gait velocity: dec Gait velocity interpretation: 1.31 - 2.62 ft/sec, indicative of limited community ambulator   General Gait Details:  (generally steady with the RW scanning, baching up, abrupt turns, without instability.  SpO2 on 2 L maintained at 91-93.  HR in th mid 90's.  When unable to get a reading, pt's facial color was roses with red lips.)   Stairs             Wheelchair Mobility     Tilt Bed    Modified Rankin (Stroke Patients Only)       Balance Overall balance assessment: Needs assistance Sitting-balance support: No upper extremity supported Sitting balance-Leahy Scale: Good                                      Communication Communication Communication: No apparent difficulties  Cognition Arousal: Alert Behavior During Therapy: WFL for tasks assessed/performed   PT - Cognitive impairments: No apparent impairments                       PT - Cognition Comments: pt sitting up EOB before being instructed to, causing bed alarm to sound. Pt had been told to wait for lines to be arranged first. Following commands: Intact      Cueing Cueing Techniques: Verbal cues  Exercises General Exercises - Lower Extremity Hip ABduction/ADduction: AROM, Both, 10 reps, Standing Hip Flexion/Marching: AROM, Both, 10 reps, Supine Toe Raises: AROM, Both Heel Raises: AROM, Strengthening, Both, 10 reps, Standing Mini-Sqauts: AROM, Strengthening, 10 reps, Supine, Limitations  General Comments        Pertinent Vitals/Pain Pain Assessment Pain Assessment: 0-10 Faces Pain Scale: No hurt Pain Location: 1 Pain Descriptors / Indicators: Discomfort Pain Intervention(s): Monitored during session    Home Living                          Prior Function            PT Goals (current goals can now be found in the care plan section) Acute Rehab PT Goals PT Goal Formulation: With patient Time For Goal  Achievement: 03/31/24 Potential to Achieve Goals: Good Progress towards PT goals: Progressing toward goals    Frequency    Min 2X/week      PT Plan      Co-evaluation              AM-PAC PT "6 Clicks" Mobility   Outcome Measure  Help needed turning from your back to your side while in a flat bed without using bedrails?: None Help needed moving from lying on your back to sitting on the side of a flat bed without using bedrails?: A Little Help needed moving to and from a bed to a chair (including a wheelchair)?: A Little Help needed standing up from a chair using your arms (e.g., wheelchair or bedside chair)?: A Little Help needed to walk in hospital room?: A Little Help needed climbing 3-5 steps with a railing? : A Little 6 Click Score: 19    End of Session   Activity Tolerance: Patient tolerated treatment well Patient left: with call bell/phone within reach;in chair;with chair alarm set;with SCD's reapplied Nurse Communication: Mobility status PT Visit Diagnosis: Other abnormalities of gait and mobility (R26.89);Muscle weakness (generalized) (M62.81)     Time: 1610-9604 PT Time Calculation (min) (ACUTE ONLY): 32 min  Charges:    $Gait Training: 8-22 mins $Therapeutic Exercise: 23-37 mins PT General Charges $$ ACUTE PT VISIT: 1 Visit                     03/24/2024  [name], PT Acute Rehabilitation Services [phone]  (office)   [name] 03/24/2024, 4:05 PM

## 2024-03-24 NOTE — Progress Notes (Signed)
 03/23/24  Seen in f/u for FTT  S: No events, chest tube out, some DOE  O: Remains in Afib +muscle wasting Slightly garbled speech Weak but can transfer Seems oriented Labs ordered and reviewed  A: Recurrent entrapped lung on R with abnormal pleura: not VATS/pleuroscopy candidate Protein calorie malnutrition POA Volume overload POA Afib on AC Abnormal CT NSIP pattern Acute hypoxemic resp failure  P: For cath today Going to do a deeper chart review and see what is driving this FTT, see note 6/10   Ardelle Kos MD Pulmonary Critical Care Medicine Securechat if during day (7a-7p) [phone] if after hours (7p-7a)

## 2024-03-24 NOTE — Procedures (Signed)
 Electrical Cardioversion Procedure Note Samuel Porter [id number] 13-Apr-1941  Procedure: Electrical Cardioversion Indications:  Atrial Fibrillation  Procedure Details Consent: Risks of procedure as well as the alternatives and risks of each were explained to the (patient/caregiver).  Consent for procedure obtained. Time Out: Verified patient identification, verified procedure, site/side was marked, verified correct patient position, special equipment/implants available, medications/allergies/relevent history reviewed, required imaging and test results available.  Performed  Patient placed on cardiac monitor, pulse oximetry, supplemental oxygen as necessary.  Sedation given: Propofol  per anesthesiology Pacer pads placed anterior and posterior chest.  Cardioverted 1 time(s).  Cardioverted at 360J.  Evaluation Findings: Post procedure EKG shows: NSR Complications: None Patient did tolerate procedure well.   Samuel Porter 03/24/2024, 7:36 AM

## 2024-03-24 NOTE — Progress Notes (Signed)
 Patient ID: Samuel Porter, male   DOB: Aug 07, 1941, 83 y.o.   MRN: [id number]     Advanced Heart Failure Rounding Note  PCP-Cardiologist: Arnoldo Lapping, MD   Subjective:    1 dose of IV Lasix  yesterday.  I/Os negative, weight trending down.   DCCV to NSR this morning.   RHC 6/9: Hemodynamics (mmHg) RA mean 4 RV 34/7 PA 34/18, mean 25 PCWP mean 13 Oxygen saturations: PA 68% AO 99% Cardiac Output (Fick) 4.59  Cardiac Index (Fick) 2.34 PVR 2.6 WU     Objective:   Weight Range: 73.2 kg Body mass index is 21.29 kg/m.   Vital Signs:   Temp:  [97.4 F (36.3 C)-98.8 F (37.1 C)] 98.6 F (37 C) (06/10 0716) Pulse Rate:  [0-106] 45 (06/10 0740) Resp:  [12-31] 14 (06/10 0740) BP: (87-144)/(46-116) 87/46 (06/10 0740) SpO2:  [79 %-100 %] 99 % (06/10 0738) Last BM Date : 03/19/24  Weight change: Filed Weights   03/21/24 0500 03/22/24 0600 03/22/24 1713  Weight: 76.6 kg 75.5 kg 73.2 kg    Intake/Output:   Intake/Output Summary (Last 24 hours) at 03/24/2024 0743 Last data filed at 03/24/2024 0500 Gross per 24 hour  Intake 158.18 ml  Output 1425 ml  Net -1266.82 ml   MEDICATIONS   [MAR Hold] (feeding supplement) PROSource Plus  30 mL Oral BID BM   [MAR Hold] amiodarone   200 mg Oral Daily   [MAR Hold] apixaban   5 mg Oral BID   [MAR Hold] cholecalciferol   1,000 Units Oral Daily   [MAR Hold] dapagliflozin  propanediol  10 mg Oral Daily   [MAR Hold] digoxin  0.0625 mg Oral Daily   [MAR Hold] docusate sodium   100 mg Oral BID   [MAR Hold] dorzolamide -timolol   1 drop Both Eyes BID   furosemide   20 mg Oral Daily   [MAR Hold] levothyroxine   50 mcg Oral Q0600   midodrine   5 mg Oral TID WC   BMET    Component Value Date/Time   NA 138 03/24/2024 0510   NA 141 03/12/2024 0917   K 4.1 03/24/2024 0510   CL 94 (L) 03/24/2024 0510   CO2 35 (H) 03/24/2024 0510   GLUCOSE 92 03/24/2024 0510   BUN 42 (H) 03/24/2024 0510   BUN 18 03/12/2024 0917   CREATININE 1.17 03/24/2024  0510   CREATININE 0.91 11/01/2022 0954   CALCIUM 8.5 (L) 03/24/2024 0510   EGFR 70 03/12/2024 0917   GFRNONAA >60 03/24/2024 0510   GFRNONAA >60 11/01/2022 0954     Physical Exam    General: NAD, thin Neck: JVP 8 cm, no thyromegaly or thyroid  nodule.  Lungs: Clear to auscultation bilaterally with normal respiratory effort. CV: Nondisplaced PMI.  Heart irregular S1/S2, no S3/S4, no murmur.  Trace ankle edema.  Abdomen: Soft, nontender, no hepatosplenomegaly, no distention.  Skin: Intact without lesions or rashes.  Neurologic: Alert and oriented x 3.  Psych: Normal affect. Extremities: No clubbing or cyanosis.  HEENT: Normal.    Telemetry   Afib in the 90s => NSR 50s (personally reviewed)  Patient Profile   XADRIAN CRAIGHEAD is a 83 y.o. male with a PMH of squamous cell carcinoma treated with PD-1, HFrEF, afib, HTN who is seen today for evaluation of pulmonary hypertension at the request of Dr. Zelda Hickman.   Assessment/Plan   1. 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  Echo this admission with EF 30-35%,  moderate RV enlargement/severe RV dysfunction with D-shaped septum, PASP 70 mmHg.  Etiology unclear. PD-1 blocker (cemiplimab ) can be associated with pulmonary hypertension, but rare. 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 Decreased LV systolic function may be due in part to septal shift/RV dysfunction and in part due to atrial fibrillation.  Troponin minimally elevated with no trend.  RHC on 6/9 showed normal filling pressures, preserved cardiac output, and only borderline elevated PA pressure.  IV Lasix  was stopped.  GDMT limited by midodrine  use. Creatinine 1.17 today.  - Remains on midodrine  to maintain MAP, will try to wean down to 5 mg tid today and maybe off tomorrow, MAP stable.  - Can start Lasix  20 mg daily.  - Holding metoprolol  with RV dysfunction - Continue Farxiga  10 mg daily.  -  Continue low dose digoxin 0.0625 for RV support.  2.  Right pleural effusion: Chronic/recurrent.  Multiple thoracenteses, fluid cytology has been negative.  Now with trapped lung. Do suspect some cardiac involvement, but his degree of LV dysfunction seems out of proportion and with predominatly RV failure and no significant MR his persistent effusion is not easily explained.  Right chest tube removed. RHC yesterday showed normal filling pressures, do not think that CHF is currently driving his effusion. CXR with hydropneumothorax on right.  3. 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  Pleural fluid cytology has been negative. S/p immunotherapy (cemiplimab ).  4. Atrial fibrillation: Persistent.  DCCV in 5/25 but recurred.  Patient had successful DCCV to NSR this morning.  - Continue amiodarone  200 mg daily.  Ultimately not a great long-term medication for him with his pulmonary problems.  - Now back on apixaban .  5. Pulmonary: Right recurrent pleural effusion as above with trapped lung.  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   Length of Stay: 8  [name], MD  03/24/2024, 7:43 AM  Advanced Heart Failure Team Pager [phone] (M-F; 7a - 5p)  Please contact [facility] for night-coverage after hours (5p -7a ) and weekends on [email or web address]

## 2024-03-24 NOTE — Discharge Instructions (Signed)

## 2024-03-24 NOTE — Interval H&P Note (Signed)
 History and Physical Interval Note:  03/24/2024 7:26 AM  Samuel Porter  has presented today for surgery, with the diagnosis of afib.  The various methods of treatment have been discussed with the patient and family. After consideration of risks, benefits and other options for treatment, the patient has consented to  Procedure(s): CARDIOVERSION (N/A) as a surgical intervention.  The patient's history has been reviewed, patient examined, no change in status, stable for surgery.  I have reviewed the patient's chart and labs.  Questions were answered to the patient's satisfaction.     Nayelli Inglis [employer]

## 2024-03-24 NOTE — Progress Notes (Signed)
 Modified Barium Swallow Study  Patient Details  Name: Samuel Porter MRN: [id number] Date of Birth: Mar 20, 1941  Today's Date: 03/24/2024  Modified Barium Swallow completed.  Full report located under Chart Review in the Imaging Section.  History of Present Illness JOZEF EISENBEIS is an 83 yo male presenting to ED 6/2 with worsening shortness of breath x3 days. Found to have A-fib RVR, acute on chronic diastolic HF, recurrent R>L pleural effusion, and hypoxic respiratory failure. CT Chest shows large, multiloculated R hydropneumothorax with chest tube in place as well as unchanged underlying pulmonary fibrosis but assessment limited by pleural effusions and atelectasis. MBS ordered per MD. PMH includes BPH, glaucoma, HTN, A-fib on anticoagulation with recent cardioversion x2 weeks PTA, history of SCC, unintentional weight loss   Clinical Impression Pt presents with a functional oropharyngeal swallow. No penetration/aspiration occurred throughout the study and there was no significant residue. The 13 mm barium tablet was given with thin liquids and was [facility], without noted esophageal retention or retrograde backflow. Education completed with pt and his daughter. Recommend he continue his current diet without ongoing SLP f/u. Will sign off at this time. Factors that may increase risk of adverse event in presence of aspiration Roderick Civatte & Jessy Morocco 2021): Poor general health and/or compromised immunity;Respiratory or GI disease;Frail or deconditioned  Swallow Evaluation Recommendations Recommendations: PO diet PO Diet Recommendation: Regular;Thin liquids (Level 0) Liquid Administration via: Cup;Straw Medication Administration: Whole meds with liquid Supervision: Patient able to self-feed Swallowing strategies  : Minimize environmental distractions;Slow rate;Small bites/sips Postural changes: Position pt fully upright for meals Oral care recommendations: Oral care BID (2x/day)    Amil Kale, M.A.,  CCC-SLP Speech Language Pathology, Acute Rehabilitation Services  Secure Chat preferred [phone]  03/24/2024,10:29 AM

## 2024-03-24 NOTE — Progress Notes (Addendum)
 Initial Nutrition Assessment  DOCUMENTATION CODES:  Severe malnutrition in context of chronic illness  INTERVENTION:  Initiate 48-hour calorie count to assess current level of intake Continue 30 ml ProSource Plus BID, each supplement provides 100 kcals and 15 grams protein.   Add Ensure Plus High Protein po TID, each supplement provides 350 kcal and 20 grams of protein  Add Magic cup TID with meals, each supplement provides 290 kcal and 9 grams of protein  Add MVI w/ minerals Daily weight to assess trend r/t fluid shifts Education given regarding PEG tube and indications for placement Resources for modular protein supplements provided to purchase at home Monitor for PEG tube placement and ability to initiate tube feedings, if indicated   NUTRITION DIAGNOSIS:  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  GOAL:  Patient will meet greater than or equal to 90% of their needs  MONITOR:  PO intake, Supplement acceptance, Weight trends  REASON FOR ASSESSMENT:   Consult Assessment of nutrition requirement/status  ASSESSMENT:   Pt with PMH significant for: BPH, glaucoma, HTN, vitamin D  deficiency, afib, pleaural effusions. Presented with SOB and lower extremity edema x3 days. Found to have acute respiratory failure w/ hypoxia in setting of afib w/ RVR, acute on chronic CHF (EF 40%), and recurrent bilateral pleural effusions.  6/2 admitted 6/4 R chest tube placed 6/6 limited echo: EF 35% and elevated RV filling pressure + reduced RV systolic function 6/8 R chest tube removed 6/9 RHC 6/10 DC cardioversion, MBS: regular, thins  Critical care consulted and recommending PEG placement d/t patient's malnutrition could be contributing to increased third spacing in pleural space.  Average Meal Intake 6/2: 75% x1 documented meal 6/3: 75% x2  documented meals 6/7: 50% x2 documented meals 6/8: 50% x1 documented meal  Spoke with patient and his daughter at length at bedside today. He lost his wife in May, which has impacted his intake. Has not had adequate intake since he completed chemoradiation d/t sqamous cell cancer to head/neck area. This impacted his taste and ability to eat. Daughter reports prolonged meal times, but no issues with chewing or swallowing foods.   24 Hour Recall B:cereal w/ banana OR scrambled egg/toast, occasionally w/ sausage or bacon, coffee L: 1/2 deli sandwich with water  D: protein, starch, veg Snacks: 1/2 [employer] protein supplement - inconsistent  Discussed a number of topics about patient's nutrition status at bedside with daughter. Discussed indications for nocturnal feeds and the recommendation for PEG tube as he will likely be unable to physically meet his estimated calorie and protein needs by mouth and consistently. She is interested in how much he is taking in. Will initiate calorie count to assess intake. Will need anyway to determine how much of nocturnal feedings will be indicated to supplement his intake. Family is open to all recommendations/education.  Provided list of modular protein supplements to purchase outpatient, as she had questions about this. Reports patient is accepting. Also recommending protein powder outpatient to make any low protein food item, a higher protein item in an attempt to meet his estimated needs. Also gave nutrition education and handouts about high calorie, high protein diet.  Reiterated importance of consistently meeting his nutrition needs sufficiently in an effort to improve his third spacing and prevent further functional decline. Also discussed that his estimated needs may need to be adjusted in the future based off nutrition-focused physical exam and weight  trends. Of note, she is aware that fluid can skew his weight trends. Also pending decision about how to proceed  with pleural intervention going forward.   Admit Weight: 75.3kg Current Weight: 73.2kg  Family endorses 40lbs unintentional weight loss in last year after head and neck chemoradiation as well as immunotherapy. Weight stable x3 months, per his report. This is 27% of his body weight and considered clinically significant for the time frame reviewed. Requires thoracentesis every three weeks at baseline. Euvolemic currently, however note that fluid shifts will impact weight trends.    Intake/Output Summary (Last 24 hours) at 03/24/2024 1414 Last data filed at 03/24/2024 1300 Gross per 24 hour  Intake 158.18 ml  Output 650 ml  Net -491.82 ml    Net IO Since Admission: -10,568.17 mL [03/24/24 1414]   Meds: cholecalciferol , docusate sodium , furosemide , levothyroxine   Labs:  BUN 37>39>42  CRP 3.5>3.9>4.1 TSH 27.131 CBGs 92-100 x24 hours   NUTRITION - FOCUSED PHYSICAL EXAM:  Flowsheet Row Most Recent Value  Orbital Region Severe depletion  Upper Arm Region Severe depletion  Thoracic and Lumbar Region Severe depletion  Buccal Region Severe depletion  Temple Region Severe depletion  Clavicle Bone Region Severe depletion  Clavicle and Acromion Bone Region Severe depletion  Scapular Bone Region Severe depletion  Dorsal Hand Severe depletion  Patellar Region Severe depletion  Anterior Thigh Region Severe depletion  Posterior Calf Region Severe depletion  Edema (RD Assessment) Mild  Hair Reviewed  Eyes Reviewed  Mouth Reviewed  Skin Reviewed  Nails Reviewed    Diet Order:   Diet Order             Diet Heart Room service appropriate? Yes; Fluid consistency: Thin  Diet effective now            EDUCATION NEEDS:   Education needs have been addressed  Skin:  Skin Assessment: Reviewed RN Assessment  Last BM:  6/9  Height:  Ht Readings from Last 1 Encounters:  03/16/24 6' 1 (1.854 m)   Weight:  Wt Readings from Last 1 Encounters:  03/22/24 73.2 kg    BMI:  Body mass  index is 21.29 kg/m.  Estimated Nutritional Needs:   Kcal:  1900-2100 kcals  Protein:  100-110g  Fluid:  >1.9L/day  Con Decant MS, RD, LDN Registered Dietitian Clinical Nutrition RD Inpatient Contact Info in Amion

## 2024-03-24 NOTE — TOC Progression Note (Signed)
 Transition of Care [facility]) - Progression Note    Patient Details  Name: Samuel Porter MRN: [id number] Date of Birth: 1941-03-14  [facility] CM/SW Contact  Ernst Heap Phone Number: [phone] 03/24/2024, 3:22 PM  Clinical Narrative:   HF CSW contacted [facility] to inquire about bed availability. [place] stated at this time they do not have bed availability.   TOC will continue following.       Barriers to Discharge: Continued Medical Work up, Conservator, museum/gallery and Services                                               Social Determinants of Health (SDOH) Interventions SDOH Screenings   Food Insecurity: Patient Declined (03/17/2024)  Housing: Low Risk  (11/30/2022)  Transportation Needs: No Transportation Needs (02/04/2024)   [facility]  Utilities: Low Risk  (02/04/2024)   Received from [facility]  Depression (PHQ2-9): Low Risk  (11/30/2022)  Tobacco Use: Medium Risk (03/23/2024)    Readmission Risk Interventions     No data to display

## 2024-03-24 NOTE — Anesthesia Postprocedure Evaluation (Signed)
 Anesthesia Post Note  Patient: Samuel Porter  Procedure(s) Performed: CARDIOVERSION     Patient location during evaluation: PACU Anesthesia Type: General Level of consciousness: awake and alert and oriented Pain management: pain level controlled Vital Signs Assessment: post-procedure vital signs reviewed and stable Respiratory status: spontaneous breathing, nonlabored ventilation, respiratory function stable and patient connected to nasal cannula oxygen Cardiovascular status: blood pressure returned to baseline and stable Postop Assessment: no apparent nausea or vomiting Anesthetic complications: no   No notable events documented.  Last Vitals:  Vitals:   03/24/24 0745 03/24/24 0749  BP: (!) 91/44 (!) 108/51  Pulse: (!) 50 (!) 51  Resp: 17 12  Temp:    SpO2: 100% 100%    Last Pain:  Vitals:   03/24/24 0733  TempSrc:   PainSc: 0-No pain                 Eyana Stolze A.

## 2024-03-24 NOTE — Progress Notes (Addendum)
 PROGRESS NOTE                                                                                                                                                                                                             Patient Demographics:    Samuel Porter, is a 83 y.o. male, DOB - [birth year], QIO:[id number]  Outpatient Primary MD for the patient is [name], MD    LOS - 8  Admit date - 03/16/2024    Chief Complaint  Patient presents with   Shortness of Breath       Brief Narrative (HPI from H&P)   83 y.o. male with medical history significant for but not limited to BPH, glaucoma, hypertension, vitamin D  deficiency, atrial fibrillation on anticoagulation with recent cardioversion about 2 weeks ago, history of pleural effusions and other comorbidities who presents with worsening shortness of breath last 3 days.  Patient states that he started developing lower extremity edema that progressively got worse as well as worsening shortness of breath that started 3 days ago.  He states that he was at home and is monitoring his O2 saturations and his O2 saturation for 3 days were about 95% on room air and subsequently continued to drop over the 3 days.  Patient went to go see his pulmonologist today and is found to be hypoxic on room air so they directly admitted him to hospital for further evaluation.   In the ER workup consistent with A-fib RVR, acute on chronic diastolic CHF, recurrent pleural effusion right more than left, he also had hypoxic respiratory failure and was admitted to the hospital.     Subjective:   Patient in bed, appears comfortable, denies any headache, no fever, no chest pain or pressure, no shortness of breath , no abdominal pain. No focal weakness.   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  Patient currently on 3 to 4 L nasal cannula oxygen, pulmonary on board underwent right-sided chest tube placement on 03/18/2024 chest tube was discontinued by pulmonary critical care on 03/22/2024 evening, he has lung entrapment syndrome which appears like a pneumothorax but it is not, pleural fluid LDH  is higher than serum suggesting this likely is exudative collection.  Follow pleural fluid cytology initial cytology does not show any malignant cells as of 03/23/2024, he was also seen by cardiothoracic surgery and deemed to be a poor candidate for biopsy procedure, he underwent chest tube placement by critical care on 03/18/2024 and this was removed on 03/22/2024, per critical care if pleural fluid reoccurs he will undergo Pleurx catheter placement.  He is currently being followed by pulmonary critical care and cardiology CHF team.  CHF team managing diuretic dose.  Continue supplemental oxygen, up in chair with I-S and flutter valve for pulmonary toiletry, Home Eliquis  resumed 03/19/2024.   Recurrent effusion right more than left.  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  Currently see above.  Paroxysmal A-fib and RVR, acute on chronic systolic heart failure EF 35%, reduced RV systolic function as well.  Currently on amiodarone  drip for rate control, Eliquis  as above, cardiology consulted, will defer cardiac issues to the cardiology team.  Monitor on telemetry.  Low-dose beta-blocker if tolerated by blood pressure, blood pressure and renal function precludes the use of ACE/ARB or Entresto.  Repeat limited echo done 03/20/2024 noted with low EF of 35% along with  elevated RV filling pressure and reduced RV systolic function seen by CHF team following underwent right heart catheterization on 03/23/2024 which was unremarkable, due for DC cardioversion on 03/24/2024.  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  Elevated troponin: Mildly elevated, trend flat and in non-ACS pattern, this was in setting of A-fib with RVR and AKI. Chest pain-free.  Cardiology on board.   AKI.  Nephrology consulted, diuretic dose being managed by CHF and nephrology team.  Currently close to euvolemic.  Renal function has improved.  Glaucoma: Continue home eyedrops.   BPH: Currently holding his home finasteride    Essential hypertension/currently hypotensive: Continue blood pressure low, hold beta-blocker, midodrine  added, albumin , Prosource, as needed IV hydralazine , monitor.  TSH is extremely high started on Synthroid , stable random cortisol.   Hypoalbuminemia: Patient's Albumin  Level is now 2.6. CTM and Trend and repeat CMP in the AM, placed on Prosource.   Hypothyroidism.  TSH greater than 25 this was the day after admission, free T4 0.71, low-dose Synthroid  and monitor.  Now amiodarone  has been added hands outpatient should be monitored by endocrinologist.       Condition - Extremely Guarded  Family Communication  : Daughter bedside 03/17/2024, daughter over the phone 03/19/2024, son bedside 03/20/2024  Code Status : Full code  Consults  : Pulmonary, cardiology, nephrology, cardiothoracic surgery  PUD Prophylaxis :     Procedures  :     Right heart catheterization on 03/23/2024 by CHF team. 1. Normal left and right heart filling pressures. 2. Borderline elevated PA pressure. 3. Preserved cardiac output.   Right-sided chest tube placement by pulmonary critical care on 03/18/2024, chest tube discontinued 03/22/2024 evening  TTE -    1. Left ventricular ejection fraction, by  estimation, is 30 to 35%. The left ventricle has moderately decreased function. The left ventricle  demonstrates global hypokinesis. The left ventricular internal cavity size was mildly dilated. Left ventricular diastolic parameters are  indeterminate.   2. Abnormal septal motion and septal flattening suggesting significant pulmonary HTN. Right ventricular systolic function is severely reduced. The  right ventricular size is moderately enlarged. Moderately increased right ventricular wall thickness. There is severely elevated pulmonary artery systolic pressure.   3. Left atrial size was moderately dilated.   4. Right atrial size was severely dilated.   5. The mitral valve is abnormal. Mild mitral valve regurgitation. No evidence of mitral stenosis.   6. Tricuspid valve regurgitation is moderate.   7. The aortic valve is tricuspid. Aortic valve regurgitation is not visualized. No aortic stenosis is present.   8. The inferior vena cava is dilated in size with <50% respiratory variability, suggesting right atrial pressure of 15 mmHg.       Disposition Plan  :    Status is: Inpatient   DVT Prophylaxis  :    Place TED hose Start: 03/17/24 1010 SCDs Start: 03/16/24 1742 apixaban  (ELIQUIS ) tablet 5 mg   Lab Results  Component Value Date   PLT 153 03/24/2024    Diet :  Diet Order             Diet NPO time specified  Diet effective midnight                    Inpatient Medications  Scheduled Meds:  (feeding supplement) PROSource Plus  30 mL Oral BID BM   amiodarone   200 mg Oral Daily   apixaban   5 mg Oral BID   cholecalciferol   1,000 Units Oral Daily   dapagliflozin  propanediol  10 mg Oral Daily   digoxin  0.0625 mg Oral Daily   docusate sodium   100 mg Oral BID   dorzolamide -timolol   1 drop Both Eyes BID   levothyroxine   50 mcg Oral Q0600   midodrine   10 mg Oral TID WC   Continuous Infusions:    PRN Meds:.acetaminophen  **OR** acetaminophen , albuterol , bisacodyl ,  hydrALAZINE , ondansetron  **OR** ondansetron  (ZOFRAN ) IV, oxyCODONE , polyethylene glycol  Antibiotics  :    Anti-infectives (From admission, onward)    None         Objective:   Vitals:   03/23/24 2028 03/24/24 0000 03/24/24 0359 03/24/24 0400  BP:  (!) 95/56 106/65 98/70  Pulse:  76 89 84  Resp: 20 13 (!) 21 15  Temp: 98.7 F (37.1 C) 98.8 F (37.1 C) 97.6 F (36.4 C)   TempSrc: Oral Oral Oral   SpO2:  100% 99% 98%  Weight:      Height:        Wt Readings from Last 3 Encounters:  03/22/24 73.2 kg  03/16/24 75.6 kg  03/05/24 73 kg     Intake/Output Summary (Last 24 hours) at 03/24/2024 0708 Last data filed at 03/24/2024 0500 Gross per 24 hour  Intake 158.18 ml  Output 1425 ml  Net -1266.82 ml     Physical Exam  Awake Alert, No new F.N deficits, Normal affect Roann.AT,PERRAL Supple Neck, No JVD,   Symmetrical Chest wall movement, Good air movement bilaterally, few crackles,   RRR,No Gallops,Rubs or new Murmurs,  +ve B.Sounds, Abd Soft, No tenderness,   Trace edema     Data Review:    Recent Labs  Lab 03/20/24 0614 03/21/24 0548 03/22/24 0531 03/23/24 0626 03/23/24 1042 03/23/24 1043 03/24/24 0510  WBC 6.2 7.1 7.2 5.6  --   --  5.9  HGB 12.6* 12.8* 13.3 13.5 15.0 15.0 12.3*  HCT 40.9 42.2 42.8 44.0 44.0 44.0  39.0  PLT 143* 166 155 153  --   --  153  MCV 93.8 95.0 94.1 94.4  --   --  93.5  MCH 28.9 28.8 29.2 29.0  --   --  29.5  MCHC 30.8 30.3 31.1 30.7  --   --  31.5  RDW 14.5 14.6 14.4 14.2  --   --  14.2  LYMPHSABS 0.4* 0.5* 0.5* 0.6*  --   --  0.5*  MONOABS 0.9 0.9 0.9 0.9  --   --  0.9  EOSABS 0.1 0.1 0.1 0.1  --   --  0.1  BASOSABS 0.0 0.0 0.0 0.0  --   --  0.0    Recent Labs  Lab 03/18/24 0332 03/19/24 0427 03/20/24 0614 03/21/24 0548 03/22/24 0531 03/23/24 0626 03/23/24 0857 03/23/24 1042 03/23/24 1043 03/24/24 0510  NA 137 135 137  --  136 137 136 137 138 138  K 3.5 3.4* 4.0  --  4.4 4.5 3.9 3.9 3.9 4.1  CL 101 101 102  --   96* 94* 93*  --   --  94*  CO2 29 28 28   --  31 38* 37*  --   --  35*  ANIONGAP 7 6 7   --  9 5 6   --   --  9  GLUCOSE 101* 112* 101*  --  86 102* 100*  --   --  92  BUN 48* 42* 42*  --  37* 39* 39*  --   --  42*  CREATININE 1.34* 1.34* 1.32*  --  1.06 1.14 1.03  --   --  1.17  CRP 3.9* 3.5* 3.9* 4.1*  --   --   --   --   --   --   PROCALCITON <0.10 <0.10 <0.10 <0.10 <0.10  --   --   --   --   --   BNP 1,029.3* 1,086.3* 1,050.7* 1,089.6*  --   --   --   --   --   --   MG 2.2 2.1 2.1  --   --  2.2  --   --   --  2.2  PHOS 4.5 3.3 2.9 2.7  --   --   --   --   --   --   CALCIUM 8.7* 8.5* 8.7*  --  8.4* 8.8* 8.6*  --   --  8.5*      Recent Labs  Lab 03/18/24 0332 03/19/24 0427 03/20/24 0614 03/21/24 0548 03/22/24 0531 03/23/24 0626 03/23/24 0857 03/24/24 0510  CRP 3.9* 3.5* 3.9* 4.1*  --   --   --   --   PROCALCITON <0.10 <0.10 <0.10 <0.10 <0.10  --   --   --   BNP 1,029.3* 1,086.3* 1,050.7* 1,089.6*  --   --   --   --   MG 2.2 2.1 2.1  --   --  2.2  --  2.2  CALCIUM 8.7* 8.5* 8.7*  --  8.4* 8.8* 8.6* 8.5*     Micro Results No results found for this or any previous visit (from the past 240 hours).  Radiology Report DG Chest Port 1 View Result Date: 03/24/2024 CLINICAL DATA:  83 year old male with shortness of breath. Abnormal chest CT 3 days ago. EXAM: PORTABLE CHEST 1 VIEW COMPARISON:  Chest CT 03/21/2024 and earlier. FINDINGS: Portable AP semi upright view at 0607 hours. A right lung base pleural pigtail catheter has been removed since the  recent CT. Right lung pneumothorax with irregular pleural edge is visible in the apex has not significantly changed since 03/20/2024. And otherwise the lung volumes and bilateral ventilation have not significantly changed from the recent CT. Stable cardiac size and mediastinal contours. Visualized tracheal air column is within normal limits. Stable visualized osseous structures.  Negative visible bowel gas. IMPRESSION: Right pleural pigtail  catheter removal, but otherwise no change in bilateral lung abnormality and ventilation from the CT on 03/21/2024 (please see that report). Unresolved right side pneumothorax has not significantly changed since 03/20/2024. Electronically Signed   By: Marlise Simpers M.D.   On: 03/24/2024 06:57   VAS US  LOWER EXTREMITY VENOUS (DVT) Result Date: 03/23/2024  Lower Venous DVT Study Patient Name:  TORRES HARDENBROOK  Date of Exam:   03/23/2024 Medical Rec #: [id number]        Accession #:    [id number] Date of Birth: 31-Dec-1940         Patient Gender: M Patient Age:   87 years Exam Location:  [facility] Procedure:      VAS US  LOWER EXTREMITY VENOUS (DVT) Referring Phys: Felton Hough --------------------------------------------------------------------------------  Indications: Edema.  Risk Factors: Cancer. Comparison Study: No prior studies. Performing Technologist: Lerry Ransom RVT  Examination Guidelines: A complete evaluation includes B-mode imaging, spectral Doppler, color Doppler, and power Doppler as needed of all accessible portions of each vessel. Bilateral testing is considered an integral part of a complete examination. Limited examinations for reoccurring indications may be performed as noted. The reflux portion of the exam is performed with the patient in reverse Trendelenburg.  +---------+---------------+---------+-----------+----------+--------------+ RIGHT    CompressibilityPhasicitySpontaneityPropertiesThrombus Aging +---------+---------------+---------+-----------+----------+--------------+ CFV      Full           Yes      Yes                                 +---------+---------------+---------+-----------+----------+--------------+ SFJ      Full                                                        +---------+---------------+---------+-----------+----------+--------------+ FV Prox  Full                                                         +---------+---------------+---------+-----------+----------+--------------+ FV Mid   Full                                                        +---------+---------------+---------+-----------+----------+--------------+ FV DistalFull                                                        +---------+---------------+---------+-----------+----------+--------------+ PFV      Full                                                        +---------+---------------+---------+-----------+----------+--------------+  POP      Full           Yes      Yes                                 +---------+---------------+---------+-----------+----------+--------------+ PTV      Full                                                        +---------+---------------+---------+-----------+----------+--------------+ PERO     Full                                                        +---------+---------------+---------+-----------+----------+--------------+   +---------+---------------+---------+-----------+----------+--------------+ LEFT     CompressibilityPhasicitySpontaneityPropertiesThrombus Aging +---------+---------------+---------+-----------+----------+--------------+ CFV      Full           Yes      Yes                                 +---------+---------------+---------+-----------+----------+--------------+ SFJ      Full                                                        +---------+---------------+---------+-----------+----------+--------------+ FV Prox  Full                                                        +---------+---------------+---------+-----------+----------+--------------+ FV Mid   Full                                                        +---------+---------------+---------+-----------+----------+--------------+ FV DistalFull                                                         +---------+---------------+---------+-----------+----------+--------------+ PFV      Full                                                        +---------+---------------+---------+-----------+----------+--------------+ POP      Full           Yes      Yes                                 +---------+---------------+---------+-----------+----------+--------------+  PTV      Full                                                        +---------+---------------+---------+-----------+----------+--------------+ PERO     Full                                                        +---------+---------------+---------+-----------+----------+--------------+     Summary: RIGHT: - There is no evidence of deep vein thrombosis in the lower extremity.  - No cystic structure found in the popliteal fossa.  LEFT: - There is no evidence of deep vein thrombosis in the lower extremity.  - No cystic structure found in the popliteal fossa.  *See table(s) above for measurements and observations. Electronically signed by Irvin Mantel on 03/23/2024 at 4:40:08 PM.    Final    CARDIAC CATHETERIZATION Result Date: 03/23/2024 1. Normal left and right heart filling pressures. 2. Borderline elevated PA pressure. 3. Preserved cardiac output. We can stop IV Lasix , start po Lasix  tomorrow.      Signature  -   Lynnwood Sauer M.D on 03/24/2024 at 7:08 AM   -  To page go to [email or web address]

## 2024-03-24 NOTE — Telephone Encounter (Signed)
 Patient Product/process development scientist completed.    The patient is insured through [employer]. Patient has Medicare and is not eligible for a copay card, but may be able to apply for patient assistance or Medicare RX Payment Plan (Patient Must reach out to their plan, if eligible for payment plan), if available.    Ran test claim for Eliquis 5 mg and the current 30 day co-pay is $152.71.   This test claim was processed through [facility]- copay amounts may vary at other pharmacies due to pharmacy/plan contracts, or as the patient moves through the different stages of their insurance plan.     Roland Earl, CPHT Pharmacy Technician III Certified Patient Advocate [facility] Patient Advocate Team Direct Number: [phone]  Fax: [phone]

## 2024-03-24 NOTE — Transfer of Care (Signed)
 Immediate Anesthesia Transfer of Care Note  Patient: Samuel Porter  Procedure(s) Performed: CARDIOVERSION  Patient Location: PACU  Anesthesia Type:General  Level of Consciousness: drowsy, patient cooperative, and responds to stimulation  Airway & Oxygen Therapy: Patient Spontanous Breathing and Patient connected to nasal cannula oxygen  Post-op Assessment: Report given to RN and Post -op Vital signs reviewed and stable  Post vital signs: Reviewed and stable  Last Vitals:  Vitals Value Taken Time  BP 101/57 03/24/24 0747  Temp    Pulse 51 03/24/24 0748  Resp 12 03/24/24 0748  SpO2 100 % 03/24/24 0748  Vitals shown include unfiled device data.  Last Pain:  Vitals:   03/24/24 0733  TempSrc:   PainSc: 0-No pain         Complications: No notable events documented.

## 2024-03-24 NOTE — Plan of Care (Signed)
  Problem: Health Behavior/Discharge Planning: Goal: Ability to manage health-related needs will improve Outcome: Progressing   Problem: Clinical Measurements: Goal: Ability to maintain clinical measurements within normal limits will improve Outcome: Progressing Goal: Will remain free from infection Outcome: Progressing Goal: Diagnostic test results will improve Outcome: Progressing Goal: Respiratory complications will improve Outcome: Progressing Goal: Cardiovascular complication will be avoided Outcome: Progressing   Problem: Activity: Goal: Risk for activity intolerance will decrease Outcome: Progressing   Problem: Nutrition: Goal: Adequate nutrition will be maintained Outcome: Progressing   Problem: Coping: Goal: Level of anxiety will decrease Outcome: Progressing   Problem: Elimination: Goal: Will not experience complications related to bowel motility Outcome: Progressing Goal: Will not experience complications related to urinary retention Outcome: Progressing   Problem: Pain Managment: Goal: General experience of comfort will improve and/or be controlled Outcome: Progressing   Problem: Safety: Goal: Ability to remain free from injury will improve Outcome: Progressing   Problem: Skin Integrity: Goal: Risk for impaired skin integrity will decrease Outcome: Progressing   Problem: Education: Goal: Ability to demonstrate management of disease process will improve Outcome: Progressing Goal: Ability to verbalize understanding of medication therapies will improve Outcome: Progressing Goal: Individualized Educational Video(s) Outcome: Progressing   Problem: Activity: Goal: Capacity to carry out activities will improve Outcome: Progressing   Problem: Cardiac: Goal: Ability to achieve and maintain adequate cardiopulmonary perfusion will improve Outcome: Progressing   Problem: Education: Goal: Knowledge of disease or condition will improve Outcome:  Progressing Goal: Understanding of medication regimen will improve Outcome: Progressing Goal: Individualized Educational Video(s) Outcome: Progressing   Problem: Activity: Goal: Ability to tolerate increased activity will improve Outcome: Progressing   Problem: Cardiac: Goal: Ability to achieve and maintain adequate cardiopulmonary perfusion will improve Outcome: Progressing   Problem: Health Behavior/Discharge Planning: Goal: Ability to safely manage health-related needs after discharge will improve Outcome: Progressing   Problem: Education: Goal: Understanding of CV disease, CV risk reduction, and recovery process will improve Outcome: Progressing Goal: Individualized Educational Video(s) Outcome: Progressing   Problem: Activity: Goal: Ability to return to baseline activity level will improve Outcome: Progressing   Problem: Cardiovascular: Goal: Ability to achieve and maintain adequate cardiovascular perfusion will improve Outcome: Progressing Goal: Vascular access site(s) Level 0-1 will be maintained Outcome: Progressing   Problem: Health Behavior/Discharge Planning: Goal: Ability to safely manage health-related needs after discharge will improve Outcome: Progressing

## 2024-03-25 ENCOUNTER — Inpatient Hospital Stay (HOSPITAL_COMMUNITY)

## 2024-03-25 DIAGNOSIS — I3139 Other pericardial effusion (noninflammatory): Secondary | ICD-10-CM

## 2024-03-25 DIAGNOSIS — I4891 Unspecified atrial fibrillation: Secondary | ICD-10-CM | POA: Diagnosis not present

## 2024-03-25 DIAGNOSIS — E43 Unspecified severe protein-calorie malnutrition: Secondary | ICD-10-CM

## 2024-03-25 DIAGNOSIS — J90 Pleural effusion, not elsewhere classified: Secondary | ICD-10-CM | POA: Diagnosis not present

## 2024-03-25 DIAGNOSIS — J9601 Acute respiratory failure with hypoxia: Secondary | ICD-10-CM | POA: Diagnosis not present

## 2024-03-25 LAB — CBC WITH DIFFERENTIAL/PLATELET
Abs Immature Granulocytes: 0.03 10*3/uL (ref 0.00–0.07)
Basophils Absolute: 0 10*3/uL (ref 0.0–0.1)
Basophils Relative: 0 %
Eosinophils Absolute: 0.1 10*3/uL (ref 0.0–0.5)
Eosinophils Relative: 1 %
HCT: 38.6 % — ABNORMAL LOW (ref 39.0–52.0)
Hemoglobin: 11.9 g/dL — ABNORMAL LOW (ref 13.0–17.0)
Immature Granulocytes: 1 %
Lymphocytes Relative: 6 %
Lymphs Abs: 0.4 10*3/uL — ABNORMAL LOW (ref 0.7–4.0)
MCH: 29.2 pg (ref 26.0–34.0)
MCHC: 30.8 g/dL (ref 30.0–36.0)
MCV: 94.6 fL (ref 80.0–100.0)
Monocytes Absolute: 1 10*3/uL (ref 0.1–1.0)
Monocytes Relative: 17 %
Neutro Abs: 4.5 10*3/uL (ref 1.7–7.7)
Neutrophils Relative %: 75 %
Platelets: 146 10*3/uL — ABNORMAL LOW (ref 150–400)
RBC: 4.08 MIL/uL — ABNORMAL LOW (ref 4.22–5.81)
RDW: 14.2 % (ref 11.5–15.5)
WBC: 6 10*3/uL (ref 4.0–10.5)
nRBC: 0 % (ref 0.0–0.2)

## 2024-03-25 LAB — BASIC METABOLIC PANEL WITH GFR
Anion gap: 5 (ref 5–15)
BUN: 40 mg/dL — ABNORMAL HIGH (ref 8–23)
CO2: 37 mmol/L — ABNORMAL HIGH (ref 22–32)
Calcium: 8.9 mg/dL (ref 8.9–10.3)
Chloride: 95 mmol/L — ABNORMAL LOW (ref 98–111)
Creatinine, Ser: 1.17 mg/dL (ref 0.61–1.24)
GFR, Estimated: >60 mL/min (ref >60)
Glucose, Bld: 138 mg/dL — ABNORMAL HIGH (ref 70–99)
Potassium: 4.1 mmol/L (ref 3.5–5.1)
Sodium: 137 mmol/L (ref 135–145)

## 2024-03-25 LAB — DIGOXIN LEVEL: Digoxin Level: 0.5 ng/mL — ABNORMAL LOW (ref 0.8–2.0)

## 2024-03-25 LAB — MAGNESIUM: Magnesium: 2.2 mg/dL (ref 1.7–2.4)

## 2024-03-25 MED ORDER — GADOBUTROL 1 MMOL/ML IV SOLN
10.0000 mL | Freq: Once | INTRAVENOUS | Status: AC | PRN
  Administered 2024-03-25: 10 mL via INTRAVENOUS

## 2024-03-25 MED ORDER — LORAZEPAM 2 MG/ML IJ SOLN
0.5000 mg | Freq: Once | INTRAMUSCULAR | Status: AC | PRN
  Administered 2024-03-25: 0.5 mg via INTRAVENOUS
  Filled 2024-03-25: qty 1

## 2024-03-25 NOTE — Progress Notes (Signed)
 PT Cancellation Note  [name] MRN: [id number] DOB: 03/31/1941   Cancelled Treatment:    Reason Eval/Treat Not Completed: (P) Patient at procedure or test/unavailable (pt off the floor for Cardiac Morphology procedure.) Will continue efforts per PT plan of care as schedule permits.   Nekesha Font M Kasiyah Platter 03/25/2024, 3:07 PM

## 2024-03-25 NOTE — Progress Notes (Addendum)
 Advanced Heart Failure Rounding Note  Cardiologist: Arnoldo Lapping, MD  Chief Complaint: PHTN Subjective:    6/9 RHC: RA 4, PAP 34/18 (25), PCWP 13, CO/CI (fick) 4.59/2.34, PVR 2.6w 6/10: DCCV>NSR  Remains in sinus with rate in 50s and dipping into 40s overnight.  Weight stable. Cr stable. CO2 and BUN elevated. BP stable.  No SOB, dizziness, CP. Sitting up in the chair this morning. No orthostasis with transfer.    Objective:    Weight Range: 72.3 kg Body mass index is 21.03 kg/m.   Vital Signs:   Temp:  [96.4 F (35.8 C)-98.3 F (36.8 C)] 98.3 F (36.8 C) (06/11 0346) Pulse Rate:  [50-56] 50 (06/11 0400) Resp:  [14-21] 15 (06/11 0400) BP: (98-123)/(46-63) 98/50 (06/11 0400) SpO2:  [96 %-100 %] 96 % (06/11 0400) Weight:  [72.3 kg] 72.3 kg (06/11 0500) Last BM Date : 03/19/24  Weight change: Filed Weights   03/22/24 0600 03/22/24 1713 03/25/24 0500  Weight: 75.5 kg 73.2 kg 72.3 kg   Intake/Output:  Intake/Output Summary (Last 24 hours) at 03/25/2024 0851 Last data filed at 03/25/2024 0829 Gross per 24 hour  Intake 220 ml  Output 975 ml  Net -755 ml    Physical Exam    General: Cachectic, frail. No distress on [facility]: JVP flat. S1 and S2 present. No murmurs or rub. Resp: RLL and RML absent. LLL with fine crackles. Clear LUL.  Extremities: Warm and dry.  3+ pitting ankle edema.  Neuro: Alert and oriented x3. Affect pleasant.   Telemetry   SB in 50s, down to 40s overnight (personally reviewed)  EKG    Sinus brady 53 bpm (personally reviewed)  Labs    CBC Recent Labs    03/24/24 0510 03/25/24 0552  WBC 5.9 6.0  NEUTROABS 4.4 4.5  HGB 12.3* 11.9*  HCT 39.0 38.6*  MCV 93.5 94.6  PLT 153 146*   Basic Metabolic Panel Recent Labs    16/10/96 0510 03/25/24 0552  NA 138 137  K 4.1 4.1  CL 94* 95*  CO2 35* 37*  GLUCOSE 92 138*  BUN 42* 40*  CREATININE 1.17 1.17  CALCIUM 8.5* 8.9  MG 2.2 2.2   BNP (last 3 results) Recent Labs     03/19/24 0427 03/20/24 0614 03/21/24 0548  BNP 1,086.3* 1,050.7* 1,089.6*   ProBNP (last 3 results) Recent Labs    03/16/24 1238  PROBNP 7,160.0*   Imaging   DG Swallowing Func-Speech Pathology Result Date: 03/24/2024 Table formatting from the original result was not included. Modified Barium Swallow Study [name] MRN: [id number] Date of Birth: 1940/11/28 Today's Date: 03/24/2024 HPI/PMH: HPI: Samuel Porter is an 83 yo male presenting to ED 6/2 with worsening shortness of breath x3 days. Found to have A-fib RVR, acute on chronic diastolic HF, recurrent R>L pleural effusion, and hypoxic respiratory failure. CT Chest shows large, multiloculated R hydropneumothorax with chest tube in place as well as unchanged underlying pulmonary fibrosis but assessment limited by pleural effusions and atelectasis. MBS ordered per MD. PMH includes BPH, glaucoma, HTN, A-fib on anticoagulation with recent cardioversion x2 weeks PTA, history of SCC, unintentional weight loss Clinical Impression: Clinical Impression: Pt presents with a functional oropharyngeal swallow. No penetration/aspiration occurred throughout the study and there was no significant residue. The 13 mm barium tablet was given with thin liquids and was [facility], without noted esophageal retention or retrograde backflow. Education completed with pt and his daughter. Recommend he continue  his current diet without ongoing SLP f/u. Will sign off at this time. Factors that may increase risk of adverse event in presence of aspiration Roderick Civatte & Jessy Morocco 2021): Factors that may increase risk of adverse event in presence of aspiration Roderick Civatte & Jessy Morocco 2021): Poor general health and/or compromised immunity; Respiratory or GI disease; Frail or deconditioned Recommendations/Plan: Swallowing Evaluation Recommendations Swallowing Evaluation Recommendations Recommendations: PO diet PO Diet Recommendation: Regular; Thin liquids (Level 0) Liquid  Administration via: Cup; Straw Medication Administration: Whole meds with liquid Supervision: Patient able to self-feed Swallowing strategies  : Minimize environmental distractions; Slow rate; Small bites/sips Postural changes: Position pt fully upright for meals Oral care recommendations: Oral care BID (2x/day) Treatment Plan Treatment Plan Treatment recommendations: No treatment recommended at this time Follow-up recommendations: No SLP follow up Functional status assessment: Patient has not had a recent decline in their functional status. Recommendations Recommendations for follow up therapy are one component of a multi-disciplinary discharge planning process, led by the attending physician.  Recommendations may be updated based on patient status, additional functional criteria and insurance authorization. Assessment: Orofacial Exam: Orofacial Exam Oral Cavity: Oral Hygiene: WFL Oral Cavity - Dentition: Adequate natural dentition Orofacial Anatomy: WFL Oral Motor/Sensory Function: WFL Anatomy: Anatomy: Suspected cervical osteophytes Boluses Administered: Boluses Administered Boluses Administered: Thin liquids (Level 0); Mildly thick liquids (Level 2, nectar thick); Moderately thick liquids (Level 3, honey thick); Puree; Solid  Oral Impairment Domain: Oral Impairment Domain Lip Closure: No labial escape Tongue control during bolus hold: Cohesive bolus between tongue to palatal seal Bolus preparation/mastication: Timely and efficient chewing and mashing Bolus transport/lingual motion: Brisk tongue motion Oral residue: Complete oral clearance Location of oral residue : N/A Initiation of pharyngeal swallow : Posterior angle of the ramus  Pharyngeal Impairment Domain: Pharyngeal Impairment Domain Soft palate elevation: No bolus between soft palate (SP)/pharyngeal wall (PW) Laryngeal elevation: Complete superior movement of thyroid  cartilage with complete approximation of arytenoids to epiglottic petiole Anterior hyoid  excursion: Complete anterior movement Epiglottic movement: Complete inversion Laryngeal vestibule closure: Complete, no air/contrast in laryngeal vestibule Pharyngeal stripping wave : Present - complete Pharyngeal contraction (A/P view only): N/A Pharyngoesophageal segment opening: Complete distension and complete duration, no obstruction of flow Tongue base retraction: No contrast between tongue base and posterior pharyngeal wall (PPW) Pharyngeal residue: Complete pharyngeal clearance Location of pharyngeal residue: N/A  Esophageal Impairment Domain: Esophageal Impairment Domain Esophageal clearance upright position: Complete clearance, esophageal coating Pill: Pill Consistency administered: Thin liquids (Level 0) Thin liquids (Level 0): [facility] Penetration/Aspiration Scale Score: Penetration/Aspiration Scale Score 1.  Material does not enter airway: Thin liquids (Level 0); Mildly thick liquids (Level 2, nectar thick); Puree; Pill; Solid; Moderately thick liquids (Level 3, honey thick) Compensatory Strategies: Compensatory Strategies Compensatory strategies: No   General Information: Caregiver present: Yes (daughter)  Diet Prior to this Study: Regular; Thin liquids (Level 0)   Temperature : Normal   Respiratory Status: WFL   Supplemental O2: Nasal cannula   History of Recent Intubation: No  Behavior/Cognition: Alert; Cooperative; Pleasant mood Self-Feeding Abilities: Able to self-feed Baseline vocal quality/speech: Normal Volitional Cough: Able to elicit Volitional Swallow: Able to elicit Exam Limitations: No limitations Goal Planning: Prognosis for improved oropharyngeal function: Good No data recorded No data recorded Patient/Family Stated Goal: want to get to root of illness Consulted and agree with results and recommendations: Patient; Family member/caregiver; Physician Pain: Pain Assessment Pain Assessment: Faces Faces Pain Scale: 0 Pain Intervention(s): Monitored during session End of Session: Start Time:SLP  Start Time (  ACUTE ONLY): 1002 Stop Time: SLP Stop Time (ACUTE ONLY): 1014 Time Calculation:SLP Time Calculation (min) (ACUTE ONLY): 12 min Charges: SLP Evaluations $ SLP Speech Visit: 1 Visit SLP Evaluations $MBS Swallow: 1 Procedure SLP visit diagnosis: SLP Visit Diagnosis: Dysphagia, unspecified (R13.10) Past Medical History: Past Medical History: Diagnosis Date  Arthritis   oa  BPH (benign prostatic hyperplasia)   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  Cataracts, bilateral   Glaucoma   Hypertension   Vitamin D  deficiency  Past Surgical History: Past Surgical History: Procedure Laterality Date  CARDIOVERSION N/A 03/05/2024  Procedure: CARDIOVERSION;  Surgeon: Hazle Lites, MD;  Location: [facility] INVASIVE CV LAB;  Service: Cardiovascular;  Laterality: N/A;  CHEST TUBE INSERTION Right 03/18/2024  Procedure: CHEST TUBE INSERTION;  Surgeon: Guerry Leek, MD;  Location: [facility] ENDOSCOPY;  Service: Pulmonary;  Laterality: Right;  CHOLECYSTECTOMY    colonscopy  2017  IR THORACENTESIS ASP PLEURAL SPACE W/IMG GUIDE  03/03/2024  JOINT REPLACEMENT Right   hip   RIGHT HEART CATH N/A 03/23/2024  Procedure: RIGHT HEART CATH;  Surgeon: Darlis Eisenmenger, MD;  Location: [facility] INVASIVE CV LAB;  Service: Cardiovascular;  Laterality: N/A;  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  Procedure: LEFT TOTAL HIP ARTHROPLASTY ANTERIOR APPROACH;  Surgeon: Liliane Rei, MD;  Location: [facility] ORS;  Service: Orthopedics;  Laterality: Left;  VENTRAL HERNIA REPAIR N/A 07/12/2023  Procedure: HERNIA REPAIR VENTRAL;  Surgeon: Junie Olds, MD;  Location: [facility] OR;  Service: General;  Laterality: N/A; Amil Kale, M.A., CCC-SLP Speech Language Pathology, Acute Rehabilitation Services Secure Chat preferred [phone] 03/24/2024, 12:27 PM  Medications:    Scheduled Medications:  (feeding supplement) PROSource Plus  30 mL Oral BID AC   amiodarone   200 mg Oral Daily   apixaban   5 mg Oral BID   cholecalciferol   1,000 Units Oral Daily    dapagliflozin  propanediol  10 mg Oral Daily   digoxin  0.0625 mg Oral Daily   docusate sodium   100 mg Oral BID   dorzolamide -timolol   1 drop Both Eyes BID   feeding supplement  237 mL Oral TID BM   furosemide   20 mg Oral Daily   levothyroxine   50 mcg Oral Q0600   midodrine   5 mg Oral TID WC   multivitamin with minerals  1 tablet Oral Daily   Infusions:  PRN Medications: acetaminophen  **OR** acetaminophen , albuterol , bisacodyl , hydrALAZINE , ondansetron  **OR** ondansetron  (ZOFRAN ) IV, oxyCODONE , polyethylene glycol  Patient Profile   Samuel Porter is a 83 y.o. male with a PMH of squamous cell carcinoma treated with PD-1, HFrEF, afib, HTN. HF following for pHTN.   Assessment/Plan   1. 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  Echo this admission with EF 30-35%, moderate RV enlargement/severe RV dysfunction with D-shaped septum, PASP 70 mmHg.  Etiology unclear. PD-1 blocker (cemiplimab ) can be associated with pulmonary hypertension, but rare. 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 Decreased LV systolic function may be due in part to septal shift/RV dysfunction and in part due to atrial fibrillation.  Troponin minimally elevated with no trend.  RHC on 6/9 showed normal filling pressures, preserved cardiac output, and only borderline elevated PA pressure. IV Lasix  was stopped. GDMT limited by midodrine  use, weaning. Creatinine 1.17 today.  - Stop midodrine . MAP remains in 70s. - Continue Lasix  20 mg daily.  - Continue Farxiga  10 mg daily.  - Continue digoxin 0.0625 for RV  support. Level 0.5. CTM HR.  - Holding ? blocker with RV dysfunction and bradycardia.  2.  Right pleural effusion: Chronic/recurrent.  Multiple thoracenteses, fluid cytology has been negative.  Now with trapped lung. Do suspect some cardiac involvement, but his degree of LV dysfunction seems out of proportion and with predominatly RV  failure and no significant MR his persistent effusion is not easily explained.  Right chest tube removed. RHC 6/9 showed normal filling pressures, do not think that CHF is currently driving his effusion. CXR with hydropneumothorax on right.  - Breath sounds remain absent in RLL and RML on auscultation 3. 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  Pleural fluid cytology has been negative. S/p immunotherapy (cemiplimab ).  4. Atrial fibrillation: Persistent.  DCCV in 5/25 but recurred. Patient had successful DCCV to NSR yesterday. Now with sinus brady in 50s on tele, asymptomatic.  - Continue amiodarone  200 mg daily. Ultimately not a great long-term medication for him with his pulmonary problems. CTM HR.  - Continue eliquis  5 mg bid. 5. Pulmonary: Right recurrent pleural effusion as above with trapped lung.  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   Length of Stay: 76  [place] Lee, NP  03/25/2024, 8:51 AM  Advanced Heart Failure Team Pager [phone] (M-F; 7a - 5p)  Please contact [facility] for night-coverage after hours (5p -7a ) and weekends on [email or web address]  Patient seen with NP, I formulated the plan and agree with the above note.   Patient remains in NSR, BP stable off midodrine .   General: NAD, thin Neck: JVP 7-8 cm, no thyromegaly or thyroid  nodule.  Lungs: Decreased BS on right.  CV: Nondisplaced PMI.  Heart regular S1/S2, no S3/S4, no murmur.  1+ ankle edema.     Abdomen: Soft, nontender, no hepatosplenomegaly, no distention.  Skin: Intact without lesions or rashes.  Neurologic: Alert and oriented x 3.  Psych: Normal affect. Extremities: No clubbing or cyanosis.  HEENT: Normal.   Volume status looks ok today, continue Lasix  20 mg daily + Farxiga  10 mg daily.  Can continue low dose digoxin for now, would stop if he has clinically significant bradycardia or if we think he has cardiac amyloidosis.   Continue current amiodarone  and apixaban , he is  in NSR.   I do have some concern that he could have cardiac amyloidosis though morphology of the heart is not highly suggestive.  He has orthostatic hypotension and atrial fibrillation.   - I will arrange for cardiac MRI.  We are not able to do a PYP scan at this time.  - Will get urine and serum immunofixation.   [name] 03/25/2024 11:52 AM

## 2024-03-25 NOTE — Progress Notes (Signed)
 03/25/2024  Seen in f/u for SOB  S: No events. On a calorie count.  O:    03/25/2024    5:00 AM 03/25/2024    4:00 AM 03/25/2024    3:46 AM  Vitals with BMI  Weight 159 lbs 6 oz    BMI 21.03    Systolic  98 102  Diastolic  50 52  Pulse  50   Cachectic man in no distress Temporal wasting Garbled speech Reduced breath sounds on right stable Remains sinus brady  MBSS and RD note reviewed  A: Acute on chronic hypoxemic respiratory failure, FTT, weight loss, entrapped R lung, severe protein calorie manutrition POA (see discussion 03/24/24)  P: Await calorie counts to determine if he would benefit from PEG Needs to keep track of weight at home and adjust diuretics accordingly AM CXR, no SOB at present; usually has symptoms q2 weeks; discussed options and I think best option here is PRN thoracenteses, may not need as frequently as his nutrition and volume status is optimized   Ardelle Kos MD Pulmonary Critical Care Medicine Securechat if during day (7a-7p) [phone] if after hours (7p-7a)

## 2024-03-25 NOTE — Progress Notes (Signed)
 PROGRESS NOTE                                                                                                                                                                                                             Patient Demographics:    Samuel Porter, is a 83 y.o. male, DOB - Sep 14, 1941, OVF:[id number]  Outpatient Primary MD for the patient is [name], MD    LOS - 9  Admit date - 03/16/2024    Chief Complaint  Patient presents with   Shortness of Breath       Brief Narrative (HPI from H&P)    83 y.o. male with medical history significant for but not limited to BPH, glaucoma, hypertension, vitamin D  deficiency, atrial fibrillation on anticoagulation with recent cardioversion about 2 weeks ago, history of pleural effusions and other comorbidities who presents with worsening shortness of breath last 3 days.  Patient states that he started developing lower extremity edema that progressively got worse as well as worsening shortness of breath that started 3 days ago.  He states that he was at home and is monitoring his O2 saturations and his O2 saturation for 3 days were about 95% on room air and subsequently continued to drop over the 3 days.  Patient went to go see his pulmonologist today and is found to be hypoxic on room air so they directly admitted him to hospital for further evaluation.   In the ER workup consistent with A-fib RVR, acute on chronic diastolic CHF, recurrent pleural effusion right more than left, he also had hypoxic respiratory failure and was admitted to the hospital.     Subjective:   Patient sitting in recliner, reports good night sleep, comfortable, he had a good breakfast almost finished all his breakfast and half of Ensure this morning.      Assessment  & Plan :    Acute respiratory failure with hypoxia  Recurrent right pleural effusion . - Required chest tube initially, currently  discontinued . - So far cytology has been negative -Pleural effusion felt to be secondary to volume overload the setting of uncontrolled A-fib, and acute on chronic CHF with malnutrition/hypoalbuminemia. - Pleural effusion significant for exudate, but no bacteria or malignant cells. - Management per PCCM, will await further recommendations, likely will need Pleurx catheter if recurrent  Paroxysmal A-fib and  RVR - Post cardioversion 6/10. 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  Elevated troponin: Mildly elevated, trend flat and in non-ACS pattern, this was in setting of A-fib with RVR and AKI. Chest pain-free.  Cardiology on board.   AKI.  Nephrology consulted, diuretic dose being managed by CHF and nephrology team.  Currently close to euvolemic.  Renal function has improved.  Glaucoma: Continue home eyedrops.   BPH: Currently holding his home finasteride    Essential hypertension/currently hypotensive: Continue blood pressure low, hold beta-blocker, midodrine  added, albumin , Prosource, as needed IV hydralazine , monitor.  TSH is extremely high started on Synthroid , stable random cortisol.   Hypoalbuminemia: Patient's Albumin  Level is now 2.6. CTM and Trend and repeat CMP in the AM, placed on Prosource.   Hypothyroidism.  TSH greater than 25 this was the day after admission, free T4 0.71,  low-dose Synthroid  and monitor.  Now amiodarone  has been added hands outpatient should be monitored by endocrinologist.       Condition - Extremely Guarded  Family Communication  : None at bedside  Code Status : Full code  Consults  : Pulmonary, cardiology, nephrology, cardiothoracic surgery  PUD Prophylaxis :     Procedures  :     Right heart catheterization on 03/23/2024 by CHF team. 1. Normal left and right heart filling pressures. 2. Borderline elevated PA pressure. 3. Preserved cardiac output.   Right-sided chest tube placement by pulmonary critical care on 03/18/2024, chest tube discontinued 03/22/2024 evening  TTE -    1. Left ventricular ejection fraction, by estimation, is 30 to 35%. The left ventricle has moderately decreased function. The left ventricle  demonstrates global hypokinesis. The left ventricular internal cavity size was mildly dilated. Left ventricular diastolic parameters are indeterminate.   2. Abnormal septal motion and septal flattening suggesting significant pulmonary HTN. Right ventricular systolic function is severely reduced. The  right ventricular size is moderately enlarged. Moderately increased right ventricular wall thickness. There is severely elevated pulmonary artery systolic pressure.   3. Left atrial size was moderately dilated.   4. Right atrial size was severely dilated.   5. The mitral valve is abnormal. Mild mitral valve regurgitation. No evidence of mitral stenosis.   6. Tricuspid valve regurgitation is moderate.   7. The aortic valve is tricuspid. Aortic valve regurgitation is not visualized. No aortic stenosis is present.   8. The inferior vena cava is dilated in size with <50% respiratory variability, suggesting right atrial pressure of 15 mmHg.       Disposition Plan  :    Status is: Inpatient   DVT Prophylaxis  :    Place TED hose Start: 03/17/24 1010 SCDs Start: 03/16/24 1742 apixaban  (ELIQUIS ) tablet 5 mg   Lab Results   Component Value Date   PLT 146 (L) 03/25/2024    Diet :  Diet Order             Diet Heart  Room service appropriate? Yes; Fluid consistency: Thin  Diet effective now                    Inpatient Medications  Scheduled Meds:  (feeding supplement) PROSource Plus  30 mL Oral BID AC   amiodarone   200 mg Oral Daily   apixaban   5 mg Oral BID   cholecalciferol   1,000 Units Oral Daily   dapagliflozin  propanediol  10 mg Oral Daily   digoxin  0.0625 mg Oral Daily   docusate sodium   100 mg Oral BID   dorzolamide -timolol   1 drop Both Eyes BID   feeding supplement  237 mL Oral TID BM   furosemide   20 mg Oral Daily   levothyroxine   50 mcg Oral Q0600   multivitamin with minerals  1 tablet Oral Daily   Continuous Infusions:    PRN Meds:.acetaminophen  **OR** acetaminophen , albuterol , bisacodyl , hydrALAZINE , LORazepam, ondansetron  **OR** ondansetron  (ZOFRAN ) IV, oxyCODONE , polyethylene glycol  Antibiotics  :    Anti-infectives (From admission, onward)    None         Objective:   Vitals:   03/25/24 0400 03/25/24 0500 03/25/24 0903 03/25/24 1200  BP: (!) 98/50  (!) 125/56 (!) 112/52  Pulse: (!) 50  (!) 57 (!) 54  Resp: 15  17 15   Temp:   97.7 F (36.5 C)   TempSrc:   Oral   SpO2: 96%  92% 92%  Weight:  72.3 kg    Height:        Wt Readings from Last 3 Encounters:  03/25/24 72.3 kg  03/16/24 75.6 kg  03/05/24 73 kg     Intake/Output Summary (Last 24 hours) at 03/25/2024 1357 Last data filed at 03/25/2024 0829 Gross per 24 hour  Intake 220 ml  Output 725 ml  Net -505 ml     Physical Exam   Awake Alert, Oriented X 3, frail, deconditioned Symmetrical Chest wall movement, Good air movement bilaterally, no wheezing RRR,No Gallops,Rubs or new Murmurs, No Parasternal Heave +ve B.Sounds, Abd Soft, No tenderness, No rebound - guarding or rigidity. No Cyanosis, +1 edema, No new Rash or bruise     Data Review:    Recent Labs  Lab 03/21/24 0548  03/22/24 0531 03/23/24 0626 03/23/24 1042 03/23/24 1043 03/24/24 0510 03/25/24 0552  WBC 7.1 7.2 5.6  --   --  5.9 6.0  HGB 12.8* 13.3 13.5 15.0 15.0 12.3* 11.9*  HCT 42.2 42.8 44.0 44.0 44.0 39.0 38.6*  PLT 166 155 153  --   --  153 146*  MCV 95.0 94.1 94.4  --   --  93.5 94.6  MCH 28.8 29.2 29.0  --   --  29.5 29.2  MCHC 30.3 31.1 30.7  --   --  31.5 30.8  RDW 14.6 14.4 14.2  --   --  14.2 14.2  LYMPHSABS 0.5* 0.5* 0.6*  --   --  0.5* 0.4*  MONOABS 0.9 0.9 0.9  --   --  0.9 1.0  EOSABS 0.1 0.1 0.1  --   --  0.1 0.1  BASOSABS 0.0 0.0 0.0  --   --  0.0 0.0    Recent Labs  Lab 03/19/24 0427 03/20/24 0614 03/21/24 0548 03/22/24 0531 03/23/24 0626 03/23/24 0857 03/23/24 1042 03/23/24 1043 03/24/24 0510 03/25/24 0552  NA 135 137  --  136 137 136 137 138 138 137  K 3.4* 4.0  --  4.4 4.5 3.9 3.9 3.9 4.1 4.1  CL 101 102  --  96* 94* 93*  --   --  94* 95*  CO2 28 28  --  31 38* 37*  --   --  35* 37*  ANIONGAP 6 7  --  9 5 6   --   --  9 5  GLUCOSE 112* 101*  --  86 102* 100*  --   --  92 138*  BUN 42* 42*  --  37* 39* 39*  --   --  42* 40*  CREATININE 1.34* 1.32*  --  1.06 1.14 1.03  --   --  1.17 1.17  CRP 3.5* 3.9* 4.1*  --   --   --   --   --   --   --   PROCALCITON <0.10 <0.10 <0.10 <0.10  --   --   --   --   --   --   BNP 1,086.3* 1,050.7* 1,089.6*  --   --   --   --   --   --   --   MG 2.1 2.1  --   --  2.2  --   --   --  2.2 2.2  PHOS 3.3 2.9 2.7  --   --   --   --   --   --   --   CALCIUM 8.5* 8.7*  --  8.4* 8.8* 8.6*  --   --  8.5* 8.9      Recent Labs  Lab 03/19/24 0427 03/20/24 0614 03/21/24 0548 03/22/24 0531 03/23/24 0626 03/23/24 0857 03/24/24 0510 03/25/24 0552  CRP 3.5* 3.9* 4.1*  --   --   --   --   --   PROCALCITON <0.10 <0.10 <0.10 <0.10  --   --   --   --   BNP 1,086.3* 1,050.7* 1,089.6*  --   --   --   --   --   MG 2.1 2.1  --   --  2.2  --  2.2 2.2  CALCIUM 8.5* 8.7*  --  8.4* 8.8* 8.6* 8.5* 8.9     Micro Results No results found for  this or any previous visit (from the past 240 hours).  Radiology Report EP STUDY Result Date: 03/24/2024 See surgical note for result.  DG Swallowing Func-Speech Pathology Result Date: 03/24/2024 Table formatting from the original result was not included. Modified Barium Swallow Study [name] MRN: [id number] Date of Birth: 02/03/1941 Today's Date: 03/24/2024 HPI/PMH: HPI: FAVIO MODER is an 83 yo male presenting to ED 6/2 with worsening shortness of breath x3 days. Found to have A-fib RVR, acute on chronic diastolic HF, recurrent R>L pleural effusion, and hypoxic respiratory failure. CT Chest shows large, multiloculated R hydropneumothorax with chest tube in place as well as unchanged underlying pulmonary fibrosis but assessment limited by pleural effusions and atelectasis. MBS ordered per MD. PMH includes BPH, glaucoma, HTN, A-fib on anticoagulation with recent cardioversion x2 weeks PTA, history of SCC, unintentional weight loss Clinical Impression: Clinical Impression: Pt presents with a functional oropharyngeal swallow. No penetration/aspiration occurred throughout the study and there was no significant residue. The 13 mm barium tablet was given with thin liquids and was [facility], without noted esophageal retention or retrograde backflow. Education completed with pt and his daughter. Recommend he continue his current diet without ongoing SLP f/u. Will sign off at this time. Factors that may increase risk of adverse event in presence of aspiration Roderick Civatte &  Jessy Morocco 2021): Factors that may increase risk of adverse event in presence of aspiration Roderick Civatte & Jessy Morocco 2021): Poor general health and/or compromised immunity; Respiratory or GI disease; Frail or deconditioned Recommendations/Plan: Swallowing Evaluation Recommendations Swallowing Evaluation Recommendations Recommendations: PO diet PO Diet Recommendation: Regular; Thin liquids (Level 0) Liquid Administration via: Cup; Straw  Medication Administration: Whole meds with liquid Supervision: Patient able to self-feed Swallowing strategies  : Minimize environmental distractions; Slow rate; Small bites/sips Postural changes: Position pt fully upright for meals Oral care recommendations: Oral care BID (2x/day) Treatment Plan Treatment Plan Treatment recommendations: No treatment recommended at this time Follow-up recommendations: No SLP follow up Functional status assessment: Patient has not had a recent decline in their functional status. Recommendations Recommendations for follow up therapy are one component of a multi-disciplinary discharge planning process, led by the attending physician.  Recommendations may be updated based on patient status, additional functional criteria and insurance authorization. Assessment: Orofacial Exam: Orofacial Exam Oral Cavity: Oral Hygiene: WFL Oral Cavity - Dentition: Adequate natural dentition Orofacial Anatomy: WFL Oral Motor/Sensory Function: WFL Anatomy: Anatomy: Suspected cervical osteophytes Boluses Administered: Boluses Administered Boluses Administered: Thin liquids (Level 0); Mildly thick liquids (Level 2, nectar thick); Moderately thick liquids (Level 3, honey thick); Puree; Solid  Oral Impairment Domain: Oral Impairment Domain Lip Closure: No labial escape Tongue control during bolus hold: Cohesive bolus between tongue to palatal seal Bolus preparation/mastication: Timely and efficient chewing and mashing Bolus transport/lingual motion: Brisk tongue motion Oral residue: Complete oral clearance Location of oral residue : N/A Initiation of pharyngeal swallow : Posterior angle of the ramus  Pharyngeal Impairment Domain: Pharyngeal Impairment Domain Soft palate elevation: No bolus between soft palate (SP)/pharyngeal wall (PW) Laryngeal elevation: Complete superior movement of thyroid  cartilage with complete approximation of arytenoids to epiglottic petiole Anterior hyoid excursion: Complete anterior  movement Epiglottic movement: Complete inversion Laryngeal vestibule closure: Complete, no air/contrast in laryngeal vestibule Pharyngeal stripping wave : Present - complete Pharyngeal contraction (A/P view only): N/A Pharyngoesophageal segment opening: Complete distension and complete duration, no obstruction of flow Tongue base retraction: No contrast between tongue base and posterior pharyngeal wall (PPW) Pharyngeal residue: Complete pharyngeal clearance Location of pharyngeal residue: N/A  Esophageal Impairment Domain: Esophageal Impairment Domain Esophageal clearance upright position: Complete clearance, esophageal coating Pill: Pill Consistency administered: Thin liquids (Level 0) Thin liquids (Level 0): [facility] Penetration/Aspiration Scale Score: Penetration/Aspiration Scale Score 1.  Material does not enter airway: Thin liquids (Level 0); Mildly thick liquids (Level 2, nectar thick); Puree; Pill; Solid; Moderately thick liquids (Level 3, honey thick) Compensatory Strategies: Compensatory Strategies Compensatory strategies: No   General Information: Caregiver present: Yes (daughter)  Diet Prior to this Study: Regular; Thin liquids (Level 0)   Temperature : Normal   Respiratory Status: WFL   Supplemental O2: Nasal cannula   History of Recent Intubation: No  Behavior/Cognition: Alert; Cooperative; Pleasant mood Self-Feeding Abilities: Able to self-feed Baseline vocal quality/speech: Normal Volitional Cough: Able to elicit Volitional Swallow: Able to elicit Exam Limitations: No limitations Goal Planning: Prognosis for improved oropharyngeal function: Good No data recorded No data recorded Patient/Family Stated Goal: want to get to root of illness Consulted and agree with results and recommendations: Patient; Family member/caregiver; Physician Pain: Pain Assessment Pain Assessment: Faces Faces Pain Scale: 0 Pain Intervention(s): Monitored during session End of Session: Start Time:SLP Start Time (ACUTE ONLY): 1002  Stop Time: SLP Stop Time (ACUTE ONLY): 1014 Time Calculation:SLP Time Calculation (min) (ACUTE ONLY): 12 min Charges: SLP Evaluations $ SLP Speech Visit:  1 Visit SLP Evaluations $MBS Swallow: 1 Procedure SLP visit diagnosis: SLP Visit Diagnosis: Dysphagia, unspecified (R13.10) Past Medical History: Past Medical History: Diagnosis Date  Arthritis   oa  BPH (benign prostatic hyperplasia)   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  Cataracts, bilateral   Glaucoma   Hypertension   Vitamin D  deficiency  Past Surgical History: Past Surgical History: Procedure Laterality Date  CARDIOVERSION N/A 03/05/2024  Procedure: CARDIOVERSION;  Surgeon: Hazle Lites, MD;  Location: [facility] INVASIVE CV LAB;  Service: Cardiovascular;  Laterality: N/A;  CHEST TUBE INSERTION Right 03/18/2024  Procedure: CHEST TUBE INSERTION;  Surgeon: Guerry Leek, MD;  Location: [facility] ENDOSCOPY;  Service: Pulmonary;  Laterality: Right;  CHOLECYSTECTOMY    colonscopy  2017  IR THORACENTESIS ASP PLEURAL SPACE W/IMG GUIDE  03/03/2024  JOINT REPLACEMENT Right   hip   RIGHT HEART CATH N/A 03/23/2024  Procedure: RIGHT HEART CATH;  Surgeon: Darlis Eisenmenger, MD;  Location: [facility] INVASIVE CV LAB;  Service: Cardiovascular;  Laterality: N/A;  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  Procedure: LEFT TOTAL HIP ARTHROPLASTY ANTERIOR APPROACH;  Surgeon: Liliane Rei, MD;  Location: [facility] ORS;  Service: Orthopedics;  Laterality: Left;  VENTRAL HERNIA REPAIR N/A 07/12/2023  Procedure: HERNIA REPAIR VENTRAL;  Surgeon: Marny Sires Avon Boers, MD;  Location: [facility] OR;  Service: General;  Laterality: N/A; Amil Kale, M.A., CCC-SLP Speech Language Pathology, Acute Rehabilitation Services Secure Chat preferred [phone] 03/24/2024, 12:27 PM  DG Chest Port 1 View Result Date: 03/24/2024 CLINICAL DATA:  83 year old male with shortness of breath. Abnormal chest CT 3 days ago. EXAM: PORTABLE CHEST 1 VIEW COMPARISON:  Chest CT 03/21/2024 and earlier. FINDINGS: Portable AP semi  upright view at 0607 hours. A right lung base pleural pigtail catheter has been removed since the recent CT. Right lung pneumothorax with irregular pleural edge is visible in the apex has not significantly changed since 03/20/2024. And otherwise the lung volumes and bilateral ventilation have not significantly changed from the recent CT. Stable cardiac size and mediastinal contours. Visualized tracheal air column is within normal limits. Stable visualized osseous structures.  Negative visible bowel gas. IMPRESSION: Right pleural pigtail catheter removal, but otherwise no change in bilateral lung abnormality and ventilation from the CT on 03/21/2024 (please see that report). Unresolved right side pneumothorax has not significantly changed since 03/20/2024. Electronically Signed   By: Marlise Simpers M.D.   On: 03/24/2024 06:57   VAS US  LOWER EXTREMITY VENOUS (DVT) Result Date: 03/23/2024  Lower Venous DVT Study Patient Name:  JAMAURION SLEMMER  Date of Exam:   03/23/2024 Medical Rec #: [id number]        Accession #:    [id number] Date of Birth: 1941-08-03         Patient Gender: M Patient Age:   68 years Exam [facility]:  [facility] Procedure:      VAS US  LOWER EXTREMITY VENOUS (DVT) Referring Phys: Felton Hough --------------------------------------------------------------------------------  Indications: Edema.  Risk Factors: Cancer. Comparison Study: No prior studies. Performing Technologist: Lerry Ransom RVT  Examination Guidelines: A complete evaluation includes B-mode imaging, spectral Doppler, color Doppler, and power Doppler as needed of all accessible portions of each vessel. Bilateral testing is considered an integral part of a complete examination. Limited examinations for reoccurring indications may be performed as noted. The reflux portion of the exam is performed with the patient in reverse Trendelenburg.  +---------+---------------+---------+-----------+----------+--------------+ RIGHT     CompressibilityPhasicitySpontaneityPropertiesThrombus Aging +---------+---------------+---------+-----------+----------+--------------+ CFV  Full           Yes      Yes                                 +---------+---------------+---------+-----------+----------+--------------+ SFJ      Full                                                        +---------+---------------+---------+-----------+----------+--------------+ FV Prox  Full                                                        +---------+---------------+---------+-----------+----------+--------------+ FV Mid   Full                                                        +---------+---------------+---------+-----------+----------+--------------+ FV DistalFull                                                        +---------+---------------+---------+-----------+----------+--------------+ PFV      Full                                                        +---------+---------------+---------+-----------+----------+--------------+ POP      Full           Yes      Yes                                 +---------+---------------+---------+-----------+----------+--------------+ PTV      Full                                                        +---------+---------------+---------+-----------+----------+--------------+ PERO     Full                                                        +---------+---------------+---------+-----------+----------+--------------+   +---------+---------------+---------+-----------+----------+--------------+ LEFT     CompressibilityPhasicitySpontaneityPropertiesThrombus Aging +---------+---------------+---------+-----------+----------+--------------+ CFV      Full           Yes      Yes                                 +---------+---------------+---------+-----------+----------+--------------+  SFJ      Full                                                         +---------+---------------+---------+-----------+----------+--------------+ FV Prox  Full                                                        +---------+---------------+---------+-----------+----------+--------------+ FV Mid   Full                                                        +---------+---------------+---------+-----------+----------+--------------+ FV DistalFull                                                        +---------+---------------+---------+-----------+----------+--------------+ PFV      Full                                                        +---------+---------------+---------+-----------+----------+--------------+ POP      Full           Yes      Yes                                 +---------+---------------+---------+-----------+----------+--------------+ PTV      Full                                                        +---------+---------------+---------+-----------+----------+--------------+ PERO     Full                                                        +---------+---------------+---------+-----------+----------+--------------+     Summary: RIGHT: - There is no evidence of deep vein thrombosis in the lower extremity.  - No cystic structure found in the popliteal fossa.  LEFT: - There is no evidence of deep vein thrombosis in the lower extremity.  - No cystic structure found in the popliteal fossa.  *See table(s) above for measurements and observations. Electronically signed by Irvin Mantel on 03/23/2024 at 4:40:08 PM.    Final       Signature  -   Seena Dadds M.D on 03/25/2024 at 1:57 PM   -  To page go to [email or web address]

## 2024-03-25 NOTE — Progress Notes (Signed)
 Calorie Count Note  48 hour calorie count ordered. Day one now complete and findings below. Met with patient and different daughter this morning. Patient had consumed 75% of his breakfast meal and 100% of his Magic Cup.   Reviewed yesterday's education and discussion with patient and daughter. Discussed that he is eating well during calorie count window and likely meeting his needs, however concern for discharge and will he consistently meet his needs at home.   Family very interested in time frames. Asking questions along the lines of how long it will take to see an improvement in his clinical status and symptoms. Provided education around the variability of his disease state and metabolism, which cannot effectively answer those questions. Did reiterate that his nutrition status declined to this point over longer period of time and it would likely take just as long for it to improve.   Estimated Nutritional Needs:  Kcal:  1900-2100 kcals Protein:  100-110g Fluid:  >1.9L/day  Diet: Heart Healthy, thin liquids Supplements: Magic Cup x3, Ensure Plus High Protein x3, Prosource x2  Day 1: 03/24/2024 Breakfast: 90% omelette, 100% home fries, 100% blueberry muffin, and 75% coffee (426 kcals, 19g protein) Lunch: 100% cream of potato soup, 100% tuna salad sandwich, 100% sugar cookie, 100% ginger ale (665 kcals, 24g protein) Dinner: 80% herb roasted chicken, 100% baked sweet potato, 100% sliced carrots, and 50% potato soup, 70% ginger ale and 100% Magic Cup, 100% chocolate chip cookie (960kcals, 34g protein) Supplements: 1.5 cartons of Ensure Plus High Protein, Prosource x1 100% (625kcals, 45g protein)  Total intake DAY 1: 2676 kcal (127% of minimum estimated needs)  122 protein (120% of minimum estimated needs)  NUTRITION DIAGNOSIS:  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 - remains applicable   GOAL:  Patient will meet greater than or equal to 90% of their needs - progressing  INTERVENTION:  Continue 48-hour calorie count to assess current level of intake Continue 30 ml ProSource Plus BID, each supplement provides 100 kcals and 15 grams protein.   Add Ensure Plus High Protein po TID, each supplement provides 350 kcal and 20 grams of protein  Add Magic cup TID with meals, each supplement provides 290 kcal and 9 grams of protein  Add MVI w/ minerals Daily weight to assess trend r/t fluid shifts Education given regarding PEG tube and indications for placement Resources for modular protein supplements provided to purchase at home Monitor for PEG tube placement and ability to initiate tube feedings, if indicated  Con Decant MS, RD, LDN Registered Dietitian Clinical Nutrition RD Inpatient Contact Info in [place]

## 2024-03-26 ENCOUNTER — Inpatient Hospital Stay (HOSPITAL_COMMUNITY)

## 2024-03-26 ENCOUNTER — Telehealth: Payer: Self-pay | Admitting: Internal Medicine

## 2024-03-26 DIAGNOSIS — E43 Unspecified severe protein-calorie malnutrition: Secondary | ICD-10-CM | POA: Diagnosis not present

## 2024-03-26 DIAGNOSIS — J9601 Acute respiratory failure with hypoxia: Secondary | ICD-10-CM | POA: Diagnosis not present

## 2024-03-26 DIAGNOSIS — J90 Pleural effusion, not elsewhere classified: Secondary | ICD-10-CM | POA: Diagnosis not present

## 2024-03-26 DIAGNOSIS — I4891 Unspecified atrial fibrillation: Secondary | ICD-10-CM | POA: Diagnosis not present

## 2024-03-26 LAB — BASIC METABOLIC PANEL WITH GFR
Anion gap: 9 (ref 5–15)
BUN: 36 mg/dL — ABNORMAL HIGH (ref 8–23)
CO2: 33 mmol/L — ABNORMAL HIGH (ref 22–32)
Calcium: 9 mg/dL (ref 8.9–10.3)
Chloride: 95 mmol/L — ABNORMAL LOW (ref 98–111)
Creatinine, Ser: 1.16 mg/dL (ref 0.61–1.24)
GFR, Estimated: >60 mL/min (ref >60)
Glucose, Bld: 125 mg/dL — ABNORMAL HIGH (ref 70–99)
Potassium: 4.4 mmol/L (ref 3.5–5.1)
Sodium: 137 mmol/L (ref 135–145)

## 2024-03-26 LAB — CBC WITH DIFFERENTIAL/PLATELET
Abs Immature Granulocytes: 0.03 10*3/uL (ref 0.00–0.07)
Basophils Absolute: 0 10*3/uL (ref 0.0–0.1)
Basophils Relative: 0 %
Eosinophils Absolute: 0.1 10*3/uL (ref 0.0–0.5)
Eosinophils Relative: 2 %
HCT: 40.3 % (ref 39.0–52.0)
Hemoglobin: 12.1 g/dL — ABNORMAL LOW (ref 13.0–17.0)
Immature Granulocytes: 1 %
Lymphocytes Relative: 7 %
Lymphs Abs: 0.4 10*3/uL — ABNORMAL LOW (ref 0.7–4.0)
MCH: 28.7 pg (ref 26.0–34.0)
MCHC: 30 g/dL (ref 30.0–36.0)
MCV: 95.7 fL (ref 80.0–100.0)
Monocytes Absolute: 1.1 10*3/uL — ABNORMAL HIGH (ref 0.1–1.0)
Monocytes Relative: 17 %
Neutro Abs: 4.5 10*3/uL (ref 1.7–7.7)
Neutrophils Relative %: 73 %
Platelets: 132 10*3/uL — ABNORMAL LOW (ref 150–400)
RBC: 4.21 MIL/uL — ABNORMAL LOW (ref 4.22–5.81)
RDW: 14.2 % (ref 11.5–15.5)
WBC: 6.1 10*3/uL (ref 4.0–10.5)
nRBC: 0 % (ref 0.0–0.2)

## 2024-03-26 LAB — MAGNESIUM: Magnesium: 2.2 mg/dL (ref 1.7–2.4)

## 2024-03-26 MED ORDER — MIRTAZAPINE 15 MG PO TABS
7.5000 mg | ORAL_TABLET | Freq: Every day | ORAL | Status: DC
  Administered 2024-03-26: 7.5 mg via ORAL
  Filled 2024-03-26: qty 1

## 2024-03-26 MED ORDER — MIRTAZAPINE 15 MG PO TBDP: 7.5000 mg | ORAL_TABLET | Freq: Every day | ORAL | Status: DC

## 2024-03-26 NOTE — Plan of Care (Signed)
   Problem: Clinical Measurements: Goal: Ability to maintain clinical measurements within normal limits will improve Outcome: Progressing Goal: Will remain free from infection Outcome: Progressing Goal: Respiratory complications will improve Outcome: Progressing   Problem: Activity: Goal: Risk for activity intolerance will decrease Outcome: Progressing   Problem: Safety: Goal: Ability to remain free from injury will improve Outcome: Progressing   Problem: Skin Integrity: Goal: Risk for impaired skin integrity will decrease Outcome: Progressing

## 2024-03-26 NOTE — TOC Progression Note (Addendum)
 Transition of Care [facility]) - Progression Note    Patient Details  Name: Samuel Porter MRN: [id number] Date of Birth: 07-Nov-1940  Transition of Care [facility]) CM/SW Contact  Ernst Heap Phone Number: [phone] 03/26/2024, 11:12 AM  Clinical Narrative:   11:11 AM- HF CSW attempted to reach patients daughter by phone. CSW left a VM. CSW will continue to make attempts to make contact.   11:13 AM- HF CSW attempted to reach patients other daughter by phone. CSW left a VM. CSW will continue to make attempts to make contact.   HF CSW met with patient and daughter at bedside. CSW ex plained that the bed list offers from the SNF accepted bed list are the only options. CSW shared that [facility] has no bed availability. The patients daughter inquired about HH as an option. Patient and daughter said they will review bedlist options and make a SNF choice.   TOC will continue following.       Barriers to Discharge: Continued Medical Work up, Conservator, museum/gallery and Services                                               Social Determinants of Health (SDOH) Interventions SDOH Screenings   Food Insecurity: Patient Declined (03/17/2024)  Housing: Low Risk  (11/30/2022)  Transportation Needs: No Transportation Needs (02/04/2024)   Received from [facility]  Utilities: Low Risk  (02/04/2024)   Received from [facility]  Depression (PHQ2-9): Low Risk  (11/30/2022)  Tobacco Use: Medium Risk (03/23/2024)    Readmission Risk Interventions     No data to display

## 2024-03-26 NOTE — TOC Progression Note (Signed)
 Transition of Care [facility]) - Progression Note    Patient Details  Name: Samuel Porter MRN: [id number] Date of Birth: [birth year]  [facility]) CM/SW Contact  Benjiman Bras, RN Phone Number: [phone] 03/26/2024, 4:42 PM  Clinical Narrative:     TOC CM spoke to dtr, Raeanne Bull. Pt provided permission. Offered choice for [facility]. States pt's wife had [facility] in the past. Pt has needed DME. Family will hire a caregiver to stay with pt.  Contacted [facility] rep, Randel Buss states they no longer have contract with [employer] since 10/16/2023.  Contacted [facility] rep, Kelly with new referral for [facility].  Pt will need oxygen for home. Will have Unit RN check walking oxygen sat and document.   Expected Discharge Plan: Home w Home Health Services Barriers to Discharge: Continued Medical Work up  Expected Discharge Plan and Services   Discharge Planning Services: CM Consult Post Acute Care Choice: Home Health Living arrangements for the past 2 months: Single Family Home                           HH Arranged: RN, PT, OT [facility] Agency: [facility] Date [facility] Agency Contacted: 03/26/24 Time HH Agency Contacted: [id number] Representative spoke with at [facility] Agency: General [facility]   Social Determinants of Health (SDOH) Interventions SDOH Screenings   Food Insecurity: Patient Declined (03/17/2024)  Housing: Low Risk  (11/30/2022)  Transportation Needs: No Transportation Needs (02/04/2024)   Received from [facility]  Utilities: Low Risk  (02/04/2024)   Received from [facility]  Depression (PHQ2-9): Low Risk  (11/30/2022)  Tobacco Use: Medium Risk (03/23/2024)    Readmission Risk Interventions     No data to display

## 2024-03-26 NOTE — Progress Notes (Signed)
 Physical Therapy Treatment [name] MRN: [id number] DOB: Feb 26, 1941 Today's Date: 03/26/2024   History of Present Illness 83 year old man presented 6/2 from pulmonary clinic with worsening dyspnea exertion, hypoxemia, lower extremity swelling, and A-fib with RVR concerning for CHF exacerbation with recurrent right greater than left pleural effusion, and AKI.  PMHx includes BPH, glaucoma, hypertension, vitamin D  deficiency, atrial fibrillation on anticoagulation with recent cardioversion ~2 wks ago.    PT Comments  Pt received standing up from toilet after pulling wall bell without waiting for staff to arrive, pt reminded to wait for assist prior to mobilizing and RN/family notified he will need constant supervision/assist for safety acutely and post-acute given decreased overall safety awareness. Pt needing up to modA for stand>sit safety without AD due to anterior loss of balance while sitting with fatigue, needing heavy assist at gait belt to recover and guide safely back to sit in chair. Pt needing up to minA for safety with gait trial without AD, with reinforcement with pt/family that he will need to use RW at all times due to continue high fall risk and will need PRN physical assist post-acute. Pt remains a high fall risk and needing at least 2L O2 [place] for exertional tasks, RN notified he will need additional ambulatory saturation test as he may need >2L with exertional tasks. Patient will benefit from continued inpatient follow up therapy, <3 hours/day, but if 24/7 physical assist/supervision available at home, pt may be able to consider DC home with HHPT services.    If plan is discharge home, recommend the following: A little help with walking and/or transfers;A little help with bathing/dressing/bathroom;Assistance with cooking/housework;Assist for transportation;Help with stairs or ramp for entrance;Supervision due to cognitive status   Can travel by private vehicle      Yes  Equipment Recommendations  None recommended by PT (rec pt use RW at home, likely to need supplemental O2 with activity)    Recommendations for Other Services       Precautions / Restrictions Precautions Precautions: Fall Recall of Precautions/Restrictions: Intact Precaution/Restrictions Comments: Noisy SpO2 signal on fingers Restrictions Weight Bearing Restrictions Per Provider Order: No     Mobility  Bed Mobility               General bed mobility comments: pt requesting to sit in chair    Transfers Overall transfer level: Needs assistance Equipment used: None (wall rail) Transfers: Sit to/from Stand Sit to Stand: Mod assist           General transfer comment: pt stood modI from toilet using wall rail just prior to staff arriving to bathroom; when returning to room after gait trial, pt needing up to modA due to anterior loss of balance with pt not locating chair arm rest prior to BLE fatigue and neeidng modA to prevent anterior fall in front of chair. Heavy emphasis with pt and family on need for constant supervision/assist if he instead DC home instead of SNF due to pt decreased overall safety awareness and continued fall risk.    Ambulation/Gait Ambulation/Gait assistance: Contact guard assist, Min assist Gait Distance (Feet): 125 Feet (x2 with prolonged standing rest break) Assistive device: 1 person hand held assist Gait Pattern/deviations: Step-through pattern, Decreased stride length, Drifts right/left, Narrow base of support, Decreased dorsiflexion - right, Decreased dorsiflexion - left Gait velocity: dec     General Gait Details: mod cues for posture/forward gaze, directional navigation adn activity pacing/pursed-lip breathing. SpO2 90-94% until halfway through trial on  RA, when pt needing cues for pursed-lip breathing and standing rest break x3 to maintain SpO2 88% and above. toward end of gait trial, SpO2 decreased to 86% on RA final 29ft and  decreased further once seated to 84% despite cues for pursed-lip breathing so 2L O2 [place] reapplied, RN notified he will need ambulatory saturation test to determine how many L of O2 will be needed for home mobility.   Stairs             Wheelchair Mobility     Tilt Bed    Modified Rankin (Stroke Patients Only)       Balance Overall balance assessment: Needs assistance Sitting-balance support: No upper extremity supported Sitting balance-Leahy Scale: Good     Standing balance support: No upper extremity supported, During functional activity Standing balance-Leahy Scale: Poor (fair static standing, poor dynamic) Standing balance comment: fair for short distance unsupported, CGA for safety but no overt LOB; poor with fatigue for dynamic standing tasks                            Communication Communication Communication: No apparent difficulties  Cognition Arousal: Alert Behavior During Therapy: Impulsive   PT - Cognitive impairments: Safety/Judgement                       PT - Cognition Comments: Pt instructed on call bell use but impulsive to stand wtihout being cued; pt had stood in bathroom after pulling wall bell wtihout waiting for staff to arrive to assist him Following commands: Intact      Cueing Cueing Techniques: Verbal cues  Exercises Other Exercises Other Exercises: pursed lip breathing x5 reps x4 sets during standing breaks while pt ambulating Other Exercises: pt and family given standing and supine HEP handout with specification written at top that pt is NOT to perform standing exercises without constant +1 physical assist for safety    General Comments General comments (skin integrity, edema, etc.): SpO2 desat to 86% after longer household distance gait trial, then 84% once seated in chair, RN notified, 2L O2 [place] reapplied once back in chair.      Pertinent Vitals/Pain Pain Assessment Pain Assessment: No/denies pain Faces Pain  Scale: No hurt Pain Intervention(s): Monitored during session, Repositioned    Home Living                          Prior Function            PT Goals (current goals can now be found in the care plan section) Acute Rehab PT Goals Patient Stated Goal: To go home PT Goal Formulation: With patient Time For Goal Achievement: 03/31/24 Progress towards PT goals: Progressing toward goals    Frequency    Min 2X/week      PT Plan      Co-evaluation              AM-PAC PT 6 Clicks Mobility   Outcome Measure  Help needed turning from your back to your side while in a flat bed without using bedrails?: None Help needed moving from lying on your back to sitting on the side of a flat bed without using bedrails?: A Little Help needed moving to and from a bed to a chair (including a wheelchair)?: A Little Help needed standing up from a chair using your arms (e.g., wheelchair or bedside chair)?: A Little  Help needed to walk in hospital room?: A Little Help needed climbing 3-5 steps with a railing? : A Little 6 Click Score: 19    End of Session Equipment Utilized During Treatment: Gait belt;Oxygen Activity Tolerance: Patient tolerated treatment well;Other (comment) (needs further ambulatory oxygen saturation assessment to ensure proper amount of O2 for home) Patient left: Other (comment);in chair;with chair alarm set;with family/visitor present (sister and daughter present) Nurse Communication: Mobility status;Precautions;Other (comment) (hypoxia with exertion) PT Visit Diagnosis: Other abnormalities of gait and mobility (R26.89);Muscle weakness (generalized) (M62.81)     Time: 6387-5643 PT Time Calculation (min) (ACUTE ONLY): 15 min  Charges:    $Gait Training: 8-22 mins $Therapeutic Activity: 8-22 mins PT General Charges $$ ACUTE PT VISIT: 1 Visit                     Kharisma Glasner P., PTA Acute Rehabilitation Services Secure Chat Preferred 9a-5:30pm Office:  [phone]    Mariel Shope [facility] 03/26/2024, 6:21 PM

## 2024-03-26 NOTE — Progress Notes (Signed)
 Calorie Count Note   48 hour calorie count ordered. Day two now complete and findings below. Given the snapshot of last two day's worth of intake, could do without PEG tube placement if he can continue to consistently meet estimated calorie and protein needs. Estimated needs may need adjusting as weight trends, nutrition focused physical exam, and symptoms indicate. Given that he is documented with pitting edema to BLEs, true body weight likely lower than what is currently documented.   Discussed calorie count findings with patient at bedside and reiterated importance of consistently meeting his needs s/p discharge. He verbalizes understanding.     Estimated Nutritional Needs:  Kcal:  1900-2100 kcals Protein:  100-110g Fluid:  >1.9L/day   Diet: Heart Healthy, thin liquids Supplements: Magic Cup x3, Ensure Plus High Protein x3, Prosource x2   Day 2: 03/25/2024 Breakfast: 75% of omelette, home fried potatoes, English Muffin and 100% of Magic Cup (552 kcals, 17g protein) Lunch: 100% chicken noodle soup, 50% tuna salad sandwich, 50% Magic Cup (340 kcals, 22g protein) Dinner:  75% herb roasted chicken, 75% baked potato, 100% sliced carrots, 100% chocolate chip cookie and 100% Magic Cup (546 kcals, 33g protein) Supplements: 3 cartons of Ensure Plus High Protein, Prosource x2 100% (1210 kcals,  90g protein)   Total intake DAY 2: 2648 kcal (139% of minimum estimated needs)  162g protein (162% of minimum estimated needs)  Total intake DAY 1: 2676 kcal (127% of minimum estimated needs)  122g protein (120% of minimum estimated needs)   NUTRITION DIAGNOSIS:  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 - remains applicable   GOAL:  Patient will meet greater than or equal to 90% of their needs - progressing   INTERVENTION:  Discontinue 48-hour calorie  count as it is not complete Continue 30 ml ProSource Plus BID, each supplement provides 100 kcals and 15 grams protein.   Continue Ensure Plus High Protein po TID, each supplement provides 350 kcal and 20 grams of protein  Continue Magic cup TID with meals, each supplement provides 290 kcal and 9 grams of protein  Continue MVI w/ minerals Daily weight to assess trend r/t fluid shifts  Con Decant MS, RD, LDN Registered Dietitian Clinical Nutrition RD Inpatient Contact Info in [place]

## 2024-03-26 NOTE — Progress Notes (Signed)
 Physical Therapy Treatment [name] MRN: [id number] DOB: 1941-10-11 Today's Date: 03/26/2024   History of Present Illness 83 year old man presented 6/2 from pulmonary clinic with worsening dyspnea exertion, hypoxemia, lower extremity swelling, and A-fib with RVR concerning for CHF exacerbation with recurrent right greater than left pleural effusion, and AKI.  PMHx includes BPH, glaucoma, hypertension, vitamin D  deficiency, atrial fibrillation on anticoagulation with recent cardioversion ~2 wks ago.    PT Comments  Pt received in room, starting to stand from chair wtihout AD, c/o urgency to use bathroom. PTA arrived to assist and providing CGA for safety without AD use, SpO2 90-94% on RA with short distance gait trial. Pt agreeable to use bathroom wall call bell once done on toilet and requesting >5 mins in bathroom, family arriving standing outside his room and agreeable to notify staff if he gets up unassisted, RN notified. Patient will benefit from continued inpatient follow up therapy, <3 hours/day, although family reports they may consider HHPT pending pt progress next couple of days.     If plan is discharge home, recommend the following: A little help with walking and/or transfers;A little help with bathing/dressing/bathroom;Assistance with cooking/housework;Assist for transportation;Help with stairs or ramp for entrance;Supervision due to cognitive status   Can travel by private vehicle     Yes  Equipment Recommendations  None recommended by PT (rec pt use RW at home, likely to need supplemental O2 with activity)    Recommendations for Other Services       Precautions / Restrictions Precautions Precautions: Fall Recall of Precautions/Restrictions: Intact Precaution/Restrictions Comments: Noisy SpO2 signal on fingers Restrictions Weight Bearing Restrictions Per Provider Order: No     Mobility  Bed Mobility               General bed mobility  comments: received standing from chair unassisted    Transfers Overall transfer level: Needs assistance Equipment used: None Transfers: Sit to/from Stand Sit to Stand: Supervision, Contact guard assist           General transfer comment: supervision standing initally from chair and CGA to sit to toilet due to decreased safety and awareness of seat position to lower toilet surface, cues for wall rail use and UE placement    Ambulation/Gait Ambulation/Gait assistance: Contact guard assist Gait Distance (Feet): 25 Feet Assistive device: 1 person hand held assist Gait Pattern/deviations: Step-through pattern, Decreased stride length Gait velocity: dec     General Gait Details: distance limited due to pt c/o bowel urgency; defer RW use given pt impulsivity to see how his balance is unsupported   Optometrist     Tilt Bed    Modified Rankin (Stroke Patients Only)       Balance Overall balance assessment: Needs assistance Sitting-balance support: No upper extremity supported Sitting balance-Leahy Scale: Good     Standing balance support: No upper extremity supported, During functional activity Standing balance-Leahy Scale: Fair Standing balance comment: fair for short distance unsupported, CGA for safety but no overt LOB; pt intermittently reaching for counter/furniture                            Communication Communication Communication: No apparent difficulties  Cognition Arousal: Alert Behavior During Therapy: WFL for tasks assessed/performed, Impulsive   PT - Cognitive impairments: Safety/Judgement  PT - Cognition Comments: Pt instructed on call bell use but impulsive to stand wtihout being cued; pt had removed tele and SpO2 wires prior to PTA arriving to room due to c/o bowel urgency. Following commands: Intact      Cueing Cueing Techniques: Verbal cues  Exercises Other  Exercises Other Exercises: defer as pt needing to use bathroom    General Comments General comments (skin integrity, edema, etc.): SpO2 90-94% on RA for short distance gait trial in room; HR 50's bpm, which is typical for him      Pertinent Vitals/Pain Pain Assessment Pain Assessment: No/denies pain Faces Pain Scale: No hurt Pain Intervention(s): Monitored during session, Repositioned    Home Living                          Prior Function            PT Goals (current goals can now be found in the care plan section) Acute Rehab PT Goals Patient Stated Goal: To go home PT Goal Formulation: With patient Time For Goal Achievement: 03/31/24 Progress towards PT goals: Progressing toward goals    Frequency    Min 2X/week      PT Plan      Co-evaluation              AM-PAC PT 6 Clicks Mobility   Outcome Measure  Help needed turning from your back to your side while in a flat bed without using bedrails?: None Help needed moving from lying on your back to sitting on the side of a flat bed without using bedrails?: A Little Help needed moving to and from a bed to a chair (including a wheelchair)?: A Little Help needed standing up from a chair using your arms (e.g., wheelchair or bedside chair)?: A Little Help needed to walk in hospital room?: A Little Help needed climbing 3-5 steps with a railing? : A Little 6 Click Score: 19    End of Session Equipment Utilized During Treatment: Gait belt Activity Tolerance: Patient tolerated treatment well;Other (comment) (bowel urgency/need for toileting) Patient left: with call bell/phone within reach;with family/visitor present;Other (comment) (pt on toilet, RN aware, family standing just outside his room, pt agreeable to pull wall bell prior to getting up.) Nurse Communication: Mobility status;Other (comment) (pt in bathroom) PT Visit Diagnosis: Other abnormalities of gait and mobility (R26.89);Muscle weakness  (generalized) (M62.81)     Time: 4098-1191 PT Time Calculation (min) (ACUTE ONLY): 8 min  Charges:    $Therapeutic Activity: 8-22 mins PT General Charges $$ ACUTE PT VISIT: 1 Visit                     Joyclyn Plazola P., PTA Acute Rehabilitation Services Secure Chat Preferred 9a-5:30pm Office: [phone]    Mariel Shope [facility] 03/26/2024, 6:08 PM

## 2024-03-26 NOTE — Telephone Encounter (Signed)
 2 week hospital f/u with TP or PM with CXR: if recurrent symptomatic effusion can set up pleurX or intermittent thora w/ me  Ardelle Kos MD PCCM

## 2024-03-26 NOTE — Progress Notes (Signed)
 03/26/2024  Seen in f/u for SOB  S: No events. Breathing well. Calorie count ongoing.  O:    03/26/2024    8:11 AM 03/26/2024    5:00 AM 03/26/2024    4:37 AM  Vitals with BMI  Weight  157 lbs 10 oz   BMI  20.8   Systolic 120  120  Diastolic 53  63  Pulse 60  54  No distress Appears a little stronger and more alert each day Breath sounds remain reduced R base Moves to command RASS 0  A: Acute on chronic hypoxemic respiratory failure, FTT, weight loss, entrapped R lung, severe protein calorie manutrition POA (see discussion 03/24/24)  P: Await calorie counts to determine if he would benefit from PEG Family has questions regarding how to maintain euvolemia at home, have asked AHF team to weigh in RE: amyloid or other myeodysplasia consideration for weight loss, 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  Primary to consider consult to heme/onc for bone marrow Regarding pleural space, no indication to drain based on symptoms or CXR, will schedule 2 week f/u appt in office, if symptomatic reaccumulation can offer pleurX or intermittent thoras in endo at patient and family's discretion Will be available PRN, I called and updated daughter Cleotha Dage MD Pulmonary Critical Care Medicine [employer] if during day (7a-7p) [phone] if after hours (7p-7a)

## 2024-03-26 NOTE — Progress Notes (Addendum)
 Advanced Heart Failure Rounding Note  Cardiologist: Arnoldo Lapping, MD  Chief Complaint: PHTN Subjective:    6/9 RHC: RA 4, PAP 34/18 (25), PCWP 13, CO/CI (fick) 4.59/2.34, PVR 2.6w 6/10: DCCV>NSR 6/11 cMRI: LVEF 51%, not highly suggestive of cardiac amyloid.   Remains SB in 40-50s. Off Midodrine , BP stable. CXR with persistent RLL effusion.  Sitting up in chair. Feeling well this morning. No SOB, dizziness, or CP. Working with PT  Objective:    Weight Range: 71.5 kg Body mass index is 20.8 kg/m.   Vital Signs:   Temp:  [97.6 F (36.4 C)-97.9 F (36.6 C)] 97.6 F (36.4 C) (06/12 0437) Pulse Rate:  [54-57] 54 (06/12 0437) Resp:  [13-24] 18 (06/12 0437) BP: (107-131)/(52-80) 120/63 (06/12 0437) SpO2:  [92 %-99 %] 97 % (06/12 0437) Weight:  [71.5 kg] 71.5 kg (06/12 0500) Last BM Date : 03/19/24  Weight change: Filed Weights   03/22/24 1713 03/25/24 0500 03/26/24 0500  Weight: 73.2 kg 72.3 kg 71.5 kg   Intake/Output:  Intake/Output Summary (Last 24 hours) at 03/26/2024 0802 Last data filed at 03/26/2024 7564 Gross per 24 hour  Intake --  Output 710 ml  Net -710 ml    Physical Exam    General: Cachetic, frail appearing. No distress on [place] Cardiac: JVP flat. S1 and S2 present. No murmurs or rub. Resp: RLL, RML absent. Upper lobes clear Extremities: Warm and dry.  3+ pitting ankle edema.  Neuro: Alert and oriented x3. Affect pleasant.   Telemetry   SB in 50s (personally reviewed)  EKG    No new EKG to review  Labs    CBC Recent Labs    03/24/24 0510 03/25/24 0552  WBC 5.9 6.0  NEUTROABS 4.4 4.5  HGB 12.3* 11.9*  HCT 39.0 38.6*  MCV 93.5 94.6  PLT 153 146*   Basic Metabolic Panel Recent Labs    33/29/51 0510 03/25/24 0552  NA 138 137  K 4.1 4.1  CL 94* 95*  CO2 35* 37*  GLUCOSE 92 138*  BUN 42* 40*  CREATININE 1.17 1.17  CALCIUM 8.5* 8.9  MG 2.2 2.2   BNP (last 3 results) Recent Labs    03/19/24 0427 03/20/24 0614  03/21/24 0548  BNP 1,086.3* 1,050.7* 1,089.6*   ProBNP (last 3 results) Recent Labs    03/16/24 1238  PROBNP 7,160.0*   Imaging   MR CARDIAC VELOCITY FLOW MAP Result Date: 03/26/2024 CLINICAL DATA:  Cardiomyopathy of uncertain etiology, assess for amyloidosis EXAM: CARDIAC MRI TECHNIQUE: The patient was scanned on a 1.5 Tesla GE magnet. A dedicated cardiac coil was used. Functional imaging was done using Fiesta sequences. 2,3, and 4 chamber views were done to assess for RWMA's. Modified Simpson's rule using a short axis stack was used to calculate an ejection fraction on a dedicated work [employer]. The patient received 8 cc of Gadavist. After 10 minutes inversion recovery sequences were used to assess for infiltration and scar tissue. FINDINGS: Limited images of the lung fields show a partially loculated-appearing moderate right pleural effusion. There is a small left pleural effusion. Airspace disease at bilateral bases. Small circumferential pericardial effusion, no tamponade. Mildly dilated left ventricular size with normal wall thickness. Global hypokinesis, LV EF 51%. Moderately dilated right ventricle, RV EF 47%. Severe biatrial enlargement. Trileaflet aortic valve. Trivial mitral regurgitation, regurgitant fraction 7%. Difficult delayed enhancement images. There is small area of mid-wall late gadolinium enhancement (LGE) at the basal-mid inferoseptal RV insertion site.  Small area of <50% wall thickness subendocardial LGE at the basal inferolateral wall segment. MEASUREMENTS: MEASUREMENTS LVEDV 230 mL LVEDVi 119 mL/m2 LVSV 117 LVEF 51% RVEDV 279 mL RVEDVi 144 mL/m2 RVSV 131 mL RVEF 47% Aortic forward volume 109 mL Aortic regurgitant fraction 0% Global T1 1119, ECV 30% Global T2 58 (elevated) IMPRESSION: 1. Lung findings as above, patient had recent CT chest with complete visualization of lung fields. 2.  Small pericardial effusion without tamponade. 3.  Mildly dilated LV with  LV EF 51%, global hypokinesis. 4.  Moderately dilated RV with RV EF 47% (mildly decreased). 5.  Severe biatrial enlargement. 6. Nonspecific inferior RV insertion site LGE, this is nonspecific and can be seen with pressure/volume overload. Small area of <50% wall thickness subendocardial LGE at the basal inferolateral wall, possible coronary disease pattern. 7.  Borderline elevated extracellular volume percentage at 30%. 8.  Elevated global T2, can be seen with inflammation. Study is not highly suggestive of cardiac amyloidosis. Samuel Porter Electronically Signed   By: [name] M.D.   On: 03/26/2024 07:47   MR CARDIAC VELOCITY FLOW MAP Result Date: 03/26/2024 CLINICAL DATA:  Cardiomyopathy of uncertain etiology, assess for amyloidosis EXAM: CARDIAC MRI TECHNIQUE: The patient was scanned on a 1.5 Tesla GE magnet. A dedicated cardiac coil was used. Functional imaging was done using Fiesta sequences. 2,3, and 4 chamber views were done to assess for RWMA's. Modified Simpson's rule using a short axis stack was used to calculate an ejection fraction on a dedicated work [employer], [employer]. The patient received 8 cc of Gadavist. After 10 minutes inversion recovery sequences were used to assess for infiltration and scar tissue. FINDINGS: Limited images of the lung fields show a partially loculated-appearing moderate right pleural effusion. There is a small left pleural effusion. Airspace disease at bilateral bases. Small circumferential pericardial effusion, no tamponade. Mildly dilated left ventricular size with normal wall thickness. Global hypokinesis, LV EF 51%. Moderately dilated right ventricle, RV EF 47%. Severe biatrial enlargement. Trileaflet aortic valve. Trivial mitral regurgitation, regurgitant fraction 7%. Difficult delayed enhancement images. There is small area of mid-wall late gadolinium enhancement (LGE) at the basal-mid inferoseptal RV insertion site. Small area of <50% wall thickness  subendocardial LGE at the basal inferolateral wall segment. MEASUREMENTS: MEASUREMENTS LVEDV 230 mL LVEDVi 119 mL/m2 LVSV 117 LVEF 51% RVEDV 279 mL RVEDVi 144 mL/m2 RVSV 131 mL RVEF 47% Aortic forward volume 109 mL Aortic regurgitant fraction 0% Global T1 1119, ECV 30% Global T2 58 (elevated) IMPRESSION: 1. Lung findings as above, patient had recent CT chest with complete visualization of lung fields. 2.  Small pericardial effusion without tamponade. 3.  Mildly dilated LV with LV EF 51%, global hypokinesis. 4.  Moderately dilated RV with RV EF 47% (mildly decreased). 5.  Severe biatrial enlargement. 6. Nonspecific inferior RV insertion site LGE, this is nonspecific and can be seen with pressure/volume overload. Small area of <50% wall thickness subendocardial LGE at the basal inferolateral wall, possible coronary disease pattern. 7.  Borderline elevated extracellular volume percentage at 30%. 8.  Elevated global T2, can be seen with inflammation. Study is not highly suggestive of cardiac amyloidosis. Samuel Porter Electronically Signed   By: [name] M.D.   On: 03/26/2024 07:46   MR CARDIAC MORPHOLOGY W WO CONTRAST Result Date: 03/26/2024 CLINICAL DATA:  Cardiomyopathy of uncertain etiology, assess for amyloidosis EXAM: CARDIAC MRI TECHNIQUE: The patient was scanned on a 1.5 Tesla GE magnet. A dedicated cardiac coil was used. Functional  imaging was done using Fiesta sequences. 2,3, and 4 chamber views were done to assess for RWMA's. Modified Simpson's rule using a short axis stack was used to calculate an ejection fraction on a dedicated work [employer], [employer]. The patient received 8 cc of Gadavist. After 10 minutes inversion recovery sequences were used to assess for infiltration and scar tissue. FINDINGS: Limited images of the lung fields show a partially loculated-appearing moderate right pleural effusion. There is a small left pleural effusion. Airspace disease at bilateral bases. Small  circumferential pericardial effusion, no tamponade. Mildly dilated left ventricular size with normal wall thickness. Global hypokinesis, LV EF 51%. Moderately dilated right ventricle, RV EF 47%. Severe biatrial enlargement. Trileaflet aortic valve. Trivial mitral regurgitation, regurgitant fraction 7%. Difficult delayed enhancement images. There is small area of mid-wall late gadolinium enhancement (LGE) at the basal-mid inferoseptal RV insertion site. Small area of <50% wall thickness subendocardial LGE at the basal inferolateral wall segment. MEASUREMENTS: MEASUREMENTS LVEDV 230 mL LVEDVi 119 mL/m2 LVSV 117 LVEF 51% RVEDV 279 mL RVEDVi 144 mL/m2 RVSV 131 mL RVEF 47% Aortic forward volume 109 mL Aortic regurgitant fraction 0% Global T1 1119, ECV 30% Global T2 58 (elevated) IMPRESSION: 1. Lung findings as above, patient had recent CT chest with complete visualization of lung fields. 2.  Small pericardial effusion without tamponade. 3.  Mildly dilated LV with LV EF 51%, global hypokinesis. 4.  Moderately dilated RV with RV EF 47% (mildly decreased). 5.  Severe biatrial enlargement. 6. Nonspecific inferior RV insertion site LGE, this is nonspecific and can be seen with pressure/volume overload. Small area of <50% wall thickness subendocardial LGE at the basal inferolateral wall, possible coronary disease pattern. 7.  Borderline elevated extracellular volume percentage at 30%. 8.  Elevated global T2, can be seen with inflammation. Study is not highly suggestive of cardiac amyloidosis. Samuel Porter Electronically Signed   By: [name] M.D.   On: 03/26/2024 07:46   DG Chest Port 1 View Result Date: 03/26/2024 CLINICAL DATA:  Shortness of breath. EXAM: PORTABLE CHEST 1 VIEW COMPARISON:  03/24/2024 FINDINGS: Right apical pneumothorax seen on the previous study has become less prominent in the interval with no discrete pleural line visible on the current exam. Diffuse interstitial opacity persists in a similar  fashion with right base collapse/consolidation/scarring in right pleural effusion, similar to prior. The cardio pericardial silhouette is enlarged. Telemetry leads overlie the chest. IMPRESSION: 1. Interval decrease in prominence of right apical pneumothorax. 2. No other significant interval change. Electronically Signed   By: Donnal Fusi M.D.   On: 03/26/2024 06:18   Medications:    Scheduled Medications:  (feeding supplement) PROSource Plus  30 mL Oral BID AC   amiodarone   200 mg Oral Daily   apixaban   5 mg Oral BID   cholecalciferol   1,000 Units Oral Daily   dapagliflozin  propanediol  10 mg Oral Daily   digoxin  0.0625 mg Oral Daily   docusate sodium   100 mg Oral BID   dorzolamide -timolol   1 drop Both Eyes BID   feeding supplement  237 mL Oral TID BM   furosemide   20 mg Oral Daily   levothyroxine   50 mcg Oral Q0600   mirtazapine  7.5 mg Oral QHS   multivitamin with minerals  1 tablet Oral Daily   Infusions:  PRN Medications: acetaminophen  **OR** acetaminophen , albuterol , bisacodyl , hydrALAZINE , ondansetron  **OR** ondansetron  (ZOFRAN ) IV, oxyCODONE , polyethylene glycol  Patient Profile   Samuel Porter is a 83 y.o. male with  a PMH of squamous cell carcinoma treated with PD-1, HFrEF, afib, HTN. HF following for pHTN.   Assessment/Plan   1. 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  Echo this admission with EF 30-35%, moderate RV enlargement/severe RV dysfunction with D-shaped septum, PASP 70 mmHg.  Etiology unclear. PD-1 blocker (cemiplimab ) can be associated with pulmonary hypertension, but rare. 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 Decreased LV systolic function may be due in part to septal shift/RV dysfunction and in part due to atrial fibrillation. Troponin minimally elevated with no trend.  RHC on 6/9 showed normal filling pressures, preserved cardiac output, and only borderline elevated PA  pressure. CMR yesterday showed LVEF 51%, not highly suggestive of cardiac amyloid.  - Off Midodrine , BP stable.  - Continue Lasix  20 mg daily. Euvolemic.  - Persistent ankle edema, have ordered UNNA boots, but have not been placed. Will place TED hose in the meantime.  - Continue Farxiga  10 mg daily.  - Holding ? blocker with RV dysfunction and bradycardia.  - Stop digoxin with bradycardia. No dizziness reported, will check orthostatic vital signs today.  2.  Right pleural effusion: Chronic/recurrent.  Multiple thoracenteses, fluid cytology has been negative.  Now with trapped lung. Do suspect some cardiac involvement, but his degree of LV dysfunction seems out of proportion and with predominatly RV failure and no significant MR his persistent effusion is not easily explained.  Right chest tube removed. RHC 6/9 showed normal filling pressures, do not think that CHF is currently driving his effusion. CXR with persistent hydropneumothorax on right, Pulm edema improving.  - Breath sounds remain absent in RLL and RML on auscultation 3. 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  Pleural fluid cytology has been negative. S/p immunotherapy (cemiplimab ).  4. Atrial fibrillation: Persistent.  DCCV in 5/25 but recurred. Patient had successful DCCV to NSR yesterday. Now with sinus brady in 50s on tele, asymptomatic.  - Continue amio 200 mg daily. Ultimately not a great long-term medication for him with his pulmonary problems. CTM HR.  - Continue eliquis  5 mg bid  5. Pulmonary: Right recurrent pleural effusion as above with trapped lung. 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   Length of Stay: 10  [place] Lee, NP  03/26/2024, 8:02 AM  Advanced Heart Failure Team Pager [phone] (M-F; 7a - 5p)  Please contact [facility] for night-coverage after hours (5p -7a ) and weekends on [email or web address]  Patient seen with NP, I formulated the plan and agree with the above note.    No complaints today, breathing is ok.  Creatinine stable.  HR to 40s at times, sinus brady.  Generally in 50s.   Cardiac MRI with mildly dilated LV and RV, LV EF 51% and RV EF 47%.  Findings not highly suggestive of cardiac amyloidosis.   General: NAD Neck: JVP 8 cm, no thyromegaly or thyroid  nodule.  Lungs: Decreased on right.  CV: Nondisplaced PMI.  Heart regular S1/S2, no S3/S4, no murmur.  1+ ankle peripheral edema.   Abdomen: Soft, nontender, no hepatosplenomegaly, no distention.  Skin: Intact without lesions or rashes.  Neurologic: Alert and oriented x 3.  Psych: Normal affect. Extremities: No clubbing or cyanosis.  HEENT: Normal.    Stable from cardiac standpoint, he remains in NSR.  With bradycardia, will stop digoxin but continue amiodarone .  Volume status looks ok.   Mild biventricular cardiomyopathy.  Cardiac MRI is  NOT highly suggestive of cardiac amyloidosis.  Pending myeloma panel/urine immunofixation.   Loculated right effusion, plan per pulmonary.   OK for home from my perspective, will arrange followup.   [name] 03/26/2024 10:07 AM

## 2024-03-26 NOTE — Progress Notes (Signed)
 SATURATION QUALIFICATIONS: (This note is used to comply with regulatory documentation for home oxygen)  Patient Saturations on Room Air at Rest = 91%  Patient Saturations on Room Air while Ambulating = 86%  Patient Saturations on 4 Liters of oxygen while Ambulating = 94%  Please briefly explain why patient needs home oxygen:  Patient unable to maintain appropriate oxygen saturation without the aid of supplemental oxygen.

## 2024-03-26 NOTE — Progress Notes (Signed)
 PROGRESS NOTE                                                                                                                                                                                                             Patient Demographics:    Samuel Porter, is a 83 y.o. male, DOB - 11-16-1940, KGM:[id number]  Outpatient Primary MD for the patient is [name], MD    LOS - 10  Admit date - 03/16/2024    Chief Complaint  Patient presents with   Shortness of Breath       Brief Narrative (HPI from H&P)     82 y.o. male with medical history significant for but not limited to BPH, glaucoma, hypertension, vitamin D  deficiency, atrial fibrillation on anticoagulation with recent cardioversion about 2 weeks ago, history of pleural effusions and other comorbidities who presents with worsening shortness of breath last 3 days.  Patient states that he started developing lower extremity edema that progressively got worse as well as worsening shortness of breath that started 3 days ago.  He states that he was at home and is monitoring his O2 saturations and his O2 saturation for 3 days were about 95% on room air and subsequently continued to drop over the 3 days.  Patient went to go see his pulmonologist today and is found to be hypoxic on room air so they directly admitted him to hospital for further evaluation.   In the ER workup consistent with A-fib RVR, acute on chronic diastolic CHF, recurrent pleural effusion right more than left, he also had hypoxic respiratory failure and was admitted to the hospital.     Subjective:   He has good oral intake, reports no worsening dyspnea .    Assessment  & Plan :    Acute respiratory failure with hypoxia  Recurrent right pleural effusion . - Required chest tube initially, currently discontinued . - So far cytology has been negative -Pleural effusion felt to be secondary to volume overload  the setting of uncontrolled A-fib, and acute on chronic CHF with malnutrition/hypoalbuminemia.  Contributing to reaccumulation of pleural fluid, as well unclear of significantly elevated TSH (hypothyroidism contributing), as well patient with autoimmune workup last year at [facility] significant for positive ANA, speckled pattern, SSB antibody, so unclear if autoimmune process is contributing - Pleural effusion significant for exudate,  but no bacteria or malignant cells. - Management per PCCM, they will follow as an outpatient with repeat imaging to determine if need Pleurx catheter or as needed thoracentesis. - I have discussed with the daughter, she understand the importance to follow-up with rheumatology and endocrinology at [facility] regarding these labs  Paroxysmal A-fib and RVR - Post cardioversion 6/10. 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  Elevated troponin: Mildly elevated, trend flat and in non-ACS pattern, this was in setting of A-fib with RVR and AKI. Chest pain-free.  Cardiology on board.   AKI.  Nephrology consulted, diuretic dose being managed by CHF and nephrology team.  Currently close to euvolemic.  Renal function has improved.  Glaucoma: Continue home eyedrops.   BPH: Currently holding his home finasteride    Essential hypertension/currently hypotensive: Continue  blood pressure low, hold beta-blocker, midodrine  added, albumin , Prosource, as needed IV hydralazine , monitor.  TSH is extremely high started on Synthroid , stable random cortisol.   Hypoalbuminemia: Patient's Albumin  Level is now 2.6. CTM and Trend and repeat CMP in the AM, placed on Prosource.   Hypothyroidism.  History of autoimmune thyroiditis, TSH greater than 25 this was the day after admission, free T4 0.71, low-dose Synthroid  and monitor.  Now amiodarone  has been added hands outpatient should be monitored by endocrinologist.       Condition - Extremely Guarded  Family Communication  : Discussed with daughter at bedside  Code Status : Full code  Consults  : Pulmonary, cardiology, nephrology, cardiothoracic surgery  PUD Prophylaxis :     Procedures  :     Right heart catheterization on 03/23/2024 by CHF team. 1. Normal left and right heart filling pressures. 2. Borderline elevated PA pressure. 3. Preserved cardiac output.   Right-sided chest tube placement by pulmonary critical care on 03/18/2024, chest tube discontinued 03/22/2024 evening  TTE -    1. Left ventricular ejection fraction, by estimation, is 30 to 35%. The left ventricle has moderately decreased function. The left ventricle  demonstrates global hypokinesis. The left ventricular internal cavity size was mildly dilated. Left ventricular diastolic parameters are indeterminate.   2. Abnormal septal motion and septal flattening suggesting significant pulmonary HTN. Right ventricular systolic function is severely reduced. The  right ventricular size is moderately enlarged. Moderately increased right ventricular wall thickness. There is severely elevated pulmonary artery systolic pressure.   3. Left atrial size was moderately dilated.   4. Right atrial size was severely dilated.   5. The mitral valve is abnormal. Mild mitral valve regurgitation. No evidence of mitral stenosis.   6. Tricuspid valve regurgitation is moderate.    7. The aortic valve is tricuspid. Aortic valve regurgitation is not visualized. No aortic stenosis is present.   8. The inferior vena cava is dilated in size with <50% respiratory variability, suggesting right atrial pressure of 15 mmHg.       Disposition Plan  :  Status is: Inpatient   DVT Prophylaxis  :    Place TED hose Start: 03/26/24 0932 Place TED hose Start: 03/17/24 1010 SCDs Start: 03/16/24 1742 apixaban  (ELIQUIS ) tablet 5 mg   Lab Results  Component Value Date   PLT 132 (L) 03/26/2024    Diet :  Diet Order             Diet Heart Room service appropriate? Yes; Fluid consistency: Thin  Diet effective now                    Inpatient Medications  Scheduled Meds:  (feeding supplement) PROSource Plus  30 mL Oral BID AC   amiodarone   200 mg Oral Daily   apixaban   5 mg Oral BID   cholecalciferol   1,000 Units Oral Daily   dapagliflozin  propanediol  10 mg Oral Daily   docusate sodium   100 mg Oral BID   dorzolamide -timolol   1 drop Both Eyes BID   feeding supplement  237 mL Oral TID BM   furosemide   20 mg Oral Daily   levothyroxine   50 mcg Oral Q0600   mirtazapine  7.5 mg Oral QHS   multivitamin with minerals  1 tablet Oral Daily   Continuous Infusions:    PRN Meds:.acetaminophen  **OR** acetaminophen , albuterol , bisacodyl , hydrALAZINE , ondansetron  **OR** ondansetron  (ZOFRAN ) IV, oxyCODONE , polyethylene glycol  Antibiotics  :    Anti-infectives (From admission, onward)    None         Objective:   Vitals:   03/26/24 0811 03/26/24 1100 03/26/24 1209 03/26/24 1212  BP: (!) 120/53 120/63 136/64 (!) 148/78  Pulse: 60     Resp: 19     Temp: 98.3 F (36.8 C) 97.7 F (36.5 C)    TempSrc: Oral Oral    SpO2: 94%     Weight:      Height:        Wt Readings from Last 3 Encounters:  03/26/24 71.5 kg  03/16/24 75.6 kg  03/05/24 73 kg     Intake/Output Summary (Last 24 hours) at 03/26/2024 1302 Last data filed at 03/26/2024 1100 Gross per 24  hour  Intake --  Output 760 ml  Net -760 ml     Physical Exam   Awake Alert, Oriented X 3, frail, deconditioned Symmetrical Chest wall movement, diminished air entry in the right lung, at baseline Regular rate and rhythm, no rubs or gallops +ve B.Sounds, Abd Soft, No tenderness, No rebound - guarding or rigidity. No Cyanosis, trace edema   Data Review:    Recent Labs  Lab 03/22/24 0531 03/23/24 0626 03/23/24 1042 03/23/24 1043 03/24/24 0510 03/25/24 0552 03/26/24 0750  WBC 7.2 5.6  --   --  5.9 6.0 6.1  HGB 13.3 13.5 15.0 15.0 12.3* 11.9* 12.1*  HCT 42.8 44.0 44.0 44.0 39.0 38.6* 40.3  PLT 155 153  --   --  153 146* 132*  MCV 94.1 94.4  --   --  93.5 94.6 95.7  MCH 29.2 29.0  --   --  29.5 29.2 28.7  MCHC 31.1 30.7  --   --  31.5 30.8 30.0  RDW 14.4 14.2  --   --  14.2 14.2 14.2  LYMPHSABS 0.5* 0.6*  --   --  0.5* 0.4* 0.4*  MONOABS 0.9 0.9  --   --  0.9 1.0 1.1*  EOSABS 0.1 0.1  --   --  0.1 0.1 0.1  BASOSABS 0.0 0.0  --   --  0.0 0.0 0.0    Recent Labs  Lab 03/20/24 0614 03/21/24 0548 03/22/24 0531 03/23/24 0626 03/23/24 0857 03/23/24 1042 03/23/24 1043 03/24/24 0510 03/25/24 0552 03/26/24 0750  NA 137  --  136 137 136 137 138 138 137 137  K 4.0  --  4.4 4.5 3.9 3.9 3.9 4.1 4.1 4.4  CL 102  --  96* 94* 93*  --   --  94* 95* 95*  CO2 28  --  31 38* 37*  --   --  35* 37* 33*  ANIONGAP 7  --  9 5 6   --   --  9 5 9   GLUCOSE 101*  --  86 102* 100*  --   --  92 138* 125*  BUN 42*  --  37* 39* 39*  --   --  42* 40* 36*  CREATININE 1.32*  --  1.06 1.14 1.03  --   --  1.17 1.17 1.16  CRP 3.9* 4.1*  --   --   --   --   --   --   --   --   PROCALCITON <0.10 <0.10 <0.10  --   --   --   --   --   --   --   BNP 1,050.7* 1,089.6*  --   --   --   --   --   --   --   --   MG 2.1  --   --  2.2  --   --   --  2.2 2.2 2.2  PHOS 2.9 2.7  --   --   --   --   --   --   --   --   CALCIUM 8.7*  --  8.4* 8.8* 8.6*  --   --  8.5* 8.9 9.0      Recent Labs  Lab  03/20/24 0614 03/21/24 0548 03/22/24 0531 03/23/24 0626 03/23/24 0857 03/24/24 0510 03/25/24 0552 03/26/24 0750  CRP 3.9* 4.1*  --   --   --   --   --   --   PROCALCITON <0.10 <0.10 <0.10  --   --   --   --   --   BNP 1,050.7* 1,089.6*  --   --   --   --   --   --   MG 2.1  --   --  2.2  --  2.2 2.2 2.2  CALCIUM 8.7*  --  8.4* 8.8* 8.6* 8.5* 8.9 9.0     Micro Results No results found for this or any previous visit (from the past 240 hours).  Radiology Report MR CARDIAC VELOCITY FLOW MAP Result Date: 03/26/2024 CLINICAL DATA:  Cardiomyopathy of uncertain etiology, assess for amyloidosis EXAM: CARDIAC MRI TECHNIQUE: The patient was scanned on a 1.5 Tesla GE magnet. A dedicated cardiac coil was used. Functional imaging was done using Fiesta sequences. 2,3, and 4 chamber views were done to assess for RWMA's. Modified Simpson's rule using a short axis stack was used to calculate an ejection fraction on a dedicated work [employer]. The patient received 8 cc of Gadavist. After 10 minutes inversion recovery sequences were used to assess for infiltration and scar tissue. FINDINGS: Limited images of the lung fields show a partially loculated-appearing moderate right pleural effusion. There is a small left pleural effusion. Airspace disease at bilateral bases. Small circumferential pericardial effusion, no tamponade. Mildly dilated left ventricular size with normal wall thickness.  Global hypokinesis, LV EF 51%. Moderately dilated right ventricle, RV EF 47%. Severe biatrial enlargement. Trileaflet aortic valve. Trivial mitral regurgitation, regurgitant fraction 7%. Difficult delayed enhancement images. There is small area of mid-wall late gadolinium enhancement (LGE) at the basal-mid inferoseptal RV insertion site. Small area of <50% wall thickness subendocardial LGE at the basal inferolateral wall segment. MEASUREMENTS: MEASUREMENTS LVEDV 230 mL LVEDVi 119 mL/m2 LVSV 117 LVEF 51% RVEDV  279 mL RVEDVi 144 mL/m2 RVSV 131 mL RVEF 47% Aortic forward volume 109 mL Aortic regurgitant fraction 0% Global T1 1119, ECV 30% Global T2 58 (elevated) IMPRESSION: 1. Lung findings as above, patient had recent CT chest with complete visualization of lung fields. 2.  Small pericardial effusion without tamponade. 3.  Mildly dilated LV with LV EF 51%, global hypokinesis. 4.  Moderately dilated RV with RV EF 47% (mildly decreased). 5.  Severe biatrial enlargement. 6. Nonspecific inferior RV insertion site LGE, this is nonspecific and can be seen with pressure/volume overload. Small area of <50% wall thickness subendocardial LGE at the basal inferolateral wall, possible coronary disease pattern. 7.  Borderline elevated extracellular volume percentage at 30%. 8.  Elevated global T2, can be seen with inflammation. Study is not highly suggestive of cardiac amyloidosis. Dalton Mclean Electronically Signed   By: [name] M.D.   On: 03/26/2024 07:47   MR CARDIAC VELOCITY FLOW MAP Result Date: 03/26/2024 CLINICAL DATA:  Cardiomyopathy of uncertain etiology, assess for amyloidosis EXAM: CARDIAC MRI TECHNIQUE: The patient was scanned on a 1.5 Tesla GE magnet. A dedicated cardiac coil was used. Functional imaging was done using Fiesta sequences. 2,3, and 4 chamber views were done to assess for RWMA's. Modified Simpson's rule using a short axis stack was used to calculate an ejection fraction on a dedicated work [employer] officer, [employer]. The patient received 8 cc of Gadavist. After 10 minutes inversion recovery sequences were used to assess for infiltration and scar tissue. FINDINGS: Limited images of the lung fields show a partially loculated-appearing moderate right pleural effusion. There is a small left pleural effusion. Airspace disease at bilateral bases. Small circumferential pericardial effusion, no tamponade. Mildly dilated left ventricular size with normal wall thickness. Global hypokinesis, LV EF 51%.  Moderately dilated right ventricle, RV EF 47%. Severe biatrial enlargement. Trileaflet aortic valve. Trivial mitral regurgitation, regurgitant fraction 7%. Difficult delayed enhancement images. There is small area of mid-wall late gadolinium enhancement (LGE) at the basal-mid inferoseptal RV insertion site. Small area of <50% wall thickness subendocardial LGE at the basal inferolateral wall segment. MEASUREMENTS: MEASUREMENTS LVEDV 230 mL LVEDVi 119 mL/m2 LVSV 117 LVEF 51% RVEDV 279 mL RVEDVi 144 mL/m2 RVSV 131 mL RVEF 47% Aortic forward volume 109 mL Aortic regurgitant fraction 0% Global T1 1119, ECV 30% Global T2 58 (elevated) IMPRESSION: 1. Lung findings as above, patient had recent CT chest with complete visualization of lung fields. 2.  Small pericardial effusion without tamponade. 3.  Mildly dilated LV with LV EF 51%, global hypokinesis. 4.  Moderately dilated RV with RV EF 47% (mildly decreased). 5.  Severe biatrial enlargement. 6. Nonspecific inferior RV insertion site LGE, this is nonspecific and can be seen with pressure/volume overload. Small area of <50% wall thickness subendocardial LGE at the basal inferolateral wall, possible coronary disease pattern. 7.  Borderline elevated extracellular volume percentage at 30%. 8.  Elevated global T2, can be seen with inflammation. Study is not highly suggestive of cardiac amyloidosis. Dalton Mclean Electronically Signed   By: Dalton  Mclean M.D.  On: 03/26/2024 07:46   MR CARDIAC MORPHOLOGY W WO CONTRAST Result Date: 03/26/2024 CLINICAL DATA:  Cardiomyopathy of uncertain etiology, assess for amyloidosis EXAM: CARDIAC MRI TECHNIQUE: The patient was scanned on a 1.5 Tesla GE magnet. A dedicated cardiac coil was used. Functional imaging was done using Fiesta sequences. 2,3, and 4 chamber views were done to assess for RWMA's. Modified Simpson's rule using a short axis stack was used to calculate an ejection fraction on a dedicated work Chief Technology Officer. The patient received 8 cc of Gadavist. After 10 minutes inversion recovery sequences were used to assess for infiltration and scar tissue. FINDINGS: Limited images of the lung fields show a partially loculated-appearing moderate right pleural effusion. There is a small left pleural effusion. Airspace disease at bilateral bases. Small circumferential pericardial effusion, no tamponade. Mildly dilated left ventricular size with normal wall thickness. Global hypokinesis, LV EF 51%. Moderately dilated right ventricle, RV EF 47%. Severe biatrial enlargement. Trileaflet aortic valve. Trivial mitral regurgitation, regurgitant fraction 7%. Difficult delayed enhancement images. There is small area of mid-wall late gadolinium enhancement (LGE) at the basal-mid inferoseptal RV insertion site. Small area of <50% wall thickness subendocardial LGE at the basal inferolateral wall segment. MEASUREMENTS: MEASUREMENTS LVEDV 230 mL LVEDVi 119 mL/m2 LVSV 117 LVEF 51% RVEDV 279 mL RVEDVi 144 mL/m2 RVSV 131 mL RVEF 47% Aortic forward volume 109 mL Aortic regurgitant fraction 0% Global T1 1119, ECV 30% Global T2 58 (elevated) IMPRESSION: 1. Lung findings as above, patient had recent CT chest with complete visualization of lung fields. 2.  Small pericardial effusion without tamponade. 3.  Mildly dilated LV with LV EF 51%, global hypokinesis. 4.  Moderately dilated RV with RV EF 47% (mildly decreased). 5.  Severe biatrial enlargement. 6. Nonspecific inferior RV insertion site LGE, this is nonspecific and can be seen with pressure/volume overload. Small area of <50% wall thickness subendocardial LGE at the basal inferolateral wall, possible coronary disease pattern. 7.  Borderline elevated extracellular volume percentage at 30%. 8.  Elevated global T2, can be seen with inflammation. Study is not highly suggestive of cardiac amyloidosis. Dalton Mclean Electronically Signed   By: [name] M.D.   On: 03/26/2024 07:46   DG  Chest Port 1 View Result Date: 03/26/2024 CLINICAL DATA:  Shortness of breath. EXAM: PORTABLE CHEST 1 VIEW COMPARISON:  03/24/2024 FINDINGS: Right apical pneumothorax seen on the previous study has become less prominent in the interval with no discrete pleural line visible on the current exam. Diffuse interstitial opacity persists in a similar fashion with right base collapse/consolidation/scarring in right pleural effusion, similar to prior. The cardio pericardial silhouette is enlarged. Telemetry leads overlie the chest. IMPRESSION: 1. Interval decrease in prominence of right apical pneumothorax. 2. No other significant interval change. Electronically Signed   By: Donnal Fusi M.D.   On: 03/26/2024 06:18      Signature  -   Seena Dadds M.D on 03/26/2024 at 1:02 PM   -  To page go to [email or web address]

## 2024-03-26 NOTE — Telephone Encounter (Signed)
 Patient has appointment for June 26th.

## 2024-03-27 ENCOUNTER — Other Ambulatory Visit (HOSPITAL_COMMUNITY): Payer: Self-pay

## 2024-03-27 ENCOUNTER — Encounter: Payer: Self-pay | Admitting: Hematology and Oncology

## 2024-03-27 DIAGNOSIS — J90 Pleural effusion, not elsewhere classified: Secondary | ICD-10-CM | POA: Diagnosis not present

## 2024-03-27 LAB — CBC WITH DIFFERENTIAL/PLATELET
Abs Immature Granulocytes: 0.04 10*3/uL (ref 0.00–0.07)
Basophils Absolute: 0 10*3/uL (ref 0.0–0.1)
Basophils Relative: 0 %
Eosinophils Absolute: 0.2 10*3/uL (ref 0.0–0.5)
Eosinophils Relative: 3 %
HCT: 39.9 % (ref 39.0–52.0)
Hemoglobin: 12 g/dL — ABNORMAL LOW (ref 13.0–17.0)
Immature Granulocytes: 1 %
Lymphocytes Relative: 8 %
Lymphs Abs: 0.5 10*3/uL — ABNORMAL LOW (ref 0.7–4.0)
MCH: 29.4 pg (ref 26.0–34.0)
MCHC: 30.1 g/dL (ref 30.0–36.0)
MCV: 97.8 fL (ref 80.0–100.0)
Monocytes Absolute: 1.2 10*3/uL — ABNORMAL HIGH (ref 0.1–1.0)
Monocytes Relative: 18 %
Neutro Abs: 4.6 10*3/uL (ref 1.7–7.7)
Neutrophils Relative %: 70 %
Platelets: 126 10*3/uL — ABNORMAL LOW (ref 150–400)
RBC: 4.08 MIL/uL — ABNORMAL LOW (ref 4.22–5.81)
RDW: 14.4 % (ref 11.5–15.5)
WBC: 6.4 10*3/uL (ref 4.0–10.5)
nRBC: 0 % (ref 0.0–0.2)

## 2024-03-27 LAB — BASIC METABOLIC PANEL WITH GFR
Anion gap: 12 (ref 5–15)
BUN: 35 mg/dL — ABNORMAL HIGH (ref 8–23)
CO2: 28 mmol/L (ref 22–32)
Calcium: 9 mg/dL (ref 8.9–10.3)
Chloride: 98 mmol/L (ref 98–111)
Creatinine, Ser: 1.12 mg/dL (ref 0.61–1.24)
GFR, Estimated: >60 mL/min (ref >60)
Glucose, Bld: 86 mg/dL (ref 70–99)
Potassium: 4.7 mmol/L (ref 3.5–5.1)
Sodium: 138 mmol/L (ref 135–145)

## 2024-03-27 MED ORDER — PROSOURCE PLUS PO LIQD
30.0000 mL | Freq: Two times a day (BID) | ORAL | 0 refills | Status: AC
Start: 2024-03-27 — End: ?
  Filled 2024-03-27: qty 887, 15d supply, fill #0

## 2024-03-27 MED ORDER — ADULT MULTIVITAMIN W/MINERALS CH
1.0000 | ORAL_TABLET | Freq: Every day | ORAL | 0 refills | Status: AC
  Filled 2024-03-27: qty 30, 30d supply, fill #0

## 2024-03-27 MED ORDER — DOCUSATE SODIUM 100 MG PO CAPS: 100.0000 mg | ORAL_CAPSULE | Freq: Two times a day (BID) | ORAL | Status: AC | PRN

## 2024-03-27 MED ORDER — LEVOTHYROXINE SODIUM 50 MCG PO TABS
50.0000 ug | ORAL_TABLET | Freq: Every day | ORAL | 0 refills | Status: DC
  Filled 2024-03-27: qty 30, 30d supply, fill #0

## 2024-03-27 MED ORDER — ENSURE PLUS HIGH PROTEIN PO LIQD: 237.0000 mL | Freq: Three times a day (TID) | ORAL | Status: AC

## 2024-03-27 MED ORDER — MIRTAZAPINE 7.5 MG PO TABS
7.5000 mg | ORAL_TABLET | Freq: Every day | ORAL | 0 refills | Status: AC
  Filled 2024-03-27: qty 30, 30d supply, fill #0

## 2024-03-27 NOTE — Plan of Care (Signed)
  Problem: Clinical Measurements: Goal: Will remain free from infection Outcome: Progressing Goal: Respiratory complications will improve Outcome: Progressing   Problem: Coping: Goal: Level of anxiety will decrease Outcome: Progressing   Problem: Elimination: Goal: Will not experience complications related to bowel motility Outcome: Progressing Goal: Will not experience complications related to urinary retention Outcome: Progressing   Problem: Education: Goal: Ability to demonstrate management of disease process will improve Outcome: Progressing Goal: Ability to verbalize understanding of medication therapies will improve Outcome: Progressing   Problem: Education: Goal: Knowledge of disease or condition will improve Outcome: Progressing Goal: Understanding of medication regimen will improve Outcome: Progressing   Problem: Activity: Goal: Ability to tolerate increased activity will improve Outcome: Progressing   Problem: Health Behavior/Discharge Planning: Goal: Ability to manage health-related needs will improve Outcome: Not Progressing

## 2024-03-27 NOTE — Progress Notes (Addendum)
 Advanced Heart Failure Rounding Note  Cardiologist: Arnoldo Lapping, MD  Chief Complaint: PHTN Subjective:    6/9 RHC: RA 4, PAP 34/18 (25), PCWP 13, CO/CI (fick) 4.59/2.34, PVR 2.6w 6/10: DCCV>NSR 6/11 cMRI: LVEF 51%, not highly suggestive of cardiac amyloid.   Stable from HF standpoint for discharge.  Weight down 2 lbs. Cr stable.   Objective:    Weight Range: 71.5 kg Body mass index is 20.8 kg/m.   Vital Signs:   Temp:  [97.4 F (36.3 C)-97.9 F (36.6 C)] 97.9 F (36.6 C) (06/12 2100) Pulse Rate:  [47-56] 52 (06/13 0400) Resp:  [13-26] 15 (06/13 0400) BP: (110-169)/(56-83) 110/56 (06/13 0400) SpO2:  [86 %-100 %] 99 % (06/13 0400) Last BM Date : 03/25/24  Weight change: Filed Weights   03/22/24 1713 03/25/24 0500 03/26/24 0500  Weight: 73.2 kg 72.3 kg 71.5 kg   Intake/Output:  Intake/Output Summary (Last 24 hours) at 03/27/2024 0925 Last data filed at 03/27/2024 0604 Gross per 24 hour  Intake --  Output 1800 ml  Net -1800 ml    Physical Exam    General: Cachetic, frail appearing. No distress on RA Cardiac: JVP flat. S1 and S2 present. No murmurs or rub. Resp: Lung sounds coarse Extremities: Warm and dry.  No peripenial edema.  Neuro: Alert and oriented x3. Affect pleasant.   Telemetry   SB in 50s (personally reviewed)  EKG    No new EKG to review  Labs    CBC Recent Labs    03/26/24 0750 03/27/24 0651  WBC 6.1 6.4  NEUTROABS 4.5 4.6  HGB 12.1* 12.0*  HCT 40.3 39.9  MCV 95.7 97.8  PLT 132* 126*   Basic Metabolic Panel Recent Labs    84/69/62 0552 03/26/24 0750 03/27/24 0651  NA 137 137 138  K 4.1 4.4 4.7  CL 95* 95* 98  CO2 37* 33* 28  GLUCOSE 138* 125* 86  BUN 40* 36* 35*  CREATININE 1.17 1.16 1.12  CALCIUM 8.9 9.0 9.0  MG 2.2 2.2  --    BNP (last 3 results) Recent Labs    03/19/24 0427 03/20/24 0614 03/21/24 0548  BNP 1,086.3* 1,050.7* 1,089.6*   ProBNP (last 3 results) Recent Labs    03/16/24 1238  PROBNP  7,160.0*   Imaging   No results found.  Medications:    Scheduled Medications:  (feeding supplement) PROSource Plus  30 mL Oral BID AC   amiodarone   200 mg Oral Daily   apixaban   5 mg Oral BID   cholecalciferol   1,000 Units Oral Daily   dapagliflozin  propanediol  10 mg Oral Daily   docusate sodium   100 mg Oral BID   dorzolamide -timolol   1 drop Both Eyes BID   feeding supplement  237 mL Oral TID BM   furosemide   20 mg Oral Daily   levothyroxine   50 mcg Oral Q0600   mirtazapine   7.5 mg Oral QHS   multivitamin with minerals  1 tablet Oral Daily   Infusions:  PRN Medications: acetaminophen  **OR** acetaminophen , albuterol , bisacodyl , hydrALAZINE , ondansetron  **OR** ondansetron  (ZOFRAN ) IV, oxyCODONE , polyethylene glycol  Patient Profile   Samuel Porter is a 83 y.o. male with a PMH of squamous cell carcinoma treated with PD-1, HFrEF, afib, HTN. HF following for pHTN.   Assessment/Plan   1. 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  Echo this admission with EF 30-35%, moderate RV enlargement/severe RV dysfunction with  D-shaped septum, PASP 70 mmHg.  Etiology unclear. PD-1 blocker (cemiplimab ) can be associated with pulmonary hypertension, but rare. 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 Decreased LV systolic function may be due in part to septal shift/RV dysfunction and in part due to atrial fibrillation. Troponin minimally elevated with no trend.  RHC on 6/9 showed normal filling pressures, preserved cardiac output, and only borderline elevated PA pressure. CMR yesterday showed LVEF 51%, not highly suggestive of cardiac amyloid.  - Off Midodrine , BP stable.  - Continue Lasix  20 mg daily. Euvolemic.  - Continue Farxiga  10 mg daily.  - Holding ? blocker with bradycardia.  - Persistent ankle edema, UNNA boots in place. Will wear compression socks at home.  - Off digoxin  with bradycardia.  2.  Right  pleural effusion: Chronic/recurrent.  Multiple thoracenteses, fluid cytology has been negative.  Now with trapped lung. Do suspect some cardiac involvement, but his degree of LV dysfunction seems out of proportion and with predominatly RV failure and no significant MR his persistent effusion is not easily explained.  Right chest tube removed. RHC 6/9 showed normal filling pressures, do not think that CHF is currently driving his effusion. CXR with persistent hydropneumothorax on right, Pulm edema improving.  - Mgmt per pulmonology.  3. 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  Pleural fluid cytology has been negative. S/p immunotherapy (cemiplimab ).  4. Atrial fibrillation: Persistent.  DCCV in 5/25 but recurred. Successful DCCV to NSR 6/10. Remains in SB 50s on tele. asymptomatic.  - Continue amio 200 mg daily. Not ideal medication for him with lung disease but will continue for now. CTM HR.  - Continue eliquis  5 mg bid  5. Pulmonary: Right recurrent pleural effusion as above with trapped lung. 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   HF Team Medication Recommendations for Home: Amiodarone  200 mg daily Eliquis  5 mg bid Farxiga  10 mg daily Lasix  20 mg daily  Follow up scheduled.   Length of Stay: 41  [place] Lee, NP  03/27/2024, 9:25 AM  Advanced Heart Failure Team Pager [phone] (M-F; 7a - 5p)  Please contact [facility] for night-coverage after hours (5p -7a ) and weekends on [email or web address]  Patient seen with NP, I formulated the plan and agree with the above note.   Stable BP off midodrine . Stable creatinine.   General: NAD, frail Neck: JVP 8 cm, no thyromegaly or thyroid  nodule.  Lungs: Clear to auscultation bilaterally with normal respiratory effort. CV: Nondisplaced PMI.  Heart regular S1/S2, no S3/S4, no murmur.  No peripheral edema.   Abdomen: Soft, nontender, no hepatosplenomegaly, no distention.  Skin: Intact without lesions or rashes.   Neurologic: Alert and oriented x 3.  Psych: Normal affect. Extremities: No clubbing or cyanosis.  HEENT: Normal.   Myeloma panel with monoclonal IgG.  As above, cardiac MRI is not typical for cardiac amyloidosis.  He will need followup with hematology as an outpatient, will likely need bone marrow biopsy (would make sure to stain for amyloidosis).    Continue current Lasix  and Farxiga .   He remains in NSR on amiodarone , will continue for now though not ideal with lung disease.   [name] 03/27/2024

## 2024-03-27 NOTE — TOC Transition Note (Signed)
 Transition of Care [facility]) - Discharge Note   Patient Details  Name: Samuel Porter MRN: [id number] Date of Birth: 1940-11-02  [facility] CM/SW Contact:  Benjiman Bras, RN Phone Number: [phone] 03/27/2024, 9:01 AM   Clinical Narrative:     TOC CM spoke to pt and dtr at bedside. Offered choice for HH, provided medicare.gov list with ratings. Dtr choice [facility] unable to accept referral, they do not service [employer]. [facility] accepted referral for [facility] Update dtr, Evone Hoh.  Contacted Samuel Crock for oxygen and RW for dc.   Final next level of care: Home w Home Health Services Barriers to Discharge: No Barriers Identified   Patient Goals and CMS Choice Patient states their goals for this hospitalization and ongoing recovery are:: wants to go home CMS Medicare.gov Compare Post Acute Care list provided to:: Patient Represenative (must comment) Choice offered to / list presented to : Adult Children      Discharge Placement                       Discharge Plan and Services Additional resources added to the After Visit Summary for     Discharge Planning Services: CM Consult Post Acute Care Choice: Home Health          DME Arranged: Walker rolling, Oxygen DME Agency: [facility] Date DME Agency Contacted: 03/27/24 Time DME Agency Contacted: [phone] Representative spoke with at DME Agency: Marlou Sims HH Arranged: RN, PT, OT [facility] Agency: [facility] Date [facility] Agency Contacted: 03/26/24 Time HH Agency Contacted: [phone] Representative spoke with at [facility] Agency: [facility]  Social Drivers of Health (SDOH) Interventions SDOH Screenings   Food Insecurity: Patient Declined (03/17/2024)  Housing: Low Risk  (11/30/2022)  Transportation Needs: No Transportation Needs (02/04/2024)   Received from [facility]  Utilities: Low Risk  (02/04/2024)   Received from [facility]  Depression (PHQ2-9): Low Risk  (11/30/2022)  Tobacco Use: Medium  Risk (03/23/2024)     Readmission Risk Interventions     No data to display

## 2024-03-27 NOTE — Plan of Care (Signed)
Patient is ready to go home.

## 2024-03-27 NOTE — Progress Notes (Signed)
 Orthopedic Tech Progress Note Patient Details:  Samuel Porter Jun 29, 1941 [id number]  Ortho Devices Type of Ortho Device: Radio broadcast assistant Ortho Device/Splint Location: Bilateral Ortho Device/Splint Interventions: Ordered, Application, Adjustment   Post Interventions Patient Tolerated: Well Instructions Provided: Adjustment of device  Edmonia Gottron 03/27/2024, 10:55 AM

## 2024-03-27 NOTE — Discharge Summary (Signed)
 Physician Discharge Summary  Samuel Porter:[id number] DOB: Jun 20, 1941 DOA: 03/16/2024  PCP: Samuel Coho, MD  Admit date: 03/16/2024 Discharge date: 03/27/2024  Admitted From: (Home) Disposition:  (Home )  Recommendations for Outpatient Follow-up:  Follow up with PCP in 1-2 weeks Please obtain BMP/CBC in one week Please repeat chest x-ray in 1 week Please monitor TSH level and adjust Synthroid  dose as needed CHF team and [place] [facility] were arranged for outpatient follow-up 6.  Ambulatory referral has been sent to rheumatology and endocrinology   Home Health: (YES) Equipment/Devices: (oxygen 2L) Discharge Condition: (Stable) CODE STATUS: (FULL) Diet recommendation: Heart Healthy   Brief/Interim Summary: 83 y.o. male with medical history significant for but not limited to BPH, glaucoma, hypertension, vitamin D  deficiency, atrial fibrillation on anticoagulation with recent cardioversion about 2 weeks ago, history of recurrent pleural effusions, patient presents to ED secondary to complaints of shortness of breath, workup significant for pleural effusion, as well he was found to be in A-fib with RVR, required cardioversion, maintaining sinus rhythm, no clear etiology for his pleural effusion, but may be in the setting of acute on chronic systolic CHF, with uncontrolled heart rate contributing to heart failure, as well malnutrition and hypoalbuminemia thought to be contributing to her reaccumulation, will workup significant for hypothyroidism, and he had normal labs at [facility] last year concerning for an to immune disorder.     Acute respiratory failure with hypoxia  Recurrent right pleural effusion . - Required chest tube initially, currently discontinued . - So far cytology has been negative -Pleural effusion felt to be secondary to volume overload the setting of uncontrolled A-fib, and acute on chronic CHF with malnutrition/hypoalbuminemia.  Contributing to reaccumulation of pleural  fluid, as well has significantly elevated TSH (hypothyroidism contributing), as well patient with autoimmune workup last year at [facility] significant for positive ANA, speckled pattern, SSB antibody, so unclear if autoimmune process is contributing, discussed with the daughters, all of these issues can be contributing to recurrent pleural effusion, so I recommended to follow with rheumatology to establish if he is having autoimmune process, and to treat hypothyroidism to see if it helps, and he will have close follow-up with pulmonary and cardiology as an outpatient to address volume status, A-fib and recurrent effusion with pulmonary. - Pleural effusion significant for exudate, but no bacteria or malignant cells. - Management per PCCM, they will follow as an outpatient with repeat imaging to determine if need Pleurx catheter or as needed thoracentesis. - I have discussed with the daughter, she understand the importance to follow-up with rheumatology and endocrinology at [facility] regarding these labs   Paroxysmal A-fib and RVR - Post cardioversion 6/10. 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  Elevated troponin: Mildly elevated, trend flat and in non-ACS pattern, this was in setting of A-fib with RVR and AKI. Chest pain-free.  Cardiology on board.   AKI.  Nephrology consulted, diuretic dose being managed by CHF and nephrology team.  Currently close to euvolemic.  Renal function has improved.   Glaucoma: Continue home eyedrops.   BPH: Currently holding his home finasteride    Essential hypertension/currently hypotensive: Continue blood pressure low, hold beta-blocker, midodrine  added, albumin , Prosource, as needed IV hydralazine , monitor.  TSH is extremely high started on Synthroid , stable random cortisol.   Hypoalbuminemia: Patient's Albumin  Level is now 2.6. CTM and Trend and repeat CMP in the AM, placed on Prosource.   Hypothyroidism.  History of autoimmune thyroiditis, TSH greater than 25 this was the day after admission, free T4 0.71, low-dose Synthroid  and monitor.  Now amiodarone  has been added hands outpatient should be monitored by endocrinologist.      Discharge Diagnoses:  Principal Problem:   Recurrent right pleural effusion Active Problems:   Pleural effusion   Hypervolemia   Hypoalbuminemia   Acute on chronic combined systolic and diastolic heart failure (HCC)   Persistent atrial fibrillation (HCC)   ILD (interstitial lung disease) (HCC)   Encounter to discuss test results   Pulmonary hypertension, unspecified (HCC)   Acute right heart failure (HCC)   Protein-calorie malnutrition, severe    Discharge Instructions  Discharge Instructions     Amb referral to AFIB Clinic   Complete by: As directed    Ambulatory referral to Endocrinology   Complete by: As directed    Ambulatory referral to Rheumatology   Complete by: As directed    Diet - low sodium heart healthy   Complete by: As directed    Discharge instructions   Complete by: As directed    Follow with Primary MD Samuel Coho, MD in 7 days   Get CBC, CMP, 2 view Chest X ray checked  by  Primary MD next visit.    Activity: As tolerated with Full fall precautions use walker/cane & assistance as needed   Disposition Home    Diet: Heart Healthy  , with feeding assistance and aspiration precautions.  For Heart failure patients - Check your Weight same time everyday, if you gain over 2 pounds, or you develop in leg swelling, experience more shortness of breath or chest pain, call your Primary MD immediately. Follow Cardiac Low Salt Diet and 1.5 lit/day fluid restriction.   On your next visit with your primary care physician please Get Medicines reviewed and adjusted.   Please request your Prim.MD to go over all Hospital Tests and Procedure/Radiological results at the follow up, please get all Hospital records sent to your Prim MD by signing hospital release before you go home.   If you experience worsening of your admission symptoms, develop shortness of breath, life threatening emergency, suicidal or homicidal thoughts you must seek medical attention immediately by calling 911 or calling your MD immediately  if symptoms less severe.  You Must read complete instructions/literature along with all the possible adverse reactions/side effects for all the Medicines you take and that have been prescribed to you. Take any new Medicines after you have completely understood and accpet all the possible adverse reactions/side effects.   Do not drive, operating heavy machinery, perform activities at heights, swimming or participation in water  activities or provide baby sitting services if your were admitted for syncope or siezures until you have seen by Primary MD or a Neurologist and  advised to do so again.  Do not drive when taking Pain medications.    Do not take more than prescribed Pain, Sleep and Anxiety Medications  Special Instructions: If you have smoked or chewed Tobacco  in the last 2 yrs please stop smoking, stop any regular Alcohol  and or any Recreational drug  use.  Wear Seat belts while driving.   Please note  You were cared for by a hospitalist during your hospital stay. If you have any questions about your discharge medications or the care you received while you were in the hospital after you are discharged, you can call the unit and asked to speak with the hospitalist on call if the hospitalist that took care of you is not available. Once you are discharged, your primary care physician will handle any further medical issues. Please note that NO REFILLS for any discharge medications will be authorized once you are discharged, as it is imperative that you return to your primary care physician (or establish a relationship with a primary care physician if you do not have one) for your aftercare needs so that they can reassess your need for medications and monitor your lab values.   Increase activity slowly   Complete by: As directed    No wound care   Complete by: As directed       Allergies as of 03/27/2024   No Known Allergies      Medication List     STOP taking these medications    amLODipine  5 MG tablet Commonly known as: NORVASC    carvedilol  3.125 MG tablet Commonly known as: COREG    spironolactone  25 MG tablet Commonly known as: ALDACTONE    valsartan  40 MG tablet Commonly known as: Diovan    Viagra 25 MG tablet Generic drug: sildenafil       TAKE these medications    (feeding supplement) PROSource Plus liquid Take 30 mLs by mouth 2 (two) times daily before lunch and supper.   feeding supplement Liqd Take 237 mLs by mouth 3 (three) times daily between meals.   acetaminophen  325 MG tablet Commonly known as: TYLENOL  Take 650 mg by mouth daily as needed for moderate pain (pain score 4-6) or mild pain (pain score 1-3).   albuterol  108 (90 Base) MCG/ACT inhaler Commonly known as: VENTOLIN  HFA Inhale 2 puffs into the lungs every 4 (four) hours as needed for shortness of breath.   amiodarone  200 MG tablet Commonly  known as: PACERONE  Take 200 mg by mouth daily.   cholecalciferol  1000 units tablet Commonly known as: VITAMIN D  Take 1,000 Units by mouth daily.   dapagliflozin  propanediol 10 MG Tabs tablet Commonly known as: Farxiga  Take 1 tablet (10 mg total) by mouth daily before breakfast.   docusate sodium  100 MG capsule Commonly known as: COLACE Take 1 capsule (100 mg total) by mouth 2 (two) times daily as needed for mild constipation.   dorzolamide -timolol  2-0.5 % ophthalmic solution Commonly known as: COSOPT  Place 1 drop into both eyes 2 (two) times daily.   Eliquis  5 MG Tabs tablet Generic drug: apixaban  Take 5 mg by mouth 2 (two) times daily.   finasteride  5 MG tablet Commonly known as: PROSCAR  Take 5 mg by mouth daily.   furosemide  20 MG tablet Commonly known as: LASIX  Take 1 tablet (20 mg total) by mouth daily.   levothyroxine  50 MCG tablet Commonly known as: SYNTHROID  Take 1 tablet (50 mcg total) by mouth daily at 6 (six) AM. Start taking on: March 28, 2024   mirtazapine  7.5 MG tablet Commonly known as: REMERON  Take 1 tablet (7.5 mg total) by mouth at bedtime.   multivitamin with minerals Tabs tablet Take 1 tablet by mouth daily.   polyethylene glycol 17 g packet Commonly known as: MIRALAX  / GLYCOLAX  Take 17 g by mouth 2 (two) times daily as needed. What changed:  when to take this reasons to take this               Durable Medical Equipment  (From admission, onward)           Start     Ordered   03/27/24 0856  For home use only DME oxygen  Once       Comments: Please evaluate for POC  Question Answer Comment  Length of Need 6 Months   Mode or (Route) Nasal cannula   Liters per Minute 2   Frequency Continuous (stationary and portable oxygen unit needed)   Oxygen conserving device Yes   Oxygen delivery system Gas      03/27/24 0855   03/27/24 0855  For home use only DME Walker rolling  Once       Question Answer Comment  Walker: With 5 Inch  Wheels   Patient needs a walker to treat with the following condition Physical deconditioning   Patient needs a walker to treat with the following condition Heart failure (HCC)      03/27/24 0854            Follow-up Information     [facility] Follow up on 04/06/2024.   Specialty: Cardiology Why: Follow up in the Advanced Heart Failure Clinic 6/23 @ 8am Entrance C, free valet Please bring all medications with you Contact information: [address] [place]  [address] [phone]        Samuel Coho, MD Follow up.   Specialty: Family Medicine Why: hospital follow up appt, office will call with appt time Contact information: [address] [place] [address] [phone]         [facility] Follow up.   Specialty: Home Health Services Why: Home Health RN, Occupational Therapy and Physical Therapy-agency will call to arrange visits Contact information: [address] [place] [address] [phone]                No Known Allergies  Consultations: -Pulmonary, cardiology, nephrology, cardiothoracic surgery    Procedures/Studies: MR CARDIAC VELOCITY FLOW MAP Result Date: 03/26/2024 CLINICAL DATA:  Cardiomyopathy of uncertain etiology, assess for amyloidosis EXAM: CARDIAC MRI TECHNIQUE: The patient was scanned on a 1.5 Tesla GE magnet. A dedicated cardiac coil was used. Functional imaging was done using Fiesta sequences. 2,3, and 4 chamber views were done to assess for RWMA's. Modified Simpson's rule using a short axis stack was used to calculate an ejection fraction on a dedicated work [employer]. The patient received 8 cc of Gadavist . After 10 minutes inversion recovery sequences were used to assess for infiltration and scar tissue. FINDINGS: Limited images of the lung fields show a partially loculated-appearing moderate right pleural  effusion. There is a small left pleural effusion. Airspace disease at bilateral bases. Small circumferential pericardial effusion, no tamponade. Mildly dilated left ventricular size with normal wall thickness. Global hypokinesis, LV EF 51%. Moderately dilated right ventricle, RV EF 47%. Severe biatrial enlargement. Trileaflet aortic valve. Trivial mitral regurgitation, regurgitant fraction 7%. Difficult delayed enhancement images.  There is small area of mid-wall late gadolinium enhancement (LGE) at the basal-mid inferoseptal RV insertion site. Small area of <50% wall thickness subendocardial LGE at the basal inferolateral wall segment. MEASUREMENTS: MEASUREMENTS LVEDV 230 mL LVEDVi 119 mL/m2 LVSV 117 LVEF 51% RVEDV 279 mL RVEDVi 144 mL/m2 RVSV 131 mL RVEF 47% Aortic forward volume 109 mL Aortic regurgitant fraction 0% Global T1 1119, ECV 30% Global T2 58 (elevated) IMPRESSION: 1. Lung findings as above, patient had recent CT chest with complete visualization of lung fields. 2.  Small pericardial effusion without tamponade. 3.  Mildly dilated LV with LV EF 51%, global hypokinesis. 4.  Moderately dilated RV with RV EF 47% (mildly decreased). 5.  Severe biatrial enlargement. 6. Nonspecific inferior RV insertion site LGE, this is nonspecific and can be seen with pressure/volume overload. Small area of <50% wall thickness subendocardial LGE at the basal inferolateral wall, possible coronary disease pattern. 7.  Borderline elevated extracellular volume percentage at 30%. 8.  Elevated global T2, can be seen with inflammation. Study is not highly suggestive of cardiac amyloidosis. Dalton Mclean Electronically Signed   By: [name] M.D.   On: 03/26/2024 07:47   MR CARDIAC VELOCITY FLOW MAP Result Date: 03/26/2024 CLINICAL DATA:  Cardiomyopathy of uncertain etiology, assess for amyloidosis EXAM: CARDIAC MRI TECHNIQUE: The patient was scanned on a 1.5 Tesla GE magnet. A dedicated cardiac coil was used. Functional  imaging was done using Fiesta sequences. 2,3, and 4 chamber views were done to assess for RWMA's. Modified Simpson's rule using a short axis stack was used to calculate an ejection fraction on a dedicated work [employer], [employer]. The patient received 8 cc of Gadavist . After 10 minutes inversion recovery sequences were used to assess for infiltration and scar tissue. FINDINGS: Limited images of the lung fields show a partially loculated-appearing moderate right pleural effusion. There is a small left pleural effusion. Airspace disease at bilateral bases. Small circumferential pericardial effusion, no tamponade. Mildly dilated left ventricular size with normal wall thickness. Global hypokinesis, LV EF 51%. Moderately dilated right ventricle, RV EF 47%. Severe biatrial enlargement. Trileaflet aortic valve. Trivial mitral regurgitation, regurgitant fraction 7%. Difficult delayed enhancement images. There is small area of mid-wall late gadolinium enhancement (LGE) at the basal-mid inferoseptal RV insertion site. Small area of <50% wall thickness subendocardial LGE at the basal inferolateral wall segment. MEASUREMENTS: MEASUREMENTS LVEDV 230 mL LVEDVi 119 mL/m2 LVSV 117 LVEF 51% RVEDV 279 mL RVEDVi 144 mL/m2 RVSV 131 mL RVEF 47% Aortic forward volume 109 mL Aortic regurgitant fraction 0% Global T1 1119, ECV 30% Global T2 58 (elevated) IMPRESSION: 1. Lung findings as above, patient had recent CT chest with complete visualization of lung fields. 2.  Small pericardial effusion without tamponade. 3.  Mildly dilated LV with LV EF 51%, global hypokinesis. 4.  Moderately dilated RV with RV EF 47% (mildly decreased). 5.  Severe biatrial enlargement. 6. Nonspecific inferior RV insertion site LGE, this is nonspecific and can be seen with pressure/volume overload. Small area of <50% wall thickness subendocardial LGE at the basal inferolateral wall, possible coronary disease pattern. 7.  Borderline elevated extracellular  volume percentage at 30%. 8.  Elevated global T2, can be seen with inflammation. Study is not highly suggestive of cardiac amyloidosis. Dalton Mclean Electronically Signed   By: [name] M.D.   On: 03/26/2024 07:46   MR CARDIAC MORPHOLOGY W WO CONTRAST Result Date: 03/26/2024 CLINICAL DATA:  Cardiomyopathy of uncertain etiology, assess for amyloidosis EXAM: CARDIAC MRI TECHNIQUE:  The patient was scanned on a 1.5 Tesla GE magnet. A dedicated cardiac coil was used. Functional imaging was done using Fiesta sequences. 2,3, and 4 chamber views were done to assess for RWMA's. Modified Simpson's rule using a short axis stack was used to calculate an ejection fraction on a dedicated work [employer] officer, [employer]. The patient received 8 cc of Gadavist . After 10 minutes inversion recovery sequences were used to assess for infiltration and scar tissue. FINDINGS: Limited images of the lung fields show a partially loculated-appearing moderate right pleural effusion. There is a small left pleural effusion. Airspace disease at bilateral bases. Small circumferential pericardial effusion, no tamponade. Mildly dilated left ventricular size with normal wall thickness. Global hypokinesis, LV EF 51%. Moderately dilated right ventricle, RV EF 47%. Severe biatrial enlargement. Trileaflet aortic valve. Trivial mitral regurgitation, regurgitant fraction 7%. Difficult delayed enhancement images. There is small area of mid-wall late gadolinium enhancement (LGE) at the basal-mid inferoseptal RV insertion site. Small area of <50% wall thickness subendocardial LGE at the basal inferolateral wall segment. MEASUREMENTS: MEASUREMENTS LVEDV 230 mL LVEDVi 119 mL/m2 LVSV 117 LVEF 51% RVEDV 279 mL RVEDVi 144 mL/m2 RVSV 131 mL RVEF 47% Aortic forward volume 109 mL Aortic regurgitant fraction 0% Global T1 1119, ECV 30% Global T2 58 (elevated) IMPRESSION: 1. Lung findings as above, patient had recent CT chest with complete visualization of  lung fields. 2.  Small pericardial effusion without tamponade. 3.  Mildly dilated LV with LV EF 51%, global hypokinesis. 4.  Moderately dilated RV with RV EF 47% (mildly decreased). 5.  Severe biatrial enlargement. 6. Nonspecific inferior RV insertion site LGE, this is nonspecific and can be seen with pressure/volume overload. Small area of <50% wall thickness subendocardial LGE at the basal inferolateral wall, possible coronary disease pattern. 7.  Borderline elevated extracellular volume percentage at 30%. 8.  Elevated global T2, can be seen with inflammation. Study is not highly suggestive of cardiac amyloidosis. Dalton Mclean Electronically Signed   By: [name] M.D.   On: 03/26/2024 07:46   DG Chest Port 1 View Result Date: 03/26/2024 CLINICAL DATA:  Shortness of breath. EXAM: PORTABLE CHEST 1 VIEW COMPARISON:  03/24/2024 FINDINGS: Right apical pneumothorax seen on the previous study has become less prominent in the interval with no discrete pleural line visible on the current exam. Diffuse interstitial opacity persists in a similar fashion with right base collapse/consolidation/scarring in right pleural effusion, similar to prior. The cardio pericardial silhouette is enlarged. Telemetry leads overlie the chest. IMPRESSION: 1. Interval decrease in prominence of right apical pneumothorax. 2. No other significant interval change. Electronically Signed   By: Donnal Fusi M.D.   On: 03/26/2024 06:18   EP STUDY Result Date: 03/24/2024 See surgical note for result.  DG Swallowing Func-Speech Pathology Result Date: 03/24/2024 Table formatting from the original result was not included. Modified Barium Swallow Study Patient Details Name: ALICK LECOMTE MRN: [id number] Date of Birth: [birth year] Today's Date: 03/24/2024 HPI/PMH: HPI: Samuel Porter is an 83 yo male presenting to ED 6/2 with worsening shortness of breath x3 days. Found to have A-fib RVR, acute on chronic diastolic HF, recurrent R>L pleural  effusion, and hypoxic respiratory failure. CT Chest shows large, multiloculated R hydropneumothorax with chest tube in place as well as unchanged underlying pulmonary fibrosis but assessment limited by pleural effusions and atelectasis. MBS ordered per MD. PMH includes BPH, glaucoma, HTN, A-fib on anticoagulation with recent cardioversion x2 weeks PTA, history of SCC, unintentional weight loss Clinical  Impression: Clinical Impression: Pt presents with a functional oropharyngeal swallow. No penetration/aspiration occurred throughout the study and there was no significant residue. The 13 mm barium tablet was given with thin liquids and was [facility], without noted esophageal retention or retrograde backflow. Education completed with pt and his daughter. Recommend he continue his current diet without ongoing SLP f/u. Will sign off at this time. Factors that may increase risk of adverse event in presence of aspiration Roderick Civatte & Jessy Morocco 2021): Factors that may increase risk of adverse event in presence of aspiration Roderick Civatte & Jessy Morocco 2021): Poor general health and/or compromised immunity; Respiratory or GI disease; Frail or deconditioned Recommendations/Plan: Swallowing Evaluation Recommendations Swallowing Evaluation Recommendations Recommendations: PO diet PO Diet Recommendation: Regular; Thin liquids (Level 0) Liquid Administration via: Cup; Straw Medication Administration: Whole meds with liquid Supervision: Patient able to self-feed Swallowing strategies  : Minimize environmental distractions; Slow rate; Small bites/sips Postural changes: Position pt fully upright for meals Oral care recommendations: Oral care BID (2x/day) Treatment Plan Treatment Plan Treatment recommendations: No treatment recommended at this time Follow-up recommendations: No SLP follow up Functional status assessment: Patient has not had a recent decline in their functional status. Recommendations Recommendations for follow up therapy are one  component of a multi-disciplinary discharge planning process, led by the attending physician.  Recommendations may be updated based on patient status, additional functional criteria and insurance authorization. Assessment: Orofacial Exam: Orofacial Exam Oral Cavity: Oral Hygiene: WFL Oral Cavity - Dentition: Adequate natural dentition Orofacial Anatomy: WFL Oral Motor/Sensory Function: WFL Anatomy: Anatomy: Suspected cervical osteophytes Boluses Administered: Boluses Administered Boluses Administered: Thin liquids (Level 0); Mildly thick liquids (Level 2, nectar thick); Moderately thick liquids (Level 3, honey thick); Puree; Solid  Oral Impairment Domain: Oral Impairment Domain Lip Closure: No labial escape Tongue control during bolus hold: Cohesive bolus between tongue to palatal seal Bolus preparation/mastication: Timely and efficient chewing and mashing Bolus transport/lingual motion: Brisk tongue motion Oral residue: Complete oral clearance Location of oral residue : N/A Initiation of pharyngeal swallow : Posterior angle of the ramus  Pharyngeal Impairment Domain: Pharyngeal Impairment Domain Soft palate elevation: No bolus between soft palate (SP)/pharyngeal wall (PW) Laryngeal elevation: Complete superior movement of thyroid  cartilage with complete approximation of arytenoids to epiglottic petiole Anterior hyoid excursion: Complete anterior movement Epiglottic movement: Complete inversion Laryngeal vestibule closure: Complete, no air/contrast in laryngeal vestibule Pharyngeal stripping wave : Present - complete Pharyngeal contraction (A/P view only): N/A Pharyngoesophageal segment opening: Complete distension and complete duration, no obstruction of flow Tongue base retraction: No contrast between tongue base and posterior pharyngeal wall (PPW) Pharyngeal residue: Complete pharyngeal clearance Location of pharyngeal residue: N/A  Esophageal Impairment Domain: Esophageal Impairment Domain Esophageal clearance  upright position: Complete clearance, esophageal coating Pill: Pill Consistency administered: Thin liquids (Level 0) Thin liquids (Level 0): [facility] Penetration/Aspiration Scale Score: Penetration/Aspiration Scale Score 1.  Material does not enter airway: Thin liquids (Level 0); Mildly thick liquids (Level 2, nectar thick); Puree; Pill; Solid; Moderately thick liquids (Level 3, honey thick) Compensatory Strategies: Compensatory Strategies Compensatory strategies: No   General Information: Caregiver present: Yes (daughter)  Diet Prior to this Study: Regular; Thin liquids (Level 0)   Temperature : Normal   Respiratory Status: WFL   Supplemental O2: Nasal cannula   History of Recent Intubation: No  Behavior/Cognition: Alert; Cooperative; Pleasant mood Self-Feeding Abilities: Able to self-feed Baseline vocal quality/speech: Normal Volitional Cough: Able to elicit Volitional Swallow: Able to elicit Exam Limitations: No limitations Goal Planning: Prognosis for improved oropharyngeal  function: Good No data recorded No data recorded Patient/Family Stated Goal: want to get to root of illness Consulted and agree with results and recommendations: Patient; Family member/caregiver; Physician Pain: Pain Assessment Pain Assessment: Faces Faces Pain Scale: 0 Pain Intervention(s): Monitored during session End of Session: Start Time:SLP Start Time (ACUTE ONLY): 1002 Stop Time: SLP Stop Time (ACUTE ONLY): 1014 Time Calculation:SLP Time Calculation (min) (ACUTE ONLY): 12 min Charges: SLP Evaluations $ SLP Speech Visit: 1 Visit SLP Evaluations $MBS Swallow: 1 Procedure SLP visit diagnosis: SLP Visit Diagnosis: Dysphagia, unspecified (R13.10) Past Medical History: Past Medical History: Diagnosis Date  Arthritis   oa  BPH (benign prostatic hyperplasia)   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  Cataracts, bilateral   Glaucoma   Hypertension   Vitamin D  deficiency  Past Surgical History: Past Surgical History: Procedure Laterality  Date  CARDIOVERSION N/A 03/05/2024  Procedure: CARDIOVERSION;  Surgeon: Hazle Lites, MD;  Location: [facility] INVASIVE CV LAB;  Service: Cardiovascular;  Laterality: N/A;  CHEST TUBE INSERTION Right 03/18/2024  Procedure: CHEST TUBE INSERTION;  Surgeon: Guerry Leek, MD;  Location: [facility] ENDOSCOPY;  Service: Pulmonary;  Laterality: Right;  CHOLECYSTECTOMY    colonscopy  2017  IR THORACENTESIS ASP PLEURAL SPACE W/IMG GUIDE  03/03/2024  JOINT REPLACEMENT Right   hip   RIGHT HEART CATH N/A 03/23/2024  Procedure: RIGHT HEART CATH;  Surgeon: Darlis Eisenmenger, MD;  Location: [facility] INVASIVE CV LAB;  Service: Cardiovascular;  Laterality: N/A;  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  Procedure: LEFT TOTAL HIP ARTHROPLASTY ANTERIOR APPROACH;  Surgeon: Liliane Rei, MD;  Location: [facility] ORS;  Service: Orthopedics;  Laterality: Left;  VENTRAL HERNIA REPAIR N/A 07/12/2023  Procedure: HERNIA REPAIR VENTRAL;  Surgeon: Marny Sires Avon Boers, MD;  Location: [facility] OR;  Service: General;  Laterality: N/A; Amil Kale, M.A., CCC-SLP Speech Language Pathology, Acute Rehabilitation Services Secure Chat preferred [phone] 03/24/2024, 12:27 PM  DG Chest Port 1 View Result Date: 03/24/2024 CLINICAL DATA:  83 year old male with shortness of breath. Abnormal chest CT 3 days ago. EXAM: PORTABLE CHEST 1 VIEW COMPARISON:  Chest CT 03/21/2024 and earlier. FINDINGS: Portable AP semi upright view at 0607 hours. A right lung base pleural pigtail catheter has been removed since the recent CT. Right lung pneumothorax with irregular pleural edge is visible in the apex has not significantly changed since 03/20/2024. And otherwise the lung volumes and bilateral ventilation have not significantly changed from the recent CT. Stable cardiac size and mediastinal contours. Visualized tracheal air column is within normal limits. Stable visualized osseous structures.  Negative visible bowel gas. IMPRESSION: Right pleural pigtail catheter removal, but otherwise no  change in bilateral lung abnormality and ventilation from the CT on 03/21/2024 (please see that report). Unresolved right side pneumothorax has not significantly changed since 03/20/2024. Electronically Signed   By: Marlise Simpers M.D.   On: 03/24/2024 06:57   VAS US  LOWER EXTREMITY VENOUS (DVT) Result Date: 03/23/2024  Lower Venous DVT Study Patient Name:  DINK CREPS  Date of Exam:   03/23/2024 Medical Rec #: [id number]        Accession #:    [id number] Date of Birth: April 17, 1941         Patient Gender: M Patient Age:   38 years Exam Location:  [facility] Procedure:      VAS US  LOWER EXTREMITY VENOUS (DVT) Referring Phys: Felton Hough --------------------------------------------------------------------------------  Indications: Edema.  Risk Factors: Cancer. Comparison Study: No prior studies. Performing Technologist:  Lerry Ransom RVT  Examination Guidelines: A complete evaluation includes B-mode imaging, spectral Doppler, color Doppler, and power Doppler as needed of all accessible portions of each vessel. Bilateral testing is considered an integral part of a complete examination. Limited examinations for reoccurring indications may be performed as noted. The reflux portion of the exam is performed with the patient in reverse Trendelenburg.  +---------+---------------+---------+-----------+----------+--------------+ RIGHT    CompressibilityPhasicitySpontaneityPropertiesThrombus Aging +---------+---------------+---------+-----------+----------+--------------+ CFV      Full           Yes      Yes                                 +---------+---------------+---------+-----------+----------+--------------+ SFJ      Full                                                        +---------+---------------+---------+-----------+----------+--------------+ FV Prox  Full                                                         +---------+---------------+---------+-----------+----------+--------------+ FV Mid   Full                                                        +---------+---------------+---------+-----------+----------+--------------+ FV DistalFull                                                        +---------+---------------+---------+-----------+----------+--------------+ PFV      Full                                                        +---------+---------------+---------+-----------+----------+--------------+ POP      Full           Yes      Yes                                 +---------+---------------+---------+-----------+----------+--------------+ PTV      Full                                                        +---------+---------------+---------+-----------+----------+--------------+ PERO     Full                                                        +---------+---------------+---------+-----------+----------+--------------+   +---------+---------------+---------+-----------+----------+--------------+  LEFT     CompressibilityPhasicitySpontaneityPropertiesThrombus Aging +---------+---------------+---------+-----------+----------+--------------+ CFV      Full           Yes      Yes                                 +---------+---------------+---------+-----------+----------+--------------+ SFJ      Full                                                        +---------+---------------+---------+-----------+----------+--------------+ FV Prox  Full                                                        +---------+---------------+---------+-----------+----------+--------------+ FV Mid   Full                                                        +---------+---------------+---------+-----------+----------+--------------+ FV DistalFull                                                         +---------+---------------+---------+-----------+----------+--------------+ PFV      Full                                                        +---------+---------------+---------+-----------+----------+--------------+ POP      Full           Yes      Yes                                 +---------+---------------+---------+-----------+----------+--------------+ PTV      Full                                                        +---------+---------------+---------+-----------+----------+--------------+ PERO     Full                                                        +---------+---------------+---------+-----------+----------+--------------+     Summary: RIGHT: - There is no evidence of deep vein thrombosis in the lower extremity.  - No cystic structure found in the popliteal fossa.  LEFT: - There is no evidence of deep vein thrombosis in the lower extremity.  - No  cystic structure found in the popliteal fossa.  *See table(s) above for measurements and observations. Electronically signed by Irvin Mantel on 03/23/2024 at 4:40:08 PM.    Final    CARDIAC CATHETERIZATION Result Date: 03/23/2024 1. Normal left and right heart filling pressures. 2. Borderline elevated PA pressure. 3. Preserved cardiac output. We can stop IV Lasix , start po Lasix  tomorrow.   CT Chest High Resolution Result Date: 03/21/2024 CLINICAL DATA:  Interstitial lung disease, pleural effusion, trapped lung, query pleural metastases * Tracking Code: BO * EXAM: CT CHEST WITHOUT CONTRAST TECHNIQUE: Multidetector CT imaging of the chest was performed following the standard protocol without intravenous contrast. High resolution imaging of the lungs, as well as inspiratory and expiratory imaging, was performed. RADIATION DOSE REDUCTION: This exam was performed according to the departmental dose-optimization program which includes automated exposure control, adjustment of the mA and/or kV according to patient size and/or  use of iterative reconstruction technique. COMPARISON:  03/04/2024 FINDINGS: Cardiovascular: Aortic atherosclerosis. Cardiomegaly. Scattered left coronary artery calcifications. Enlargement of the main pulmonary artery measuring up to 3.5 cm in caliber. Similar small pericardial effusion. Mediastinum/Nodes: Unchanged prominent mediastinal and hilar lymph nodes. Thyroid  gland, trachea, and esophagus demonstrate no significant findings. Lungs/Pleura: Large, multiloculated right hydropneumothorax with a pigtail chest tube about the right lung base and a small pneumothorax component. 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 No significant air trapping on expiratory phase imaging. Small left pleural effusion, new compared to prior examination. Upper Abdomen: No acute abnormality. Musculoskeletal: No chest wall abnormality. No acute osseous findings. IMPRESSION: 1. Large, multiloculated right hydropneumothorax with a pigtail chest tube about the right lung base and a small pneumothorax component. 2. 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 3. Small left pleural effusion, new compared to prior examination. 4. Unchanged prominent mediastinal and hilar lymph nodes, likely reactive. 5.  Cardiomegaly and coronary artery disease. 6. Enlargement of the main pulmonary artery, as can be seen in pulmonary hypertension. Aortic Atherosclerosis (ICD10-I70.0). Electronically Signed   By: Fredricka Jenny M.D.   On: 03/21/2024 14:12   ECHOCARDIOGRAM LIMITED Result Date: 03/20/2024    ECHOCARDIOGRAM LIMITED REPORT   Patient Name:   PASCUAL MANTEL Date of Exam: 03/20/2024 Medical Rec #:  [id number]       Height:       73.0 in Accession #:    [id number]      Weight:       166.2 lb Date of Birth:  Mar 11, 1941        BSA:          1.989 m Patient Age:    82 years        BP:           129/77 mmHg Patient Gender: M               HR:           122 bpm. Exam Location:  Inpatient Procedure: 2D Echo, Cardiac Doppler and Color Doppler (Both  Spectral and Color            Flow Doppler were utilized during procedure). Indications:    CHF  History:        Patient has prior history of Echocardiogram examinations, most                 recent 12/29/2023. Pleural effusion.  Sonographer:    Jeralene Mom Referring Phys: [id number] BRIDGETTE CHRISTOPHER IMPRESSIONS  1. Left ventricular ejection fraction, by estimation, is 30 to 35%. The left ventricle has moderately decreased function. The left ventricle demonstrates global hypokinesis. The left ventricular internal cavity size was mildly dilated. Left ventricular diastolic parameters are indeterminate.  2. Abnormal septal motion and septal flattening suggesting significant pulmnary HTN. Right ventricular systolic function is severely reduced. The right ventricular size is moderately enlarged. Moderately increased right ventricular wall thickness. There  is severely elevated pulmonary artery systolic pressure.  3. Left atrial size was moderately dilated.  4. Right atrial size was severely dilated.  5. The mitral valve is abnormal. Mild mitral valve regurgitation. No evidence of mitral stenosis.  6. Tricuspid valve regurgitation is moderate.  7. The aortic valve is tricuspid. Aortic  valve regurgitation is not visualized. No aortic stenosis is present.  8. The inferior vena cava is dilated in size with <50% respiratory variability, suggesting right atrial pressure of 15 mmHg. FINDINGS  Left Ventricle: Left ventricular ejection fraction, by estimation, is 30 to 35%. The left ventricle has moderately decreased function. The left ventricle demonstrates global hypokinesis. Strain was performed and the global longitudinal strain is indeterminate. The left ventricular internal cavity size was mildly dilated. There is no left ventricular hypertrophy. Left ventricular diastolic parameters are indeterminate. Right Ventricle: Abnormal septal motion and septal flattening suggesting significant pulmnary HTN. The right ventricular size is moderately enlarged. Moderately increased right ventricular wall thickness. Right ventricular systolic function is severely reduced. There is severely elevated pulmonary artery systolic pressure. The tricuspid regurgitant velocity is 3.73 m/s, and with an assumed right atrial pressure of 15 mmHg, the estimated right ventricular systolic pressure is 70.7 mmHg. Left Atrium: Left atrial size was moderately dilated. Right Atrium: Right atrial size was severely dilated. Pericardium: Trivial pericardial effusion is present. The pericardial effusion is posterior to the left ventricle. Mitral Valve: The mitral valve is abnormal. There is mild thickening of the mitral valve leaflet(s). Mild mitral valve regurgitation. No evidence of mitral valve stenosis. Tricuspid Valve: The tricuspid valve is normal in structure. Tricuspid valve regurgitation is moderate . No evidence of tricuspid stenosis. Aortic Valve: The aortic valve is tricuspid. Aortic valve regurgitation is not visualized. No aortic stenosis is present. Aortic valve mean gradient measures 2.0 mmHg. Aortic valve peak gradient measures 4.3 mmHg. Aortic valve area, by VTI measures 2.19 cm. Pulmonic Valve: The pulmonic valve  was normal in structure. Pulmonic valve regurgitation is not visualized. No evidence of pulmonic stenosis. Aorta: The aortic root is normal in size and structure. Venous: The inferior vena cava is dilated in size with less than 50% respiratory variability, suggesting right atrial pressure of 15 mmHg. IAS/Shunts: No atrial level shunt detected by color flow Doppler. Additional Comments: 3D was performed not requiring image post processing on an independent workstation and was indeterminate.  LEFT VENTRICLE PLAX 2D LVIDd:         4.40 cm   Diastology LVIDs:         3.70 cm   LV e' medial:    3.48 cm/s LV PW:  0.90 cm   LV E/e' medial:  27.7 LV IVS:        0.90 cm   LV e' lateral:   10.60 cm/s LVOT diam:     2.10 cm   LV E/e' lateral: 9.1 LV SV:         37 LV SV Index:   18 LVOT Area:     3.46 cm  RIGHT VENTRICLE RV Basal diam:  4.80 cm RV Mid diam:    4.00 cm RV S prime:     9.03 cm/s TAPSE (M-mode): 0.9 cm LEFT ATRIUM             Index        RIGHT ATRIUM           Index LA diam:        4.40 cm 2.21 cm/m   RA Area:     34.70 cm LA Vol (A2C):   93.5 ml 47.01 ml/m  RA Volume:   134.00 ml 67.37 ml/m LA Vol (A4C):   58.9 ml 29.61 ml/m LA Biplane Vol: 75.1 ml 37.76 ml/m  AORTIC VALVE AV Area (Vmax):    2.47 cm AV Area (Vmean):   2.32 cm AV Area (VTI):     2.19 cm AV Vmax:           104.00 cm/s AV Vmean:          68.400 cm/s AV VTI:            0.168 m AV Peak Grad:      4.3 mmHg AV Mean Grad:      2.0 mmHg LVOT Vmax:         74.10 cm/s LVOT Vmean:        45.800 cm/s LVOT VTI:          0.106 m LVOT/AV VTI ratio: 0.63  AORTA Ao Asc diam: 3.60 cm MITRAL VALVE               TRICUSPID VALVE MV Area (PHT): 5.54 cm    TR Peak grad:   55.7 mmHg MV Decel Time: 137 msec    TR Vmax:        373.00 cm/s MR Peak grad: 34.3 mmHg MR Vmax:      293.00 cm/s  SHUNTS MV E velocity: 96.40 cm/s  Systemic VTI:  0.11 m MV A velocity: 30.00 cm/s  Systemic Diam: 2.10 cm MV E/A ratio:  3.21 Janelle Mediate MD Electronically signed by  Janelle Mediate MD Signature Date/Time: 03/20/2024/3:59:37 PM    Final    DG CHEST PORT 1 VIEW Result Date: 03/20/2024 CLINICAL DATA:  Chest tube in place. EXAM: PORTABLE CHEST 1 VIEW COMPARISON:  March 19, 2024. FINDINGS: Stable cardiomediastinal silhouette. Mild central pulmonary vascular congestion is noted with possible bilateral pulmonary edema. Stable right-sided chest tube with stable small right hydropneumothorax. Bony thorax is unremarkable. IMPRESSION: Stable right-sided chest tube with stable small right hydropneumothorax. Mild central pulmonary vascular congestion with possible bilateral pulmonary edema. Electronically Signed   By: Rosalene Colon M.D.   On: 03/20/2024 08:22   DG CHEST PORT 1 VIEW Result Date: 03/19/2024 CLINICAL DATA:  [id number] Chest tube in place [id number]. EXAM: PORTABLE CHEST 1 VIEW COMPARISON:  03/19/2024, 6:31 a.m. FINDINGS: There is small residual right pneumothorax, decreased since the recent prior study, status post presumed pleural drainage catheter exchange. Redemonstration of chronic interstitial and alveolar opacities throughout bilateral lungs without significant interval change. No left pneumothorax. There is blunting of  bilateral lateral costophrenic angles, concerning for underlying bilateral trace pleural effusions. Stable cardio-mediastinal silhouette. No acute osseous abnormalities. The soft tissues are within normal limits. IMPRESSION: 1. Small residual right pneumothorax, decreased since the recent prior study, status post presumed pleural drainage catheter exchange. 2. Redemonstration of chronic interstitial and alveolar opacities throughout bilateral lungs without significant interval change. Electronically Signed   By: Beula Brunswick M.D.   On: 03/19/2024 11:36   DG Chest Port 1 View Result Date: 03/19/2024 CLINICAL DATA:  Shortness of breath EXAM: PORTABLE CHEST 1 VIEW COMPARISON:  Chest x-ray performed March 18, 2024 FINDINGS: A right basilar pneumothorax is present  which extends along the periphery into the right apex. The basilar component has increased. The right basilar pigtail catheter is possibly kinked. Interstitial airspace opacities are present in both lungs, similar. Heart remains enlarged. IMPRESSION: 1. The right basilar pigtail catheter is possibly kinked. 2. Modest worsening of right basilar component of pneumothorax. These results will be called to the ordering clinician or representative by the Radiologist Assistant, and communication documented in the PACS or [employer]. Electronically Signed   By: Reagan Camera M.D.   On: 03/19/2024 06:57   DG Chest Port 1 View Result Date: 03/18/2024 CLINICAL DATA:  Right chest tube placement for a large right pleural effusion. EXAM: PORTABLE CHEST 1 VIEW COMPARISON:  03/17/2024 FINDINGS: Interval pigtail pleural catheter at the right lung base with resolution of the previously demonstrated right pleural effusion. This has been replaced with an approximately 35% right pneumothorax without mediastinal shift. The cardiac silhouette is borderline enlarged. Patchy density is again demonstrated at the left lung base there is some patchy and linear density in the partially re-expanded right lung. Stable mild chronic interstitial prominence. Thoracic spine degenerative changes. IMPRESSION: 1. Approximately 35% right pneumothorax without mediastinal shift. 2. Interval pigtail pleural catheter placement at the right lung base with resolution of the previously demonstrated right pleural effusion. 3. Patchy and linear density in the partially re-expanded right lung, likely due to atelectasis with possible pneumonia/re-expansion pulmonary edema. 4. Stable patchy density at the left lung base suspicious for pneumonia. These results will be called to the ordering clinician or representative by the Radiologist Assistant, and communication documented in the PACS or [employer]. Electronically Signed   By: Catherin Closs M.D.   On:  03/18/2024 15:01   US  RENAL Result Date: 03/17/2024 CLINICAL DATA:  Acute kidney injury EXAM: RENAL / URINARY TRACT ULTRASOUND COMPLETE COMPARISON:  CT 07/11/2023 FINDINGS: Right Kidney: Renal measurements: 11.1 x 3.9 x 5.3 cm = volume: 120 mL. Echogenicity within normal limits. No mass or hydronephrosis visualized. Left Kidney: Renal measurements: 12.4 x 5.2 x 5.3 cm = volume: 179 mL. Echogenicity within normal limits. No mass or hydronephrosis visualized. Bladder: Soft tissue protruding along the inferior bladder from the region of the prostate. Is difficult to determine if this is related to the prostate or bladder wall. Other: Prostate enlargement. IMPRESSION: No hydronephrosis. Prostate enlargement. Soft tissue nodular area projecting along the inferior bladder measuring up to 2.1 cm, difficult to determine if this is related to the prostate or bladder wall. When compared to prior CT, I favor this is related to enlarged prostate. This could be further evaluated with CT with contrast with delayed images through the bladder if felt clinically indicated. Electronically Signed   By: Janeece Mechanic M.D.   On: 03/17/2024 14:37   DG Chest 2 View Result Date: 03/17/2024 CLINICAL DATA:  Dyspnea EXAM: CHEST - 2 VIEW  COMPARISON:  March 16, 2024 FINDINGS: Persistent large right pleural effusion with right lower lobe atelectasis Persistent bilateral pulmonary infiltrates that could correlate with congestive changes Heart normal size IMPRESSION: No change compared with prior Electronically Signed   By: Fredrich Jefferson M.D.   On: 03/17/2024 08:39   DG Chest 1 View Result Date: 03/16/2024 CLINICAL DATA:  History of right pleural effusion EXAM: CHEST  1 VIEW COMPARISON:  Chest radiograph dated 03/03/2024 FINDINGS: Dense right lower lung opacity. Patchy left lower lung opacity. Increased large right pleural effusion. No pneumothorax. Right heart border is obscured. No acute osseous abnormality. Right axillary surgical clips.  IMPRESSION: 1. Increased large right pleural effusion with dense right lower lung opacity, likely atelectasis. 2. Patchy left lower lung opacity, likely atelectasis. Electronically Signed   By: Limin  Xu M.D.   On: 03/16/2024 11:30   CT CHEST HIGH RESOLUTION Result Date: 03/12/2024 CLINICAL DATA:  Interstitial lung disease, pleural effusion. EXAM: CT CHEST WITHOUT CONTRAST TECHNIQUE: Multidetector CT imaging of the chest was performed following the standard protocol without intravenous contrast. High resolution imaging of the lungs, as well as inspiratory and expiratory imaging, was performed. RADIATION DOSE REDUCTION: This exam was performed according to the departmental dose-optimization program which includes automated exposure control, adjustment of the mA and/or kV according to patient size and/or use of iterative reconstruction technique. COMPARISON:  02/04/2024 and PET 11/27/2022. FINDINGS: Cardiovascular: Atherosclerotic calcification of the aorta, aortic valve and coronary arteries. Enlarged pulmonic trunk and heart. No pericardial effusion. Mediastinum/Nodes: Thoracic inlet lymph nodes are not enlarged by CT size criteria. Mediastinal lymph nodes measure up to 11 mm in the low right paratracheal station and are likely reactive in etiology. Hilar regions are difficult to definitively evaluate without IV contrast. No axillary adenopathy. Esophagus is grossly unremarkable. Lungs/Pleura: Large partially loculated right pleural effusion with collapse/consolidation in the right middle and right lower lobes, slightly improved from 02/04/2024. Patchy coarsened ground-glass, interlobular and interlobular septal thickening and traction bronchiectasis. Calcified granulomas. Trace left pleural effusion. Airway is unremarkable. No air trapping. Upper Abdomen: Visualized portions of the liver, adrenal glands, left kidney, spleen, pancreas, stomach and bowel are grossly unremarkable. No upper abdominal adenopathy.  Musculoskeletal: Degenerative changes in the spine. Flowing anterior osteophytosis in the thoracic spine. IMPRESSION: 1. Large partially loculated right pleural effusion with collapse/consolidation in the right middle and right lower lobes, slightly improved from 02/04/2024. 2. Patchy coarsened pulmonary parenchymal ground-glass with interlobular and intralobular septal thickening, similar to 11/27/2022. 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 In the absence of air trapping, fibrotic hypersensitivity pneumonitis is considered less likely. Findings are suggestive of an alternative diagnosis (not UIP) per consensus guidelines: Diagnosis of Idiopathic Pulmonary Fibrosis: An Official ATS/ERS/JRS/ALAT Clinical Practice Guideline. Am Annie Barton Crit Care Med Vol 198, Iss 5, [phone], Jun 15 2017. 3. Trace left pleural effusion. 4. Aortic atherosclerosis (ICD10-I70.0). Coronary artery calcification. 5. Enlarged pulmonic trunk, indicative of pulmonary arterial hypertension. Electronically Signed   By: Shearon Denis M.D.   On: 03/12/2024 15:36   EP STUDY Result Date: 03/05/2024 See surgical note for result.  IR THORACENTESIS ASP PLEURAL SPACE W/IMG GUIDE Result Date: 03/03/2024 INDICATION: 83 year old male. History of skin cancer with recurrent right-sided pleural effusion. Request is for therapeutic and diagnostic right sided thoracentesis EXAM: ULTRASOUND GUIDED RIGHT-SIDED THERAPEUTIC AND DIAGNOSTIC THORACENTESIS MEDICATIONS: Lidocaine  1% 10 mL COMPLICATIONS: None immediate. PROCEDURE: An ultrasound guided thoracentesis was thoroughly discussed with the patient and questions answered. The benefits,  risks, alternatives and complications were also discussed. The patient understands and wishes to proceed with the procedure. Written consent was obtained. Ultrasound was performed to localize and mark an adequate pocket of fluid in the right chest. The area was  then prepped and draped in the normal sterile fashion. 1% Lidocaine  was used for local anesthesia. Under ultrasound guidance a 6 Fr Skater-Centesis catheter was introduced. Thoracentesis was performed. The catheter was removed and a dressing applied. FINDINGS: A total of approximately 1.3 L of serosanguineous fluid was removed. Samples were sent to the laboratory as requested by the clinical team. IMPRESSION: Successful ultrasound guided therapeutic and diagnostic right-sided thoracentesis yielding 1.3 L of serosanguineous of pleural fluid. Performed by Reagan Camera NP Electronically Signed   By: Elene Griffes M.D.   On: 03/03/2024 11:03   DG Chest 1 View Result Date: 03/03/2024 CLINICAL DATA:  Status post right thoracentesis.  1.3 L removed. EXAM: CHEST  1 VIEW COMPARISON:  Chest radiograph 02/27/2024 FINDINGS: Pleural fluid along the lateral aspect of the right hemithorax has resolved or decreased. There continues to be opacification at the right lung base that could represent residual pleural fluid and/or atelectasis/consolidation. Cardiac silhouette appears to be enlarged but stable. Mildly prominent interstitial lung markings could represent mild edema. Trachea is midline. Negative for a pneumothorax. IMPRESSION: 1. Decreased pleural fluid on the right. Negative for a pneumothorax. 2. Persistent opacification at the right lung base. 3. Cardiomegaly and possible mild edema. Electronically Signed   By: Elene Griffes M.D.   On: 03/03/2024 11:01   DG Chest 2 View Result Date: 02/27/2024 CLINICAL DATA:  Short of breath. EXAM: CHEST - 2 VIEW COMPARISON:  07/11/2023.  CT, 02/08/2004. FINDINGS: Cardiac silhouette is normal in size. No convincing mediastinal or left hilar masses. Right hilum is mostly obscured by contiguous opacity. Large right pleural effusion. Associated dependent right lung opacity is suspected to be atelectasis. Left lung demonstrates prominent vascular markings but is otherwise clear. No left  pleural effusion. No pneumothorax. Skeletal structures are intact. IMPRESSION: 1. Large right pleural effusion with associated dependent right lung opacity, consistent with atelectasis. A central obstructing mass or lesion on the right is not excluded. Recommend follow-up chest CT with contrast for further assessment. Electronically Signed   By: Amanda Jungling M.D.   On: 02/27/2024 13:05   (Echo, Carotid, EGD, Colonoscopy, ERCP)    Subjective:  No significant events overnight, he reports good night sleep, he has good appetite Discharge Exam: Vitals:   03/27/24 0000 03/27/24 0400  BP: 122/65 (!) 110/56  Pulse:  (!) 52  Resp:  15  Temp:    SpO2:  99%   Vitals:   03/26/24 2100 03/26/24 2200 03/27/24 0000 03/27/24 0400  BP:   122/65 (!) 110/56  Pulse:  (!) 55  (!) 52  Resp:  18  15  Temp: 97.9 F (36.6 C)     TempSrc: Oral     SpO2:  100%  99%  Weight:      Height:        General: Pt is alert, awake, not in acute distress, frail, deconditioned Cardiovascular: RRR, S1/S2 +, no rubs, no gallops Respiratory: Decreased air entry at right lung Abdominal: Soft, NT, ND, bowel sounds + Extremities: Trace edema, no cyanosis    The results of significant diagnostics from this hospitalization (including imaging, microbiology, ancillary and laboratory) are listed below for reference.     Microbiology: No results found for this or any previous visit (from the  past 240 hours).   Labs: BNP (last 3 results) Recent Labs    03/19/24 0427 03/20/24 0614 03/21/24 0548  BNP 1,086.3* 1,050.7* 1,089.6*   Basic Metabolic Panel: Recent Labs  Lab 03/21/24 0548 03/22/24 0531 03/23/24 0626 03/23/24 0857 03/23/24 1042 03/23/24 1043 03/24/24 0510 03/25/24 0552 03/26/24 0750 03/27/24 0651  NA  --    < > 137 136   < > 138 138 137 137 138  K  --    < > 4.5 3.9   < > 3.9 4.1 4.1 4.4 4.7  CL  --    < > 94* 93*  --   --  94* 95* 95* 98  CO2  --    < > 38* 37*  --   --  35* 37* 33* 28   GLUCOSE  --    < > 102* 100*  --   --  92 138* 125* 86  BUN  --    < > 39* 39*  --   --  42* 40* 36* 35*  CREATININE  --    < > 1.14 1.03  --   --  1.17 1.17 1.16 1.12  CALCIUM  --    < > 8.8* 8.6*  --   --  8.5* 8.9 9.0 9.0  MG  --   --  2.2  --   --   --  2.2 2.2 2.2  --   PHOS 2.7  --   --   --   --   --   --   --   --   --    < > = values in this interval not displayed.   Liver Function Tests: No results for input(s): AST, ALT, ALKPHOS, BILITOT, PROT, ALBUMIN  in the last 168 hours. No results for input(s): LIPASE, AMYLASE in the last 168 hours. No results for input(s): AMMONIA in the last 168 hours. CBC: Recent Labs  Lab 03/23/24 0626 03/23/24 1042 03/23/24 1043 03/24/24 0510 03/25/24 0552 03/26/24 0750 03/27/24 0651  WBC 5.6  --   --  5.9 6.0 6.1 6.4  NEUTROABS 4.0  --   --  4.4 4.5 4.5 4.6  HGB 13.5   < > 15.0 12.3* 11.9* 12.1* 12.0*  HCT 44.0   < > 44.0 39.0 38.6* 40.3 39.9  MCV 94.4  --   --  93.5 94.6 95.7 97.8  PLT 153  --   --  153 146* 132* 126*   < > = values in this interval not displayed.   Cardiac Enzymes: No results for input(s): CKTOTAL, CKMB, CKMBINDEX, TROPONINI in the last 168 hours. BNP: Invalid input(s): POCBNP CBG: No results for input(s): GLUCAP in the last 168 hours. D-Dimer No results for input(s): DDIMER in the last 72 hours. Hgb A1c No results for input(s): HGBA1C in the last 72 hours. Lipid Profile No results for input(s): CHOL, HDL, LDLCALC, TRIG, CHOLHDL, LDLDIRECT in the last 72 hours. Thyroid  function studies No results for input(s): TSH, T4TOTAL, T3FREE, THYROIDAB in the last 72 hours.  Invalid input(s): FREET3 Anemia work up No results for input(s): VITAMINB12, FOLATE, FERRITIN, TIBC, IRON, RETICCTPCT in the last 72 hours. Urinalysis    Component Value Date/Time   COLORURINE YELLOW 03/18/2024 1745   APPEARANCEUR CLEAR 03/18/2024 1745   LABSPEC 1.020 03/18/2024  1745   PHURINE 5.5 03/18/2024 1745   GLUCOSEU 100 (A) 03/18/2024 1745   HGBUR NEGATIVE 03/18/2024 1745   BILIRUBINUR NEGATIVE 03/18/2024 1745   KETONESUR NEGATIVE 03/18/2024  1745   PROTEINUR NEGATIVE 03/18/2024 1745   NITRITE NEGATIVE 03/18/2024 1745   LEUKOCYTESUR NEGATIVE 03/18/2024 1745   Sepsis Labs Recent Labs  Lab 03/24/24 0510 03/25/24 0552 03/26/24 0750 03/27/24 0651  WBC 5.9 6.0 6.1 6.4   Microbiology No results found for this or any previous visit (from the past 240 hours).   Time coordinating discharge: Over 30 minutes  SIGNED:   Seena Dadds, MD  [facility] 03/27/2024, 9:34 AM Pager   If 7PM-7AM, please contact night-coverage [email or web address] Password [email or web address]

## 2024-03-28 DIAGNOSIS — N401 Enlarged prostate with lower urinary tract symptoms: Secondary | ICD-10-CM | POA: Diagnosis not present

## 2024-03-28 DIAGNOSIS — N39498 Other specified urinary incontinence: Secondary | ICD-10-CM | POA: Diagnosis not present

## 2024-03-28 DIAGNOSIS — I110 Hypertensive heart disease with heart failure: Secondary | ICD-10-CM | POA: Diagnosis not present

## 2024-03-28 DIAGNOSIS — Z556 Problems related to health literacy: Secondary | ICD-10-CM | POA: Diagnosis not present

## 2024-03-28 DIAGNOSIS — E039 Hypothyroidism, unspecified: Secondary | ICD-10-CM | POA: Diagnosis not present

## 2024-03-28 DIAGNOSIS — E559 Vitamin D deficiency, unspecified: Secondary | ICD-10-CM | POA: Diagnosis not present

## 2024-03-28 DIAGNOSIS — Z604 Social exclusion and rejection: Secondary | ICD-10-CM | POA: Diagnosis not present

## 2024-03-28 DIAGNOSIS — Z6822 Body mass index (BMI) 22.0-22.9, adult: Secondary | ICD-10-CM | POA: Diagnosis not present

## 2024-03-28 DIAGNOSIS — H409 Unspecified glaucoma: Secondary | ICD-10-CM | POA: Diagnosis not present

## 2024-03-28 DIAGNOSIS — J9601 Acute respiratory failure with hypoxia: Secondary | ICD-10-CM | POA: Diagnosis not present

## 2024-03-28 DIAGNOSIS — I4819 Other persistent atrial fibrillation: Secondary | ICD-10-CM | POA: Diagnosis not present

## 2024-03-28 DIAGNOSIS — N179 Acute kidney failure, unspecified: Secondary | ICD-10-CM | POA: Diagnosis not present

## 2024-03-28 DIAGNOSIS — D6869 Other thrombophilia: Secondary | ICD-10-CM | POA: Diagnosis not present

## 2024-03-28 DIAGNOSIS — Z7984 Long term (current) use of oral hypoglycemic drugs: Secondary | ICD-10-CM | POA: Diagnosis not present

## 2024-03-28 DIAGNOSIS — Z86711 Personal history of pulmonary embolism: Secondary | ICD-10-CM | POA: Diagnosis not present

## 2024-03-28 DIAGNOSIS — I272 Pulmonary hypertension, unspecified: Secondary | ICD-10-CM | POA: Diagnosis not present

## 2024-03-28 DIAGNOSIS — I50811 Acute right heart failure: Secondary | ICD-10-CM | POA: Diagnosis not present

## 2024-03-28 DIAGNOSIS — Z9981 Dependence on supplemental oxygen: Secondary | ICD-10-CM | POA: Diagnosis not present

## 2024-03-28 DIAGNOSIS — J849 Interstitial pulmonary disease, unspecified: Secondary | ICD-10-CM | POA: Diagnosis not present

## 2024-03-28 DIAGNOSIS — I2510 Atherosclerotic heart disease of native coronary artery without angina pectoris: Secondary | ICD-10-CM | POA: Diagnosis not present

## 2024-03-28 DIAGNOSIS — I5042 Chronic combined systolic (congestive) and diastolic (congestive) heart failure: Secondary | ICD-10-CM | POA: Diagnosis not present

## 2024-03-28 DIAGNOSIS — M1990 Unspecified osteoarthritis, unspecified site: Secondary | ICD-10-CM | POA: Diagnosis not present

## 2024-03-28 DIAGNOSIS — Z7901 Long term (current) use of anticoagulants: Secondary | ICD-10-CM | POA: Diagnosis not present

## 2024-03-28 DIAGNOSIS — E43 Unspecified severe protein-calorie malnutrition: Secondary | ICD-10-CM | POA: Diagnosis not present

## 2024-03-30 ENCOUNTER — Telehealth: Payer: Self-pay | Admitting: Hematology and Oncology

## 2024-03-30 ENCOUNTER — Telehealth (HOSPITAL_COMMUNITY): Payer: Self-pay | Admitting: Cardiology

## 2024-03-30 NOTE — Telephone Encounter (Signed)
 Pts daughter left VM with reports of AFIb on equipment at home Questions if unna boots are needed  Daughter reports afib noted on afib via apple watch HR 110 93-100 irr 76-100 irr Asymptomatic    Daughter reports with 20 mg of lasix , decreased UOP -no swelling noted as pt is wearing unna boots  Please advise

## 2024-03-30 NOTE — Telephone Encounter (Signed)
 Min Is scheduled to see Dr. Arno Bibles on 6/20

## 2024-03-31 MED ORDER — AMIODARONE HCL 200 MG PO TABS: 200.0000 mg | ORAL_TABLET | Freq: Two times a day (BID) | ORAL | 3 refills | Status: DC

## 2024-03-31 NOTE — Telephone Encounter (Signed)
 Pt aware and voiced understanding

## 2024-04-01 ENCOUNTER — Other Ambulatory Visit: Payer: Self-pay

## 2024-04-01 DIAGNOSIS — D6869 Other thrombophilia: Secondary | ICD-10-CM | POA: Diagnosis not present

## 2024-04-01 DIAGNOSIS — N401 Enlarged prostate with lower urinary tract symptoms: Secondary | ICD-10-CM | POA: Diagnosis not present

## 2024-04-01 DIAGNOSIS — E039 Hypothyroidism, unspecified: Secondary | ICD-10-CM | POA: Diagnosis not present

## 2024-04-01 DIAGNOSIS — I4819 Other persistent atrial fibrillation: Secondary | ICD-10-CM | POA: Diagnosis not present

## 2024-04-01 DIAGNOSIS — N179 Acute kidney failure, unspecified: Secondary | ICD-10-CM | POA: Diagnosis not present

## 2024-04-01 DIAGNOSIS — I50811 Acute right heart failure: Secondary | ICD-10-CM | POA: Diagnosis not present

## 2024-04-01 DIAGNOSIS — I272 Pulmonary hypertension, unspecified: Secondary | ICD-10-CM | POA: Diagnosis not present

## 2024-04-01 DIAGNOSIS — Z9981 Dependence on supplemental oxygen: Secondary | ICD-10-CM | POA: Diagnosis not present

## 2024-04-01 DIAGNOSIS — Z7984 Long term (current) use of oral hypoglycemic drugs: Secondary | ICD-10-CM | POA: Diagnosis not present

## 2024-04-01 DIAGNOSIS — Z556 Problems related to health literacy: Secondary | ICD-10-CM | POA: Diagnosis not present

## 2024-04-01 DIAGNOSIS — E559 Vitamin D deficiency, unspecified: Secondary | ICD-10-CM | POA: Diagnosis not present

## 2024-04-01 DIAGNOSIS — Z86711 Personal history of pulmonary embolism: Secondary | ICD-10-CM | POA: Diagnosis not present

## 2024-04-01 DIAGNOSIS — M1990 Unspecified osteoarthritis, unspecified site: Secondary | ICD-10-CM | POA: Diagnosis not present

## 2024-04-01 DIAGNOSIS — J9601 Acute respiratory failure with hypoxia: Secondary | ICD-10-CM | POA: Diagnosis not present

## 2024-04-01 DIAGNOSIS — J849 Interstitial pulmonary disease, unspecified: Secondary | ICD-10-CM | POA: Diagnosis not present

## 2024-04-01 DIAGNOSIS — Z6822 Body mass index (BMI) 22.0-22.9, adult: Secondary | ICD-10-CM | POA: Diagnosis not present

## 2024-04-01 DIAGNOSIS — I5042 Chronic combined systolic (congestive) and diastolic (congestive) heart failure: Secondary | ICD-10-CM | POA: Diagnosis not present

## 2024-04-01 DIAGNOSIS — N39498 Other specified urinary incontinence: Secondary | ICD-10-CM | POA: Diagnosis not present

## 2024-04-01 DIAGNOSIS — I2510 Atherosclerotic heart disease of native coronary artery without angina pectoris: Secondary | ICD-10-CM | POA: Diagnosis not present

## 2024-04-01 DIAGNOSIS — H409 Unspecified glaucoma: Secondary | ICD-10-CM | POA: Diagnosis not present

## 2024-04-01 DIAGNOSIS — I110 Hypertensive heart disease with heart failure: Secondary | ICD-10-CM | POA: Diagnosis not present

## 2024-04-01 DIAGNOSIS — Z604 Social exclusion and rejection: Secondary | ICD-10-CM | POA: Diagnosis not present

## 2024-04-01 DIAGNOSIS — Z7901 Long term (current) use of anticoagulants: Secondary | ICD-10-CM | POA: Diagnosis not present

## 2024-04-01 DIAGNOSIS — E43 Unspecified severe protein-calorie malnutrition: Secondary | ICD-10-CM | POA: Diagnosis not present

## 2024-04-02 ENCOUNTER — Other Ambulatory Visit: Payer: Self-pay | Admitting: Hematology and Oncology

## 2024-04-02 ENCOUNTER — Telehealth: Payer: Self-pay

## 2024-04-02 NOTE — Telephone Encounter (Signed)
 Verbally confirmed appt for 6/20

## 2024-04-03 ENCOUNTER — Inpatient Hospital Stay: Attending: Hematology and Oncology | Admitting: Hematology and Oncology

## 2024-04-03 ENCOUNTER — Inpatient Hospital Stay

## 2024-04-03 ENCOUNTER — Other Ambulatory Visit

## 2024-04-03 VITALS — BP 139/85 | HR 96 | Temp 98.3°F | Resp 18 | Ht 73.0 in | Wt 164.4 lb

## 2024-04-03 DIAGNOSIS — D6869 Other thrombophilia: Secondary | ICD-10-CM | POA: Diagnosis not present

## 2024-04-03 DIAGNOSIS — J9601 Acute respiratory failure with hypoxia: Secondary | ICD-10-CM | POA: Diagnosis not present

## 2024-04-03 DIAGNOSIS — N179 Acute kidney failure, unspecified: Secondary | ICD-10-CM | POA: Diagnosis not present

## 2024-04-03 DIAGNOSIS — J849 Interstitial pulmonary disease, unspecified: Secondary | ICD-10-CM | POA: Diagnosis not present

## 2024-04-03 DIAGNOSIS — Z9981 Dependence on supplemental oxygen: Secondary | ICD-10-CM | POA: Diagnosis not present

## 2024-04-03 DIAGNOSIS — I110 Hypertensive heart disease with heart failure: Secondary | ICD-10-CM | POA: Diagnosis not present

## 2024-04-03 DIAGNOSIS — Z6822 Body mass index (BMI) 22.0-22.9, adult: Secondary | ICD-10-CM | POA: Diagnosis not present

## 2024-04-03 DIAGNOSIS — D472 Monoclonal gammopathy: Secondary | ICD-10-CM

## 2024-04-03 DIAGNOSIS — I4819 Other persistent atrial fibrillation: Secondary | ICD-10-CM | POA: Diagnosis not present

## 2024-04-03 DIAGNOSIS — Z85828 Personal history of other malignant neoplasm of skin: Secondary | ICD-10-CM | POA: Insufficient documentation

## 2024-04-03 DIAGNOSIS — I2510 Atherosclerotic heart disease of native coronary artery without angina pectoris: Secondary | ICD-10-CM | POA: Diagnosis not present

## 2024-04-03 DIAGNOSIS — I272 Pulmonary hypertension, unspecified: Secondary | ICD-10-CM | POA: Diagnosis not present

## 2024-04-03 DIAGNOSIS — I5042 Chronic combined systolic (congestive) and diastolic (congestive) heart failure: Secondary | ICD-10-CM | POA: Diagnosis not present

## 2024-04-03 DIAGNOSIS — Z556 Problems related to health literacy: Secondary | ICD-10-CM | POA: Diagnosis not present

## 2024-04-03 DIAGNOSIS — M1990 Unspecified osteoarthritis, unspecified site: Secondary | ICD-10-CM | POA: Diagnosis not present

## 2024-04-03 DIAGNOSIS — E43 Unspecified severe protein-calorie malnutrition: Secondary | ICD-10-CM | POA: Diagnosis not present

## 2024-04-03 DIAGNOSIS — I50811 Acute right heart failure: Secondary | ICD-10-CM | POA: Diagnosis not present

## 2024-04-03 DIAGNOSIS — E559 Vitamin D deficiency, unspecified: Secondary | ICD-10-CM | POA: Diagnosis not present

## 2024-04-03 DIAGNOSIS — Z604 Social exclusion and rejection: Secondary | ICD-10-CM | POA: Diagnosis not present

## 2024-04-03 DIAGNOSIS — N39498 Other specified urinary incontinence: Secondary | ICD-10-CM | POA: Diagnosis not present

## 2024-04-03 DIAGNOSIS — N401 Enlarged prostate with lower urinary tract symptoms: Secondary | ICD-10-CM | POA: Diagnosis not present

## 2024-04-03 DIAGNOSIS — E039 Hypothyroidism, unspecified: Secondary | ICD-10-CM | POA: Diagnosis not present

## 2024-04-03 DIAGNOSIS — Z7901 Long term (current) use of anticoagulants: Secondary | ICD-10-CM | POA: Diagnosis not present

## 2024-04-03 DIAGNOSIS — H409 Unspecified glaucoma: Secondary | ICD-10-CM | POA: Diagnosis not present

## 2024-04-03 DIAGNOSIS — Z7984 Long term (current) use of oral hypoglycemic drugs: Secondary | ICD-10-CM | POA: Diagnosis not present

## 2024-04-03 DIAGNOSIS — Z86711 Personal history of pulmonary embolism: Secondary | ICD-10-CM | POA: Diagnosis not present

## 2024-04-03 LAB — CBC WITH DIFFERENTIAL/PLATELET
Abs Immature Granulocytes: 0.05 10*3/uL (ref 0.00–0.07)
Basophils Absolute: 0 10*3/uL (ref 0.0–0.1)
Basophils Relative: 0 %
Eosinophils Absolute: 0.1 10*3/uL (ref 0.0–0.5)
Eosinophils Relative: 1 %
HCT: 38.2 % — ABNORMAL LOW (ref 39.0–52.0)
Hemoglobin: 12 g/dL — ABNORMAL LOW (ref 13.0–17.0)
Immature Granulocytes: 1 %
Lymphocytes Relative: 5 %
Lymphs Abs: 0.4 10*3/uL — ABNORMAL LOW (ref 0.7–4.0)
MCH: 29.3 pg (ref 26.0–34.0)
MCHC: 31.4 g/dL (ref 30.0–36.0)
MCV: 93.2 fL (ref 80.0–100.0)
Monocytes Absolute: 0.9 10*3/uL (ref 0.1–1.0)
Monocytes Relative: 12 %
Neutro Abs: 5.6 10*3/uL (ref 1.7–7.7)
Neutrophils Relative %: 81 %
Platelets: 182 10*3/uL (ref 150–400)
RBC: 4.1 MIL/uL — ABNORMAL LOW (ref 4.22–5.81)
RDW: 14.4 % (ref 11.5–15.5)
WBC: 7 10*3/uL (ref 4.0–10.5)
nRBC: 0 % (ref 0.0–0.2)

## 2024-04-03 NOTE — Progress Notes (Incomplete)
 Chief Complaint:   HPI: Samuel Porter is a 83 y.o. male with a PMH of squamous cell carcinoma treated with PD-1, HFrEF with RV failure, pulmonary hypertension, afib with multiple prior failed attempts at cardioversion, HTN.    Patient with a long and complex presentation.  Had been seen at [facility] for squamous cell carcinoma for which he received cemiplimab  and subsequent lymph node removal and surgery.  He did not receive any radiation.  He has been admitted multiple times in the last few months for acute on chronic hypoxic respiratory failure complicated by recurrent right pleural effusion.  He has had multiple liters of fluid drained off, no evidence of metastatic disease, but continued to have issues with fluid retention, atrial fibrillation, and shortness of breath.   He was hypoxic during a clinic appointment so was admitted for recurrent thoracentesis on 03/16/24.  He ultimately had chest tube placed and later developed trapped lung. Advanced Heart Failure consulted d/t RV failure and pulmonary hypertension. Echo with EF 30-35%, RV severely reduced, severely elevated RVSP at 70 mmHg, moderate TR. He was diuresed. RHC showed normal filling pressures, preserved CO and borderline elevated PA pressure. cMRI           ROS: All systems negative except as listed in HPI, PMH and Problem List.  SH:  Social History   Socioeconomic History   Marital status: Married    Spouse name: Not on file   Number of children: Not on file   Years of education: Not on file   Highest education level: Not on file  Occupational History   Not on file  Tobacco Use   Smoking status: Former    Current packs/day: 0.00    Average packs/day: 1.5 packs/day for 10.0 years (15.0 ttl pk-yrs)    Types: Cigarettes    Start date: 38    Quit date: 84    Years since quitting: 55.5   Smokeless tobacco: Never  Substance and Sexual Activity   Alcohol use: Not Currently    Comment: 1-2 glass wine per day   Drug  use: No   Sexual activity: Not on file  Other Topics Concern   Not on file  Social History Narrative   Not on file   Social Drivers of Health   Financial Resource Strain: Not on file  Food Insecurity: Patient Declined (03/17/2024)   Hunger Vital Sign    Worried About Running Out of Food in the Last Year: Patient declined    Ran Out of Food in the Last Year: Patient declined  Transportation Needs: No Transportation Needs (02/04/2024)   Received from [facility]   PRAPARE - Transportation    Lack of Transportation (Medical): No    Lack of Transportation (Non-Medical): No  Physical Activity: Not on file  Stress: Not on file  Social Connections: Not on file  Intimate Partner Violence: Not At Risk (11/30/2022)   Humiliation, Afraid, Rape, and Kick questionnaire    Fear of Current or Ex-Partner: No    Emotionally Abused: No    Physically Abused: No    Sexually Abused: No    FH:  Family History  Problem Relation Age of Onset   Cancer - Ovarian Sister    Lymphoma Sister     Past Medical History:  Diagnosis Date   Arthritis    oa   BPH (benign prostatic hyperplasia)    Cancer (HCC) 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   Cataracts, bilateral  Glaucoma    Hypertension    Vitamin D  deficiency     Current Outpatient Medications  Medication Sig Dispense Refill   acetaminophen  (TYLENOL ) 325 MG tablet Take 650 mg by mouth daily as needed for moderate pain (pain score 4-6) or mild pain (pain score 1-3).     albuterol  (VENTOLIN  HFA) 108 (90 Base) MCG/ACT inhaler Inhale 2 puffs into the lungs every 4 (four) hours as needed for shortness of breath.     amiodarone  (PACERONE ) 200 MG tablet Take 1 tablet (200 mg total) by mouth 2 (two) times daily. 60 tablet 3   cholecalciferol  (VITAMIN D ) 1000 units tablet Take 1,000 Units by mouth daily.     dapagliflozin  propanediol (FARXIGA ) 10 MG TABS tablet Take 1 tablet (10 mg total) by mouth daily before breakfast. 30 tablet 11    docusate sodium  (COLACE) 100 MG capsule Take 1 capsule (100 mg total) by mouth 2 (two) times daily as needed for mild constipation.     dorzolamide -timolol  (COSOPT ) 22.3-6.8 MG/ML ophthalmic solution Place 1 drop into both eyes 2 (two) times daily.     ELIQUIS  5 MG TABS tablet Take 5 mg by mouth 2 (two) times daily.     feeding supplement (ENSURE PLUS HIGH PROTEIN) LIQD Take 237 mLs by mouth 3 (three) times daily between meals.     finasteride  (PROSCAR ) 5 MG tablet Take 5 mg by mouth daily.     furosemide  (LASIX ) 20 MG tablet Take 1 tablet (20 mg total) by mouth daily. 90 tablet 3   levothyroxine  (SYNTHROID ) 50 MCG tablet Take 1 tablet (50 mcg total) by mouth daily at 6 (six) AM. 30 tablet 0   mirtazapine  (REMERON ) 7.5 MG tablet Take 1 tablet (7.5 mg total) by mouth at bedtime. 30 tablet 0   Multiple Vitamin (MULTIVITAMIN WITH MINERALS) TABS tablet Take 1 tablet by mouth daily. 30 tablet 0   Nutritional Supplements (,FEEDING SUPPLEMENT, PROSOURCE PLUS) liquid Take 30 mLs by mouth 2 (two) times daily before lunch and supper. 887 mL 0   polyethylene glycol (MIRALAX  / GLYCOLAX ) 17 g packet Take 17 g by mouth 2 (two) times daily as needed. (Patient taking differently: Take 17 g by mouth daily as needed for moderate constipation or mild constipation.)     No current facility-administered medications for this visit.    There were no vitals filed for this visit.  PHYSICAL EXAM:  General:  Well appearing. No resp difficulty HEENT: normal Neck: supple. JVP flat. Carotids 2+ bilaterally; no bruits. No lymphadenopathy or thryomegaly appreciated. Cor: PMI normal. Regular rate & rhythm. No rubs, gallops or murmurs. Lungs: clear Abdomen: soft, nontender, nondistended. No hepatosplenomegaly. No bruits or masses. Good bowel sounds. Extremities: no cyanosis, clubbing, rash, edema Neuro: alert & orientedx3, cranial nerves grossly intact. Moves all 4 extremities w/o difficulty. Affect  pleasant.   ECG:    ASSESSMENT & PLAN:

## 2024-04-03 NOTE — Progress Notes (Signed)
 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  He elected to be treated at [facility].  He was most recently admitted with lateral lower extremity edema, pleural effusion which was thought to be transudative and he is referred back to hematology for evaluation of possible amyloidosis. Since I last saw him, he has lost a ton of weight and he is in wheelchair today.  He had thoracentesis which showed transudative pleural effusion, negative cytology.  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  Referred back to hematology for further evaluation since there was some concern about amyloidosis.  He had cardiac MRI which did not show any evidence of amyloidosis.  His daughter who is an Field seismologist was also on the phone and apparently her father-in-law was diagnosed with amyloidosis. At this time, he does have monoclonal protein but I do not have any further evidence of amyloidosis however we have agreed to investigate this further if needed in order Along the biopsy lesion, lesion, and abnormal electrolytes and will consider bone marrow aspiration and biopsy with Congo red staining based on the laboratory results  from today.  She is agreeable to this plan.  Will call him next week, telephone visit to review his lab results and discuss further recommendations.  I think CT-guided bone marrow aspiration and biopsy would be preferred in this case.  HISTORY OF PRESENTING ILLNESS:   Samuel Porter 83 y.o. 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  He is here today with his daughter and another daughter was on the phone.  Discussed the use of AI scribe software for clinical note transcription with the patient, who gave verbal consent to proceed.  History of Present Illness Samuel Porter is an 83 year old male who presents with fluid accumulation in the lungs and shortness of breath.  He has been experiencing fluid accumulation in the lungs and shortness of breath since his last checkup at [facility] During a recent hospitalization, he underwent an echocardiogram and a heart procedure. The fluid in his lungs was cultured, and cytology was performed, which did not detect any cancer cells. A CT scan was done after fluid removal to check the lung status, and he has a follow-up CT scan scheduled to monitor fluid levels.  He has a history of cancer for which he received cemiplimab  treatment, completing six cycles followed by surgery. Post-surgery, scans did not show evidence of cancer. However, he has experienced significant weight loss and weakness since the surgery, which he attributes to a  loss of taste and decreased appetite following treatment. He also underwent a hernia operation, which he feels contributed to his decline.  He is  There is a concern for a possible autoimmune condition or  amyloidosis, as he has a monoclonal protein detected in his blood. His sister, an Field seismologist, has been involved in his care and has raised concerns about amyloidosis, given the family history. Further testing is needed to confirm this diagnosis.  He has been experiencing bilateral pitting pedal edema and is currently on a diuretic regimen.  His wife recently passed away, and he was her primary caregiver, which has been a significant emotional and physical burden. This has been a challenging year for him, contributing to his overall decline.  Rest of the pertinent systems were reviewed with the patient and are negative.  MEDICAL HISTORY:  Past Medical History:  Diagnosis Date   Arthritis    oa   BPH (benign prostatic hyperplasia)    Cancer (HCC) 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   Cataracts, bilateral    Glaucoma    Hypertension    Vitamin D  deficiency     SURGICAL HISTORY: Past Surgical History:  Procedure Laterality Date   CARDIOVERSION N/A 03/05/2024   Procedure: CARDIOVERSION;  Surgeon: Hazle Lites, MD;  Location: [facility] INVASIVE CV LAB;  Service: Cardiovascular;  Laterality: N/A;   CARDIOVERSION N/A 03/24/2024   Procedure: CARDIOVERSION;  Surgeon: Darlis Eisenmenger, MD;  Location: [facility] INVASIVE CV LAB;  Service: Cardiovascular;  Laterality: N/A;   CHEST TUBE INSERTION Right 03/18/2024   Procedure: CHEST TUBE INSERTION;  Surgeon: Guerry Leek, MD;  Location: [facility] ENDOSCOPY;  Service: Pulmonary;  Laterality: Right;   CHOLECYSTECTOMY     colonscopy  2017   IR THORACENTESIS ASP PLEURAL SPACE W/IMG GUIDE  03/03/2024   JOINT REPLACEMENT Right    hip    RIGHT HEART CATH N/A 03/23/2024   Procedure: RIGHT HEART CATH;  Surgeon: Darlis Eisenmenger, MD;  Location: [facility] INVASIVE CV LAB;  Service: Cardiovascular;  Laterality: N/A;   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   Procedure: LEFT TOTAL HIP ARTHROPLASTY ANTERIOR APPROACH;  Surgeon: Liliane Rei, MD;  Location: [facility] ORS;  Service:  Orthopedics;  Laterality: Left;   VENTRAL HERNIA REPAIR N/A 07/12/2023   Procedure: HERNIA REPAIR VENTRAL;  Surgeon: Stechschulte, Avon Boers, MD;  Location: [facility] OR;  Service: General;  Laterality: N/A;    SOCIAL HISTORY: Social History   Socioeconomic History   Marital status: Married    Spouse name: Not on file   Number of children: Not on file   Years of education: Not on file   Highest education level: Not on file  Occupational History   Not on file  Tobacco Use   Smoking status: Former    Current packs/day: 0.00    Average packs/day: 1.5 packs/day for 10.0 years (15.0 ttl pk-yrs)    Types: Cigarettes    Start date: 71    Quit date: 61    Years since quitting: 55.5   Smokeless tobacco: Never  Substance and Sexual Activity   Alcohol use: Not Currently    Comment: 1-2 glass wine per day   Drug use: No   Sexual activity: Not on file  Other Topics Concern   Not on file  Social History Narrative   Not on file   Social Drivers of Health   Financial Resource Strain: Not on file  Food Insecurity: Patient Declined (03/17/2024)   Hunger Vital Sign  Worried About Programme researcher, broadcasting/film/video in the Last Year: Patient declined    Barista in the Last Year: Patient declined  Transportation Needs: No Transportation Needs (02/04/2024)   Received from [facility] - Transportation    Lack of Transportation (Medical): No    Lack of Transportation (Non-Medical): No  Physical Activity: Not on file  Stress: Not on file  Social Connections: Not on file  Intimate Partner Violence: Not At Risk (11/30/2022)   Humiliation, Afraid, Rape, and Kick questionnaire    Fear of Current or Ex-Partner: No    Emotionally Abused: No    Physically Abused: No    Sexually Abused: No    FAMILY HISTORY: Family History  Problem Relation Age of Onset   Cancer - Ovarian Sister    Lymphoma Sister     ALLERGIES:  has no known allergies.  MEDICATIONS:  Current Outpatient Medications   Medication Sig Dispense Refill   acetaminophen  (TYLENOL ) 325 MG tablet Take 650 mg by mouth daily as needed for moderate pain (pain score 4-6) or mild pain (pain score 1-3).     albuterol  (VENTOLIN  HFA) 108 (90 Base) MCG/ACT inhaler Inhale 2 puffs into the lungs every 4 (four) hours as needed for shortness of breath.     amiodarone  (PACERONE ) 200 MG tablet Take 1 tablet (200 mg total) by mouth 2 (two) times daily. 60 tablet 3   cholecalciferol  (VITAMIN D ) 1000 units tablet Take 1,000 Units by mouth daily.     dapagliflozin  propanediol (FARXIGA ) 10 MG TABS tablet Take 1 tablet (10 mg total) by mouth daily before breakfast. 30 tablet 11   docusate sodium  (COLACE) 100 MG capsule Take 1 capsule (100 mg total) by mouth 2 (two) times daily as needed for mild constipation.     dorzolamide -timolol  (COSOPT ) 22.3-6.8 MG/ML ophthalmic solution Place 1 drop into both eyes 2 (two) times daily.     ELIQUIS  5 MG TABS tablet Take 5 mg by mouth 2 (two) times daily.     feeding supplement (ENSURE PLUS HIGH PROTEIN) LIQD Take 237 mLs by mouth 3 (three) times daily between meals.     finasteride  (PROSCAR ) 5 MG tablet Take 5 mg by mouth daily.     furosemide  (LASIX ) 20 MG tablet Take 1 tablet (20 mg total) by mouth daily. 90 tablet 3   levothyroxine  (SYNTHROID ) 50 MCG tablet Take 1 tablet (50 mcg total) by mouth daily at 6 (six) AM. 30 tablet 0   mirtazapine  (REMERON ) 7.5 MG tablet Take 1 tablet (7.5 mg total) by mouth at bedtime. 30 tablet 0   Multiple Vitamin (MULTIVITAMIN WITH MINERALS) TABS tablet Take 1 tablet by mouth daily. 30 tablet 0   Nutritional Supplements (,FEEDING SUPPLEMENT, PROSOURCE PLUS) liquid Take 30 mLs by mouth 2 (two) times daily before lunch and supper. 887 mL 0   polyethylene glycol (MIRALAX  / GLYCOLAX ) 17 g packet Take 17 g by mouth 2 (two) times daily as needed. (Patient taking differently: Take 17 g by mouth daily as needed for moderate constipation or mild constipation.)     No current  facility-administered medications for this visit.     PHYSICAL EXAMINATION: ECOG PERFORMANCE STATUS: 0 - Asymptomatic  Vitals:   04/03/24 0908 04/03/24 0909  BP: (!) 141/72 139/85  Pulse: 96   Resp: 18   Temp: 98.3 F (36.8 C)   SpO2: 98%     Filed Weights   04/03/24 0908  Weight: 164 lb 6.4 oz (  74.6 kg)     GENERAL:alert, he is cachectic compared to her last visit. He is in wheel chair, BLE edema, compression socks Bilateral lungs bases with poor air entry On oxygen.  LABORATORY DATA:  I have reviewed the data as listed Lab Results  Component Value Date   WBC 7.0 04/03/2024   HGB 12.0 (L) 04/03/2024   HCT 38.2 (L) 04/03/2024   MCV 93.2 04/03/2024   PLT 182 04/03/2024     Chemistry      Component Value Date/Time   NA 138 03/27/2024 0651   NA 141 03/12/2024 0917   K 4.7 03/27/2024 0651   CL 98 03/27/2024 0651   CO2 28 03/27/2024 0651   BUN 35 (H) 03/27/2024 0651   BUN 18 03/12/2024 0917   CREATININE 1.12 03/27/2024 0651   CREATININE 0.91 11/01/2022 0954      Component Value Date/Time   CALCIUM 9.0 03/27/2024 0651   ALKPHOS 83 03/17/2024 0338   AST 16 03/17/2024 0338   AST 17 11/01/2022 0954   ALT 17 03/17/2024 0338   ALT 16 11/01/2022 0954   BILITOT 0.8 03/17/2024 0338   BILITOT 1.1 11/01/2022 0954       RADIOGRAPHIC STUDIES: I have personally reviewed the radiological images as listed and agreed with the findings in the report. MR CARDIAC VELOCITY FLOW MAP Result Date: 03/26/2024 CLINICAL DATA:  Cardiomyopathy of uncertain etiology, assess for amyloidosis EXAM: CARDIAC MRI TECHNIQUE: The patient was scanned on a 1.5 Tesla GE magnet. A dedicated cardiac coil was used. Functional imaging was done using Fiesta sequences. 2,3, and 4 chamber views were done to assess for RWMA's. Modified Simpson's rule using a short axis stack was used to calculate an ejection fraction on a dedicated work Research officer, trade [employer]. The patient received 8 cc of  Gadavist . After 10 minutes inversion recovery sequences were used to assess for infiltration and scar tissue. FINDINGS: Limited images of the lung fields show a partially loculated-appearing moderate right pleural effusion. There is a small left pleural effusion. Airspace disease at bilateral bases. Small circumferential pericardial effusion, no tamponade. Mildly dilated left ventricular size with normal wall thickness. Global hypokinesis, LV EF 51%. Moderately dilated right ventricle, RV EF 47%. Severe biatrial enlargement. Trileaflet aortic valve. Trivial mitral regurgitation, regurgitant fraction 7%. Difficult delayed enhancement images. There is small area of mid-wall late gadolinium enhancement (LGE) at the basal-mid inferoseptal RV insertion site. Small area of <50% wall thickness subendocardial LGE at the basal inferolateral wall segment. MEASUREMENTS: MEASUREMENTS LVEDV 230 mL LVEDVi 119 mL/m2 LVSV 117 LVEF 51% RVEDV 279 mL RVEDVi 144 mL/m2 RVSV 131 mL RVEF 47% Aortic forward volume 109 mL Aortic regurgitant fraction 0% Global T1 1119, ECV 30% Global T2 58 (elevated) IMPRESSION: 1. Lung findings as above, patient had recent CT chest with complete visualization of lung fields. 2.  Small pericardial effusion without tamponade. 3.  Mildly dilated LV with LV EF 51%, global hypokinesis. 4.  Moderately dilated RV with RV EF 47% (mildly decreased). 5.  Severe biatrial enlargement. 6. Nonspecific inferior RV insertion site LGE, this is nonspecific and can be seen with pressure/volume overload. Small area of <50% wall thickness subendocardial LGE at the basal inferolateral wall, possible coronary disease pattern. 7.  Borderline elevated extracellular volume percentage at 30%. 8.  Elevated global T2, can be seen with inflammation. Study is not highly suggestive of cardiac amyloidosis. Dalton Mclean Electronically Signed   By: [name] M.D.   On: 03/26/2024 07:47  MR CARDIAC VELOCITY FLOW MAP Result Date:  03/26/2024 CLINICAL DATA:  Cardiomyopathy of uncertain etiology, assess for amyloidosis EXAM: CARDIAC MRI TECHNIQUE: The patient was scanned on a 1.5 Tesla GE magnet. A dedicated cardiac coil was used. Functional imaging was done using Fiesta sequences. 2,3, and 4 chamber views were done to assess for RWMA's. Modified Simpson's rule using a short axis stack was used to calculate an ejection fraction on a dedicated work Research officer, trade union. The patient received 8 cc of Gadavist . After 10 minutes inversion recovery sequences were used to assess for infiltration and scar tissue. FINDINGS: Limited images of the lung fields show a partially loculated-appearing moderate right pleural effusion. There is a small left pleural effusion. Airspace disease at bilateral bases. Small circumferential pericardial effusion, no tamponade. Mildly dilated left ventricular size with normal wall thickness. Global hypokinesis, LV EF 51%. Moderately dilated right ventricle, RV EF 47%. Severe biatrial enlargement. Trileaflet aortic valve. Trivial mitral regurgitation, regurgitant fraction 7%. Difficult delayed enhancement images. There is small area of mid-wall late gadolinium enhancement (LGE) at the basal-mid inferoseptal RV insertion site. Small area of <50% wall thickness subendocardial LGE at the basal inferolateral wall segment. MEASUREMENTS: MEASUREMENTS LVEDV 230 mL LVEDVi 119 mL/m2 LVSV 117 LVEF 51% RVEDV 279 mL RVEDVi 144 mL/m2 RVSV 131 mL RVEF 47% Aortic forward volume 109 mL Aortic regurgitant fraction 0% Global T1 1119, ECV 30% Global T2 58 (elevated) IMPRESSION: 1. Lung findings as above, patient had recent CT chest with complete visualization of lung fields. 2.  Small pericardial effusion without tamponade. 3.  Mildly dilated LV with LV EF 51%, global hypokinesis. 4.  Moderately dilated RV with RV EF 47% (mildly decreased). 5.  Severe biatrial enlargement. 6. Nonspecific inferior RV insertion site LGE, this is  nonspecific and can be seen with pressure/volume overload. Small area of <50% wall thickness subendocardial LGE at the basal inferolateral wall, possible coronary disease pattern. 7.  Borderline elevated extracellular volume percentage at 30%. 8.  Elevated global T2, can be seen with inflammation. Study is not highly suggestive of cardiac amyloidosis. Dalton Mclean Electronically Signed   By: [name] M.D.   On: 03/26/2024 07:46   MR CARDIAC MORPHOLOGY W WO CONTRAST Result Date: 03/26/2024 CLINICAL DATA:  Cardiomyopathy of uncertain etiology, assess for amyloidosis EXAM: CARDIAC MRI TECHNIQUE: The patient was scanned on a 1.5 Tesla GE magnet. A dedicated cardiac coil was used. Functional imaging was done using Fiesta sequences. 2,3, and 4 chamber views were done to assess for RWMA's. Modified Simpson's rule using a short axis stack was used to calculate an ejection fraction on a dedicated work Research officer, trade union. The patient received 8 cc of Gadavist . After 10 minutes inversion recovery sequences were used to assess for infiltration and scar tissue. FINDINGS: Limited images of the lung fields show a partially loculated-appearing moderate right pleural effusion. There is a small left pleural effusion. Airspace disease at bilateral bases. Small circumferential pericardial effusion, no tamponade. Mildly dilated left ventricular size with normal wall thickness. Global hypokinesis, LV EF 51%. Moderately dilated right ventricle, RV EF 47%. Severe biatrial enlargement. Trileaflet aortic valve. Trivial mitral regurgitation, regurgitant fraction 7%. Difficult delayed enhancement images. There is small area of mid-wall late gadolinium enhancement (LGE) at the basal-mid inferoseptal RV insertion site. Small area of <50% wall thickness subendocardial LGE at the basal inferolateral wall segment. MEASUREMENTS: MEASUREMENTS LVEDV 230 mL LVEDVi 119 mL/m2 LVSV 117 LVEF 51% RVEDV 279 mL RVEDVi 144 mL/m2 RVSV 131  mL  RVEF 47% Aortic forward volume 109 mL Aortic regurgitant fraction 0% Global T1 1119, ECV 30% Global T2 58 (elevated) IMPRESSION: 1. Lung findings as above, patient had recent CT chest with complete visualization of lung fields. 2.  Small pericardial effusion without tamponade. 3.  Mildly dilated LV with LV EF 51%, global hypokinesis. 4.  Moderately dilated RV with RV EF 47% (mildly decreased). 5.  Severe biatrial enlargement. 6. Nonspecific inferior RV insertion site LGE, this is nonspecific and can be seen with pressure/volume overload. Small area of <50% wall thickness subendocardial LGE at the basal inferolateral wall, possible coronary disease pattern. 7.  Borderline elevated extracellular volume percentage at 30%. 8.  Elevated global T2, can be seen with inflammation. Study is not highly suggestive of cardiac amyloidosis. Dalton Mclean Electronically Signed   By: [name] M.D.   On: 03/26/2024 07:46   DG Chest Port 1 View Result Date: 03/26/2024 CLINICAL DATA:  Shortness of breath. EXAM: PORTABLE CHEST 1 VIEW COMPARISON:  03/24/2024 FINDINGS: Right apical pneumothorax seen on the previous study has become less prominent in the interval with no discrete pleural line visible on the current exam. Diffuse interstitial opacity persists in a similar fashion with right base collapse/consolidation/scarring in right pleural effusion, similar to prior. The cardio pericardial silhouette is enlarged. Telemetry leads overlie the chest. IMPRESSION: 1. Interval decrease in prominence of right apical pneumothorax. 2. No other significant interval change. Electronically Signed   By: Donnal Fusi M.D.   On: 03/26/2024 06:18   EP STUDY Result Date: 03/24/2024 See surgical note for result.  DG Swallowing Func-Speech Pathology Result Date: 03/24/2024 Table formatting from the original result was not included. Modified Barium Swallow Study [name] MRN: [id number] Date of Birth:  December 23, 1940 Today's Date: 03/24/2024 HPI/PMH: HPI: YOHANN CURL is an 83 yo male presenting to ED 6/2 with worsening shortness of breath x3 days. Found to have A-fib RVR, acute on chronic diastolic HF, recurrent R>L pleural effusion, and hypoxic respiratory failure. CT Chest shows large, multiloculated R hydropneumothorax with chest tube in place as well as unchanged underlying pulmonary fibrosis but assessment limited by pleural effusions and atelectasis. MBS ordered per MD. PMH includes BPH, glaucoma, HTN, A-fib on anticoagulation with recent cardioversion x2 weeks PTA, history of SCC, unintentional weight loss Clinical Impression: Clinical Impression: Pt presents with a functional oropharyngeal swallow. No penetration/aspiration occurred throughout the study and there was no significant residue. The 13 mm barium tablet was given with thin liquids and was [facility], without noted esophageal retention or retrograde backflow. Education completed with pt and his daughter. Recommend he continue his current diet without ongoing SLP f/u. Will sign off at this time. Factors that may increase risk of adverse event in presence of aspiration Roderick Civatte & Jessy Morocco 2021): Factors that may increase risk of adverse event in presence of aspiration Roderick Civatte & Jessy Morocco 2021): Poor general health and/or compromised immunity; Respiratory or GI disease; Frail or deconditioned Recommendations/Plan: Swallowing Evaluation Recommendations Swallowing Evaluation Recommendations Recommendations: PO diet PO Diet Recommendation: Regular; Thin liquids (Level 0) Liquid Administration via: Cup; Straw Medication Administration: Whole meds with liquid Supervision: Patient able to self-feed Swallowing strategies  : Minimize environmental distractions; Slow rate; Small bites/sips Postural changes: Position pt fully upright for meals Oral care recommendations: Oral care BID (2x/day) Treatment Plan Treatment Plan Treatment recommendations: No treatment recommended  at this time Follow-up recommendations: No SLP follow up Functional status assessment: Patient has not had a recent decline in their functional  status. Recommendations Recommendations for follow up therapy are one component of a multi-disciplinary discharge planning process, led by the attending physician.  Recommendations may be updated based on patient status, additional functional criteria and insurance authorization. Assessment: Orofacial Exam: Orofacial Exam Oral Cavity: Oral Hygiene: WFL Oral Cavity - Dentition: Adequate natural dentition Orofacial Anatomy: WFL Oral Motor/Sensory Function: WFL Anatomy: Anatomy: Suspected cervical osteophytes Boluses Administered: Boluses Administered Boluses Administered: Thin liquids (Level 0); Mildly thick liquids (Level 2, nectar thick); Moderately thick liquids (Level 3, honey thick); Puree; Solid  Oral Impairment Domain: Oral Impairment Domain Lip Closure: No labial escape Tongue control during bolus hold: Cohesive bolus between tongue to palatal seal Bolus preparation/mastication: Timely and efficient chewing and mashing Bolus transport/lingual motion: Brisk tongue motion Oral residue: Complete oral clearance Location of oral residue : N/A Initiation of pharyngeal swallow : Posterior angle of the ramus  Pharyngeal Impairment Domain: Pharyngeal Impairment Domain Soft palate elevation: No bolus between soft palate (SP)/pharyngeal wall (PW) Laryngeal elevation: Complete superior movement of thyroid  cartilage with complete approximation of arytenoids to epiglottic petiole Anterior hyoid excursion: Complete anterior movement Epiglottic movement: Complete inversion Laryngeal vestibule closure: Complete, no air/contrast in laryngeal vestibule Pharyngeal stripping wave : Present - complete Pharyngeal contraction (A/P view only): N/A Pharyngoesophageal segment opening: Complete distension and complete duration, no obstruction of flow Tongue base retraction: No contrast between  tongue base and posterior pharyngeal wall (PPW) Pharyngeal residue: Complete pharyngeal clearance Location of pharyngeal residue: N/A  Esophageal Impairment Domain: Esophageal Impairment Domain Esophageal clearance upright position: Complete clearance, esophageal coating Pill: Pill Consistency administered: Thin liquids (Level 0) Thin liquids (Level 0): [facility] Penetration/Aspiration Scale Score: Penetration/Aspiration Scale Score 1.  Material does not enter airway: Thin liquids (Level 0); Mildly thick liquids (Level 2, nectar thick); Puree; Pill; Solid; Moderately thick liquids (Level 3, honey thick) Compensatory Strategies: Compensatory Strategies Compensatory strategies: No   General Information: Caregiver present: Yes (daughter)  Diet Prior to this Study: Regular; Thin liquids (Level 0)   Temperature : Normal   Respiratory Status: WFL   Supplemental O2: Nasal cannula   History of Recent Intubation: No  Behavior/Cognition: Alert; Cooperative; Pleasant mood Self-Feeding Abilities: Able to self-feed Baseline vocal quality/speech: Normal Volitional Cough: Able to elicit Volitional Swallow: Able to elicit Exam Limitations: No limitations Goal Planning: Prognosis for improved oropharyngeal function: Good No data recorded No data recorded Patient/Family Stated Goal: want to get to root of illness Consulted and agree with results and recommendations: Patient; Family member/caregiver; Physician Pain: Pain Assessment Pain Assessment: Faces Faces Pain Scale: 0 Pain Intervention(s): Monitored during session End of Session: Start Time:SLP Start Time (ACUTE ONLY): 1002 Stop Time: SLP Stop Time (ACUTE ONLY): 1014 Time Calculation:SLP Time Calculation (min) (ACUTE ONLY): 12 min Charges: SLP Evaluations $ SLP Speech Visit: 1 Visit SLP Evaluations $MBS Swallow: 1 Procedure SLP visit diagnosis: SLP Visit Diagnosis: Dysphagia, unspecified (R13.10) Past Medical History: Past Medical History: Diagnosis Date  Arthritis   oa  BPH (benign  prostatic hyperplasia)   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  Cataracts, bilateral   Glaucoma   Hypertension   Vitamin D  deficiency  Past Surgical History: Past Surgical History: Procedure Laterality Date  CARDIOVERSION N/A 03/05/2024  Procedure: CARDIOVERSION;  Surgeon: Hazle Lites, MD;  Location: [facility] INVASIVE CV LAB;  Service: Cardiovascular;  Laterality: N/A;  CHEST TUBE INSERTION Right 03/18/2024  Procedure: CHEST TUBE INSERTION;  Surgeon: Guerry Leek, MD;  Location: [facility] ENDOSCOPY;  Service: Pulmonary;  Laterality: Right;  CHOLECYSTECTOMY    colonscopy  2017  IR THORACENTESIS ASP PLEURAL SPACE W/IMG GUIDE  03/03/2024  JOINT REPLACEMENT Right   hip   RIGHT HEART CATH N/A 03/23/2024  Procedure: RIGHT HEART CATH;  Surgeon: Darlis Eisenmenger, MD;  Location: [facility] INVASIVE CV LAB;  Service: Cardiovascular;  Laterality: N/A;  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  Procedure: LEFT TOTAL HIP ARTHROPLASTY ANTERIOR APPROACH;  Surgeon: Liliane Rei, MD;  Location: [facility] ORS;  Service: Orthopedics;  Laterality: Left;  VENTRAL HERNIA REPAIR N/A 07/12/2023  Procedure: HERNIA REPAIR VENTRAL;  Surgeon: Marny Sires Avon Boers, MD;  Location: [facility] OR;  Service: General;  Laterality: N/A; Amil Kale, M.A., CCC-SLP Speech Language Pathology, Acute Rehabilitation Services Secure Chat preferred [phone] 03/24/2024, 12:27 PM  DG Chest Port 1 View Result Date: 03/24/2024 CLINICAL DATA:  83 year old male with shortness of breath. Abnormal chest CT 3 days ago. EXAM: PORTABLE CHEST 1 VIEW COMPARISON:  Chest CT 03/21/2024 and earlier. FINDINGS: Portable AP semi upright view at 0607 hours. A right lung base pleural pigtail catheter has been removed since the recent CT. Right lung pneumothorax with irregular pleural edge is visible in the apex has not significantly changed since 03/20/2024. And otherwise the lung volumes and bilateral ventilation have not significantly changed from the recent CT. Stable cardiac size and  mediastinal contours. Visualized tracheal air column is within normal limits. Stable visualized osseous structures.  Negative visible bowel gas. IMPRESSION: Right pleural pigtail catheter removal, but otherwise no change in bilateral lung abnormality and ventilation from the CT on 03/21/2024 (please see that report). Unresolved right side pneumothorax has not significantly changed since 03/20/2024. Electronically Signed   By: Marlise Simpers M.D.   On: 03/24/2024 06:57   VAS US  LOWER EXTREMITY VENOUS (DVT) Result Date: 03/23/2024  Lower Venous DVT Study Patient Name:  Samuel Porter  Date of Exam:   03/23/2024 Medical Rec #: [id number]        Accession #:    [id number] Date of Birth: [birth year]         Patient Gender: M Patient Age:   21 years Exam Location:  [facility] Procedure:      VAS US  LOWER EXTREMITY VENOUS (DVT) Referring Phys: Felton Hough --------------------------------------------------------------------------------  Indications: Edema.  Risk Factors: Cancer. Comparison Study: No prior studies. Performing Technologist: Lerry Ransom RVT  Examination Guidelines: A complete evaluation includes B-mode imaging, spectral Doppler, color Doppler, and power Doppler as needed of all accessible portions of each vessel. Bilateral testing is considered an integral part of a complete examination. Limited examinations for reoccurring indications may be performed as noted. The reflux portion of the exam is performed with the patient in reverse Trendelenburg.  +---------+---------------+---------+-----------+----------+--------------+ RIGHT    CompressibilityPhasicitySpontaneityPropertiesThrombus Aging +---------+---------------+---------+-----------+----------+--------------+ CFV      Full           Yes      Yes                                 +---------+---------------+---------+-----------+----------+--------------+ SFJ      Full                                                         +---------+---------------+---------+-----------+----------+--------------+ FV Prox  Full                                                        +---------+---------------+---------+-----------+----------+--------------+  FV Mid   Full                                                        +---------+---------------+---------+-----------+----------+--------------+ FV DistalFull                                                        +---------+---------------+---------+-----------+----------+--------------+ PFV      Full                                                        +---------+---------------+---------+-----------+----------+--------------+ POP      Full           Yes      Yes                                 +---------+---------------+---------+-----------+----------+--------------+ PTV      Full                                                        +---------+---------------+---------+-----------+----------+--------------+ PERO     Full                                                        +---------+---------------+---------+-----------+----------+--------------+   +---------+---------------+---------+-----------+----------+--------------+ LEFT     CompressibilityPhasicitySpontaneityPropertiesThrombus Aging +---------+---------------+---------+-----------+----------+--------------+ CFV      Full           Yes      Yes                                 +---------+---------------+---------+-----------+----------+--------------+ SFJ      Full                                                        +---------+---------------+---------+-----------+----------+--------------+ FV Prox  Full                                                        +---------+---------------+---------+-----------+----------+--------------+ FV Mid   Full                                                         +---------+---------------+---------+-----------+----------+--------------+  FV DistalFull                                                        +---------+---------------+---------+-----------+----------+--------------+ PFV      Full                                                        +---------+---------------+---------+-----------+----------+--------------+ POP      Full           Yes      Yes                                 +---------+---------------+---------+-----------+----------+--------------+ PTV      Full                                                        +---------+---------------+---------+-----------+----------+--------------+ PERO     Full                                                        +---------+---------------+---------+-----------+----------+--------------+     Summary: RIGHT: - There is no evidence of deep vein thrombosis in the lower extremity.  - No cystic structure found in the popliteal fossa.  LEFT: - There is no evidence of deep vein thrombosis in the lower extremity.  - No cystic structure found in the popliteal fossa.  *See table(s) above for measurements and observations. Electronically signed by Irvin Mantel on 03/23/2024 at 4:40:08 PM.    Final    CARDIAC CATHETERIZATION Result Date: 03/23/2024 1. Normal left and right heart filling pressures. 2. Borderline elevated PA pressure. 3. Preserved cardiac output. We can stop IV Lasix , start po Lasix  tomorrow.   CT Chest High Resolution Result Date: 03/21/2024 CLINICAL DATA:  Interstitial lung disease, pleural effusion, trapped lung, query pleural metastases * Tracking Code: BO * EXAM: CT CHEST WITHOUT CONTRAST TECHNIQUE: Multidetector CT imaging of the chest was performed following the standard protocol without intravenous contrast. High resolution imaging of the lungs, as well as inspiratory and expiratory imaging, was performed. RADIATION DOSE REDUCTION: This exam was performed according  to the departmental dose-optimization program which includes automated exposure control, adjustment of the mA and/or kV according to patient size and/or use of iterative reconstruction technique. COMPARISON:  03/04/2024 FINDINGS: Cardiovascular: Aortic atherosclerosis. Cardiomegaly. Scattered left coronary artery calcifications. Enlargement of the main pulmonary artery measuring up to 3.5 cm in caliber. Similar small pericardial effusion. Mediastinum/Nodes: Unchanged prominent mediastinal and hilar lymph nodes. Thyroid  gland, trachea, and esophagus demonstrate no significant findings. Lungs/Pleura: Large, multiloculated right hydropneumothorax with a pigtail chest tube about the right lung base and a small pneumothorax component. Unchanged pattern of mild underlying pulmonary fibrosis, assessment limited by the presence of pleural effusions and atelectasis, however generally featuring irregular peripheral  and peribronchovascular interstitial opacity and ground-glass, without clear apical to basal gradient significant evidence of traction bronchiectasis, nor honeycombing. No significant air trapping on expiratory phase imaging. Small left pleural effusion, new compared to prior examination. Upper Abdomen: No acute abnormality. Musculoskeletal: No chest wall abnormality. No acute osseous findings. IMPRESSION: 1. Large, multiloculated right hydropneumothorax with a pigtail chest tube about the right lung base and a small pneumothorax component. 2. 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  3. Small left pleural effusion, new compared to prior examination. 4. Unchanged prominent mediastinal and hilar lymph nodes, likely reactive. 5. Cardiomegaly and coronary artery disease. 6. Enlargement of the main pulmonary artery, as can be seen in pulmonary hypertension. Aortic Atherosclerosis (ICD10-I70.0). Electronically Signed   By: Fredricka Jenny M.D.   On: 03/21/2024 14:12   ECHOCARDIOGRAM LIMITED Result Date: 03/20/2024    ECHOCARDIOGRAM LIMITED REPORT   Patient Name:   KYEL PURK Date of Exam: 03/20/2024 Medical Rec #:  [id number]       Height:       73.0 in Accession #:    [id number]      Weight:       166.2 lb Date of Birth:  [birth year]        BSA:          1.989 m Patient Age:    82 years        BP:           129/77 mmHg Patient Gender: M               HR:           122 bpm. Exam Location:  Inpatient Procedure: 2D Echo, Cardiac Doppler and Color Doppler (Both Spectral and Color            Flow Doppler were utilized during procedure). Indications:    CHF  History:        Patient has prior history of Echocardiogram examinations, most                 recent 12/29/2023. Pleural effusion.  Sonographer:    Jeralene Mom Referring Phys: [id number] BRIDGETTE CHRISTOPHER IMPRESSIONS  1. Left ventricular ejection fraction, by estimation, is 30 to 35%. The left ventricle has moderately decreased function. The left ventricle demonstrates global hypokinesis. The left ventricular internal cavity size was mildly dilated. Left ventricular diastolic parameters are indeterminate.  2. Abnormal septal motion and septal flattening suggesting significant pulmnary HTN. Right ventricular systolic function is severely reduced. The right ventricular size is moderately enlarged. Moderately increased right ventricular wall thickness. There  is severely elevated pulmonary artery systolic pressure.  3. Left atrial size was moderately dilated.  4. Right atrial size was severely dilated.  5. The mitral valve is abnormal. Mild mitral  valve regurgitation. No evidence of mitral stenosis.  6. Tricuspid valve regurgitation is moderate.  7. The aortic valve is tricuspid. Aortic valve regurgitation is not visualized. No aortic stenosis is present.  8. The inferior vena cava is dilated in size with <50% respiratory variability, suggesting right atrial pressure of 15 mmHg. FINDINGS  Left Ventricle: Left ventricular ejection fraction, by estimation, is 30 to 35%. The  left ventricle has moderately decreased function. The left ventricle demonstrates global hypokinesis. Strain was performed and the global longitudinal strain is indeterminate. The left ventricular internal cavity size was mildly dilated. There is no left ventricular hypertrophy. Left ventricular diastolic parameters are indeterminate. Right Ventricle: Abnormal septal motion and septal flattening suggesting significant pulmnary HTN. The right ventricular size is moderately enlarged. Moderately increased right ventricular wall thickness. Right ventricular systolic function is severely reduced. There is severely elevated pulmonary artery systolic pressure. The tricuspid regurgitant velocity is 3.73 m/s, and with an assumed right atrial pressure of 15 mmHg, the estimated right ventricular systolic pressure is 70.7 mmHg. Left Atrium: Left atrial size was moderately dilated. Right Atrium: Right atrial size was severely dilated. Pericardium: Trivial pericardial effusion is present. The pericardial effusion is posterior to the left ventricle. Mitral Valve: The mitral valve is abnormal. There is mild thickening of the mitral valve leaflet(s). Mild mitral valve regurgitation. No evidence of mitral valve stenosis. Tricuspid Valve: The tricuspid valve is normal in structure. Tricuspid valve regurgitation is moderate . No evidence of tricuspid stenosis. Aortic Valve: The aortic valve is tricuspid. Aortic valve regurgitation is not visualized. No aortic stenosis is present. Aortic valve mean gradient  measures 2.0 mmHg. Aortic valve peak gradient measures 4.3 mmHg. Aortic valve area, by VTI measures 2.19 cm. Pulmonic Valve: The pulmonic valve was normal in structure. Pulmonic valve regurgitation is not visualized. No evidence of pulmonic stenosis. Aorta: The aortic root is normal in size and structure. Venous: The inferior vena cava is dilated in size with less than 50% respiratory variability, suggesting right atrial pressure of 15 mmHg. IAS/Shunts: No atrial level shunt detected by color flow Doppler. Additional Comments: 3D was performed not requiring image post processing on an independent workstation and was indeterminate.  LEFT VENTRICLE PLAX 2D LVIDd:         4.40 cm   Diastology LVIDs:         3.70 cm   LV e' medial:    3.48 cm/s LV PW:         0.90 cm   LV E/e' medial:  27.7 LV IVS:        0.90 cm   LV e' lateral:   10.60 cm/s LVOT diam:     2.10 cm   LV E/e' lateral: 9.1 LV SV:         37 LV SV Index:   18 LVOT Area:     3.46 cm  RIGHT VENTRICLE RV Basal diam:  4.80 cm RV Mid diam:    4.00 cm RV S prime:     9.03 cm/s TAPSE (M-mode): 0.9 cm LEFT ATRIUM             Index        RIGHT ATRIUM           Index LA diam:        4.40 cm 2.21 cm/m   RA Area:     34.70 cm LA Vol (A2C):   93.5 ml 47.01 ml/m  RA Volume:   134.00 ml 67.37 ml/m LA Vol (A4C):   58.9 ml 29.61 ml/m LA Biplane Vol: 75.1 ml 37.76 ml/m  AORTIC VALVE AV Area (Vmax):    2.47 cm AV Area (Vmean):   2.32 cm AV Area (VTI):     2.19 cm AV Vmax:           104.00 cm/s AV Vmean:          68.400 cm/s  AV VTI:            0.168 m AV Peak Grad:      4.3 mmHg AV Mean Grad:      2.0 mmHg LVOT Vmax:         74.10 cm/s LVOT Vmean:        45.800 cm/s LVOT VTI:          0.106 m LVOT/AV VTI ratio: 0.63  AORTA Ao Asc diam: 3.60 cm MITRAL VALVE               TRICUSPID VALVE MV Area (PHT): 5.54 cm    TR Peak grad:   55.7 mmHg MV Decel Time: 137 msec    TR Vmax:        373.00 cm/s MR Peak grad: 34.3 mmHg MR Vmax:      293.00 cm/s  SHUNTS MV E velocity:  96.40 cm/s  Systemic VTI:  0.11 m MV A velocity: 30.00 cm/s  Systemic Diam: 2.10 cm MV E/A ratio:  3.21 Janelle Mediate MD Electronically signed by Janelle Mediate MD Signature Date/Time: 03/20/2024/3:59:37 PM    Final    DG CHEST PORT 1 VIEW Result Date: 03/20/2024 CLINICAL DATA:  Chest tube in place. EXAM: PORTABLE CHEST 1 VIEW COMPARISON:  March 19, 2024. FINDINGS: Stable cardiomediastinal silhouette. Mild central pulmonary vascular congestion is noted with possible bilateral pulmonary edema. Stable right-sided chest tube with stable small right hydropneumothorax. Bony thorax is unremarkable. IMPRESSION: Stable right-sided chest tube with stable small right hydropneumothorax. Mild central pulmonary vascular congestion with possible bilateral pulmonary edema. Electronically Signed   By: Rosalene Colon M.D.   On: 03/20/2024 08:22   DG CHEST PORT 1 VIEW Result Date: 03/19/2024 CLINICAL DATA:  [id number] Chest tube in place [id number]. EXAM: PORTABLE CHEST 1 VIEW COMPARISON:  03/19/2024, 6:31 a.m. FINDINGS: There is small residual right pneumothorax, decreased since the recent prior study, status post presumed pleural drainage catheter exchange. Redemonstration of chronic interstitial and alveolar opacities throughout bilateral lungs without significant interval change. No left pneumothorax. There is blunting of bilateral lateral costophrenic angles, concerning for underlying bilateral trace pleural effusions. Stable cardio-mediastinal silhouette. No acute osseous abnormalities. The soft tissues are within normal limits. IMPRESSION: 1. Small residual right pneumothorax, decreased since the recent prior study, status post presumed pleural drainage catheter exchange. 2. Redemonstration of chronic interstitial and alveolar opacities throughout bilateral lungs without significant interval change. Electronically Signed   By: Beula Brunswick M.D.   On: 03/19/2024 11:36   DG Chest Port 1 View Result Date: 03/19/2024 CLINICAL DATA:   Shortness of breath EXAM: PORTABLE CHEST 1 VIEW COMPARISON:  Chest x-ray performed March 18, 2024 FINDINGS: A right basilar pneumothorax is present which extends along the periphery into the right apex. The basilar component has increased. The right basilar pigtail catheter is possibly kinked. Interstitial airspace opacities are present in both lungs, similar. Heart remains enlarged. IMPRESSION: 1. The right basilar pigtail catheter is possibly kinked. 2. Modest worsening of right basilar component of pneumothorax. These results will be called to the ordering clinician or representative by the Radiologist Assistant, and communication documented in the PACS or [employer]. Electronically Signed   By: Reagan Camera M.D.   On: 03/19/2024 06:57   DG Chest Port 1 View Result Date: 03/18/2024 CLINICAL DATA:  Right chest tube placement for a large right pleural effusion. EXAM: PORTABLE CHEST 1 VIEW COMPARISON:  03/17/2024 FINDINGS: Interval pigtail pleural catheter at the right lung base with resolution  of the previously demonstrated right pleural effusion. This has been replaced with an approximately 35% right pneumothorax without mediastinal shift. The cardiac silhouette is borderline enlarged. Patchy density is again demonstrated at the left lung base there is some patchy and linear density in the partially re-expanded right lung. Stable mild chronic interstitial prominence. Thoracic spine degenerative changes. IMPRESSION: 1. Approximately 35% right pneumothorax without mediastinal shift. 2. Interval pigtail pleural catheter placement at the right lung base with resolution of the previously demonstrated right pleural effusion. 3. Patchy and linear density in the partially re-expanded right lung, likely due to atelectasis with possible pneumonia/re-expansion pulmonary edema. 4. Stable patchy density at the left lung base suspicious for pneumonia. These results will be called to the ordering clinician or  representative by the Radiologist Assistant, and communication documented in the PACS or [employer]. Electronically Signed   By: Catherin Closs M.D.   On: 03/18/2024 15:01   US  RENAL Result Date: 03/17/2024 CLINICAL DATA:  Acute kidney injury EXAM: RENAL / URINARY TRACT ULTRASOUND COMPLETE COMPARISON:  CT 07/11/2023 FINDINGS: Right Kidney: Renal measurements: 11.1 x 3.9 x 5.3 cm = volume: 120 mL. Echogenicity within normal limits. No mass or hydronephrosis visualized. Left Kidney: Renal measurements: 12.4 x 5.2 x 5.3 cm = volume: 179 mL. Echogenicity within normal limits. No mass or hydronephrosis visualized. Bladder: Soft tissue protruding along the inferior bladder from the region of the prostate. Is difficult to determine if this is related to the prostate or bladder wall. Other: Prostate enlargement. IMPRESSION: No hydronephrosis. Prostate enlargement. Soft tissue nodular area projecting along the inferior bladder measuring up to 2.1 cm, difficult to determine if this is related to the prostate or bladder wall. When compared to prior CT, I favor this is related to enlarged prostate. This could be further evaluated with CT with contrast with delayed images through the bladder if felt clinically indicated. Electronically Signed   By: Janeece Mechanic M.D.   On: 03/17/2024 14:37   DG Chest 2 View Result Date: 03/17/2024 CLINICAL DATA:  Dyspnea EXAM: CHEST - 2 VIEW COMPARISON:  March 16, 2024 FINDINGS: Persistent large right pleural effusion with right lower lobe atelectasis Persistent bilateral pulmonary infiltrates that could correlate with congestive changes Heart normal size IMPRESSION: No change compared with prior Electronically Signed   By: Fredrich Jefferson M.D.   On: 03/17/2024 08:39   DG Chest 1 View Result Date: 03/16/2024 CLINICAL DATA:  History of right pleural effusion EXAM: CHEST  1 VIEW COMPARISON:  Chest radiograph dated 03/03/2024 FINDINGS: Dense right lower lung opacity. Patchy left lower lung  opacity. Increased large right pleural effusion. No pneumothorax. Right heart border is obscured. No acute osseous abnormality. Right axillary surgical clips. IMPRESSION: 1. Increased large right pleural effusion with dense right lower lung opacity, likely atelectasis. 2. Patchy left lower lung opacity, likely atelectasis. Electronically Signed   By: Limin  Xu M.D.   On: 03/16/2024 11:30   CT CHEST HIGH RESOLUTION Result Date: 03/12/2024 CLINICAL DATA:  Interstitial lung disease, pleural effusion. EXAM: CT CHEST WITHOUT CONTRAST TECHNIQUE: Multidetector CT imaging of the chest was performed following the standard protocol without intravenous contrast. High resolution imaging of the lungs, as well as inspiratory and expiratory imaging, was performed. RADIATION DOSE REDUCTION: This exam was performed according to the departmental dose-optimization program which includes automated exposure control, adjustment of the mA and/or kV according to patient size and/or use of iterative reconstruction technique. COMPARISON:  02/04/2024 and PET 11/27/2022. FINDINGS: Cardiovascular: Atherosclerotic calcification of  the aorta, aortic valve and coronary arteries. Enlarged pulmonic trunk and heart. No pericardial effusion. Mediastinum/Nodes: Thoracic inlet lymph nodes are not enlarged by CT size criteria. Mediastinal lymph nodes measure up to 11 mm in the low right paratracheal station and are likely reactive in etiology. Hilar regions are difficult to definitively evaluate without IV contrast. No axillary adenopathy. Esophagus is grossly unremarkable. Lungs/Pleura: Large partially loculated right pleural effusion with collapse/consolidation in the right middle and right lower lobes, slightly improved from 02/04/2024. Patchy coarsened ground-glass, interlobular and interlobular septal thickening and traction bronchiectasis. Calcified granulomas. Trace left pleural effusion. Airway is unremarkable. No air trapping. Upper Abdomen:  Visualized portions of the liver, adrenal glands, left kidney, spleen, pancreas, stomach and bowel are grossly unremarkable. No upper abdominal adenopathy. Musculoskeletal: Degenerative changes in the spine. Flowing anterior osteophytosis in the thoracic spine. IMPRESSION: 1. Large partially loculated right pleural effusion with collapse/consolidation in the right middle and right lower lobes, slightly improved from 02/04/2024. 2. Patchy coarsened pulmonary parenchymal ground-glass with interlobular and intralobular septal thickening, similar to 11/27/2022. 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 In the absence of air trapping, fibrotic hypersensitivity pneumonitis is considered less likely. Findings are suggestive of an alternative diagnosis (not UIP) per consensus guidelines: Diagnosis of Idiopathic Pulmonary Fibrosis: An Official ATS/ERS/JRS/ALAT Clinical Practice Guideline. Am Annie Barton Crit Care Med Vol 198, Iss 5, [phone], Jun 15 2017. 3. Trace left pleural effusion. 4. Aortic atherosclerosis (ICD10-I70.0). Coronary artery calcification. 5. Enlarged pulmonic trunk, indicative of pulmonary arterial hypertension. Electronically Signed   By: Shearon Denis M.D.   On: 03/12/2024 15:36   EP STUDY Result Date: 03/05/2024 See surgical note for result.   All questions were answered. The patient knows to call the clinic with any problems, questions or concerns. I spent 40 minutes in the care of this patient including H and P, review of records, counseling and coordination of care.     Murleen Arms, MD 04/03/2024 11:31 AM

## 2024-04-04 LAB — IGG, IGA, IGM
IgA: 167 mg/dL (ref 61–437)
IgG (Immunoglobin G), Serum: 1884 mg/dL — ABNORMAL HIGH (ref 603–1613)
IgM (Immunoglobulin M), Srm: 170 mg/dL — ABNORMAL HIGH (ref 15–143)

## 2024-04-05 LAB — BETA 2 MICROGLOBULIN, SERUM: Beta-2 Microglobulin: 4.1 mg/L — ABNORMAL HIGH (ref 0.6–2.4)

## 2024-04-06 ENCOUNTER — Ambulatory Visit (HOSPITAL_COMMUNITY)
Admit: 2024-04-06 | Discharge: 2024-04-06 | Disposition: A | Discharge status: Home / Self Care | Source: Ambulatory Visit | Attending: Physician Assistant | Admitting: Physician Assistant

## 2024-04-06 ENCOUNTER — Encounter: Payer: Self-pay | Admitting: *Deleted

## 2024-04-06 ENCOUNTER — Encounter (HOSPITAL_COMMUNITY): Payer: Self-pay

## 2024-04-06 VITALS — BP 110/70 | HR 102 | Wt 161.0 lb

## 2024-04-06 DIAGNOSIS — I5022 Chronic systolic (congestive) heart failure: Secondary | ICD-10-CM | POA: Diagnosis not present

## 2024-04-06 DIAGNOSIS — I4891 Unspecified atrial fibrillation: Secondary | ICD-10-CM | POA: Diagnosis not present

## 2024-04-06 DIAGNOSIS — I272 Pulmonary hypertension, unspecified: Secondary | ICD-10-CM | POA: Insufficient documentation

## 2024-04-06 DIAGNOSIS — I5023 Acute on chronic systolic (congestive) heart failure: Secondary | ICD-10-CM | POA: Insufficient documentation

## 2024-04-06 DIAGNOSIS — Z85828 Personal history of other malignant neoplasm of skin: Secondary | ICD-10-CM | POA: Diagnosis not present

## 2024-04-06 DIAGNOSIS — I5032 Chronic diastolic (congestive) heart failure: Secondary | ICD-10-CM

## 2024-04-06 DIAGNOSIS — J849 Interstitial pulmonary disease, unspecified: Secondary | ICD-10-CM | POA: Diagnosis not present

## 2024-04-06 DIAGNOSIS — R600 Localized edema: Secondary | ICD-10-CM | POA: Diagnosis not present

## 2024-04-06 DIAGNOSIS — C44329 Squamous cell carcinoma of skin of other parts of face: Secondary | ICD-10-CM | POA: Diagnosis not present

## 2024-04-06 DIAGNOSIS — I50812 Chronic right heart failure: Secondary | ICD-10-CM | POA: Diagnosis not present

## 2024-04-06 DIAGNOSIS — Z79899 Other long term (current) drug therapy: Secondary | ICD-10-CM | POA: Diagnosis not present

## 2024-04-06 DIAGNOSIS — I480 Paroxysmal atrial fibrillation: Secondary | ICD-10-CM

## 2024-04-06 DIAGNOSIS — I110 Hypertensive heart disease with heart failure: Secondary | ICD-10-CM | POA: Diagnosis not present

## 2024-04-06 DIAGNOSIS — J90 Pleural effusion, not elsewhere classified: Secondary | ICD-10-CM | POA: Diagnosis not present

## 2024-04-06 LAB — KAPPA/LAMBDA LIGHT CHAINS
Kappa free light chain: 82.1 mg/L — ABNORMAL HIGH (ref 3.3–19.4)
Kappa, lambda light chain ratio: 2.07 — ABNORMAL HIGH (ref 0.26–1.65)
Lambda free light chains: 39.6 mg/L — ABNORMAL HIGH (ref 5.7–26.3)

## 2024-04-06 NOTE — Patient Instructions (Signed)
 Great to see you today!   Follow-Up in: 4 weeks as scheduled with Dr. Zenaida   At the [facility], you and your health needs are our priority. We have a designated team specialized in the treatment of Heart Failure. This Care Team includes your primary Heart Failure Specialized Cardiologist (physician), Advanced Practice Providers (APPs- Physician Assistants and Nurse Practitioners), and Pharmacist who all work together to provide you with the care you need, when you need it.   You may see any of the following providers on your designated Care Team at your next follow up:  Dr. Toribio Fuel Dr. Ezra Shuck Dr. Ria Commander Dr. Odis Zenaida Greig Mosses, NP Caffie Shed, [place] [facility] [place], [place] Beckey Coe, NP [place] Lee, NP Tinnie Redman, PharmD   Please be sure to bring in all your medications bottles to every appointment.   Need to Contact Us :  If you have any questions or concerns before your next appointment please send us  a message through [place] or call our office at [phone].    TO LEAVE A MESSAGE FOR THE NURSE SELECT OPTION 2, PLEASE LEAVE A MESSAGE INCLUDING: YOUR NAME DATE OF BIRTH CALL BACK NUMBER REASON FOR CALL**this is important as we prioritize the call backs  YOU WILL RECEIVE A CALL BACK THE SAME DAY AS LONG AS YOU CALL BEFORE 4:00 PM

## 2024-04-07 ENCOUNTER — Other Ambulatory Visit: Payer: Self-pay

## 2024-04-07 ENCOUNTER — Encounter: Payer: Self-pay | Admitting: *Deleted

## 2024-04-07 DIAGNOSIS — D472 Monoclonal gammopathy: Secondary | ICD-10-CM

## 2024-04-07 DIAGNOSIS — Z85828 Personal history of other malignant neoplasm of skin: Secondary | ICD-10-CM | POA: Diagnosis not present

## 2024-04-08 ENCOUNTER — Ambulatory Visit: Admitting: Emergency Medicine

## 2024-04-08 NOTE — Progress Notes (Deleted)
  Cardiology Office Note:    Date:  04/08/2024  ID:  Samuel Porter, DOB 04-27-1941, MRN [id number] PCP: Kip Righter, MD  [facility] Providers Cardiologist:  Ozell Fell, MD Electrophysiologist:  Will Gladis Norton, MD { Click to update primary MD,subspecialty MD or APP then REFRESH:1}    {Click to Open Review  :1}   Patient Profile:       Chief Complaint: *** History of Present Illness:  Samuel Porter is a 83 y.o. male with visit-pertinent history of squamous cell carcinoma treated with PD-1, HFrEF with RV failure, pulmonary hypertension, afib with multiple prior failed attempts at cardioversion, HTN   Patient has a long complex medical history.  He had been seen at [facility] for squamous cell carcinoma which he received cemiplimab  and subsequent lymph node removal and surgery.  He did not receive any radiation.  He has been admitted multiple times in the last few months for pleural effusion.  He has had multiple liters of fluid drained off, no evidence of metastatic disease but continued to have issues with fluid retention, atrial fibrillation, and shortness of breath.  Best recently he was hypoxic.  Clinic appointments he was admitted for recurrent thoracentesis on 03/2024.  He ultimately had chest tube placed and later developed trapped lung.  Advanced heart failure clinic consulted due to RV failure and pulmonary hypertension.  Echocardiogram with EF 30-35%, RV severely reduced, severely elevated RVSP at 70 mmHg, moderate TR.  RHC shows normal filling pressures, preserved CO and borderline elevated PA pressure.  Cardiac MRI with LVEF 51% and no evidence of cardiac amyloidosis.  However did have myeloma panel and monoclonal IgG.  Prior to discharge she underwent DCCV to sinus rhythm.  Recently he had called the clinic to report recurrent A-fib on his Apple Watch and his amiodarone  was increased to 200 mg twice daily.  He was last seen in office by heart failure clinic on 04/06/2024.   Patient has been doing well at this time.  Discussed the use of AI scribe software for clinical note transcription with the patient, who gave verbal consent to proceed.  History of Present Illness     Review of systems:  Please see the history of present illness. All other systems are reviewed and otherwise negative. ***      Studies Reviewed:        ***  Risk Assessment/Calculations:   {Does this patient have ATRIAL FIBRILLATION?[phone]} No BP recorded.  {Refresh Note OR Click here to enter BP  :1}***        Physical Exam:   VS:  There were no vitals taken for this visit.   Wt Readings from Last 3 Encounters:  04/06/24 161 lb (73 kg)  04/03/24 164 lb 6.4 oz (74.6 kg)  03/26/24 157 lb 10.1 oz (71.5 kg)    GEN: Well nourished, well developed in no acute distress NECK: No JVD; No carotid bruits CARDIAC: ***RRR, no murmurs, rubs, gallops RESPIRATORY:  Clear to auscultation without rales, wheezing or rhonchi  ABDOMEN: Soft, non-tender, non-distended EXTREMITIES:  No edema; No acute deformity ***      Assessment and Plan:    Assessment and Plan Assessment & Plan      {Are you ordering a CV Procedure (e.g. stress test, cath, DCCV, TEE, etc)?   Press F2        :[id number]}  Dispo:  No follow-ups on file.  Signed, Lum LITTIE Louis, NP

## 2024-04-09 ENCOUNTER — Ambulatory Visit: Admitting: Pulmonary Disease

## 2024-04-09 ENCOUNTER — Inpatient Hospital Stay: Admitting: Hematology and Oncology

## 2024-04-09 ENCOUNTER — Encounter: Payer: Self-pay | Admitting: Pulmonary Disease

## 2024-04-09 ENCOUNTER — Other Ambulatory Visit: Payer: Self-pay | Admitting: Pulmonary Disease

## 2024-04-09 ENCOUNTER — Ambulatory Visit (INDEPENDENT_AMBULATORY_CARE_PROVIDER_SITE_OTHER)

## 2024-04-09 VITALS — BP 134/80 | HR 89 | Ht 73.0 in | Wt 159.6 lb

## 2024-04-09 DIAGNOSIS — I50811 Acute right heart failure: Secondary | ICD-10-CM | POA: Diagnosis not present

## 2024-04-09 DIAGNOSIS — I5043 Acute on chronic combined systolic (congestive) and diastolic (congestive) heart failure: Secondary | ICD-10-CM

## 2024-04-09 DIAGNOSIS — J90 Pleural effusion, not elsewhere classified: Secondary | ICD-10-CM

## 2024-04-09 DIAGNOSIS — N179 Acute kidney failure, unspecified: Secondary | ICD-10-CM | POA: Diagnosis not present

## 2024-04-09 DIAGNOSIS — J9601 Acute respiratory failure with hypoxia: Secondary | ICD-10-CM

## 2024-04-09 DIAGNOSIS — J939 Pneumothorax, unspecified: Secondary | ICD-10-CM | POA: Diagnosis not present

## 2024-04-09 DIAGNOSIS — I4819 Other persistent atrial fibrillation: Secondary | ICD-10-CM | POA: Diagnosis not present

## 2024-04-09 DIAGNOSIS — E039 Hypothyroidism, unspecified: Secondary | ICD-10-CM | POA: Diagnosis not present

## 2024-04-09 DIAGNOSIS — Z7984 Long term (current) use of oral hypoglycemic drugs: Secondary | ICD-10-CM | POA: Diagnosis not present

## 2024-04-09 DIAGNOSIS — I110 Hypertensive heart disease with heart failure: Secondary | ICD-10-CM | POA: Diagnosis not present

## 2024-04-09 DIAGNOSIS — Z87891 Personal history of nicotine dependence: Secondary | ICD-10-CM

## 2024-04-09 DIAGNOSIS — D472 Monoclonal gammopathy: Secondary | ICD-10-CM | POA: Diagnosis not present

## 2024-04-09 DIAGNOSIS — Z9981 Dependence on supplemental oxygen: Secondary | ICD-10-CM | POA: Diagnosis not present

## 2024-04-09 DIAGNOSIS — I5042 Chronic combined systolic (congestive) and diastolic (congestive) heart failure: Secondary | ICD-10-CM | POA: Diagnosis not present

## 2024-04-09 DIAGNOSIS — M1990 Unspecified osteoarthritis, unspecified site: Secondary | ICD-10-CM | POA: Diagnosis not present

## 2024-04-09 DIAGNOSIS — J849 Interstitial pulmonary disease, unspecified: Secondary | ICD-10-CM | POA: Diagnosis not present

## 2024-04-09 DIAGNOSIS — Z86711 Personal history of pulmonary embolism: Secondary | ICD-10-CM | POA: Diagnosis not present

## 2024-04-09 DIAGNOSIS — D6869 Other thrombophilia: Secondary | ICD-10-CM | POA: Diagnosis not present

## 2024-04-09 DIAGNOSIS — I2510 Atherosclerotic heart disease of native coronary artery without angina pectoris: Secondary | ICD-10-CM | POA: Diagnosis not present

## 2024-04-09 DIAGNOSIS — N401 Enlarged prostate with lower urinary tract symptoms: Secondary | ICD-10-CM | POA: Diagnosis not present

## 2024-04-09 DIAGNOSIS — Z6822 Body mass index (BMI) 22.0-22.9, adult: Secondary | ICD-10-CM | POA: Diagnosis not present

## 2024-04-09 DIAGNOSIS — Z604 Social exclusion and rejection: Secondary | ICD-10-CM | POA: Diagnosis not present

## 2024-04-09 DIAGNOSIS — Z7901 Long term (current) use of anticoagulants: Secondary | ICD-10-CM | POA: Diagnosis not present

## 2024-04-09 DIAGNOSIS — E559 Vitamin D deficiency, unspecified: Secondary | ICD-10-CM | POA: Diagnosis not present

## 2024-04-09 DIAGNOSIS — H409 Unspecified glaucoma: Secondary | ICD-10-CM | POA: Diagnosis not present

## 2024-04-09 DIAGNOSIS — E43 Unspecified severe protein-calorie malnutrition: Secondary | ICD-10-CM | POA: Diagnosis not present

## 2024-04-09 DIAGNOSIS — N39498 Other specified urinary incontinence: Secondary | ICD-10-CM | POA: Diagnosis not present

## 2024-04-09 DIAGNOSIS — Z556 Problems related to health literacy: Secondary | ICD-10-CM | POA: Diagnosis not present

## 2024-04-09 DIAGNOSIS — I272 Pulmonary hypertension, unspecified: Secondary | ICD-10-CM | POA: Diagnosis not present

## 2024-04-09 LAB — UPEP/TP, 24-HR URINE
Albumin, U: 100 %
Alpha 1, Urine: 0 %
Alpha 2, Urine: 0 %
Beta, Urine: 0 %
Gamma Globulin, Urine: 0 %
Total Protein, Urine-Ur/day: 109 mg/(24.h) (ref 30–150)
Total Protein, Urine: 6.6 mg/dL
Total Volume: 1650

## 2024-04-09 NOTE — Patient Instructions (Signed)
 VISIT SUMMARY:  You came in today for a follow-up on your pulmonary condition, specifically your recurrent pleural effusion and atrial fibrillation. We discussed your recent experiences, including hospitalizations and treatments, and reviewed your current management plan.  YOUR PLAN:  -PLEURAL EFFUSION: Pleural effusion is the buildup of excess fluid between the layers of the pleura outside the lungs. We will monitor your condition with a follow-up chest x-ray in three months. If the effusion recurs or worsens, we may consider drainage or a procedure called pleurodesis.  -ACUTE AND CHRONIC HEART FAILURE: Heart failure means your heart is not pumping blood as well as it should, which can lead to fluid buildup in your lungs. We will continue to monitor your condition and support your nutrition to prevent fluid overload.  -ATRIAL FIBRILLATION: Atrial fibrillation is an irregular and often rapid heart rate that can increase your risk of strokes and heart disease. Your condition was managed with cardioversion during your recent hospitalization, and we will continue to monitor it along with your heart failure.  -MALNUTRITION: Malnutrition occurs when your body does not get enough nutrients. This can contribute to fluid buildup in your body. You should continue with your high-protein diet and consider adding protein shakes to optimize your nutrition.  -AMYLOIDOSIS: Amyloidosis is a condition where abnormal protein builds up in your organs, potentially affecting heart and lung function. We discussed its potential impact, though it is not typically associated with pleural effusion.  -OXYGEN THERAPY: You are currently using oxygen therapy to help with your breathing. We will reassess your oxygen needs in three months with a repeat x-ray and evaluation of your oxygen levels.  INSTRUCTIONS:  Please follow up in three months for a repeat chest x-ray and evaluation of your oxygen levels. Continue with your  high-protein diet and consider adding protein shakes to your routine. If you experience any worsening symptoms or have concerns, contact our office.

## 2024-04-09 NOTE — Progress Notes (Signed)
 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  He elected to be treated at [facility].  He was most recently admitted with lateral lower extremity edema, pleural effusion which was thought to be transudative and he is referred back to hematology for evaluation of possible amyloidosis.  According to daughter, Samuel Porter is doing better.  He is getting stronger, eating better.  There is very reluctant to proceed with bone marrow aspiration and biopsy at this time.  MSK suggested to repeat labs in about 4 weeks and patient is in agreement with this plan.  I think this is a reasonable idea.  Based on our lab review from last appointment, his kappa lambda light chain ratio is normal, his creatinine is normal, beta-2 microglobulin levels are elevated.  He has mild anemia which could very well be from anemia from disease.  He will return to clinic Monday for likely repeat labs.  If labs suggest further investigation, we will arrange a CT guided BMB.  HISTORY OF PRESENTING ILLNESS:   Samuel Porter 83 y.o. 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  He is here today with his daughter and another daughter was on the phone.  Discussed the use of AI scribe software for clinical note transcription with the patient, who gave verbal consent to proceed.  History of Present Illness Samuel Porter is an 83 year old male who presents for telephone visit to review his lab results. His daughter Rollo was present on the phone.  Samuel Porter and Ms. Gatling both mention that he has been doing better since he got discharged from the hospital.  He is getting stronger, eating better.  He would like to postpone the bone marrow biopsy for.  They want to repeat blood work and a bone marrow biopsy still warranted  Rest of the pertinent systems were reviewed with the patient and are negative.  MEDICAL HISTORY:  Past Medical History:  Diagnosis Date   Arthritis    oa   BPH (benign prostatic hyperplasia)    Cancer (HCC) 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   Cataracts, bilateral    Glaucoma    Hypertension    Vitamin D  deficiency     SURGICAL HISTORY: Past Surgical History:  Procedure Laterality Date   CARDIOVERSION N/A 03/05/2024   Procedure: CARDIOVERSION;  Surgeon: Mona Vinie BROCKS, MD;  Location: [facility] INVASIVE CV LAB;  Service: Cardiovascular;  Laterality: N/A;   CARDIOVERSION N/A 03/24/2024   Procedure: CARDIOVERSION;  Surgeon: Rolan Ezra RAMAN, MD;  Location: [facility] INVASIVE CV LAB;  Service: Cardiovascular;  Laterality: N/A;  CHEST TUBE INSERTION Right 03/18/2024   Procedure: CHEST TUBE INSERTION;  Surgeon: Annella Donnice SAUNDERS, MD;  Location: [facility] ENDOSCOPY;  Service: Pulmonary;  Laterality: Right;   CHOLECYSTECTOMY     colonscopy  2017   IR THORACENTESIS ASP PLEURAL SPACE W/IMG GUIDE  03/03/2024   JOINT REPLACEMENT Right    hip    RIGHT HEART CATH N/A 03/23/2024   Procedure: RIGHT  HEART CATH;  Surgeon: Rolan Ezra RAMAN, MD;  Location: [facility] INVASIVE CV LAB;  Service: Cardiovascular;  Laterality: N/A;   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   Procedure: LEFT TOTAL HIP ARTHROPLASTY ANTERIOR APPROACH;  Surgeon: Dempsey Moan, MD;  Location: [facility] ORS;  Service: Orthopedics;  Laterality: Left;   VENTRAL HERNIA REPAIR N/A 07/12/2023   Procedure: HERNIA REPAIR VENTRAL;  Surgeon: Stechschulte, Deward PARAS, MD;  Location: [facility] OR;  Service: General;  Laterality: N/A;    SOCIAL HISTORY: Social History   Socioeconomic History   Marital status: Married    Spouse name: Not on file   Number of children: Not on file   Years of education: Not on file   Highest education level: Not on file  Occupational History   Not on file  Tobacco Use   Smoking status: Former    Current packs/day: 0.00    Average packs/day: 1.5 packs/day for 10.0 years (15.0 ttl pk-yrs)    Types: Cigarettes    Start date: 33    Quit date: 67    Years since quitting: 55.5   Smokeless tobacco: Never  Substance and Sexual Activity   Alcohol use: Not Currently    Comment: 1-2 glass wine per day   Drug use: No   Sexual activity: Not on file  Other Topics Concern   Not on file  Social History Narrative   Not on file   Social Drivers of Health   Financial Resource Strain: Not on file  Food Insecurity: Patient Declined (03/17/2024)   Hunger Vital Sign    Worried About Running Out of Food in the Last Year: Patient declined    Ran Out of Food in the Last Year: Patient declined  Transportation Needs: No Transportation Needs (02/04/2024)   Received from [facility]   PRAPARE - Transportation    Lack of Transportation (Medical): No    Lack of Transportation (Non-Medical): No  Physical Activity: Not on file  Stress: Not on file  Social Connections: Not on file  Intimate Partner Violence: Not At Risk (11/30/2022)   Humiliation, Afraid, Rape, and Kick questionnaire    Fear of Current or Ex-Partner: No     Emotionally Abused: No    Physically Abused: No    Sexually Abused: No    FAMILY HISTORY: Family History  Problem Relation Age of Onset   Cancer - Ovarian Sister    Lymphoma Sister     ALLERGIES:  has no known allergies.  MEDICATIONS:  Current Outpatient Medications  Medication Sig Dispense Refill   acetaminophen  (TYLENOL ) 325 MG tablet Take 650 mg by mouth daily as needed for moderate pain (pain score 4-6) or mild pain (pain score 1-3).     albuterol  (VENTOLIN  HFA) 108 (90 Base) MCG/ACT inhaler Inhale 2 puffs into the lungs every 4 (four) hours as needed for shortness of breath.     amiodarone  (PACERONE ) 200 MG tablet Take 1 tablet (200 mg total) by mouth 2 (two) times daily. 60 tablet 3   cholecalciferol  (VITAMIN D ) 1000 units tablet Take 1,000 Units by mouth daily.  dapagliflozin  propanediol (FARXIGA ) 10 MG TABS tablet Take 1 tablet (10 mg total) by mouth daily before breakfast. 30 tablet 11   docusate sodium  (COLACE) 100 MG capsule Take 1 capsule (100 mg total) by mouth 2 (two) times daily as needed for mild constipation.     dorzolamide -timolol  (COSOPT ) 22.3-6.8 MG/ML ophthalmic solution Place 1 drop into both eyes 2 (two) times daily.     ELIQUIS  5 MG TABS tablet Take 5 mg by mouth 2 (two) times daily.     feeding supplement (ENSURE PLUS HIGH PROTEIN) LIQD Take 237 mLs by mouth 3 (three) times daily between meals.     finasteride  (PROSCAR ) 5 MG tablet Take 5 mg by mouth daily.     furosemide  (LASIX ) 20 MG tablet Take 1 tablet (20 mg total) by mouth daily. 90 tablet 3   levothyroxine  (SYNTHROID ) 50 MCG tablet Take 1 tablet (50 mcg total) by mouth daily at 6 (six) AM. 30 tablet 0   mirtazapine  (REMERON ) 7.5 MG tablet Take 1 tablet (7.5 mg total) by mouth at bedtime. 30 tablet 0   Multiple Vitamin (MULTIVITAMIN WITH MINERALS) TABS tablet Take 1 tablet by mouth daily. 30 tablet 0   Nutritional Supplements (,FEEDING SUPPLEMENT, PROSOURCE PLUS) liquid Take 30 mLs by mouth 2 (two)  times daily before lunch and supper. 887 mL 0   polyethylene glycol (MIRALAX  / GLYCOLAX ) 17 g packet Take 17 g by mouth 2 (two) times daily as needed.     No current facility-administered medications for this visit.     PHYSICAL EXAMINATION: ECOG PERFORMANCE STATUS: 0 - Asymptomatic  PE deferred in lieu of counseling  LABORATORY DATA:  I have reviewed the data as listed Lab Results  Component Value Date   WBC 7.0 04/03/2024   HGB 12.0 (L) 04/03/2024   HCT 38.2 (L) 04/03/2024   MCV 93.2 04/03/2024   PLT 182 04/03/2024     Chemistry      Component Value Date/Time   NA 138 03/27/2024 0651   NA 141 03/12/2024 0917   K 4.7 03/27/2024 0651   CL 98 03/27/2024 0651   CO2 28 03/27/2024 0651   BUN 35 (H) 03/27/2024 0651   BUN 18 03/12/2024 0917   CREATININE 1.12 03/27/2024 0651   CREATININE 0.91 11/01/2022 0954      Component Value Date/Time   CALCIUM 9.0 03/27/2024 0651   ALKPHOS 83 03/17/2024 0338   AST 16 03/17/2024 0338   AST 17 11/01/2022 0954   ALT 17 03/17/2024 0338   ALT 16 11/01/2022 0954   BILITOT 0.8 03/17/2024 0338   BILITOT 1.1 11/01/2022 0954       RADIOGRAPHIC STUDIES: I have personally reviewed the radiological images as listed and agreed with the findings in the report. MR CARDIAC VELOCITY FLOW MAP Result Date: 03/26/2024 CLINICAL DATA:  Cardiomyopathy of uncertain etiology, assess for amyloidosis EXAM: CARDIAC MRI TECHNIQUE: The patient was scanned on a 1.5 Tesla GE magnet. A dedicated cardiac coil was used. Functional imaging was done using Fiesta sequences. 2,3, and 4 chamber views were done to assess for RWMA's. Modified Simpson's rule using a short axis stack was used to calculate an ejection fraction on a dedicated work [employer]. The patient received 8 cc of Gadavist . After 10 minutes inversion recovery sequences were used to assess for infiltration and scar tissue. FINDINGS: Limited images of the lung fields show a partially  loculated-appearing moderate right pleural effusion. There is a small left pleural effusion. Airspace disease at  bilateral bases. Small circumferential pericardial effusion, no tamponade. Mildly dilated left ventricular size with normal wall thickness. Global hypokinesis, LV EF 51%. Moderately dilated right ventricle, RV EF 47%. Severe biatrial enlargement. Trileaflet aortic valve. Trivial mitral regurgitation, regurgitant fraction 7%. Difficult delayed enhancement images. There is small area of mid-wall late gadolinium enhancement (LGE) at the basal-mid inferoseptal RV insertion site. Small area of <50% wall thickness subendocardial LGE at the basal inferolateral wall segment. MEASUREMENTS: MEASUREMENTS LVEDV 230 mL LVEDVi 119 mL/m2 LVSV 117 LVEF 51% RVEDV 279 mL RVEDVi 144 mL/m2 RVSV 131 mL RVEF 47% Aortic forward volume 109 mL Aortic regurgitant fraction 0% Global T1 1119, ECV 30% Global T2 58 (elevated) IMPRESSION: 1. Lung findings as above, patient had recent CT chest with complete visualization of lung fields. 2.  Small pericardial effusion without tamponade. 3.  Mildly dilated LV with LV EF 51%, global hypokinesis. 4.  Moderately dilated RV with RV EF 47% (mildly decreased). 5.  Severe biatrial enlargement. 6. Nonspecific inferior RV insertion site LGE, this is nonspecific and can be seen with pressure/volume overload. Small area of <50% wall thickness subendocardial LGE at the basal inferolateral wall, possible coronary disease pattern. 7.  Borderline elevated extracellular volume percentage at 30%. 8.  Elevated global T2, can be seen with inflammation. Study is not highly suggestive of cardiac amyloidosis. Dalton Mclean Electronically Signed   By: Ezra Shuck M.D.   On: 03/26/2024 07:47   MR CARDIAC VELOCITY FLOW MAP Result Date: 03/26/2024 CLINICAL DATA:  Cardiomyopathy of uncertain etiology, assess for amyloidosis EXAM: CARDIAC MRI TECHNIQUE: The patient was scanned on a 1.5 Tesla GE magnet. A  dedicated cardiac coil was used. Functional imaging was done using Fiesta sequences. 2,3, and 4 chamber views were done to assess for RWMA's. Modified Simpson's rule using a short axis stack was used to calculate an ejection fraction on a dedicated work [employer]. The patient received 8 cc of Gadavist . After 10 minutes inversion recovery sequences were used to assess for infiltration and scar tissue. FINDINGS: Limited images of the lung fields show a partially loculated-appearing moderate right pleural effusion. There is a small left pleural effusion. Airspace disease at bilateral bases. Small circumferential pericardial effusion, no tamponade. Mildly dilated left ventricular size with normal wall thickness. Global hypokinesis, LV EF 51%. Moderately dilated right ventricle, RV EF 47%. Severe biatrial enlargement. Trileaflet aortic valve. Trivial mitral regurgitation, regurgitant fraction 7%. Difficult delayed enhancement images. There is small area of mid-wall late gadolinium enhancement (LGE) at the basal-mid inferoseptal RV insertion site. Small area of <50% wall thickness subendocardial LGE at the basal inferolateral wall segment. MEASUREMENTS: MEASUREMENTS LVEDV 230 mL LVEDVi 119 mL/m2 LVSV 117 LVEF 51% RVEDV 279 mL RVEDVi 144 mL/m2 RVSV 131 mL RVEF 47% Aortic forward volume 109 mL Aortic regurgitant fraction 0% Global T1 1119, ECV 30% Global T2 58 (elevated) IMPRESSION: 1. Lung findings as above, patient had recent CT chest with complete visualization of lung fields. 2.  Small pericardial effusion without tamponade. 3.  Mildly dilated LV with LV EF 51%, global hypokinesis. 4.  Moderately dilated RV with RV EF 47% (mildly decreased). 5.  Severe biatrial enlargement. 6. Nonspecific inferior RV insertion site LGE, this is nonspecific and can be seen with pressure/volume overload. Small area of <50% wall thickness subendocardial LGE at the basal inferolateral wall, possible coronary disease  pattern. 7.  Borderline elevated extracellular volume percentage at 30%. 8.  Elevated global T2, can be seen with inflammation. Study is not highly  suggestive of cardiac amyloidosis. Dalton Mclean Electronically Signed   By: Ezra Shuck M.D.   On: 03/26/2024 07:46   MR CARDIAC MORPHOLOGY W WO CONTRAST Result Date: 03/26/2024 CLINICAL DATA:  Cardiomyopathy of uncertain etiology, assess for amyloidosis EXAM: CARDIAC MRI TECHNIQUE: The patient was scanned on a 1.5 Tesla GE magnet. A dedicated cardiac coil was used. Functional imaging was done using Fiesta sequences. 2,3, and 4 chamber views were done to assess for RWMA's. Modified Simpson's rule using a short axis stack was used to calculate an ejection fraction on a dedicated work [employer]. The patient received 8 cc of Gadavist . After 10 minutes inversion recovery sequences were used to assess for infiltration and scar tissue. FINDINGS: Limited images of the lung fields show a partially loculated-appearing moderate right pleural effusion. There is a small left pleural effusion. Airspace disease at bilateral bases. Small circumferential pericardial effusion, no tamponade. Mildly dilated left ventricular size with normal wall thickness. Global hypokinesis, LV EF 51%. Moderately dilated right ventricle, RV EF 47%. Severe biatrial enlargement. Trileaflet aortic valve. Trivial mitral regurgitation, regurgitant fraction 7%. Difficult delayed enhancement images. There is small area of mid-wall late gadolinium enhancement (LGE) at the basal-mid inferoseptal RV insertion site. Small area of <50% wall thickness subendocardial LGE at the basal inferolateral wall segment. MEASUREMENTS: MEASUREMENTS LVEDV 230 mL LVEDVi 119 mL/m2 LVSV 117 LVEF 51% RVEDV 279 mL RVEDVi 144 mL/m2 RVSV 131 mL RVEF 47% Aortic forward volume 109 mL Aortic regurgitant fraction 0% Global T1 1119, ECV 30% Global T2 58 (elevated) IMPRESSION: 1. Lung findings as above, patient had  recent CT chest with complete visualization of lung fields. 2.  Small pericardial effusion without tamponade. 3.  Mildly dilated LV with LV EF 51%, global hypokinesis. 4.  Moderately dilated RV with RV EF 47% (mildly decreased). 5.  Severe biatrial enlargement. 6. Nonspecific inferior RV insertion site LGE, this is nonspecific and can be seen with pressure/volume overload. Small area of <50% wall thickness subendocardial LGE at the basal inferolateral wall, possible coronary disease pattern. 7.  Borderline elevated extracellular volume percentage at 30%. 8.  Elevated global T2, can be seen with inflammation. Study is not highly suggestive of cardiac amyloidosis. Dalton Mclean Electronically Signed   By: Ezra Shuck M.D.   On: 03/26/2024 07:46   DG Chest Port 1 View Result Date: 03/26/2024 CLINICAL DATA:  Shortness of breath. EXAM: PORTABLE CHEST 1 VIEW COMPARISON:  03/24/2024 FINDINGS: Right apical pneumothorax seen on the previous study has become less prominent in the interval with no discrete pleural line visible on the current exam. Diffuse interstitial opacity persists in a similar fashion with right base collapse/consolidation/scarring in right pleural effusion, similar to prior. The cardio pericardial silhouette is enlarged. Telemetry leads overlie the chest. IMPRESSION: 1. Interval decrease in prominence of right apical pneumothorax. 2. No other significant interval change. Electronically Signed   By: Camellia Candle M.D.   On: 03/26/2024 06:18   EP STUDY Result Date: 03/24/2024 See surgical note for result.  DG Swallowing Func-Speech Pathology Result Date: 03/24/2024 Table formatting from the original result was not included. Modified Barium Swallow Study [name] MRN: [id number] Date of Birth: 09-Mar-1941 Today's Date: 03/24/2024 HPI/PMH: HPI: HAZIEL MOLNER is an 83 yo male presenting to ED 6/2 with worsening shortness of breath x3 days. Found to have A-fib RVR, acute on  chronic diastolic HF, recurrent R>L pleural effusion, and hypoxic respiratory failure. CT Chest shows large, multiloculated R hydropneumothorax  with chest tube in place as well as unchanged underlying pulmonary fibrosis but assessment limited by pleural effusions and atelectasis. MBS ordered per MD. PMH includes BPH, glaucoma, HTN, A-fib on anticoagulation with recent cardioversion x2 weeks PTA, history of SCC, unintentional weight loss Clinical Impression: Clinical Impression: Pt presents with a functional oropharyngeal swallow. No penetration/aspiration occurred throughout the study and there was no significant residue. The 13 mm barium tablet was given with thin liquids and was [facility], without noted esophageal retention or retrograde backflow. Education completed with pt and his daughter. Recommend he continue his current diet without ongoing SLP f/u. Will sign off at this time. Factors that may increase risk of adverse event in presence of aspiration Noe & Lianne 2021): Factors that may increase risk of adverse event in presence of aspiration Noe & Lianne 2021): Poor general health and/or compromised immunity; Respiratory or GI disease; Frail or deconditioned Recommendations/Plan: Swallowing Evaluation Recommendations Swallowing Evaluation Recommendations Recommendations: PO diet PO Diet Recommendation: Regular; Thin liquids (Level 0) Liquid Administration via: Cup; Straw Medication Administration: Whole meds with liquid Supervision: Patient able to self-feed Swallowing strategies  : Minimize environmental distractions; Slow rate; Small bites/sips Postural changes: Position pt fully upright for meals Oral care recommendations: Oral care BID (2x/day) Treatment Plan Treatment Plan Treatment recommendations: No treatment recommended at this time Follow-up recommendations: No SLP follow up Functional status assessment: Patient has not had a recent decline in their functional status. Recommendations  Recommendations for follow up therapy are one component of a multi-disciplinary discharge planning process, led by the attending physician.  Recommendations may be updated based on patient status, additional functional criteria and insurance authorization. Assessment: Orofacial Exam: Orofacial Exam Oral Cavity: Oral Hygiene: WFL Oral Cavity - Dentition: Adequate natural dentition Orofacial Anatomy: WFL Oral Motor/Sensory Function: WFL Anatomy: Anatomy: Suspected cervical osteophytes Boluses Administered: Boluses Administered Boluses Administered: Thin liquids (Level 0); Mildly thick liquids (Level 2, nectar thick); Moderately thick liquids (Level 3, honey thick); Puree; Solid  Oral Impairment Domain: Oral Impairment Domain Lip Closure: No labial escape Tongue control during bolus hold: Cohesive bolus between tongue to palatal seal Bolus preparation/mastication: Timely and efficient chewing and mashing Bolus transport/lingual motion: Brisk tongue motion Oral residue: Complete oral clearance Location of oral residue : N/A Initiation of pharyngeal swallow : Posterior angle of the ramus  Pharyngeal Impairment Domain: Pharyngeal Impairment Domain Soft palate elevation: No bolus between soft palate (SP)/pharyngeal wall (PW) Laryngeal elevation: Complete superior movement of thyroid  cartilage with complete approximation of arytenoids to epiglottic petiole Anterior hyoid excursion: Complete anterior movement Epiglottic movement: Complete inversion Laryngeal vestibule closure: Complete, no air/contrast in laryngeal vestibule Pharyngeal stripping wave : Present - complete Pharyngeal contraction (A/P view only): N/A Pharyngoesophageal segment opening: Complete distension and complete duration, no obstruction of flow Tongue base retraction: No contrast between tongue base and posterior pharyngeal wall (PPW) Pharyngeal residue: Complete pharyngeal clearance Location of pharyngeal residue: N/A  Esophageal Impairment Domain:  Esophageal Impairment Domain Esophageal clearance upright position: Complete clearance, esophageal coating Pill: Pill Consistency administered: Thin liquids (Level 0) Thin liquids (Level 0): [facility] Penetration/Aspiration Scale Score: Penetration/Aspiration Scale Score 1.  Material does not enter airway: Thin liquids (Level 0); Mildly thick liquids (Level 2, nectar thick); Puree; Pill; Solid; Moderately thick liquids (Level 3, honey thick) Compensatory Strategies: Compensatory Strategies Compensatory strategies: No   General Information: Caregiver present: Yes (daughter)  Diet Prior to this Study: Regular; Thin liquids (Level 0)   Temperature : Normal   Respiratory Status: [facility]   Supplemental  O2: Nasal cannula   History of Recent Intubation: No  Behavior/Cognition: Alert; Cooperative; Pleasant mood Self-Feeding Abilities: Able to self-feed Baseline vocal quality/speech: Normal Volitional Cough: Able to elicit Volitional Swallow: Able to elicit Exam Limitations: No limitations Goal Planning: Prognosis for improved oropharyngeal function: Good No data recorded No data recorded Patient/Family Stated Goal: want to get to root of illness Consulted and agree with results and recommendations: Patient; Family member/caregiver; Physician Pain: Pain Assessment Pain Assessment: Faces Faces Pain Scale: 0 Pain Intervention(s): Monitored during session End of Session: Start Time:SLP Start Time (ACUTE ONLY): 1002 Stop Time: SLP Stop Time (ACUTE ONLY): 1014 Time Calculation:SLP Time Calculation (min) (ACUTE ONLY): 12 min Charges: SLP Evaluations $ SLP Speech Visit: 1 Visit SLP Evaluations $MBS Swallow: 1 Procedure SLP visit diagnosis: SLP Visit Diagnosis: Dysphagia, unspecified (R13.10) Past Medical History: Past Medical History: Diagnosis Date  Arthritis   oa  BPH (benign prostatic hyperplasia)   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  Cataracts, bilateral   Glaucoma   Hypertension   Vitamin D  deficiency  Past Surgical  History: Past Surgical History: Procedure Laterality Date  CARDIOVERSION N/A 03/05/2024  Procedure: CARDIOVERSION;  Surgeon: Mona Vinie BROCKS, MD;  Location: [facility] INVASIVE CV LAB;  Service: Cardiovascular;  Laterality: N/A;  CHEST TUBE INSERTION Right 03/18/2024  Procedure: CHEST TUBE INSERTION;  Surgeon: Annella Donnice SAUNDERS, MD;  Location: [facility] ENDOSCOPY;  Service: Pulmonary;  Laterality: Right;  CHOLECYSTECTOMY    colonscopy  2017  IR THORACENTESIS ASP PLEURAL SPACE W/IMG GUIDE  03/03/2024  JOINT REPLACEMENT Right   hip   RIGHT HEART CATH N/A 03/23/2024  Procedure: RIGHT HEART CATH;  Surgeon: Rolan Ezra RAMAN, MD;  Location: [facility] INVASIVE CV LAB;  Service: Cardiovascular;  Laterality: N/A;  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  Procedure: LEFT TOTAL HIP ARTHROPLASTY ANTERIOR APPROACH;  Surgeon: Dempsey Moan, MD;  Location: [facility] ORS;  Service: Orthopedics;  Laterality: Left;  VENTRAL HERNIA REPAIR N/A 07/12/2023  Procedure: HERNIA REPAIR VENTRAL;  Surgeon: Lyndel Deward PARAS, MD;  Location: [facility] OR;  Service: General;  Laterality: N/A; Damien Blumenthal, M.A., CCC-SLP Speech Language Pathology, Acute Rehabilitation Services Secure Chat preferred [phone] 03/24/2024, 12:27 PM  DG Chest Port 1 View Result Date: 03/24/2024 CLINICAL DATA:  83 year old male with shortness of breath. Abnormal chest CT 3 days ago. EXAM: PORTABLE CHEST 1 VIEW COMPARISON:  Chest CT 03/21/2024 and earlier. FINDINGS: Portable AP semi upright view at 0607 hours. A right lung base pleural pigtail catheter has been removed since the recent CT. Right lung pneumothorax with irregular pleural edge is visible in the apex has not significantly changed since 03/20/2024. And otherwise the lung volumes and bilateral ventilation have not significantly changed from the recent CT. Stable cardiac size and mediastinal contours. Visualized tracheal air column is within normal limits. Stable visualized osseous structures.  Negative visible bowel gas. IMPRESSION: Right  pleural pigtail catheter removal, but otherwise no change in bilateral lung abnormality and ventilation from the CT on 03/21/2024 (please see that report). Unresolved right side pneumothorax has not significantly changed since 03/20/2024. Electronically Signed   By: VEAR Hurst M.D.   On: 03/24/2024 06:57   VAS US  LOWER EXTREMITY VENOUS (DVT) Result Date: 03/23/2024  Lower Venous DVT Study Patient Name:  Samuel Porter  Date of Exam:   03/23/2024 Medical Rec #: [id number]        Accession #:    [id number] Date of Birth: 1941-03-10         Patient  Gender: M Patient Age:   52 years Exam Location:  [facility] Procedure:      VAS US  LOWER EXTREMITY VENOUS (DVT) Referring Phys: TORIBIO SHARPS --------------------------------------------------------------------------------  Indications: Edema.  Risk Factors: Cancer. Comparison Study: No prior studies. Performing Technologist: Cordella Collet RVT  Examination Guidelines: A complete evaluation includes B-mode imaging, spectral Doppler, color Doppler, and power Doppler as needed of all accessible portions of each vessel. Bilateral testing is considered an integral part of a complete examination. Limited examinations for reoccurring indications may be performed as noted. The reflux portion of the exam is performed with the patient in reverse Trendelenburg.  +---------+---------------+---------+-----------+----------+--------------+ RIGHT    CompressibilityPhasicitySpontaneityPropertiesThrombus Aging +---------+---------------+---------+-----------+----------+--------------+ CFV      Full           Yes      Yes                                 +---------+---------------+---------+-----------+----------+--------------+ SFJ      Full                                                        +---------+---------------+---------+-----------+----------+--------------+ FV Prox  Full                                                         +---------+---------------+---------+-----------+----------+--------------+ FV Mid   Full                                                        +---------+---------------+---------+-----------+----------+--------------+ FV DistalFull                                                        +---------+---------------+---------+-----------+----------+--------------+ PFV      Full                                                        +---------+---------------+---------+-----------+----------+--------------+ POP      Full           Yes      Yes                                 +---------+---------------+---------+-----------+----------+--------------+ PTV      Full                                                        +---------+---------------+---------+-----------+----------+--------------+  PERO     Full                                                        +---------+---------------+---------+-----------+----------+--------------+   +---------+---------------+---------+-----------+----------+--------------+ LEFT     CompressibilityPhasicitySpontaneityPropertiesThrombus Aging +---------+---------------+---------+-----------+----------+--------------+ CFV      Full           Yes      Yes                                 +---------+---------------+---------+-----------+----------+--------------+ SFJ      Full                                                        +---------+---------------+---------+-----------+----------+--------------+ FV Prox  Full                                                        +---------+---------------+---------+-----------+----------+--------------+ FV Mid   Full                                                        +---------+---------------+---------+-----------+----------+--------------+ FV DistalFull                                                         +---------+---------------+---------+-----------+----------+--------------+ PFV      Full                                                        +---------+---------------+---------+-----------+----------+--------------+ POP      Full           Yes      Yes                                 +---------+---------------+---------+-----------+----------+--------------+ PTV      Full                                                        +---------+---------------+---------+-----------+----------+--------------+ PERO     Full                                                        +---------+---------------+---------+-----------+----------+--------------+  Summary: RIGHT: - There is no evidence of deep vein thrombosis in the lower extremity.  - No cystic structure found in the popliteal fossa.  LEFT: - There is no evidence of deep vein thrombosis in the lower extremity.  - No cystic structure found in the popliteal fossa.  *See table(s) above for measurements and observations. Electronically signed by Fonda Rim on 03/23/2024 at 4:40:08 PM.    Final    CARDIAC CATHETERIZATION Result Date: 03/23/2024 1. Normal left and right heart filling pressures. 2. Borderline elevated PA pressure. 3. Preserved cardiac output. We can stop IV Lasix , start po Lasix  tomorrow.   CT Chest High Resolution Result Date: 03/21/2024 CLINICAL DATA:  Interstitial lung disease, pleural effusion, trapped lung, query pleural metastases * Tracking Code: BO * EXAM: CT CHEST WITHOUT CONTRAST TECHNIQUE: Multidetector CT imaging of the chest was performed following the standard protocol without intravenous contrast. High resolution imaging of the lungs, as well as inspiratory and expiratory imaging, was performed. RADIATION DOSE REDUCTION: This exam was performed according to the departmental dose-optimization program which includes automated exposure control, adjustment of the mA and/or kV according to patient size and/or  use of iterative reconstruction technique. COMPARISON:  03/04/2024 FINDINGS: Cardiovascular: Aortic atherosclerosis. Cardiomegaly. Scattered left coronary artery calcifications. Enlargement of the main pulmonary artery measuring up to 3.5 cm in caliber. Similar small pericardial effusion. Mediastinum/Nodes: Unchanged prominent mediastinal and hilar lymph nodes. Thyroid  gland, trachea, and esophagus demonstrate no significant findings. Lungs/Pleura: Large, multiloculated right hydropneumothorax with a pigtail chest tube about the right lung base and a small pneumothorax component. 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 No significant air trapping on expiratory phase imaging. Small left pleural effusion, new compared to prior examination. Upper Abdomen: No acute abnormality. Musculoskeletal: No chest wall abnormality. No acute osseous findings. IMPRESSION: 1. Large, multiloculated right hydropneumothorax with a pigtail chest tube about the right lung base and a small pneumothorax component. 2. 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 3. Small left pleural effusion, new compared to prior examination. 4. Unchanged prominent mediastinal and hilar lymph nodes, likely reactive. 5.  Cardiomegaly and coronary artery disease. 6. Enlargement of the main pulmonary artery, as can be seen in pulmonary hypertension. Aortic Atherosclerosis (ICD10-I70.0). Electronically Signed   By: Marolyn JONETTA Jaksch M.D.   On: 03/21/2024 14:12   ECHOCARDIOGRAM LIMITED Result Date: 03/20/2024    ECHOCARDIOGRAM LIMITED REPORT   Patient Name:   Samuel Porter Date of Exam: 03/20/2024 Medical Rec #:  [id number]       Height:       73.0 in Accession #:    [id number]      Weight:       166.2 lb Date of Birth:  [birth year]        BSA:          1.989 m Patient Age:    82 years        BP:           129/77 mmHg  Patient Gender: M               HR:           122 bpm. Exam Location:  Inpatient Procedure: 2D Echo, Cardiac Doppler and Color Doppler (Both Spectral and Color            Flow Doppler were utilized during procedure). Indications:    CHF  History:        Patient has prior history of Echocardiogram examinations, most                 recent 12/29/2023. Pleural effusion.  Sonographer:    Benard Stallion Referring Phys: [id number] BRIDGETTE CHRISTOPHER IMPRESSIONS  1. Left ventricular ejection fraction, by estimation, is 30 to 35%. The left ventricle has moderately decreased function. The left ventricle demonstrates global hypokinesis. The left ventricular internal cavity size was mildly dilated. Left ventricular diastolic parameters are indeterminate.  2. Abnormal septal motion and septal flattening suggesting significant pulmnary HTN. Right ventricular systolic function is severely reduced. The right ventricular size is moderately enlarged. Moderately increased right ventricular wall thickness. There  is severely elevated pulmonary artery systolic pressure.  3. Left atrial size was moderately dilated.  4. Right atrial size was severely dilated.  5. The mitral valve is abnormal. Mild mitral valve regurgitation. No evidence of mitral stenosis.  6. Tricuspid valve regurgitation is moderate.  7. The aortic valve is tricuspid. Aortic  valve regurgitation is not visualized. No aortic stenosis is present.  8. The inferior vena cava is dilated in size with <50% respiratory variability, suggesting right atrial pressure of 15 mmHg. FINDINGS  Left Ventricle: Left ventricular ejection fraction, by estimation, is 30 to 35%. The left ventricle has moderately decreased function. The left ventricle demonstrates global hypokinesis. Strain was performed and the global longitudinal strain is indeterminate. The left ventricular internal cavity size was mildly dilated. There is no left ventricular hypertrophy. Left ventricular diastolic parameters are indeterminate. Right Ventricle: Abnormal septal motion and septal flattening suggesting significant pulmnary HTN. The right ventricular size is moderately enlarged. Moderately increased right ventricular wall thickness. Right ventricular systolic function is severely reduced. There is severely elevated pulmonary artery systolic pressure. The tricuspid regurgitant velocity is 3.73 m/s, and with an assumed right atrial pressure of 15 mmHg, the estimated right ventricular systolic pressure is 70.7 mmHg. Left Atrium: Left atrial size was moderately dilated. Right Atrium: Right atrial size was severely dilated. Pericardium: Trivial pericardial effusion is present. The pericardial effusion is posterior to the left ventricle. Mitral Valve: The mitral valve is abnormal. There is mild thickening of the mitral valve leaflet(s). Mild mitral valve regurgitation. No evidence of mitral valve stenosis. Tricuspid Valve: The tricuspid valve is normal in structure. Tricuspid valve regurgitation is moderate . No evidence of tricuspid stenosis. Aortic Valve: The aortic valve is tricuspid. Aortic valve regurgitation is not visualized. No aortic stenosis is present. Aortic valve mean gradient measures 2.0 mmHg. Aortic valve peak gradient measures 4.3 mmHg. Aortic valve area, by VTI measures 2.19 cm. Pulmonic Valve: The pulmonic valve  was normal in structure. Pulmonic valve regurgitation is not visualized. No evidence of pulmonic stenosis. Aorta: The aortic root is normal in size and structure. Venous: The inferior vena cava is dilated in size with less than 50% respiratory variability, suggesting right atrial pressure of 15 mmHg. IAS/Shunts: No atrial level shunt detected by color flow Doppler. Additional Comments: 3D was performed not requiring image post processing on an independent workstation and was indeterminate.  LEFT VENTRICLE PLAX 2D LVIDd:         4.40 cm   Diastology LVIDs:         3.70 cm   LV e' medial:    3.48 cm/s LV PW:         0.90 cm   LV E/e' medial:  27.7 LV IVS:        0.90 cm   LV e' lateral:   10.60 cm/s LVOT diam:     2.10 cm   LV E/e' lateral: 9.1 LV SV:         37 LV SV Index:   18 LVOT Area:     3.46 cm  RIGHT VENTRICLE RV Basal diam:  4.80 cm RV Mid diam:    4.00 cm RV S prime:     9.03 cm/s TAPSE (M-mode): 0.9 cm LEFT ATRIUM             Index        RIGHT ATRIUM           Index LA diam:        4.40 cm 2.21 cm/m   RA Area:     34.70 cm LA Vol (A2C):   93.5 ml 47.01 ml/m  RA Volume:   134.00 ml 67.37 ml/m LA Vol (A4C):   58.9 ml 29.61 ml/m LA Biplane Vol: 75.1 ml 37.76 ml/m  AORTIC VALVE AV Area (Vmax):    2.47 cm AV Area (Vmean):   2.32 cm AV Area (VTI):     2.19 cm AV Vmax:           104.00 cm/s AV Vmean:          68.400 cm/s AV VTI:            0.168 m AV Peak Grad:      4.3 mmHg AV Mean Grad:      2.0 mmHg LVOT Vmax:         74.10 cm/s LVOT Vmean:        45.800 cm/s LVOT VTI:          0.106 m LVOT/AV VTI ratio: 0.63  AORTA Ao Asc diam: 3.60 cm MITRAL VALVE               TRICUSPID VALVE MV Area (PHT): 5.54 cm    TR Peak grad:   55.7 mmHg MV Decel Time: 137 msec    TR Vmax:        373.00 cm/s MR Peak grad: 34.3 mmHg MR Vmax:      293.00 cm/s  SHUNTS MV E velocity: 96.40 cm/s  Systemic VTI:  0.11 m MV A velocity: 30.00 cm/s  Systemic Diam: 2.10 cm MV E/A ratio:  3.21 Maude Emmer MD Electronically signed by  Maude Emmer MD Signature Date/Time: 03/20/2024/3:59:37 PM    Final    DG CHEST PORT 1 VIEW Result Date: 03/20/2024 CLINICAL DATA:  Chest tube in place. EXAM: PORTABLE CHEST 1 VIEW COMPARISON:  March 19, 2024. FINDINGS: Stable cardiomediastinal silhouette. Mild central pulmonary vascular congestion is noted with possible bilateral pulmonary edema. Stable right-sided chest tube with stable small right hydropneumothorax. Bony thorax is unremarkable. IMPRESSION: Stable right-sided chest tube with stable small right hydropneumothorax. Mild central pulmonary vascular congestion with possible bilateral pulmonary edema. Electronically Signed   By: Lynwood Landy Raddle M.D.   On: 03/20/2024 08:22   DG CHEST PORT 1 VIEW Result Date: 03/19/2024 CLINICAL DATA:  [id number] Chest tube in place [id number]. EXAM: PORTABLE  CHEST 1 VIEW COMPARISON:  03/19/2024, 6:31 a.m. FINDINGS: There is small residual right pneumothorax, decreased since the recent prior study, status post presumed pleural drainage catheter exchange. Redemonstration of chronic interstitial and alveolar opacities throughout bilateral lungs without significant interval change. No left pneumothorax. There is blunting of bilateral lateral costophrenic angles, concerning for underlying bilateral trace pleural effusions. Stable cardio-mediastinal silhouette. No acute osseous abnormalities. The soft tissues are within normal limits. IMPRESSION: 1. Small residual right pneumothorax, decreased since the recent prior study, status post presumed pleural drainage catheter exchange. 2. Redemonstration of chronic interstitial and alveolar opacities throughout bilateral lungs without significant interval change. Electronically Signed   By: Ree Molt M.D.   On: 03/19/2024 11:36   DG Chest Port 1 View Result Date: 03/19/2024 CLINICAL DATA:  Shortness of breath EXAM: PORTABLE CHEST 1 VIEW COMPARISON:  Chest x-ray performed March 18, 2024 FINDINGS: A right basilar pneumothorax is present  which extends along the periphery into the right apex. The basilar component has increased. The right basilar pigtail catheter is possibly kinked. Interstitial airspace opacities are present in both lungs, similar. Heart remains enlarged. IMPRESSION: 1. The right basilar pigtail catheter is possibly kinked. 2. Modest worsening of right basilar component of pneumothorax. These results will be called to the ordering clinician or representative by the Radiologist Assistant, and communication documented in the PACS or [employer]. Electronically Signed   By: Maude Naegeli M.D.   On: 03/19/2024 06:57   DG Chest Port 1 View Result Date: 03/18/2024 CLINICAL DATA:  Right chest tube placement for a large right pleural effusion. EXAM: PORTABLE CHEST 1 VIEW COMPARISON:  03/17/2024 FINDINGS: Interval pigtail pleural catheter at the right lung base with resolution of the previously demonstrated right pleural effusion. This has been replaced with an approximately 35% right pneumothorax without mediastinal shift. The cardiac silhouette is borderline enlarged. Patchy density is again demonstrated at the left lung base there is some patchy and linear density in the partially re-expanded right lung. Stable mild chronic interstitial prominence. Thoracic spine degenerative changes. IMPRESSION: 1. Approximately 35% right pneumothorax without mediastinal shift. 2. Interval pigtail pleural catheter placement at the right lung base with resolution of the previously demonstrated right pleural effusion. 3. Patchy and linear density in the partially re-expanded right lung, likely due to atelectasis with possible pneumonia/re-expansion pulmonary edema. 4. Stable patchy density at the left lung base suspicious for pneumonia. These results will be called to the ordering clinician or representative by the Radiologist Assistant, and communication documented in the PACS or [employer]. Electronically Signed   By: Elspeth Bathe M.D.   On:  03/18/2024 15:01   US  RENAL Result Date: 03/17/2024 CLINICAL DATA:  Acute kidney injury EXAM: RENAL / URINARY TRACT ULTRASOUND COMPLETE COMPARISON:  CT 07/11/2023 FINDINGS: Right Kidney: Renal measurements: 11.1 x 3.9 x 5.3 cm = volume: 120 mL. Echogenicity within normal limits. No mass or hydronephrosis visualized. Left Kidney: Renal measurements: 12.4 x 5.2 x 5.3 cm = volume: 179 mL. Echogenicity within normal limits. No mass or hydronephrosis visualized. Bladder: Soft tissue protruding along the inferior bladder from the region of the prostate. Is difficult to determine if this is related to the prostate or bladder wall. Other: Prostate enlargement. IMPRESSION: No hydronephrosis. Prostate enlargement. Soft tissue nodular area projecting along the inferior bladder measuring up to 2.1 cm, difficult to determine if this is related to the prostate or bladder wall. When compared to prior CT, I favor this is related to enlarged prostate. This could  be further evaluated with CT with contrast with delayed images through the bladder if felt clinically indicated. Electronically Signed   By: Franky Crease M.D.   On: 03/17/2024 14:37   DG Chest 2 View Result Date: 03/17/2024 CLINICAL DATA:  Dyspnea EXAM: CHEST - 2 VIEW COMPARISON:  March 16, 2024 FINDINGS: Persistent large right pleural effusion with right lower lobe atelectasis Persistent bilateral pulmonary infiltrates that could correlate with congestive changes Heart normal size IMPRESSION: No change compared with prior Electronically Signed   By: Franky Chard M.D.   On: 03/17/2024 08:39   DG Chest 1 View Result Date: 03/16/2024 CLINICAL DATA:  History of right pleural effusion EXAM: CHEST  1 VIEW COMPARISON:  Chest radiograph dated 03/03/2024 FINDINGS: Dense right lower lung opacity. Patchy left lower lung opacity. Increased large right pleural effusion. No pneumothorax. Right heart border is obscured. No acute osseous abnormality. Right axillary surgical clips.  IMPRESSION: 1. Increased large right pleural effusion with dense right lower lung opacity, likely atelectasis. 2. Patchy left lower lung opacity, likely atelectasis. Electronically Signed   By: Limin  Xu M.D.   On: 03/16/2024 11:30    All questions were answered. The patient knows to call the clinic with any problems, questions or concerns. I spent 20 minutes in the care of this patient including H and P, review of records, counseling and coordination of care.  I connected with  Samuel Porter on 04/09/24 by a telephone application and verified that I am speaking with the correct person using two identifiers.   I discussed the limitations of evaluation and management by telemedicine. The patient expressed understanding and agreed to proceed.  Location of pt: Home Location of provider: office.   Amber Stalls, MD 04/09/2024 10:32 AM

## 2024-04-09 NOTE — Progress Notes (Signed)
 Samuel Porter    [id number]    02-16-1941  Primary Care Physician:Morrow, Beverley, MD  Referring Physician: Kip Beverley, MD [address] [place],  [place] [address]  Chief complaint: Follow-up for recurrent pleural effusion, failure to thrive, malnutrition  HPI: 83 y.o. who  has a past medical history of Arthritis, BPH (benign prostatic hyperplasia), Cancer (HCC) (7 yrs ago), Cataracts, bilateral, Glaucoma, Hypertension, and Vitamin D  deficiency.  Discussed the use of AI scribe software for clinical note transcription with the patient, who gave verbal consent to proceed.  History of Present Illness Samuel Porter is an 83 y.o.  male with recurrent skin cancer who presents with pleural effusion.  He has a right-sided pleural effusion, initially identified during a follow-up for recurrent skin cancer. A thoracentesis on February 06, 2024, drained 2.5 liters of fluid, resulting in symptomatic improvement. He is uncertain if the fluid is reaccumulating. Symptoms began with cough and shortness of breath in late January 2025, progressing to pneumonia and atrial fibrillation in March 2025. He was treated with a Z-Pak for pneumonia.  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 A neck dissection and lymph node biopsy in 2024 were negative for malignancy. He completed neoadjuvant Cemiplimab   treatment in July 2024 and is not currently on chemotherapy or experimental medications.  He was diagnosed with atrial fibrillation in March 2025, initially presenting with a heart rate of 140 bpm. Although scheduled for cardioversion, it was not performed. He is followed by an AFib clinic and is scheduled for another cardioversion on Mar 05, 2024. He started on amiodarone  approximately six weeks ago and is on Eliquis .  Recently, he was prescribed Farxiga  10 mg and spironolactone , though he has not yet started these  medications.  Interim History: Samuel Porter is an 83 year old male with recurrent pleural effusion and atrial fibrillation who presents for follow-up of his pulmonary condition. He has been experiencing recurrent pleural effusions, primarily on the right side.  He underwent thoracentesis on the outpatient on 03/03/2024 however with rapid reaccumulation of fluid and was hospitalized in early June 2025 for respiratory failure, atrial fibrillation.  Had a right chest tube placed with findings of an trapped lung with hydropneumothorax.  Chest tube was eventually removed.  He had a right heart catheterization during this admission with findings of normal left and right heart filling pressure, borderline elevated PA pressures with preserved cardiac output.  Also noted to have severe malnutrition, failure to thrive  He has seen Dr. Loretha, oncologist to evaluate for myelodysplasia and amyloidosis, with a bone marrow biopsy planned for the future. His daughter expressed concerns about the timing of further invasive testing. He has not been eating well, which may contribute to his current condition.  He is currently using oxygen and is interested in resuming activities such as water  aerobics but is unsure if he can participate while using oxygen. He feels 'pretty good' overall and is concerned about his weight and fluid retention.  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  Exposures: No mold, hot tub, Jacuzzi.  No feather pillows or comforters.  On amiodarone  as noted above No h/o chemo/XRT/macrodantin/MTX  No exposure to asbestos, silica or other organic allergens  Smoking history: He has a significant smoking history, 15-pack-year smoker having quit in 1970.  Travel history:He has lived in [place]  since 1980.  Originally from [place] .  Relevant family history: No family history of lung disease   Outpatient Encounter Medications as of 04/09/2024   Medication Sig   acetaminophen  (TYLENOL ) 325 MG tablet Take 650 mg by mouth daily as needed for moderate pain (pain score 4-6) or mild pain (pain score 1-3).   albuterol  (VENTOLIN  HFA) 108 (90 Base) MCG/ACT inhaler Inhale 2 puffs into the lungs every 4 (four) hours as needed for shortness of breath.   amiodarone  (PACERONE ) 200 MG tablet Take 1 tablet (200 mg total) by mouth 2 (two) times daily.   cholecalciferol  (VITAMIN D ) 1000 units tablet Take 1,000 Units by mouth daily.   dapagliflozin  propanediol (FARXIGA ) 10 MG TABS tablet Take 1 tablet (10 mg total) by mouth daily before breakfast.   docusate sodium  (COLACE) 100 MG capsule Take 1 capsule (100 mg total) by mouth 2 (two) times daily as needed for mild constipation.   dorzolamide -timolol  (COSOPT ) 22.3-6.8 MG/ML ophthalmic solution Place 1 drop into both eyes 2 (two) times daily.   ELIQUIS  5 MG TABS tablet Take 5 mg by mouth 2 (two) times daily.   feeding supplement (ENSURE PLUS HIGH PROTEIN) LIQD Take 237 mLs by mouth 3 (three) times daily between meals.   finasteride  (PROSCAR ) 5 MG tablet Take 5 mg by mouth daily.   furosemide  (LASIX ) 20 MG tablet Take 1 tablet (20 mg total) by mouth daily.   levothyroxine  (SYNTHROID ) 50 MCG tablet Take 1 tablet (50 mcg total) by mouth daily at 6 (six) AM.   mirtazapine  (REMERON ) 7.5 MG tablet Take 1 tablet (7.5 mg total) by mouth at bedtime.   Multiple Vitamin (MULTIVITAMIN WITH MINERALS) TABS tablet Take 1 tablet by mouth daily.   Nutritional Supplements (,FEEDING SUPPLEMENT, PROSOURCE PLUS) liquid Take 30 mLs by mouth 2 (two) times daily before lunch and supper.   polyethylene glycol (MIRALAX  / GLYCOLAX ) 17 g packet Take 17 g by mouth 2 (two) times daily as needed.   No facility-administered encounter medications on file as of 04/09/2024.   Physical Exam: Blood pressure 134/80, pulse 89, height 6' 1 (1.854 m), weight 159 lb 9.6 oz (72.4 kg), SpO2 100%. Gen:      No acute distress frail,  cachectic HEENT:  EOMI, sclera anicteric Neck:     No masses; no thyromegaly Lungs:    Clear to auscultation bilaterally; normal respiratory effort CV:         Regular rate and rhythm; no murmurs Abd:      + bowel sounds; soft, non-tender; no palpable masses, no distension Ext:    No edema; adequate peripheral perfusion Neuro: alert and oriented x 3 Psych: normal mood and affect   Data Reviewed: Imaging: PET scan 11/27/2022 Small pulmonary nodules without PET uptake patchy areas of groundglass, septal thickening throughout the lungs.  No pleural effusion. I have reviewed the images personally.  CT chest 02/04/24 [[facility]] New large volume right pleural effusion with associated complete right middle and right lower lobe as well as partial right upper lobe passive atelectasis with associated leftward shift of the heart and mediastinum.  Overall similar appearance of nonspecific diffuse lung disease with mild mosaic attenuation pattern of groundglass opacification with associated fine reticulonodular opacities.  While a 1.0 cm short axis borderline enlarged subaortic lymph node is unchanged, other previously present lymph nodes are not as well visualized compared to the prior study.   CT hydration 03/21/2024-large multiloculated right hydropneumothorax, mild pattern of underlying pulmonary fibrosis, alternate pattern  PFTs:   Labs:  Pleural fluid studies  [[facility]] 02/06/2024 Cell count 494 cells, 53% lymphocytes, 43% monocyte macrophage glucose 113 Cholesterol 51, albumin  1.8, protein 3.6, LDH 95 No malignant cells identified.  Lymphocytes and few monocyte macrophage cells present Fluid culture negative.  03/03/2024 Cell count 1188, 88% lymphocytes.  LDH 136, total protein 3.6.  Negative cultures, cytology  03/18/2024 Cell count 1319, 91% lymphs, LDH 131, total protein 3.2.  Negative cytology  Cardiac: Echocardiogram 03/20/2024-LVEF 30-35%, significant pulmonary hypertension, RV systolic function  is severely reduced  Right heart catheterization 03/23/2024- 1. Normal left and right heart filling pressures.  2. Borderline elevated PA pressure.  3. Preserved cardiac output.  Assessment & Plan Pleural effusion Recurrent right-sided pleural effusion, and trapped lung with unclear etiology. Differential includes heart failure and malnutrition.  Appears to be borderline exudative with lymphocyte predominance which is unusual.  Cytology x 3 is negative for malignancy and cultures have been negative as well. Recent chest x-ray shows stable effusion size. Previous drainage procedures have been performed, but further drainage is avoided to prevent protein loss and worsening malnutrition. - Monitor with follow-up x-ray in three months.  - Consider intermittent thoracentesis of Pleurx if effusion recurs however would be reluctant to place a Pleurx catheter as he is already dealing with chronic malnutrition and failure to thrive. - Will need to address goals of care if this is a recurrent issue.  Interstitial lung disease May have mild underlying unspecified interstitial lung disease.  Not a candidate for any invasive testing such as bronchoscopy or biopsy Continue monitoring.  Acute and chronic heart failure Atrial fibrillation Heart failure is contributing to pleural effusion. Recent hospitalization for acute exacerbation managed with cardioversion. Current management focuses on monitoring and nutritional support to prevent fluid overload. Atrial fibrillation was managed with cardioversion during hospitalization. The condition is being monitored in conjunction with heart failure management.  Malnutrition Malnutrition is a concern, potentially contributing to pleural effusion through third spacing of fluid. High protein diet has been initiated to address nutritional deficiencies. Emphasis on nutritional optimization to support overall health and prevent further complications. - Continue high protein  diet - Encourage nutritional optimization with protein shakes  Amyloidosis Discussed the potential impact of amyloidosis on heart and lung function. Amyloidosis can cause heart dysfunction and lung inflammation, though it is not typically associated with pleural effusion.  Oncology evaluation with possible bone marrow biopsy is pending  Oxygen therapy Oxygen therapy is currently required following recent hospitalization. - Reassess oxygen needs in three months with repeat x-ray and evaluation of oxygen levels  Squamous cell carcinoma of skin Recurrent squamous cell carcinoma primarily in facial and neck areas. Previously treated with PD-1 inhibitor cemiplimab , with negative lymph node biopsy post-treatment. No current chemotherapy or immunotherapy ongoing.  Recommendations: Follow-up in 3 months with chest x-ray  Lonna Coder MD [facility] 04/09/2024, 12:18 PM  CC: Samuel Righter, MD

## 2024-04-13 ENCOUNTER — Encounter (HOSPITAL_COMMUNITY): Payer: Self-pay

## 2024-04-13 ENCOUNTER — Ambulatory Visit (HOSPITAL_COMMUNITY)
Admission: RE | Admit: 2024-04-13 | Discharge: 2024-04-13 | Disposition: A | Discharge status: Home / Self Care | Source: Ambulatory Visit | Attending: Physician Assistant | Admitting: Physician Assistant

## 2024-04-13 ENCOUNTER — Ambulatory Visit (HOSPITAL_COMMUNITY): Admitting: Physician Assistant

## 2024-04-13 VITALS — BP 132/82 | HR 92 | Ht 73.0 in | Wt 160.2 lb

## 2024-04-13 DIAGNOSIS — D6869 Other thrombophilia: Secondary | ICD-10-CM

## 2024-04-13 DIAGNOSIS — Z5181 Encounter for therapeutic drug level monitoring: Secondary | ICD-10-CM | POA: Diagnosis not present

## 2024-04-13 DIAGNOSIS — I4819 Other persistent atrial fibrillation: Secondary | ICD-10-CM | POA: Diagnosis not present

## 2024-04-13 DIAGNOSIS — I4891 Unspecified atrial fibrillation: Secondary | ICD-10-CM

## 2024-04-13 DIAGNOSIS — Z79899 Other long term (current) drug therapy: Secondary | ICD-10-CM | POA: Diagnosis not present

## 2024-04-13 LAB — BASIC METABOLIC PANEL WITH GFR
BUN/Creatinine Ratio: 26 — ABNORMAL HIGH (ref 10–24)
BUN: 27 mg/dL (ref 8–27)
CO2: 32 mmol/L — ABNORMAL HIGH (ref 20–29)
Calcium: 9 mg/dL (ref 8.6–10.2)
Chloride: 100 mmol/L (ref 96–106)
Creatinine, Ser: 1.02 mg/dL (ref 0.76–1.27)
Glucose: 98 mg/dL (ref 70–99)
Potassium: 5.1 mmol/L (ref 3.5–5.2)
Sodium: 139 mmol/L (ref 134–144)
eGFR: 73 mL/min/{1.73_m2} (ref >59)

## 2024-04-13 LAB — CBC
Hematocrit: 37.8 % (ref 37.5–51.0)
Hemoglobin: 12 g/dL — ABNORMAL LOW (ref 13.0–17.7)
MCH: 28.7 pg (ref 26.6–33.0)
MCHC: 31.7 g/dL (ref 31.5–35.7)
MCV: 90 fL (ref 79–97)
Platelets: 191 10*3/uL (ref 150–450)
RBC: 4.18 x10E6/uL (ref 4.14–5.80)
RDW: 14.2 % (ref 11.6–15.4)
WBC: 6 10*3/uL (ref 3.4–10.8)

## 2024-04-13 NOTE — H&P (View-Only) (Signed)
 Primary Care Physician: Kip Righter, MD Primary Cardiologist: Ozell Fell, MD Electrophysiologist: Will Gladis Norton, MD  [facility]: Dr Zenaida Referring Physician: Dr Fell [name] is a 83 y.o. 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  The patient was initially diagnosed with atrial fibrillation 11/22/23. Patient was started on Eliquis  for stroke prevention. He was sent for an echocardiogram on 12/19/23 and presented in afib with RVR. Patient was unaware of his arrhythmia. He was seen by Dr Fell and was started on carvedilol . Echo showed EF 35-40%, severe LAE, no significant valvular issues. He was set up for DCCV on 3/12 but presented in SR.   Patient returns for follow up for atrial fibrillation and amiodarone  monitoring. Patient was discharged 03/27/24 after an extended hospitalization for acute respiratory failure due to acute CHF and recurrent right pleural effusion. Seen by pulmonary and felt to have underlying ILD as well. He underwent DCCV on 03/24/24. He was back in afib about one week post discharge. His amiodarone  was increased to BID. Patient reports that he feels well today and is unaware of his arrhythmia. He is now on O2 nasal canula.   Today, he  denies symptoms of palpitations, chest pain, orthopnea, PND, lower extremity edema, dizziness, presyncope, syncope, snoring, daytime somnolence, bleeding, or neurologic sequela. The patient is tolerating medications without difficulties and is otherwise without complaint today.    Atrial Fibrillation Risk Factors:  he does not have symptoms or diagnosis of sleep apnea. he does not have a history of rheumatic fever. he does have a history of alcohol use.  The patient does have a history of early familial atrial fibrillation or other arrhythmias. Mother had afib.   Atrial  Fibrillation Management history:  Previous antiarrhythmic drugs: amiodarone   Previous cardioversions: 03/05/24, 03/24/24 Previous ablations: none Anticoagulation history: Eliquis   ROS- All systems are reviewed and negative except as per the HPI above.  Past Medical History:  Diagnosis Date   Arthritis    oa   BPH (benign prostatic hyperplasia)    Cancer (HCC) 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   Cataracts, bilateral    Glaucoma    Hypertension    Vitamin D  deficiency     Current Outpatient Medications  Medication Sig Dispense Refill   acetaminophen  (TYLENOL ) 325 MG tablet Take 650 mg by mouth daily as needed for moderate pain (pain score 4-6) or mild pain (pain score 1-3). (Patient taking differently: Take 650 mg by mouth as needed for moderate pain (pain score 4-6) or mild pain (pain score 1-3).)     albuterol  (VENTOLIN  HFA) 108 (90 Base) MCG/ACT inhaler Inhale 2 puffs into the lungs every 4 (four) hours as needed for shortness of breath.     amiodarone  (PACERONE ) 200 MG tablet Take 1 tablet (200 mg total) by mouth 2 (two) times daily. 60 tablet 3   cholecalciferol  (VITAMIN D ) 1000 units tablet Take 1,000 Units by mouth daily.     dapagliflozin  propanediol (FARXIGA ) 10 MG TABS tablet Take 1 tablet (10 mg total) by mouth daily before breakfast. 30 tablet 11   docusate sodium  (COLACE) 100 MG capsule Take 1 capsule (100 mg total) by mouth 2 (two) times daily as needed for mild constipation.     dorzolamide -timolol  (COSOPT ) 22.3-6.8 MG/ML ophthalmic solution Place 1 drop into both eyes 2 (two) times daily.  ELIQUIS  5 MG TABS tablet Take 5 mg by mouth 2 (two) times daily.     feeding supplement (ENSURE PLUS HIGH PROTEIN) LIQD Take 237 mLs by mouth 3 (three) times daily between meals.     finasteride  (PROSCAR ) 5 MG tablet Take 5 mg by mouth daily.     furosemide  (LASIX ) 20 MG tablet Take 1 tablet (20 mg total) by mouth daily. 90 tablet 3   levothyroxine  (SYNTHROID ) 50 MCG tablet  Take 1 tablet (50 mcg total) by mouth daily at 6 (six) AM. 30 tablet 0   mirtazapine  (REMERON ) 7.5 MG tablet Take 1 tablet (7.5 mg total) by mouth at bedtime. 30 tablet 0   Multiple Vitamin (MULTIVITAMIN WITH MINERALS) TABS tablet Take 1 tablet by mouth daily. 30 tablet 0   Nutritional Supplements (,FEEDING SUPPLEMENT, PROSOURCE PLUS) liquid Take 30 mLs by mouth 2 (two) times daily before lunch and supper. 887 mL 0   polyethylene glycol (MIRALAX  / GLYCOLAX ) 17 g packet Take 17 g by mouth 2 (two) times daily as needed.     sildenafil (VIAGRA) 25 MG tablet 1 tablet as needed Orally as needed     No current facility-administered medications for this encounter.    Physical Exam: BP 132/82   Pulse 92   Ht 6' 1 (1.854 m)   Wt 72.7 kg   BMI 21.14 kg/m   GEN: Well nourished, well developed in no acute distress NECK: No JVD; No carotid bruits CARDIAC: Irregularly irregular rate and rhythm, no murmurs, rubs, gallops RESPIRATORY:  Clear to auscultation without rales, wheezing or rhonchi, O2 nasal canula ABDOMEN: Soft, non-tender, non-distended EXTREMITIES:  No edema; No deformity    Wt Readings from Last 3 Encounters:  04/13/24 72.7 kg  04/09/24 72.4 kg  04/06/24 73 kg     EKG today demonstrates  Afib, PVC triplet vs aberrantly conducted beats Vent. rate 92 BPM PR interval * ms QRS duration 96 ms QT/QTcB 358/442 ms   Echo 03/20/24 demonstrated   1. Left ventricular ejection fraction, by estimation, is 30 to 35%. The  left ventricle has moderately decreased function. The left ventricle  demonstrates global hypokinesis. The left ventricular internal cavity size  was mildly dilated. Left ventricular diastolic parameters are indeterminate.   2. Abnormal septal motion and septal flattening suggesting significant  pulmnary HTN. Right ventricular systolic function is severely reduced. The  right ventricular size is moderately enlarged. Moderately increased right  ventricular wall  thickness. There is severely elevated pulmonary artery systolic pressure.   3. Left atrial size was moderately dilated.   4. Right atrial size was severely dilated.   5. The mitral valve is abnormal. Mild mitral valve regurgitation. No  evidence of mitral stenosis.   6. Tricuspid valve regurgitation is moderate.   7. The aortic valve is tricuspid. Aortic valve regurgitation is not  visualized. No aortic stenosis is present.   8. The inferior vena cava is dilated in size with <50% respiratory  variability, suggesting right atrial pressure of 15 mmHg.    CHA2DS2-VASc Score = 4  The patient's score is based upon: CHF History: 1 HTN History: 1 Diabetes History: 0 Stroke History: 0 Vascular Disease History: 0 Age Score: 2 Gender Score: 0       ASSESSMENT AND PLAN: Persistent Atrial Fibrillation (ICD10:  I48.19) The patient's CHA2DS2-VASc score is 4, indicating a 4.8% annual risk of stroke.   Patient back in rate controlled afib. We discussed rhythm control options today. Would like to keep him  in SR given his reduced EF and recent admission for CHF. Will arrange for repeat DCCV.  Continue higher dose of amiodarone  200 mg BID for now. Could consider changing to dofetilide but this would require a 3 month washout of amiodarone . Unlikely ablation candidate given his other comorbidities. Will have him follow up with EP post DCCV.  Continue Eliquis  5 mg BID (he did miss a dose on 7/18 or 7/19, will schedule DCCV at least 3 weeks from that time).  Check bmet/cbc  Secondary Hypercoagulable State (ICD10:  D68.69) The patient is at significant risk for stroke/thromboembolism based upon his CHA2DS2-VASc Score of 4.  Continue Apixaban  (Eliquis ).   High Risk Medication Monitoring (ICD 10: Z79.899) Intervals on ECG acceptable for amiodarone  monitoring.   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   Informed Consent   Shared Decision Making/Informed Consent The risks (stroke, cardiac arrhythmias rarely resulting in the need for a temporary or permanent pacemaker, skin irritation or burns and complications associated with conscious sedation including aspiration, arrhythmia, respiratory failure and death), benefits (restoration of normal sinus rhythm) and alternatives of a direct current cardioversion were explained in detail to Samuel Porter and he agrees to proceed.           Daril Kicks PA-C Afib Clinic [facility] [address] [place], [place] [address] [phone]

## 2024-04-13 NOTE — Progress Notes (Signed)
 Primary Care Physician: Kip Righter, MD Primary Cardiologist: Ozell Fell, MD Electrophysiologist: Will Gladis Norton, MD  [facility]: Dr Zenaida Referring Physician: Dr Fell Norleen JINNY Rudell is a 83 y.o. 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  The patient was initially diagnosed with atrial fibrillation 11/22/23. Patient was started on Eliquis  for stroke prevention. He was sent for an echocardiogram on 12/19/23 and presented in afib with RVR. Patient was unaware of his arrhythmia. He was seen by Dr Fell and was started on carvedilol . Echo showed EF 35-40%, severe LAE, no significant valvular issues. He was set up for DCCV on 3/12 but presented in SR.   Patient returns for follow up for atrial fibrillation and amiodarone  monitoring. Patient was discharged 03/27/24 after an extended hospitalization for acute respiratory failure due to acute CHF and recurrent right pleural effusion. Seen by pulmonary and felt to have underlying ILD as well. He underwent DCCV on 03/24/24. He was back in afib about one week post discharge. His amiodarone  was increased to BID. Patient reports that he feels well today and is unaware of his arrhythmia. He is now on O2 nasal canula.   Today, he  denies symptoms of palpitations, chest pain, orthopnea, PND, lower extremity edema, dizziness, presyncope, syncope, snoring, daytime somnolence, bleeding, or neurologic sequela. The patient is tolerating medications without difficulties and is otherwise without complaint today.    Atrial Fibrillation Risk Factors:  he does not have symptoms or diagnosis of sleep apnea. he does not have a history of rheumatic fever. he does have a history of alcohol use.  The patient does have a history of early familial atrial fibrillation or other arrhythmias. Mother had afib.   Atrial  Fibrillation Management history:  Previous antiarrhythmic drugs: amiodarone   Previous cardioversions: 03/05/24, 03/24/24 Previous ablations: none Anticoagulation history: Eliquis   ROS- All systems are reviewed and negative except as per the HPI above.  Past Medical History:  Diagnosis Date   Arthritis    oa   BPH (benign prostatic hyperplasia)    Cancer (HCC) 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   Cataracts, bilateral    Glaucoma    Hypertension    Vitamin D  deficiency     Current Outpatient Medications  Medication Sig Dispense Refill   acetaminophen  (TYLENOL ) 325 MG tablet Take 650 mg by mouth daily as needed for moderate pain (pain score 4-6) or mild pain (pain score 1-3). (Patient taking differently: Take 650 mg by mouth as needed for moderate pain (pain score 4-6) or mild pain (pain score 1-3).)     albuterol  (VENTOLIN  HFA) 108 (90 Base) MCG/ACT inhaler Inhale 2 puffs into the lungs every 4 (four) hours as needed for shortness of breath.     amiodarone  (PACERONE ) 200 MG tablet Take 1 tablet (200 mg total) by mouth 2 (two) times daily. 60 tablet 3   cholecalciferol  (VITAMIN D ) 1000 units tablet Take 1,000 Units by mouth daily.     dapagliflozin  propanediol (FARXIGA ) 10 MG TABS tablet Take 1 tablet (10 mg total) by mouth daily before breakfast. 30 tablet 11   docusate sodium  (COLACE) 100 MG capsule Take 1 capsule (100 mg total) by mouth 2 (two) times daily as needed for mild constipation.     dorzolamide -timolol  (COSOPT ) 22.3-6.8 MG/ML ophthalmic solution Place 1 drop into both eyes 2 (two) times daily.  ELIQUIS  5 MG TABS tablet Take 5 mg by mouth 2 (two) times daily.     feeding supplement (ENSURE PLUS HIGH PROTEIN) LIQD Take 237 mLs by mouth 3 (three) times daily between meals.     finasteride  (PROSCAR ) 5 MG tablet Take 5 mg by mouth daily.     furosemide  (LASIX ) 20 MG tablet Take 1 tablet (20 mg total) by mouth daily. 90 tablet 3   levothyroxine  (SYNTHROID ) 50 MCG tablet  Take 1 tablet (50 mcg total) by mouth daily at 6 (six) AM. 30 tablet 0   mirtazapine  (REMERON ) 7.5 MG tablet Take 1 tablet (7.5 mg total) by mouth at bedtime. 30 tablet 0   Multiple Vitamin (MULTIVITAMIN WITH MINERALS) TABS tablet Take 1 tablet by mouth daily. 30 tablet 0   Nutritional Supplements (,FEEDING SUPPLEMENT, PROSOURCE PLUS) liquid Take 30 mLs by mouth 2 (two) times daily before lunch and supper. 887 mL 0   polyethylene glycol (MIRALAX  / GLYCOLAX ) 17 g packet Take 17 g by mouth 2 (two) times daily as needed.     sildenafil (VIAGRA) 25 MG tablet 1 tablet as needed Orally as needed     No current facility-administered medications for this encounter.    Physical Exam: BP 132/82   Pulse 92   Ht 6' 1 (1.854 m)   Wt 72.7 kg   BMI 21.14 kg/m   GEN: Well nourished, well developed in no acute distress NECK: No JVD; No carotid bruits CARDIAC: Irregularly irregular rate and rhythm, no murmurs, rubs, gallops RESPIRATORY:  Clear to auscultation without rales, wheezing or rhonchi, O2 nasal canula ABDOMEN: Soft, non-tender, non-distended EXTREMITIES:  No edema; No deformity    Wt Readings from Last 3 Encounters:  04/13/24 72.7 kg  04/09/24 72.4 kg  04/06/24 73 kg     EKG today demonstrates  Afib, PVC triplet vs aberrantly conducted beats Vent. rate 92 BPM PR interval * ms QRS duration 96 ms QT/QTcB 358/442 ms   Echo 03/20/24 demonstrated   1. Left ventricular ejection fraction, by estimation, is 30 to 35%. The  left ventricle has moderately decreased function. The left ventricle  demonstrates global hypokinesis. The left ventricular internal cavity size  was mildly dilated. Left ventricular diastolic parameters are indeterminate.   2. Abnormal septal motion and septal flattening suggesting significant  pulmnary HTN. Right ventricular systolic function is severely reduced. The  right ventricular size is moderately enlarged. Moderately increased right  ventricular wall  thickness. There is severely elevated pulmonary artery systolic pressure.   3. Left atrial size was moderately dilated.   4. Right atrial size was severely dilated.   5. The mitral valve is abnormal. Mild mitral valve regurgitation. No  evidence of mitral stenosis.   6. Tricuspid valve regurgitation is moderate.   7. The aortic valve is tricuspid. Aortic valve regurgitation is not  visualized. No aortic stenosis is present.   8. The inferior vena cava is dilated in size with <50% respiratory  variability, suggesting right atrial pressure of 15 mmHg.    CHA2DS2-VASc Score = 4  The patient's score is based upon: CHF History: 1 HTN History: 1 Diabetes History: 0 Stroke History: 0 Vascular Disease History: 0 Age Score: 2 Gender Score: 0       ASSESSMENT AND PLAN: Persistent Atrial Fibrillation (ICD10:  I48.19) The patient's CHA2DS2-VASc score is 4, indicating a 4.8% annual risk of stroke.   Patient back in rate controlled afib. We discussed rhythm control options today. Would like to keep him  in SR given his reduced EF and recent admission for CHF. Will arrange for repeat DCCV.  Continue higher dose of amiodarone  200 mg BID for now. Could consider changing to dofetilide but this would require a 3 month washout of amiodarone . Unlikely ablation candidate given his other comorbidities. Will have him follow up with EP post DCCV.  Continue Eliquis  5 mg BID (he did miss a dose on 7/18 or 7/19, will schedule DCCV at least 3 weeks from that time).  Check bmet/cbc  Secondary Hypercoagulable State (ICD10:  D68.69) The patient is at significant risk for stroke/thromboembolism based upon his CHA2DS2-VASc Score of 4.  Continue Apixaban  (Eliquis ).   High Risk Medication Monitoring (ICD 10: Z79.899) Intervals on ECG acceptable for amiodarone  monitoring.   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   Informed Consent   Shared Decision Making/Informed Consent The risks (stroke, cardiac arrhythmias rarely resulting in the need for a temporary or permanent pacemaker, skin irritation or burns and complications associated with conscious sedation including aspiration, arrhythmia, respiratory failure and death), benefits (restoration of normal sinus rhythm) and alternatives of a direct current cardioversion were explained in detail to Mr. Vonderhaar and he agrees to proceed.           Daril Kicks PA-C Afib Clinic [facility] [address] [place], [place] [address] [phone]

## 2024-04-13 NOTE — Progress Notes (Incomplete)
 Primary Care Physician: Kip Righter, MD Primary Cardiologist: Ozell Fell, MD Electrophysiologist: Will Gladis Norton, MD  [facility]: Dr Zenaida Referring Physician: Dr Fell Norleen JINNY Rudell is a 83 y.o. 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  The patient was initially diagnosed with atrial fibrillation 11/22/23. Patient was started on Eliquis  for stroke prevention. He was sent for an echocardiogram on 12/19/23 and presented in afib with RVR. Patient was unaware of his arrhythmia. He was seen by Dr Fell and was started on carvedilol . Echo showed EF 35-40%, severe LAE, no significant valvular issues. He was set up for DCCV on 3/12 but presented in SR.   Patient returns for follow up for atrial fibrillation and amiodarone  monitoring. Patient was discharged 03/27/24 after an extended hospitalization for acute respiratory failure due to acute CHF and recurrent right pleural effusion. Seen by pulmonary and felt to have underlying ILD as well. He underwent DCCV on 03/24/24.  Today, he  denies symptoms of ***palpitations, chest pain, shortness of breath, orthopnea, PND, lower extremity edema, dizziness, presyncope, syncope, snoring, daytime somnolence, bleeding, or neurologic sequela. The patient is tolerating medications without difficulties and is otherwise without complaint today.    Atrial Fibrillation Risk Factors:  he does not have symptoms or diagnosis of sleep apnea. he does not have a history of rheumatic fever. he does have a history of alcohol use.  The patient does have a history of early familial atrial fibrillation or other arrhythmias. Mother had afib.   Atrial Fibrillation Management history:  Previous antiarrhythmic drugs: amiodarone   Previous cardioversions: 03/05/24, 03/24/24 Previous ablations: none Anticoagulation history:  Eliquis   ROS- All systems are reviewed and negative except as per the HPI above.  Past Medical History:  Diagnosis Date   Arthritis    oa   BPH (benign prostatic hyperplasia)    Cancer (HCC) 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   Cataracts, bilateral    Glaucoma    Hypertension    Vitamin D  deficiency     Current Outpatient Medications  Medication Sig Dispense Refill   acetaminophen  (TYLENOL ) 325 MG tablet Take 650 mg by mouth daily as needed for moderate pain (pain score 4-6) or mild pain (pain score 1-3).     albuterol  (VENTOLIN  HFA) 108 (90 Base) MCG/ACT inhaler Inhale 2 puffs into the lungs every 4 (four) hours as needed for shortness of breath.     amiodarone  (PACERONE ) 200 MG tablet Take 1 tablet (200 mg total) by mouth 2 (two) times daily. 60 tablet 3   cholecalciferol  (VITAMIN D ) 1000 units tablet Take 1,000 Units by mouth daily.     dapagliflozin  propanediol (FARXIGA ) 10 MG TABS tablet Take 1 tablet (10 mg total) by mouth daily before breakfast. 30 tablet 11   docusate sodium  (COLACE) 100 MG capsule Take 1 capsule (100 mg total) by mouth 2 (two) times daily as needed for mild constipation.     dorzolamide -timolol  (COSOPT ) 22.3-6.8 MG/ML ophthalmic solution Place 1 drop into both eyes 2 (two) times daily.     ELIQUIS  5 MG TABS tablet Take 5 mg by mouth 2 (two) times daily.     feeding supplement (ENSURE PLUS HIGH PROTEIN) LIQD Take 237 mLs by mouth 3 (three) times daily between meals.     finasteride  (PROSCAR ) 5 MG tablet Take 5 mg by mouth daily.  furosemide  (LASIX ) 20 MG tablet Take 1 tablet (20 mg total) by mouth daily. 90 tablet 3   levothyroxine  (SYNTHROID ) 50 MCG tablet Take 1 tablet (50 mcg total) by mouth daily at 6 (six) AM. 30 tablet 0   mirtazapine  (REMERON ) 7.5 MG tablet Take 1 tablet (7.5 mg total) by mouth at bedtime. 30 tablet 0   Multiple Vitamin (MULTIVITAMIN WITH MINERALS) TABS tablet Take 1 tablet by mouth daily. 30 tablet 0   Nutritional Supplements  (,FEEDING SUPPLEMENT, PROSOURCE PLUS) liquid Take 30 mLs by mouth 2 (two) times daily before lunch and supper. 887 mL 0   polyethylene glycol (MIRALAX  / GLYCOLAX ) 17 g packet Take 17 g by mouth 2 (two) times daily as needed.     No current facility-administered medications for this visit.    Physical Exam: There were no vitals taken for this visit.  GEN: Well nourished, well developed in no acute distress NECK: No JVD; No carotid bruits CARDIAC: {EPRHYTHM:28826}, no murmurs, rubs, gallops RESPIRATORY:  Clear to auscultation without rales, wheezing or rhonchi  ABDOMEN: Soft, non-tender, non-distended EXTREMITIES:  No edema; No deformity    Wt Readings from Last 3 Encounters:  04/09/24 72.4 kg  04/06/24 73 kg  04/03/24 74.6 kg     EKG today demonstrates  ***   Echo *** demonstrated  ***   CHA2DS2-VASc Score = 4  The patient's score is based upon: CHF History: 1 HTN History: 1 Diabetes History: 0 Stroke History: 0 Vascular Disease History: 0 Age Score: 2 Gender Score: 0   {Confirm score is correct.  If not, click here to update score.  REFRESH note.  :1}    ASSESSMENT AND PLAN: {Select the correct AFib Diagnosis                 :[id number]}   SignedQuita JONELLE Kicks, PA    04/13/2024 9:26 AM          ASSESSMENT AND PLAN: Paroxysmal Atrial Fibrillation (ICD10:  I48.0) The patient's CHA2DS2-VASc score is 4, indicating a 4.8% annual risk of stroke.   General education about afib provided and questions answered.  We also discussed rhythm control options including AAD (dofetilide, amiodarone ) and ablation. After discussing the risks and benefits, will start amiodarone  200 mg BID x 4 weeks then decrease to once daily. Last TSH and cmet reviewed.  Long term, patient would like to have a consultation with EP to discuss ablation.  Continue carvedilol  6.25 mg BID Continue Eliquis  5 mg BID  Secondary Hypercoagulable State (ICD10:  D68.69) The patient is at significant  risk for stroke/thromboembolism based upon his CHA2DS2-VASc Score of 4.  Continue Apixaban  (Eliquis ). No bleeding issues.   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 Started on carvedilol  and valsartan  by Dr Wonda Will need repeat echo once stable in SR.  Fluid status appears stable today  HTN Stable on current regimen    Follow up ***       Daril Kicks PA-C Afib Clinic [facility] [address] [place], [place] [address] [phone]

## 2024-04-13 NOTE — Patient Instructions (Addendum)
 HOLD Farxiga  AFTER JULY 7TH  Resume after cardioversion July 11th  Continue Amiodarone  twice a day until seen by Dr. Inocencio     Cardioversion scheduled for: FRIDAY JULY 11   - Arrive at the [facility] ([address])  and check in with ADMITTING at 7:00AM   - Do not eat or drink anything after midnight the night prior to your procedure.   - Take all your morning medication (except diabetic medications) with a sip of water  prior to arrival.  - Do NOT miss any doses of your blood thinner - if you should miss a dose or take a dose more than 4 hours late -- please notify our office immediately.  - You will not be able to drive home after your procedure. Please ensure you have a responsible adult to drive you home. You will need someone with you for 24 hours post procedure.     - Expect to be in the procedural area approximately 2 hours.   - If you feel as if you go back into normal rhythm prior to scheduled cardioversion, please notify our office immediately.   If your procedure is canceled in the cardioversion suite you will be charged a cancellation fee.       Hold below medications 72 hours prior to scheduled procedure/anesthesia. Restart medication on the following day after scheduled procedure/anesthesia  Dapagliflozin  (Farxiga )    For those patients who have a scheduled procedure/anesthesia on the same day of the week as their dose, hold the medication on the day of surgery.  They can take their scheduled dose the week before.  **Patients on the above medications scheduled for elective procedures that have not held the medication for the appropriate amount of time are at risk of cancellation or change in the anesthetic plan.

## 2024-04-14 ENCOUNTER — Ambulatory Visit (HOSPITAL_COMMUNITY): Payer: Self-pay | Admitting: Physician Assistant

## 2024-04-15 DIAGNOSIS — M1990 Unspecified osteoarthritis, unspecified site: Secondary | ICD-10-CM | POA: Diagnosis not present

## 2024-04-15 DIAGNOSIS — E559 Vitamin D deficiency, unspecified: Secondary | ICD-10-CM | POA: Diagnosis not present

## 2024-04-15 DIAGNOSIS — N39498 Other specified urinary incontinence: Secondary | ICD-10-CM | POA: Diagnosis not present

## 2024-04-15 DIAGNOSIS — Z556 Problems related to health literacy: Secondary | ICD-10-CM | POA: Diagnosis not present

## 2024-04-15 DIAGNOSIS — E43 Unspecified severe protein-calorie malnutrition: Secondary | ICD-10-CM | POA: Diagnosis not present

## 2024-04-15 DIAGNOSIS — J9601 Acute respiratory failure with hypoxia: Secondary | ICD-10-CM | POA: Diagnosis not present

## 2024-04-15 DIAGNOSIS — Z7984 Long term (current) use of oral hypoglycemic drugs: Secondary | ICD-10-CM | POA: Diagnosis not present

## 2024-04-15 DIAGNOSIS — Z604 Social exclusion and rejection: Secondary | ICD-10-CM | POA: Diagnosis not present

## 2024-04-15 DIAGNOSIS — Z6822 Body mass index (BMI) 22.0-22.9, adult: Secondary | ICD-10-CM | POA: Diagnosis not present

## 2024-04-15 DIAGNOSIS — Z7901 Long term (current) use of anticoagulants: Secondary | ICD-10-CM | POA: Diagnosis not present

## 2024-04-15 DIAGNOSIS — Z9981 Dependence on supplemental oxygen: Secondary | ICD-10-CM | POA: Diagnosis not present

## 2024-04-15 DIAGNOSIS — I110 Hypertensive heart disease with heart failure: Secondary | ICD-10-CM | POA: Diagnosis not present

## 2024-04-15 DIAGNOSIS — H409 Unspecified glaucoma: Secondary | ICD-10-CM | POA: Diagnosis not present

## 2024-04-15 DIAGNOSIS — I272 Pulmonary hypertension, unspecified: Secondary | ICD-10-CM | POA: Diagnosis not present

## 2024-04-15 DIAGNOSIS — J849 Interstitial pulmonary disease, unspecified: Secondary | ICD-10-CM | POA: Diagnosis not present

## 2024-04-15 DIAGNOSIS — I50811 Acute right heart failure: Secondary | ICD-10-CM | POA: Diagnosis not present

## 2024-04-15 DIAGNOSIS — D6869 Other thrombophilia: Secondary | ICD-10-CM | POA: Diagnosis not present

## 2024-04-15 DIAGNOSIS — I4819 Other persistent atrial fibrillation: Secondary | ICD-10-CM | POA: Diagnosis not present

## 2024-04-15 DIAGNOSIS — N401 Enlarged prostate with lower urinary tract symptoms: Secondary | ICD-10-CM | POA: Diagnosis not present

## 2024-04-15 DIAGNOSIS — Z86711 Personal history of pulmonary embolism: Secondary | ICD-10-CM | POA: Diagnosis not present

## 2024-04-15 DIAGNOSIS — I2510 Atherosclerotic heart disease of native coronary artery without angina pectoris: Secondary | ICD-10-CM | POA: Diagnosis not present

## 2024-04-15 DIAGNOSIS — E039 Hypothyroidism, unspecified: Secondary | ICD-10-CM | POA: Diagnosis not present

## 2024-04-15 DIAGNOSIS — N179 Acute kidney failure, unspecified: Secondary | ICD-10-CM | POA: Diagnosis not present

## 2024-04-15 DIAGNOSIS — I5042 Chronic combined systolic (congestive) and diastolic (congestive) heart failure: Secondary | ICD-10-CM | POA: Diagnosis not present

## 2024-04-15 LAB — ACID FAST CULTURE WITH REFLEXED SENSITIVITIES (MYCOBACTERIA): Acid Fast Culture: NEGATIVE

## 2024-04-20 ENCOUNTER — Encounter: Payer: Self-pay | Admitting: Cardiovascular Disease

## 2024-04-20 ENCOUNTER — Other Ambulatory Visit (HOSPITAL_COMMUNITY): Payer: Self-pay

## 2024-04-20 DIAGNOSIS — E559 Vitamin D deficiency, unspecified: Secondary | ICD-10-CM | POA: Diagnosis not present

## 2024-04-20 DIAGNOSIS — Z604 Social exclusion and rejection: Secondary | ICD-10-CM | POA: Diagnosis not present

## 2024-04-20 DIAGNOSIS — I50811 Acute right heart failure: Secondary | ICD-10-CM | POA: Diagnosis not present

## 2024-04-20 DIAGNOSIS — N401 Enlarged prostate with lower urinary tract symptoms: Secondary | ICD-10-CM | POA: Diagnosis not present

## 2024-04-20 DIAGNOSIS — I2510 Atherosclerotic heart disease of native coronary artery without angina pectoris: Secondary | ICD-10-CM | POA: Diagnosis not present

## 2024-04-20 DIAGNOSIS — Z6822 Body mass index (BMI) 22.0-22.9, adult: Secondary | ICD-10-CM | POA: Diagnosis not present

## 2024-04-20 DIAGNOSIS — I272 Pulmonary hypertension, unspecified: Secondary | ICD-10-CM | POA: Diagnosis not present

## 2024-04-20 DIAGNOSIS — E039 Hypothyroidism, unspecified: Secondary | ICD-10-CM | POA: Diagnosis not present

## 2024-04-20 DIAGNOSIS — J849 Interstitial pulmonary disease, unspecified: Secondary | ICD-10-CM | POA: Diagnosis not present

## 2024-04-20 DIAGNOSIS — M1990 Unspecified osteoarthritis, unspecified site: Secondary | ICD-10-CM | POA: Diagnosis not present

## 2024-04-20 DIAGNOSIS — I110 Hypertensive heart disease with heart failure: Secondary | ICD-10-CM | POA: Diagnosis not present

## 2024-04-20 DIAGNOSIS — N39498 Other specified urinary incontinence: Secondary | ICD-10-CM | POA: Diagnosis not present

## 2024-04-20 DIAGNOSIS — Z7901 Long term (current) use of anticoagulants: Secondary | ICD-10-CM | POA: Diagnosis not present

## 2024-04-20 DIAGNOSIS — H409 Unspecified glaucoma: Secondary | ICD-10-CM | POA: Diagnosis not present

## 2024-04-20 DIAGNOSIS — E43 Unspecified severe protein-calorie malnutrition: Secondary | ICD-10-CM | POA: Diagnosis not present

## 2024-04-20 DIAGNOSIS — I4819 Other persistent atrial fibrillation: Secondary | ICD-10-CM | POA: Diagnosis not present

## 2024-04-20 DIAGNOSIS — Z86711 Personal history of pulmonary embolism: Secondary | ICD-10-CM | POA: Diagnosis not present

## 2024-04-20 DIAGNOSIS — D6869 Other thrombophilia: Secondary | ICD-10-CM | POA: Diagnosis not present

## 2024-04-20 DIAGNOSIS — I5042 Chronic combined systolic (congestive) and diastolic (congestive) heart failure: Secondary | ICD-10-CM | POA: Diagnosis not present

## 2024-04-20 DIAGNOSIS — Z556 Problems related to health literacy: Secondary | ICD-10-CM | POA: Diagnosis not present

## 2024-04-20 DIAGNOSIS — N179 Acute kidney failure, unspecified: Secondary | ICD-10-CM | POA: Diagnosis not present

## 2024-04-20 DIAGNOSIS — Z7984 Long term (current) use of oral hypoglycemic drugs: Secondary | ICD-10-CM | POA: Diagnosis not present

## 2024-04-20 DIAGNOSIS — J9601 Acute respiratory failure with hypoxia: Secondary | ICD-10-CM | POA: Diagnosis not present

## 2024-04-20 DIAGNOSIS — Z9981 Dependence on supplemental oxygen: Secondary | ICD-10-CM | POA: Diagnosis not present

## 2024-04-21 DIAGNOSIS — I50811 Acute right heart failure: Secondary | ICD-10-CM | POA: Diagnosis not present

## 2024-04-21 DIAGNOSIS — E43 Unspecified severe protein-calorie malnutrition: Secondary | ICD-10-CM | POA: Diagnosis not present

## 2024-04-21 DIAGNOSIS — N401 Enlarged prostate with lower urinary tract symptoms: Secondary | ICD-10-CM | POA: Diagnosis not present

## 2024-04-21 DIAGNOSIS — Z604 Social exclusion and rejection: Secondary | ICD-10-CM | POA: Diagnosis not present

## 2024-04-21 DIAGNOSIS — Z7901 Long term (current) use of anticoagulants: Secondary | ICD-10-CM | POA: Diagnosis not present

## 2024-04-21 DIAGNOSIS — E559 Vitamin D deficiency, unspecified: Secondary | ICD-10-CM | POA: Diagnosis not present

## 2024-04-21 DIAGNOSIS — E039 Hypothyroidism, unspecified: Secondary | ICD-10-CM | POA: Diagnosis not present

## 2024-04-21 DIAGNOSIS — I5042 Chronic combined systolic (congestive) and diastolic (congestive) heart failure: Secondary | ICD-10-CM | POA: Diagnosis not present

## 2024-04-21 DIAGNOSIS — I272 Pulmonary hypertension, unspecified: Secondary | ICD-10-CM | POA: Diagnosis not present

## 2024-04-21 DIAGNOSIS — I110 Hypertensive heart disease with heart failure: Secondary | ICD-10-CM | POA: Diagnosis not present

## 2024-04-21 DIAGNOSIS — J849 Interstitial pulmonary disease, unspecified: Secondary | ICD-10-CM | POA: Diagnosis not present

## 2024-04-21 DIAGNOSIS — N39498 Other specified urinary incontinence: Secondary | ICD-10-CM | POA: Diagnosis not present

## 2024-04-21 DIAGNOSIS — Z6822 Body mass index (BMI) 22.0-22.9, adult: Secondary | ICD-10-CM | POA: Diagnosis not present

## 2024-04-21 DIAGNOSIS — Z86711 Personal history of pulmonary embolism: Secondary | ICD-10-CM | POA: Diagnosis not present

## 2024-04-21 DIAGNOSIS — I2510 Atherosclerotic heart disease of native coronary artery without angina pectoris: Secondary | ICD-10-CM | POA: Diagnosis not present

## 2024-04-21 DIAGNOSIS — J9601 Acute respiratory failure with hypoxia: Secondary | ICD-10-CM | POA: Diagnosis not present

## 2024-04-21 DIAGNOSIS — Z9981 Dependence on supplemental oxygen: Secondary | ICD-10-CM | POA: Diagnosis not present

## 2024-04-21 DIAGNOSIS — Z556 Problems related to health literacy: Secondary | ICD-10-CM | POA: Diagnosis not present

## 2024-04-21 DIAGNOSIS — M1990 Unspecified osteoarthritis, unspecified site: Secondary | ICD-10-CM | POA: Diagnosis not present

## 2024-04-21 DIAGNOSIS — I4819 Other persistent atrial fibrillation: Secondary | ICD-10-CM | POA: Diagnosis not present

## 2024-04-21 DIAGNOSIS — D6869 Other thrombophilia: Secondary | ICD-10-CM | POA: Diagnosis not present

## 2024-04-21 DIAGNOSIS — H409 Unspecified glaucoma: Secondary | ICD-10-CM | POA: Diagnosis not present

## 2024-04-21 DIAGNOSIS — N179 Acute kidney failure, unspecified: Secondary | ICD-10-CM | POA: Diagnosis not present

## 2024-04-21 DIAGNOSIS — Z7984 Long term (current) use of oral hypoglycemic drugs: Secondary | ICD-10-CM | POA: Diagnosis not present

## 2024-04-23 DIAGNOSIS — N39498 Other specified urinary incontinence: Secondary | ICD-10-CM | POA: Diagnosis not present

## 2024-04-23 DIAGNOSIS — J849 Interstitial pulmonary disease, unspecified: Secondary | ICD-10-CM | POA: Diagnosis not present

## 2024-04-23 DIAGNOSIS — J9601 Acute respiratory failure with hypoxia: Secondary | ICD-10-CM | POA: Diagnosis not present

## 2024-04-23 DIAGNOSIS — I272 Pulmonary hypertension, unspecified: Secondary | ICD-10-CM | POA: Diagnosis not present

## 2024-04-23 DIAGNOSIS — I110 Hypertensive heart disease with heart failure: Secondary | ICD-10-CM | POA: Diagnosis not present

## 2024-04-23 DIAGNOSIS — Z9981 Dependence on supplemental oxygen: Secondary | ICD-10-CM | POA: Diagnosis not present

## 2024-04-23 DIAGNOSIS — Z7901 Long term (current) use of anticoagulants: Secondary | ICD-10-CM | POA: Diagnosis not present

## 2024-04-23 DIAGNOSIS — I2510 Atherosclerotic heart disease of native coronary artery without angina pectoris: Secondary | ICD-10-CM | POA: Diagnosis not present

## 2024-04-23 DIAGNOSIS — E039 Hypothyroidism, unspecified: Secondary | ICD-10-CM | POA: Diagnosis not present

## 2024-04-23 DIAGNOSIS — H409 Unspecified glaucoma: Secondary | ICD-10-CM | POA: Diagnosis not present

## 2024-04-23 DIAGNOSIS — E43 Unspecified severe protein-calorie malnutrition: Secondary | ICD-10-CM | POA: Diagnosis not present

## 2024-04-23 DIAGNOSIS — I5042 Chronic combined systolic (congestive) and diastolic (congestive) heart failure: Secondary | ICD-10-CM | POA: Diagnosis not present

## 2024-04-23 DIAGNOSIS — Z604 Social exclusion and rejection: Secondary | ICD-10-CM | POA: Diagnosis not present

## 2024-04-23 DIAGNOSIS — D6869 Other thrombophilia: Secondary | ICD-10-CM | POA: Diagnosis not present

## 2024-04-23 DIAGNOSIS — I50811 Acute right heart failure: Secondary | ICD-10-CM | POA: Diagnosis not present

## 2024-04-23 DIAGNOSIS — E559 Vitamin D deficiency, unspecified: Secondary | ICD-10-CM | POA: Diagnosis not present

## 2024-04-23 DIAGNOSIS — N179 Acute kidney failure, unspecified: Secondary | ICD-10-CM | POA: Diagnosis not present

## 2024-04-23 DIAGNOSIS — Z556 Problems related to health literacy: Secondary | ICD-10-CM | POA: Diagnosis not present

## 2024-04-23 DIAGNOSIS — Z86711 Personal history of pulmonary embolism: Secondary | ICD-10-CM | POA: Diagnosis not present

## 2024-04-23 DIAGNOSIS — M1990 Unspecified osteoarthritis, unspecified site: Secondary | ICD-10-CM | POA: Diagnosis not present

## 2024-04-23 DIAGNOSIS — N401 Enlarged prostate with lower urinary tract symptoms: Secondary | ICD-10-CM | POA: Diagnosis not present

## 2024-04-23 DIAGNOSIS — I4819 Other persistent atrial fibrillation: Secondary | ICD-10-CM | POA: Diagnosis not present

## 2024-04-23 DIAGNOSIS — Z7984 Long term (current) use of oral hypoglycemic drugs: Secondary | ICD-10-CM | POA: Diagnosis not present

## 2024-04-23 DIAGNOSIS — Z6822 Body mass index (BMI) 22.0-22.9, adult: Secondary | ICD-10-CM | POA: Diagnosis not present

## 2024-04-23 NOTE — Anesthesia Preprocedure Evaluation (Addendum)
 Anesthesia Evaluation  Patient identified by MRN, date of birth, ID band Patient awake    Reviewed: Allergy & Precautions, NPO status , Patient's Chart, lab work & pertinent test results  Airway Mallampati: II  TM Distance: >3 FB Neck ROM: Full    Dental no notable dental hx. (+) Poor Dentition, Dental Advisory Given   Pulmonary shortness of breath, former smoker Hx/o bilateral pleural effusions s/p chest tubes   Pulmonary exam normal breath sounds clear to auscultation       Cardiovascular hypertension, Pt. on medications and Pt. on home beta blockers pulmonary hypertension+CHF   Rhythm:Irregular Rate:Normal  Echo 03/20/24  1. Left ventricular ejection fraction, by estimation, is 30 to 35%. The left ventricle has moderately decreased function. The left ventricle demonstrates global hypokinesis. The left ventricular internal cavity size was mildly dilated. Left ventricular diastolic parameters are indeterminate.   2. Abnormal septal motion and septal flattening suggesting significant pulmonary HTN. Right ventricular systolic function is severely reduced. The right ventricular size is moderately enlarged. Moderately increased right ventricular wall thickness. There   is severely elevated pulmonary artery systolic pressure.   3. Left atrial size was moderately dilated.   4. Right atrial size was severely dilated.   5. The mitral valve is abnormal. Mild mitral valve regurgitation. No evidence of mitral stenosis.   6. Tricuspid valve regurgitation is moderate.   7. The aortic valve is tricuspid. Aortic valve regurgitation is not visualized. No aortic stenosis is present.   8. The inferior vena cava is dilated in size with <50% respiratory variability, suggesting right atrial pressure of 15 mmHg.   Cardiac Cath 03/23/24 1. Normal left and right heart filling pressures.  2. Borderline elevated PA pressure.  3. Preserved cardiac output.     EKG 6225 Atrial fibrillation Nonspecific IVCD with LAD Probable anteroseptal infarct, old   Neuro/Psych Glaucoma  negative psych ROS   GI/Hepatic negative GI ROS, Neg liver ROS,,,  Endo/Other  diabetes, Well Controlled, Type 2, Oral Hypoglycemic Agents    Renal/GU negative Renal ROS   BPH    Musculoskeletal  (+) Arthritis , Osteoarthritis,  Hx/o melanoma right eyebrow   Abdominal   Peds  Hematology Eliquis  therapy- last dose   Anesthesia Other Findings   Reproductive/Obstetrics ED                              Anesthesia Physical Anesthesia Plan  ASA: 4  Anesthesia Plan: General   Post-op Pain Management: Minimal or no pain anticipated   Induction: Intravenous  PONV Risk Score and Plan: 3 and Treatment may vary due to age or medical condition and Propofol  infusion  Airway Management Planned: Natural Airway and Nasal Cannula  Additional Equipment: None  Intra-op Plan:   Post-operative Plan:   Informed Consent: I have reviewed the patients History and Physical, chart, labs and discussed the procedure including the risks, benefits and alternatives for the proposed anesthesia with the patient or authorized representative who has indicated his/her understanding and acceptance.     Dental advisory given  Plan Discussed with: CRNA  Anesthesia Plan Comments:          Anesthesia Quick Evaluation

## 2024-04-23 NOTE — Progress Notes (Signed)
 Spoke to patient and instructed them to come at 0700  and to be NPO after 0000.     Confirmed that patient will have a ride home and someone to stay with them for 24 hours after the procedure.  Confirmed blood thinner. Confirmed no breaks in taking blood thinner for 3+ weeks prior to procedure. Confirmed patient stopped all GLP-1s and GLP-2s for at least one week before procedure.

## 2024-04-24 ENCOUNTER — Encounter (HOSPITAL_COMMUNITY)
Admission: RE | Disposition: A | Discharge status: Home / Self Care | Payer: Self-pay | Source: Home / Self Care | Attending: Internal Medicine

## 2024-04-24 ENCOUNTER — Ambulatory Visit (HOSPITAL_BASED_OUTPATIENT_CLINIC_OR_DEPARTMENT_OTHER): Payer: Self-pay | Admitting: Anesthesiology

## 2024-04-24 ENCOUNTER — Ambulatory Visit (HOSPITAL_COMMUNITY): Payer: Self-pay | Admitting: Anesthesiology

## 2024-04-24 ENCOUNTER — Ambulatory Visit (HOSPITAL_COMMUNITY)
Admission: RE | Admit: 2024-04-24 | Discharge: 2024-04-24 | Disposition: A | Discharge status: Home / Self Care | Attending: Internal Medicine | Admitting: Internal Medicine

## 2024-04-24 ENCOUNTER — Other Ambulatory Visit: Payer: Self-pay

## 2024-04-24 DIAGNOSIS — I110 Hypertensive heart disease with heart failure: Secondary | ICD-10-CM | POA: Diagnosis not present

## 2024-04-24 DIAGNOSIS — Z7901 Long term (current) use of anticoagulants: Secondary | ICD-10-CM | POA: Insufficient documentation

## 2024-04-24 DIAGNOSIS — I4891 Unspecified atrial fibrillation: Secondary | ICD-10-CM | POA: Diagnosis not present

## 2024-04-24 DIAGNOSIS — I5043 Acute on chronic combined systolic (congestive) and diastolic (congestive) heart failure: Secondary | ICD-10-CM

## 2024-04-24 DIAGNOSIS — D6869 Other thrombophilia: Secondary | ICD-10-CM | POA: Diagnosis not present

## 2024-04-24 DIAGNOSIS — Z7984 Long term (current) use of oral hypoglycemic drugs: Secondary | ICD-10-CM | POA: Diagnosis not present

## 2024-04-24 DIAGNOSIS — I5022 Chronic systolic (congestive) heart failure: Secondary | ICD-10-CM | POA: Insufficient documentation

## 2024-04-24 DIAGNOSIS — J849 Interstitial pulmonary disease, unspecified: Secondary | ICD-10-CM | POA: Insufficient documentation

## 2024-04-24 DIAGNOSIS — I4819 Other persistent atrial fibrillation: Secondary | ICD-10-CM | POA: Insufficient documentation

## 2024-04-24 DIAGNOSIS — Z79899 Other long term (current) drug therapy: Secondary | ICD-10-CM | POA: Diagnosis not present

## 2024-04-24 DIAGNOSIS — E119 Type 2 diabetes mellitus without complications: Secondary | ICD-10-CM | POA: Diagnosis not present

## 2024-04-24 DIAGNOSIS — Z87891 Personal history of nicotine dependence: Secondary | ICD-10-CM

## 2024-04-24 HISTORY — PX: CARDIOVERSION: EP1203

## 2024-04-24 SURGERY — CARDIOVERSION (CATH LAB)
Anesthesia: General

## 2024-04-24 MED ORDER — LIDOCAINE 2% (20 MG/ML) 5 ML SYRINGE
INTRAMUSCULAR | Status: DC | PRN
  Administered 2024-04-24: 40 mg via INTRAVENOUS

## 2024-04-24 MED ORDER — PROPOFOL 10 MG/ML IV BOLUS
INTRAVENOUS | Status: DC | PRN
  Administered 2024-04-24: 40 mg via INTRAVENOUS

## 2024-04-24 SURGICAL SUPPLY — 1 items: PAD DEFIB RADIO PHYSIO CONN (PAD) ×1 IMPLANT

## 2024-04-24 NOTE — CV Procedure (Signed)
 Electrical Cardioversion Procedure Note JERROL HELMERS [id number] 1941-02-03  Procedure: Electrical Cardioversion Indications:  Atrial Fibrillation  Procedure Details Consent: Risks of procedure as well as the alternatives and risks of each were explained to the (patient/caregiver).  Consent for procedure obtained. Time Out: Verified patient identification, verified procedure, site/side was marked, verified correct patient position, special equipment/implants available, medications/allergies/relevent history reviewed, required imaging and test results available.  Performed  Patient placed on cardiac monitor, pulse oximetry, supplemental oxygen as necessary.  Sedation given: propofol  and lidocaine   Pacer pads placed anterior and posterior chest.  Cardioverted 1 time(s).  Cardioverted at 200J synchronized biphasic energy  Evaluation Findings: Post procedure EKG shows: NSR Complications: None Patient did tolerate procedure well.   Vina Gull 04/24/2024, 8:22 AM

## 2024-04-24 NOTE — Interval H&P Note (Signed)
 History and Physical Interval Note:  04/24/2024 7:27 AM  Samuel Porter  has presented today for surgery, with the diagnosis of AFIB.  The various methods of treatment have been discussed with the patient and family. After consideration of risks, benefits and other options for treatment, the patient has consented to  Procedure(s): CARDIOVERSION (N/A) as a surgical intervention.  The patient's history has been reviewed, patient examined, no change in status, stable for surgery.  I have reviewed the patient's chart and labs.  Questions were answered to the patient's satisfaction.     Vina Gull

## 2024-04-24 NOTE — Anesthesia Postprocedure Evaluation (Signed)
 Anesthesia Post Note  Patient: Samuel Porter  Procedure(s) Performed: CARDIOVERSION     Patient location during evaluation: PACU Anesthesia Type: General Level of consciousness: sedated and patient cooperative Pain management: pain level controlled Vital Signs Assessment: post-procedure vital signs reviewed and stable Respiratory status: spontaneous breathing Cardiovascular status: stable Anesthetic complications: no   No notable events documented.  Last Vitals:  Vitals:   04/24/24 0820 04/24/24 0830  BP: (!) 92/56 107/76  Pulse: (!) 48 (!) 49  Resp: 17 16  Temp:    SpO2: 97% 98%    Last Pain:  Vitals:   04/24/24 0830  TempSrc:   PainSc: 0-No pain                 Norleen Pope

## 2024-04-24 NOTE — Transfer of Care (Signed)
 Immediate Anesthesia Transfer of Care Note  Patient: Samuel Porter  Procedure(s) Performed: CARDIOVERSION  Patient Location: PACU  Anesthesia Type:General  Level of Consciousness: drowsy and patient cooperative  Airway & Oxygen Therapy: Patient Spontanous Breathing and Patient connected to nasal cannula oxygen  Post-op Assessment: Report given to RN and Post -op Vital signs reviewed and stable  Post vital signs: Reviewed and stable  Last Vitals:  Vitals Value Taken Time  BP 119/82 04/24/24 07:45  Temp    Pulse 96 04/24/24 07:51  Resp 0 04/24/24 07:51  SpO2 97 % 04/24/24 07:51  Vitals shown include unfiled device data.  Last Pain:  Vitals:   04/24/24 0713  TempSrc: Temporal  PainSc: 0-No pain         Complications: No notable events documented.

## 2024-04-25 ENCOUNTER — Encounter (HOSPITAL_COMMUNITY): Payer: Self-pay | Admitting: Internal Medicine

## 2024-04-26 DIAGNOSIS — I5020 Unspecified systolic (congestive) heart failure: Secondary | ICD-10-CM | POA: Diagnosis not present

## 2024-04-27 DIAGNOSIS — E559 Vitamin D deficiency, unspecified: Secondary | ICD-10-CM | POA: Diagnosis not present

## 2024-04-27 DIAGNOSIS — I2510 Atherosclerotic heart disease of native coronary artery without angina pectoris: Secondary | ICD-10-CM | POA: Diagnosis not present

## 2024-04-27 DIAGNOSIS — N401 Enlarged prostate with lower urinary tract symptoms: Secondary | ICD-10-CM | POA: Diagnosis not present

## 2024-04-27 DIAGNOSIS — D6869 Other thrombophilia: Secondary | ICD-10-CM | POA: Diagnosis not present

## 2024-04-27 DIAGNOSIS — Z556 Problems related to health literacy: Secondary | ICD-10-CM | POA: Diagnosis not present

## 2024-04-27 DIAGNOSIS — I5042 Chronic combined systolic (congestive) and diastolic (congestive) heart failure: Secondary | ICD-10-CM | POA: Diagnosis not present

## 2024-04-27 DIAGNOSIS — I50811 Acute right heart failure: Secondary | ICD-10-CM | POA: Diagnosis not present

## 2024-04-27 DIAGNOSIS — Z7901 Long term (current) use of anticoagulants: Secondary | ICD-10-CM | POA: Diagnosis not present

## 2024-04-27 DIAGNOSIS — I272 Pulmonary hypertension, unspecified: Secondary | ICD-10-CM | POA: Diagnosis not present

## 2024-04-27 DIAGNOSIS — I4819 Other persistent atrial fibrillation: Secondary | ICD-10-CM | POA: Diagnosis not present

## 2024-04-27 DIAGNOSIS — I110 Hypertensive heart disease with heart failure: Secondary | ICD-10-CM | POA: Diagnosis not present

## 2024-04-27 DIAGNOSIS — M1990 Unspecified osteoarthritis, unspecified site: Secondary | ICD-10-CM | POA: Diagnosis not present

## 2024-04-27 DIAGNOSIS — Z86711 Personal history of pulmonary embolism: Secondary | ICD-10-CM | POA: Diagnosis not present

## 2024-04-27 DIAGNOSIS — J9601 Acute respiratory failure with hypoxia: Secondary | ICD-10-CM | POA: Diagnosis not present

## 2024-04-27 DIAGNOSIS — E039 Hypothyroidism, unspecified: Secondary | ICD-10-CM | POA: Diagnosis not present

## 2024-04-27 DIAGNOSIS — Z604 Social exclusion and rejection: Secondary | ICD-10-CM | POA: Diagnosis not present

## 2024-04-27 DIAGNOSIS — J849 Interstitial pulmonary disease, unspecified: Secondary | ICD-10-CM | POA: Diagnosis not present

## 2024-04-27 DIAGNOSIS — Z7984 Long term (current) use of oral hypoglycemic drugs: Secondary | ICD-10-CM | POA: Diagnosis not present

## 2024-04-27 DIAGNOSIS — N39498 Other specified urinary incontinence: Secondary | ICD-10-CM | POA: Diagnosis not present

## 2024-04-27 DIAGNOSIS — N179 Acute kidney failure, unspecified: Secondary | ICD-10-CM | POA: Diagnosis not present

## 2024-04-27 DIAGNOSIS — E43 Unspecified severe protein-calorie malnutrition: Secondary | ICD-10-CM | POA: Diagnosis not present

## 2024-04-27 DIAGNOSIS — H409 Unspecified glaucoma: Secondary | ICD-10-CM | POA: Diagnosis not present

## 2024-04-27 DIAGNOSIS — Z9981 Dependence on supplemental oxygen: Secondary | ICD-10-CM | POA: Diagnosis not present

## 2024-04-27 DIAGNOSIS — Z6822 Body mass index (BMI) 22.0-22.9, adult: Secondary | ICD-10-CM | POA: Diagnosis not present

## 2024-04-29 DIAGNOSIS — I2510 Atherosclerotic heart disease of native coronary artery without angina pectoris: Secondary | ICD-10-CM | POA: Diagnosis not present

## 2024-04-29 DIAGNOSIS — N401 Enlarged prostate with lower urinary tract symptoms: Secondary | ICD-10-CM | POA: Diagnosis not present

## 2024-04-29 DIAGNOSIS — I272 Pulmonary hypertension, unspecified: Secondary | ICD-10-CM | POA: Diagnosis not present

## 2024-04-29 DIAGNOSIS — J849 Interstitial pulmonary disease, unspecified: Secondary | ICD-10-CM | POA: Diagnosis not present

## 2024-04-29 DIAGNOSIS — N39498 Other specified urinary incontinence: Secondary | ICD-10-CM | POA: Diagnosis not present

## 2024-04-29 DIAGNOSIS — Z6822 Body mass index (BMI) 22.0-22.9, adult: Secondary | ICD-10-CM | POA: Diagnosis not present

## 2024-04-29 DIAGNOSIS — N179 Acute kidney failure, unspecified: Secondary | ICD-10-CM | POA: Diagnosis not present

## 2024-04-29 DIAGNOSIS — Z86711 Personal history of pulmonary embolism: Secondary | ICD-10-CM | POA: Diagnosis not present

## 2024-04-29 DIAGNOSIS — Z604 Social exclusion and rejection: Secondary | ICD-10-CM | POA: Diagnosis not present

## 2024-04-29 DIAGNOSIS — I4819 Other persistent atrial fibrillation: Secondary | ICD-10-CM | POA: Diagnosis not present

## 2024-04-29 DIAGNOSIS — Z556 Problems related to health literacy: Secondary | ICD-10-CM | POA: Diagnosis not present

## 2024-04-29 DIAGNOSIS — E43 Unspecified severe protein-calorie malnutrition: Secondary | ICD-10-CM | POA: Diagnosis not present

## 2024-04-29 DIAGNOSIS — Z7901 Long term (current) use of anticoagulants: Secondary | ICD-10-CM | POA: Diagnosis not present

## 2024-04-29 DIAGNOSIS — M1990 Unspecified osteoarthritis, unspecified site: Secondary | ICD-10-CM | POA: Diagnosis not present

## 2024-04-29 DIAGNOSIS — I110 Hypertensive heart disease with heart failure: Secondary | ICD-10-CM | POA: Diagnosis not present

## 2024-04-29 DIAGNOSIS — J9601 Acute respiratory failure with hypoxia: Secondary | ICD-10-CM | POA: Diagnosis not present

## 2024-04-29 DIAGNOSIS — I50811 Acute right heart failure: Secondary | ICD-10-CM | POA: Diagnosis not present

## 2024-04-29 DIAGNOSIS — E559 Vitamin D deficiency, unspecified: Secondary | ICD-10-CM | POA: Diagnosis not present

## 2024-04-29 DIAGNOSIS — Z7984 Long term (current) use of oral hypoglycemic drugs: Secondary | ICD-10-CM | POA: Diagnosis not present

## 2024-04-29 DIAGNOSIS — I5042 Chronic combined systolic (congestive) and diastolic (congestive) heart failure: Secondary | ICD-10-CM | POA: Diagnosis not present

## 2024-04-29 DIAGNOSIS — D6869 Other thrombophilia: Secondary | ICD-10-CM | POA: Diagnosis not present

## 2024-04-29 DIAGNOSIS — Z9981 Dependence on supplemental oxygen: Secondary | ICD-10-CM | POA: Diagnosis not present

## 2024-04-29 DIAGNOSIS — H409 Unspecified glaucoma: Secondary | ICD-10-CM | POA: Diagnosis not present

## 2024-04-29 DIAGNOSIS — E039 Hypothyroidism, unspecified: Secondary | ICD-10-CM | POA: Diagnosis not present

## 2024-05-06 ENCOUNTER — Telehealth: Payer: Self-pay | Admitting: Hematology and Oncology

## 2024-05-06 DIAGNOSIS — I2510 Atherosclerotic heart disease of native coronary artery without angina pectoris: Secondary | ICD-10-CM | POA: Diagnosis not present

## 2024-05-06 DIAGNOSIS — I4819 Other persistent atrial fibrillation: Secondary | ICD-10-CM | POA: Diagnosis not present

## 2024-05-06 DIAGNOSIS — N39498 Other specified urinary incontinence: Secondary | ICD-10-CM | POA: Diagnosis not present

## 2024-05-06 DIAGNOSIS — Z556 Problems related to health literacy: Secondary | ICD-10-CM | POA: Diagnosis not present

## 2024-05-06 DIAGNOSIS — D6869 Other thrombophilia: Secondary | ICD-10-CM | POA: Diagnosis not present

## 2024-05-06 DIAGNOSIS — I5042 Chronic combined systolic (congestive) and diastolic (congestive) heart failure: Secondary | ICD-10-CM | POA: Diagnosis not present

## 2024-05-06 DIAGNOSIS — Z7984 Long term (current) use of oral hypoglycemic drugs: Secondary | ICD-10-CM | POA: Diagnosis not present

## 2024-05-06 DIAGNOSIS — Z6822 Body mass index (BMI) 22.0-22.9, adult: Secondary | ICD-10-CM | POA: Diagnosis not present

## 2024-05-06 DIAGNOSIS — H409 Unspecified glaucoma: Secondary | ICD-10-CM | POA: Diagnosis not present

## 2024-05-06 DIAGNOSIS — E039 Hypothyroidism, unspecified: Secondary | ICD-10-CM | POA: Diagnosis not present

## 2024-05-06 DIAGNOSIS — J9601 Acute respiratory failure with hypoxia: Secondary | ICD-10-CM | POA: Diagnosis not present

## 2024-05-06 DIAGNOSIS — I272 Pulmonary hypertension, unspecified: Secondary | ICD-10-CM | POA: Diagnosis not present

## 2024-05-06 DIAGNOSIS — Z7901 Long term (current) use of anticoagulants: Secondary | ICD-10-CM | POA: Diagnosis not present

## 2024-05-06 DIAGNOSIS — Z9981 Dependence on supplemental oxygen: Secondary | ICD-10-CM | POA: Diagnosis not present

## 2024-05-06 DIAGNOSIS — I110 Hypertensive heart disease with heart failure: Secondary | ICD-10-CM | POA: Diagnosis not present

## 2024-05-06 DIAGNOSIS — I50811 Acute right heart failure: Secondary | ICD-10-CM | POA: Diagnosis not present

## 2024-05-06 DIAGNOSIS — M1990 Unspecified osteoarthritis, unspecified site: Secondary | ICD-10-CM | POA: Diagnosis not present

## 2024-05-06 DIAGNOSIS — N179 Acute kidney failure, unspecified: Secondary | ICD-10-CM | POA: Diagnosis not present

## 2024-05-06 DIAGNOSIS — E43 Unspecified severe protein-calorie malnutrition: Secondary | ICD-10-CM | POA: Diagnosis not present

## 2024-05-06 DIAGNOSIS — Z86711 Personal history of pulmonary embolism: Secondary | ICD-10-CM | POA: Diagnosis not present

## 2024-05-06 DIAGNOSIS — J849 Interstitial pulmonary disease, unspecified: Secondary | ICD-10-CM | POA: Diagnosis not present

## 2024-05-06 DIAGNOSIS — E559 Vitamin D deficiency, unspecified: Secondary | ICD-10-CM | POA: Diagnosis not present

## 2024-05-06 DIAGNOSIS — Z604 Social exclusion and rejection: Secondary | ICD-10-CM | POA: Diagnosis not present

## 2024-05-06 DIAGNOSIS — N401 Enlarged prostate with lower urinary tract symptoms: Secondary | ICD-10-CM | POA: Diagnosis not present

## 2024-05-07 DIAGNOSIS — I272 Pulmonary hypertension, unspecified: Secondary | ICD-10-CM | POA: Diagnosis not present

## 2024-05-07 DIAGNOSIS — J849 Interstitial pulmonary disease, unspecified: Secondary | ICD-10-CM | POA: Diagnosis not present

## 2024-05-07 DIAGNOSIS — Z6822 Body mass index (BMI) 22.0-22.9, adult: Secondary | ICD-10-CM | POA: Diagnosis not present

## 2024-05-07 DIAGNOSIS — E559 Vitamin D deficiency, unspecified: Secondary | ICD-10-CM | POA: Diagnosis not present

## 2024-05-07 DIAGNOSIS — Z86711 Personal history of pulmonary embolism: Secondary | ICD-10-CM | POA: Diagnosis not present

## 2024-05-07 DIAGNOSIS — I2510 Atherosclerotic heart disease of native coronary artery without angina pectoris: Secondary | ICD-10-CM | POA: Diagnosis not present

## 2024-05-07 DIAGNOSIS — J9601 Acute respiratory failure with hypoxia: Secondary | ICD-10-CM | POA: Diagnosis not present

## 2024-05-07 DIAGNOSIS — M1990 Unspecified osteoarthritis, unspecified site: Secondary | ICD-10-CM | POA: Diagnosis not present

## 2024-05-07 DIAGNOSIS — Z7901 Long term (current) use of anticoagulants: Secondary | ICD-10-CM | POA: Diagnosis not present

## 2024-05-07 DIAGNOSIS — N401 Enlarged prostate with lower urinary tract symptoms: Secondary | ICD-10-CM | POA: Diagnosis not present

## 2024-05-07 DIAGNOSIS — I50811 Acute right heart failure: Secondary | ICD-10-CM | POA: Diagnosis not present

## 2024-05-07 DIAGNOSIS — N39498 Other specified urinary incontinence: Secondary | ICD-10-CM | POA: Diagnosis not present

## 2024-05-07 DIAGNOSIS — N179 Acute kidney failure, unspecified: Secondary | ICD-10-CM | POA: Diagnosis not present

## 2024-05-07 DIAGNOSIS — H409 Unspecified glaucoma: Secondary | ICD-10-CM | POA: Diagnosis not present

## 2024-05-07 DIAGNOSIS — Z604 Social exclusion and rejection: Secondary | ICD-10-CM | POA: Diagnosis not present

## 2024-05-07 DIAGNOSIS — I4819 Other persistent atrial fibrillation: Secondary | ICD-10-CM | POA: Diagnosis not present

## 2024-05-07 DIAGNOSIS — E43 Unspecified severe protein-calorie malnutrition: Secondary | ICD-10-CM | POA: Diagnosis not present

## 2024-05-07 DIAGNOSIS — I5042 Chronic combined systolic (congestive) and diastolic (congestive) heart failure: Secondary | ICD-10-CM | POA: Diagnosis not present

## 2024-05-07 DIAGNOSIS — Z7984 Long term (current) use of oral hypoglycemic drugs: Secondary | ICD-10-CM | POA: Diagnosis not present

## 2024-05-07 DIAGNOSIS — Z9981 Dependence on supplemental oxygen: Secondary | ICD-10-CM | POA: Diagnosis not present

## 2024-05-07 DIAGNOSIS — D6869 Other thrombophilia: Secondary | ICD-10-CM | POA: Diagnosis not present

## 2024-05-07 DIAGNOSIS — Z556 Problems related to health literacy: Secondary | ICD-10-CM | POA: Diagnosis not present

## 2024-05-07 DIAGNOSIS — E039 Hypothyroidism, unspecified: Secondary | ICD-10-CM | POA: Diagnosis not present

## 2024-05-07 DIAGNOSIS — I110 Hypertensive heart disease with heart failure: Secondary | ICD-10-CM | POA: Diagnosis not present

## 2024-05-07 NOTE — Telephone Encounter (Signed)
 Copied from CRM [id number]. Topic: Clinical - Medical Advice >> May 07, 2024  9:44 AM Harlene ORN wrote: Reason for CRM: Care taker called to ask if the patient can be upped to two liters of oxygen when he exercises  Phone: [phone]

## 2024-05-08 ENCOUNTER — Telehealth (HOSPITAL_COMMUNITY): Payer: Self-pay | Admitting: Cardiology

## 2024-05-08 NOTE — Telephone Encounter (Signed)
 Called to confirm/remind patient of their appointment at the [facility] on 05/08/2024.   Appointment:   [] Confirmed  [x] Left mess   [] No answer/No voice mail  [] VM Full/unable to leave message  [] Phone not in service  Patient reminded to bring all medications and/or complete list.  Confirmed patient has transportation. Gave directions, instructed to utilize valet parking.

## 2024-05-11 ENCOUNTER — Inpatient Hospital Stay: Admitting: Hematology and Oncology

## 2024-05-11 ENCOUNTER — Ambulatory Visit (HOSPITAL_COMMUNITY): Payer: Self-pay | Admitting: Cardiology

## 2024-05-11 ENCOUNTER — Telehealth: Payer: Self-pay

## 2024-05-11 ENCOUNTER — Encounter (HOSPITAL_COMMUNITY): Payer: Self-pay | Admitting: Cardiology

## 2024-05-11 ENCOUNTER — Ambulatory Visit (HOSPITAL_COMMUNITY)
Admission: RE | Admit: 2024-05-11 | Discharge: 2024-05-11 | Disposition: A | Discharge status: Home / Self Care | Source: Ambulatory Visit | Attending: Cardiology | Admitting: Cardiology

## 2024-05-11 ENCOUNTER — Inpatient Hospital Stay

## 2024-05-11 VITALS — BP 154/78 | HR 51 | Ht 73.0 in

## 2024-05-11 DIAGNOSIS — I5022 Chronic systolic (congestive) heart failure: Secondary | ICD-10-CM | POA: Diagnosis not present

## 2024-05-11 DIAGNOSIS — I5020 Unspecified systolic (congestive) heart failure: Secondary | ICD-10-CM

## 2024-05-11 DIAGNOSIS — Z7901 Long term (current) use of anticoagulants: Secondary | ICD-10-CM | POA: Insufficient documentation

## 2024-05-11 DIAGNOSIS — I272 Pulmonary hypertension, unspecified: Secondary | ICD-10-CM | POA: Insufficient documentation

## 2024-05-11 DIAGNOSIS — J811 Chronic pulmonary edema: Secondary | ICD-10-CM | POA: Insufficient documentation

## 2024-05-11 DIAGNOSIS — J90 Pleural effusion, not elsewhere classified: Secondary | ICD-10-CM | POA: Diagnosis not present

## 2024-05-11 DIAGNOSIS — Z79899 Other long term (current) drug therapy: Secondary | ICD-10-CM | POA: Insufficient documentation

## 2024-05-11 DIAGNOSIS — J849 Interstitial pulmonary disease, unspecified: Secondary | ICD-10-CM | POA: Diagnosis not present

## 2024-05-11 DIAGNOSIS — J948 Other specified pleural conditions: Secondary | ICD-10-CM | POA: Insufficient documentation

## 2024-05-11 DIAGNOSIS — I4891 Unspecified atrial fibrillation: Secondary | ICD-10-CM | POA: Insufficient documentation

## 2024-05-11 DIAGNOSIS — E039 Hypothyroidism, unspecified: Secondary | ICD-10-CM | POA: Insufficient documentation

## 2024-05-11 DIAGNOSIS — Z7984 Long term (current) use of oral hypoglycemic drugs: Secondary | ICD-10-CM | POA: Insufficient documentation

## 2024-05-11 LAB — TSH: TSH: 31.098 u[IU]/mL — ABNORMAL HIGH (ref 0.350–4.500)

## 2024-05-11 LAB — T4, FREE: Free T4: 0.78 ng/dL (ref 0.61–1.12)

## 2024-05-11 NOTE — Progress Notes (Incomplete)
 ADVANCED HEART FAILURE FOLLOW UP CLINIC NOTE  Referring Physician: Kip Righter, MD  Primary Care: Kip Righter, MD Primary Cardiologist:  HPI: Samuel Porter is a 83 y.o. male who presents for follow up of chronic heart failure with mid range EF and pulmonary hypertension.      Patient with a long and complex presentation.  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  He did not receive any radiation.  Recurrent admission for acute on chronic hypoxic respiratory failure with pleural effusions.    Advanced Heart Failure consulted d/t RV failure and pulmonary hypertension. Echo with EF 30-35%, RV severely reduced, severely elevated RVSP at 70 mmHg, moderate TR. He was diuresed. RHC showed normal filling pressures, preserved CO and borderline elevated PA pressure. cMRI with LVEF 51% and no evidence of cardiac amyloidosis. However did have myeloma panel with monoclonal IgG. Prior to discharge he underwent DCCV to SR.    He called the clinic a week ago to report recurrent Afib on his apple watch. Amiodarone  increased to 200 mg BID.   He saw Heme/Onc and is undergoing further workup to rule out amyloidosis.     SUBJECTIVE:  Overall doing somewhat better on current therapy. He has been walking around more, though remains short of breath with more than mild exertion. He remains in NSR currently. He is undergoing amyloid workup with oncology.   PMH, current medications, allergies, social history, and family history reviewed in epic.  PHYSICAL EXAM: Vitals:   05/11/24 0949  BP: (!) 154/78  Pulse: (!) 51  SpO2: 94%   GENERAL: frail, oxygen on PULM:  Normal work of breathing, clear to auscultation bilaterally. Respirations are unlabored.  CARDIAC:  JVP: flat         bradycardic rate with regular rhythm. No murmurs, rubs or gallops.  trace edema with stockings on. Warm and well perfused  extremities. ABDOMEN: Soft, non-tender, non-distended. NEUROLOGIC: Patient is oriented x3 with no focal or lateralizing neurologic deficits.    DATA REVIEW  ECG: 04/24/2024: Sinus bradycardia, anterolateral ischemia    ECHO: 12/19/2023: LVEF 35-40%, RV function moderately reduced, LA severely dilated, small pericardial effusion  CATH: 03/23/2024: RA 4, PA 34/18 (25), PCWP 13, Fick CO/CI 4.59/2.34   CMR:  03/26/2024: LVEF 51%, RVEF 47%, moderately dilated RV, severe biatrial enlargement, RV insertion LGE, elevated global T2   ASSESSMENT & PLAN:  Heart failure with improved EF: May be related to underlying atrial fibrillation, LVEF 51% on recent CMR. Currently in NSR, echo atypical for amyloid involvement, CMR inconsistent as well.  - Off Midodrine , BP stable.  -Continue lasix  20mg  daily. Volume okay today. Has lower extremity edema which is multifactorial. Note albumin  was 2.3 on 06/03. Continue compression stockings. - Continue farxiga  10mg  daily  - Off digoxin  with bradycardia.   Pulmonary hypertension: Noted on echocardiogram, improved on RHC as above. Suspect primarily group III, could consider repeat if he worsens in the future and consider inhaled tyvaso. - Diuresis as above   Right pleural effusion: Chronic/recurrent.  Multiple thoracenteses, fluid cytology has been negative.  Now with trapped lung. RHC 6/9 showed normal filling pressures, do not think that CHF is currently driving his effusion. CXR with persistent hydropneumothorax on right, Pulm edema improving.  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  Pleural fluid cytology has been negative. S/p immunotherapy (cemiplimab ).  Atrial fibrillation: Now back in NSR. Amiodarone  not ideal long term but continue for now that he remains in NSR. Follow up with afib clinic. Poor ablation candidate. - Continue eliquis  5 mg bid   Hypothyroidism:  - Noted during previous  hospitalization, has been on treatment for over 6 weeks. 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   I spent 45 minutes caring for this patient today including face to face time, ordering and reviewing labs, reviewing records from recent hospitalization, family discussion, seeing the patient, documenting in the record, and arranging follow ups.   Morene Brownie, MD Advanced Heart Failure Mechanical Circulatory Support 05/11/24

## 2024-05-11 NOTE — Telephone Encounter (Signed)
 Left message to confirm appts for 7/29

## 2024-05-11 NOTE — Patient Instructions (Signed)
 There has been no changes to your medications.  Labs done today, your results will be available in MyChart, we will contact you for abnormal readings.  Your physician recommends that you schedule a follow-up appointment in: 3 months ( November) ** PLEASE CALL THE OFFICE IN SEPTEMBER TO ARRANGE YOUR FOLLOW UP APPOINTMENT.**  If you have any questions or concerns before your next appointment please send us  a message through [place] or call our office at [phone].    TO LEAVE A MESSAGE FOR THE NURSE SELECT OPTION 2, PLEASE LEAVE A MESSAGE INCLUDING: YOUR NAME DATE OF BIRTH CALL BACK NUMBER REASON FOR CALL**this is important as we prioritize the call backs  YOU WILL RECEIVE A CALL BACK THE SAME DAY AS LONG AS YOU CALL BEFORE 4:00 PM At the [facility], you and your health needs are our priority. As part of our continuing mission to provide you with exceptional heart care, we have created designated Provider Care Teams. These Care Teams include your primary Cardiologist (physician) and Advanced Practice Providers (APPs- Physician Assistants and Nurse Practitioners) who all work together to provide you with the care you need, when you need it.   You may see any of the following providers on your designated Care Team at your next follow up: Dr Toribio Fuel Dr Ezra Shuck Dr. Ria Commander Dr. Morene Brownie Amy Lenetta, NP Caffie Shed, [place] [facility] Tiger, [place] Beckey Coe, NP [place] Lee, NP Ellouise Class, NP Tinnie Redman, PharmD Jaun Bash, PharmD   Please be sure to bring in all your medications bottles to every appointment.    Thank you for choosing [facility]

## 2024-05-12 ENCOUNTER — Inpatient Hospital Stay: Admitting: Hematology and Oncology

## 2024-05-12 ENCOUNTER — Inpatient Hospital Stay: Attending: Hematology and Oncology

## 2024-05-12 VITALS — BP 152/64 | HR 47 | Temp 98.4°F | Resp 19 | Wt 154.3 lb

## 2024-05-12 DIAGNOSIS — Z7901 Long term (current) use of anticoagulants: Secondary | ICD-10-CM | POA: Diagnosis not present

## 2024-05-12 DIAGNOSIS — Z85818 Personal history of malignant neoplasm of other sites of lip, oral cavity, and pharynx: Secondary | ICD-10-CM | POA: Insufficient documentation

## 2024-05-12 DIAGNOSIS — Z87891 Personal history of nicotine dependence: Secondary | ICD-10-CM | POA: Diagnosis not present

## 2024-05-12 DIAGNOSIS — D472 Monoclonal gammopathy: Secondary | ICD-10-CM

## 2024-05-12 DIAGNOSIS — I272 Pulmonary hypertension, unspecified: Secondary | ICD-10-CM | POA: Diagnosis not present

## 2024-05-12 DIAGNOSIS — I10 Essential (primary) hypertension: Secondary | ICD-10-CM | POA: Insufficient documentation

## 2024-05-12 DIAGNOSIS — I110 Hypertensive heart disease with heart failure: Secondary | ICD-10-CM | POA: Diagnosis not present

## 2024-05-12 DIAGNOSIS — E039 Hypothyroidism, unspecified: Secondary | ICD-10-CM | POA: Insufficient documentation

## 2024-05-12 DIAGNOSIS — Z6822 Body mass index (BMI) 22.0-22.9, adult: Secondary | ICD-10-CM | POA: Diagnosis not present

## 2024-05-12 DIAGNOSIS — I50811 Acute right heart failure: Secondary | ICD-10-CM | POA: Diagnosis not present

## 2024-05-12 DIAGNOSIS — I5042 Chronic combined systolic (congestive) and diastolic (congestive) heart failure: Secondary | ICD-10-CM | POA: Diagnosis not present

## 2024-05-12 DIAGNOSIS — N39498 Other specified urinary incontinence: Secondary | ICD-10-CM | POA: Diagnosis not present

## 2024-05-12 DIAGNOSIS — D6869 Other thrombophilia: Secondary | ICD-10-CM | POA: Diagnosis not present

## 2024-05-12 DIAGNOSIS — E43 Unspecified severe protein-calorie malnutrition: Secondary | ICD-10-CM | POA: Diagnosis not present

## 2024-05-12 DIAGNOSIS — N179 Acute kidney failure, unspecified: Secondary | ICD-10-CM | POA: Diagnosis not present

## 2024-05-12 DIAGNOSIS — Z86711 Personal history of pulmonary embolism: Secondary | ICD-10-CM | POA: Diagnosis not present

## 2024-05-12 DIAGNOSIS — Z79899 Other long term (current) drug therapy: Secondary | ICD-10-CM | POA: Insufficient documentation

## 2024-05-12 DIAGNOSIS — N401 Enlarged prostate with lower urinary tract symptoms: Secondary | ICD-10-CM | POA: Diagnosis not present

## 2024-05-12 DIAGNOSIS — I4819 Other persistent atrial fibrillation: Secondary | ICD-10-CM | POA: Diagnosis not present

## 2024-05-12 DIAGNOSIS — Z7984 Long term (current) use of oral hypoglycemic drugs: Secondary | ICD-10-CM | POA: Diagnosis not present

## 2024-05-12 DIAGNOSIS — H409 Unspecified glaucoma: Secondary | ICD-10-CM | POA: Diagnosis not present

## 2024-05-12 DIAGNOSIS — J9601 Acute respiratory failure with hypoxia: Secondary | ICD-10-CM | POA: Diagnosis not present

## 2024-05-12 DIAGNOSIS — Z604 Social exclusion and rejection: Secondary | ICD-10-CM | POA: Diagnosis not present

## 2024-05-12 DIAGNOSIS — J849 Interstitial pulmonary disease, unspecified: Secondary | ICD-10-CM | POA: Diagnosis not present

## 2024-05-12 DIAGNOSIS — Z556 Problems related to health literacy: Secondary | ICD-10-CM | POA: Diagnosis not present

## 2024-05-12 DIAGNOSIS — I2510 Atherosclerotic heart disease of native coronary artery without angina pectoris: Secondary | ICD-10-CM | POA: Diagnosis not present

## 2024-05-12 DIAGNOSIS — Z9981 Dependence on supplemental oxygen: Secondary | ICD-10-CM | POA: Diagnosis not present

## 2024-05-12 DIAGNOSIS — M1990 Unspecified osteoarthritis, unspecified site: Secondary | ICD-10-CM | POA: Diagnosis not present

## 2024-05-12 DIAGNOSIS — E559 Vitamin D deficiency, unspecified: Secondary | ICD-10-CM | POA: Diagnosis not present

## 2024-05-12 LAB — CBC WITH DIFFERENTIAL/PLATELET
Abs Immature Granulocytes: 0.04 K/uL (ref 0.00–0.07)
Basophils Absolute: 0 K/uL (ref 0.0–0.1)
Basophils Relative: 0 %
Eosinophils Absolute: 0.1 K/uL (ref 0.0–0.5)
Eosinophils Relative: 2 %
HCT: 38.5 % — ABNORMAL LOW (ref 39.0–52.0)
Hemoglobin: 11.8 g/dL — ABNORMAL LOW (ref 13.0–17.0)
Immature Granulocytes: 1 %
Lymphocytes Relative: 9 %
Lymphs Abs: 0.5 K/uL — ABNORMAL LOW (ref 0.7–4.0)
MCH: 28.5 pg (ref 26.0–34.0)
MCHC: 30.6 g/dL (ref 30.0–36.0)
MCV: 93 fL (ref 80.0–100.0)
Monocytes Absolute: 1.1 K/uL — ABNORMAL HIGH (ref 0.1–1.0)
Monocytes Relative: 21 %
Neutro Abs: 3.7 K/uL (ref 1.7–7.7)
Neutrophils Relative %: 67 %
Platelets: 165 K/uL (ref 150–400)
RBC: 4.14 MIL/uL — ABNORMAL LOW (ref 4.22–5.81)
RDW: 15.4 % (ref 11.5–15.5)
WBC: 5.5 K/uL (ref 4.0–10.5)
nRBC: 0 % (ref 0.0–0.2)

## 2024-05-12 LAB — CMP (CANCER CENTER ONLY)
ALT: 19 U/L (ref 0–44)
AST: 25 U/L (ref 15–41)
Albumin: 2.9 g/dL — ABNORMAL LOW (ref 3.5–5.0)
Alkaline Phosphatase: 146 U/L — ABNORMAL HIGH (ref 38–126)
Anion gap: 2 — ABNORMAL LOW (ref 5–15)
BUN: 28 mg/dL — ABNORMAL HIGH (ref 8–23)
CO2: 36 mmol/L — ABNORMAL HIGH (ref 22–32)
Calcium: 8.9 mg/dL (ref 8.9–10.3)
Chloride: 100 mmol/L (ref 98–111)
Creatinine: 1.02 mg/dL (ref 0.61–1.24)
GFR, Estimated: >60 mL/min (ref >60)
Glucose, Bld: 104 mg/dL — ABNORMAL HIGH (ref 70–99)
Potassium: 4.1 mmol/L (ref 3.5–5.1)
Sodium: 138 mmol/L (ref 135–145)
Total Bilirubin: 0.7 mg/dL (ref 0.0–1.2)
Total Protein: 7.2 g/dL (ref 6.5–8.1)

## 2024-05-12 LAB — T3, FREE: T3, Free: 2.1 pg/mL (ref 2.0–4.4)

## 2024-05-12 NOTE — Progress Notes (Signed)
 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  He elected to be treated at [facility].  He was most recently admitted with lateral lower extremity edema, pleural effusion which was thought to be transudative and he is referred back to hematology for evaluation of possible amyloidosis.  Assessment and Plan Assessment & Plan Suspected amyloidosis, less likely Suspected amyloidosis remains a differential diagnosis. Awaiting blood work results to decide on bone marrow biopsy. - Await blood work results, expected by Friday or next Monday. - Call him with results and discuss whether to proceed with bone marrow biopsy. - If bone marrow biopsy is needed, perform under CT guidance.  Hypothyroidism Uncontrolled hypothyroidism with high TSH, normal free T4. Managed by Dr. Righter Kip. - Contact Dr. Righter Kip to discuss thyroid  management.   HISTORY OF PRESENTING ILLNESS:   EULA MAZZOLA 83 y.o. 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  He is here today with his daughter and another daughter was on the phone.  Discussed the use of AI scribe software for clinical note transcription with the patient, who gave verbal consent to proceed.  History of Present Illness  SHEAN GERDING is an 83 year old male with suspected amyloidosis who presents for follow-up on his condition. He is accompanied by his daughter, Rollo, and his son-in-law.  He has experienced significant weight loss, which has stabilized recently, with only a slight decrease of about a pound since the last visit. His appetite is improving, and he is eating better.  He has been experiencing leg swelling, which has improved since the last visit. He attributes this improvement to wearing compression stockings daily. He does not sleep with them on, and upon waking, his legs are not significantly swollen.  There is a concern regarding his thyroid  function. His thyroid  was checked recently, and it remains uncontrolled. He is currently taking 50 micrograms of levothyroxine . His TSH is elevated, but his free T4 is within the normal range.  He is on oxygen therapy, maintaining at two liters, and reports no significant changes in his breathing.  Rest of the pertinent systems were reviewed with the patient and are negative.  MEDICAL HISTORY:  Past Medical History:  Diagnosis Date   Arthritis    oa   BPH (benign prostatic hyperplasia)    Cancer (HCC) 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   Cataracts, bilateral    Glaucoma    Hypertension    Vitamin D  deficiency     SURGICAL HISTORY: Past Surgical History:  Procedure Laterality Date   CARDIOVERSION N/A 03/05/2024   Procedure: CARDIOVERSION;  Surgeon: Mona Vinie BROCKS, Samuel;  Location: [facility] INVASIVE CV LAB;  Service: Cardiovascular;  Laterality: N/A;   CARDIOVERSION N/A 03/24/2024   Procedure: CARDIOVERSION;  Surgeon: Rolan Ezra RAMAN, Samuel;  Location: [facility]  INVASIVE CV LAB;  Service: Cardiovascular;  Laterality: N/A;   CARDIOVERSION N/A 04/24/2024   Procedure: CARDIOVERSION;  Surgeon: Okey Vina GAILS, Samuel;  Location: [facility] INVASIVE CV LAB;  Service: Cardiovascular;  Laterality: N/A;   CHEST TUBE INSERTION Right 03/18/2024   Procedure: CHEST TUBE INSERTION;  Surgeon: Annella Donnice SAUNDERS, Samuel;  Location: [facility] ENDOSCOPY;  Service: Pulmonary;  Laterality: Right;   CHOLECYSTECTOMY     colonscopy  2017   IR THORACENTESIS ASP PLEURAL SPACE W/IMG GUIDE  03/03/2024   JOINT REPLACEMENT Right    hip    RIGHT HEART CATH N/A 03/23/2024   Procedure: RIGHT HEART CATH;  Surgeon: Rolan Ezra RAMAN, Samuel;  Location: [facility] INVASIVE CV LAB;  Service: Cardiovascular;  Laterality: N/A;   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   Procedure: LEFT TOTAL HIP ARTHROPLASTY ANTERIOR APPROACH;  Surgeon: Dempsey Moan, Samuel;  Location: [facility] ORS;  Service: Orthopedics;  Laterality: Left;   VENTRAL HERNIA REPAIR N/A 07/12/2023   Procedure: HERNIA REPAIR VENTRAL;  Surgeon: Stechschulte, Deward PARAS, Samuel;  Location: [facility] OR;  Service: General;  Laterality: N/A;    SOCIAL HISTORY: Social History   Socioeconomic History   Marital status: Married    Spouse name: Not on file   Number of children: Not on file   Years of education: Not on file   Highest education level: Not on file  Occupational History   Not on file  Tobacco Use   Smoking status: Former    Current packs/day: 0.00    Average packs/day: 1.5 packs/day for 10.0 years (15.0 ttl pk-yrs)    Types: Cigarettes    Start date: 67    Quit date: 14    Years since quitting: 55.6   Smokeless tobacco: Never  Substance and Sexual Activity   Alcohol use: Not Currently    Comment: 1-2 glass wine per day   Drug use: No   Sexual activity: Not on file  Other Topics Concern   Not on file  Social History Narrative   Not on file   Social Drivers of Health   Financial Resource Strain: Not on file  Food Insecurity: Patient Declined (03/17/2024)    Hunger Vital Sign    Worried About Running Out of Food in the Last Year: Patient declined    Ran Out of Food in the Last Year: Patient declined  Transportation Needs: No Transportation Needs (02/04/2024)   [facility]   PRAPARE - Transportation    Lack of Transportation (Medical): No    Lack of Transportation (Non-Medical): No  Physical Activity: Not on file  Stress: Not on file  Social Connections: Not on file  Intimate Partner Violence: Not At Risk (11/30/2022)   Humiliation, Afraid, Rape, and Kick questionnaire    Fear of Current or Ex-Partner: No    Emotionally Abused: No    Physically Abused: No    Sexually Abused: No    FAMILY HISTORY: Family History  Problem Relation Age of Onset   Cancer - Ovarian Sister    Lymphoma Sister     ALLERGIES:  has no known allergies.  MEDICATIONS:  Current Outpatient Medications  Medication Sig Dispense Refill   acetaminophen  (TYLENOL ) 325 MG tablet Take 650 mg by mouth daily as needed for moderate pain (pain score 4-6)  or mild pain (pain score 1-3).     albuterol  (VENTOLIN  HFA) 108 (90 Base) MCG/ACT inhaler Inhale 2 puffs into the lungs every 4 (four) hours as needed for shortness of breath.     amiodarone  (PACERONE ) 200 MG tablet Take 1 tablet (200 mg total) by mouth 2 (two) times daily. 60 tablet 3   cholecalciferol  (VITAMIN D ) 1000 units tablet Take 1,000 Units by mouth daily.     dapagliflozin  propanediol (FARXIGA ) 10 MG TABS tablet Take 1 tablet (10 mg total) by mouth daily before breakfast. 30 tablet 11   docusate sodium  (COLACE) 100 MG capsule Take 1 capsule (100 mg total) by mouth 2 (two) times daily as needed for mild constipation.     dorzolamide -timolol  (COSOPT ) 22.3-6.8 MG/ML ophthalmic solution Place 1 drop into both eyes 2 (two) times daily.     ELIQUIS  5 MG TABS tablet Take 5 mg by mouth 2 (two) times daily.     feeding supplement (BOOST HIGH PROTEIN) LIQD Take 1 Container by mouth daily.     feeding  supplement (ENSURE PLUS HIGH PROTEIN) LIQD Take 237 mLs by mouth 3 (three) times daily between meals.     finasteride  (PROSCAR ) 5 MG tablet Take 5 mg by mouth daily.     furosemide  (LASIX ) 20 MG tablet Take 1 tablet (20 mg total) by mouth daily. 90 tablet 3   levothyroxine  (SYNTHROID ) 50 MCG tablet Take 1 tablet (50 mcg total) by mouth daily at 6 (six) AM. 30 tablet 0   mirtazapine  (REMERON ) 7.5 MG tablet Take 1 tablet (7.5 mg total) by mouth at bedtime. 30 tablet 0   Multiple Vitamin (MULTIVITAMIN WITH MINERALS) TABS tablet Take 1 tablet by mouth daily. 30 tablet 0   Nutritional Supplements (,FEEDING SUPPLEMENT, PROSOURCE PLUS) liquid Take 30 mLs by mouth 2 (two) times daily before lunch and supper. 887 mL 0   polyethylene glycol (MIRALAX  / GLYCOLAX ) 17 g packet Take 17 g by mouth 2 (two) times daily as needed.     sildenafil (VIAGRA) 25 MG tablet Take 25 mg by mouth as needed for erectile dysfunction.     No current facility-administered medications for this visit.     PHYSICAL EXAMINATION: ECOG PERFORMANCE STATUS: 0 - Asymptomatic  He appears well, on oxygen Neck: no palpable LN Chest : CTA bilaterally RRR No LE edema, on elastic stockings.  LABORATORY DATA:  I have reviewed the data as listed Lab Results  Component Value Date   WBC 5.5 05/12/2024   HGB 11.8 (L) 05/12/2024   HCT 38.5 (L) 05/12/2024   MCV 93.0 05/12/2024   PLT 165 05/12/2024     Chemistry      Component Value Date/Time   NA 139 04/13/2024 1245   K 5.1 04/13/2024 1245   CL 100 04/13/2024 1245   CO2 32 (H) 04/13/2024 1245   BUN 27 04/13/2024 1245   CREATININE 1.02 04/13/2024 1245   CREATININE 0.91 11/01/2022 0954      Component Value Date/Time   CALCIUM 9.0 04/13/2024 1245   ALKPHOS 83 03/17/2024 0338   AST 16 03/17/2024 0338   AST 17 11/01/2022 0954   ALT 17 03/17/2024 0338   ALT 16 11/01/2022 0954   BILITOT 0.8 03/17/2024 0338   BILITOT 1.1 11/01/2022 0954       RADIOGRAPHIC STUDIES: I have  personally reviewed the radiological images as listed and agreed with the findings in the report. EP STUDY Result Date: 04/24/2024 See surgical note for result.  Amber Stalls, Samuel 05/12/2024 1:33 PM

## 2024-05-13 ENCOUNTER — Telehealth: Payer: Self-pay | Admitting: *Deleted

## 2024-05-13 DIAGNOSIS — J849 Interstitial pulmonary disease, unspecified: Secondary | ICD-10-CM | POA: Diagnosis not present

## 2024-05-13 DIAGNOSIS — Z9981 Dependence on supplemental oxygen: Secondary | ICD-10-CM | POA: Diagnosis not present

## 2024-05-13 DIAGNOSIS — I2510 Atherosclerotic heart disease of native coronary artery without angina pectoris: Secondary | ICD-10-CM | POA: Diagnosis not present

## 2024-05-13 DIAGNOSIS — N39498 Other specified urinary incontinence: Secondary | ICD-10-CM | POA: Diagnosis not present

## 2024-05-13 DIAGNOSIS — Z604 Social exclusion and rejection: Secondary | ICD-10-CM | POA: Diagnosis not present

## 2024-05-13 DIAGNOSIS — N401 Enlarged prostate with lower urinary tract symptoms: Secondary | ICD-10-CM | POA: Diagnosis not present

## 2024-05-13 DIAGNOSIS — E039 Hypothyroidism, unspecified: Secondary | ICD-10-CM | POA: Diagnosis not present

## 2024-05-13 DIAGNOSIS — E559 Vitamin D deficiency, unspecified: Secondary | ICD-10-CM | POA: Diagnosis not present

## 2024-05-13 DIAGNOSIS — Z556 Problems related to health literacy: Secondary | ICD-10-CM | POA: Diagnosis not present

## 2024-05-13 DIAGNOSIS — I50811 Acute right heart failure: Secondary | ICD-10-CM | POA: Diagnosis not present

## 2024-05-13 DIAGNOSIS — J90 Pleural effusion, not elsewhere classified: Secondary | ICD-10-CM | POA: Diagnosis not present

## 2024-05-13 DIAGNOSIS — I5042 Chronic combined systolic (congestive) and diastolic (congestive) heart failure: Secondary | ICD-10-CM | POA: Diagnosis not present

## 2024-05-13 DIAGNOSIS — Z86711 Personal history of pulmonary embolism: Secondary | ICD-10-CM | POA: Diagnosis not present

## 2024-05-13 DIAGNOSIS — Z7984 Long term (current) use of oral hypoglycemic drugs: Secondary | ICD-10-CM | POA: Diagnosis not present

## 2024-05-13 DIAGNOSIS — H409 Unspecified glaucoma: Secondary | ICD-10-CM | POA: Diagnosis not present

## 2024-05-13 DIAGNOSIS — I4819 Other persistent atrial fibrillation: Secondary | ICD-10-CM | POA: Diagnosis not present

## 2024-05-13 DIAGNOSIS — M1990 Unspecified osteoarthritis, unspecified site: Secondary | ICD-10-CM | POA: Diagnosis not present

## 2024-05-13 DIAGNOSIS — Z6822 Body mass index (BMI) 22.0-22.9, adult: Secondary | ICD-10-CM | POA: Diagnosis not present

## 2024-05-13 DIAGNOSIS — L089 Local infection of the skin and subcutaneous tissue, unspecified: Secondary | ICD-10-CM | POA: Diagnosis not present

## 2024-05-13 DIAGNOSIS — E43 Unspecified severe protein-calorie malnutrition: Secondary | ICD-10-CM | POA: Diagnosis not present

## 2024-05-13 DIAGNOSIS — R001 Bradycardia, unspecified: Secondary | ICD-10-CM | POA: Diagnosis not present

## 2024-05-13 DIAGNOSIS — I110 Hypertensive heart disease with heart failure: Secondary | ICD-10-CM | POA: Diagnosis not present

## 2024-05-13 DIAGNOSIS — I272 Pulmonary hypertension, unspecified: Secondary | ICD-10-CM | POA: Diagnosis not present

## 2024-05-13 DIAGNOSIS — D6869 Other thrombophilia: Secondary | ICD-10-CM | POA: Diagnosis not present

## 2024-05-13 DIAGNOSIS — Z7901 Long term (current) use of anticoagulants: Secondary | ICD-10-CM | POA: Diagnosis not present

## 2024-05-13 DIAGNOSIS — J9601 Acute respiratory failure with hypoxia: Secondary | ICD-10-CM | POA: Diagnosis not present

## 2024-05-13 DIAGNOSIS — N179 Acute kidney failure, unspecified: Secondary | ICD-10-CM | POA: Diagnosis not present

## 2024-05-13 LAB — KAPPA/LAMBDA LIGHT CHAINS
Kappa free light chain: 114.2 mg/L — ABNORMAL HIGH (ref 3.3–19.4)
Kappa, lambda light chain ratio: 2.33 — ABNORMAL HIGH (ref 0.26–1.65)
Lambda free light chains: 49.1 mg/L — ABNORMAL HIGH (ref 5.7–26.3)

## 2024-05-13 LAB — BETA 2 MICROGLOBULIN, SERUM: Beta-2 Microglobulin: 5.2 mg/L — ABNORMAL HIGH (ref 0.6–2.4)

## 2024-05-13 NOTE — Telephone Encounter (Signed)
 Per MD request most recent thyroid  lab results ordered by cardiology printed and faxed to Dr Kip.

## 2024-05-14 MED ORDER — LEVOTHYROXINE SODIUM 75 MCG PO TABS: 75.0000 ug | ORAL_TABLET | Freq: Every day | ORAL | 3 refills | Status: DC

## 2024-05-15 DIAGNOSIS — Z86711 Personal history of pulmonary embolism: Secondary | ICD-10-CM | POA: Diagnosis not present

## 2024-05-15 DIAGNOSIS — I110 Hypertensive heart disease with heart failure: Secondary | ICD-10-CM | POA: Diagnosis not present

## 2024-05-15 DIAGNOSIS — J849 Interstitial pulmonary disease, unspecified: Secondary | ICD-10-CM | POA: Diagnosis not present

## 2024-05-15 DIAGNOSIS — I5042 Chronic combined systolic (congestive) and diastolic (congestive) heart failure: Secondary | ICD-10-CM | POA: Diagnosis not present

## 2024-05-15 DIAGNOSIS — I50811 Acute right heart failure: Secondary | ICD-10-CM | POA: Diagnosis not present

## 2024-05-15 DIAGNOSIS — Z604 Social exclusion and rejection: Secondary | ICD-10-CM | POA: Diagnosis not present

## 2024-05-15 DIAGNOSIS — Z9981 Dependence on supplemental oxygen: Secondary | ICD-10-CM | POA: Diagnosis not present

## 2024-05-15 DIAGNOSIS — E039 Hypothyroidism, unspecified: Secondary | ICD-10-CM | POA: Diagnosis not present

## 2024-05-15 DIAGNOSIS — I4819 Other persistent atrial fibrillation: Secondary | ICD-10-CM | POA: Diagnosis not present

## 2024-05-15 DIAGNOSIS — E43 Unspecified severe protein-calorie malnutrition: Secondary | ICD-10-CM | POA: Diagnosis not present

## 2024-05-15 DIAGNOSIS — Z6822 Body mass index (BMI) 22.0-22.9, adult: Secondary | ICD-10-CM | POA: Diagnosis not present

## 2024-05-15 DIAGNOSIS — J9601 Acute respiratory failure with hypoxia: Secondary | ICD-10-CM | POA: Diagnosis not present

## 2024-05-15 DIAGNOSIS — H409 Unspecified glaucoma: Secondary | ICD-10-CM | POA: Diagnosis not present

## 2024-05-15 DIAGNOSIS — Z7901 Long term (current) use of anticoagulants: Secondary | ICD-10-CM | POA: Diagnosis not present

## 2024-05-15 DIAGNOSIS — M1990 Unspecified osteoarthritis, unspecified site: Secondary | ICD-10-CM | POA: Diagnosis not present

## 2024-05-15 DIAGNOSIS — N179 Acute kidney failure, unspecified: Secondary | ICD-10-CM | POA: Diagnosis not present

## 2024-05-15 DIAGNOSIS — I2510 Atherosclerotic heart disease of native coronary artery without angina pectoris: Secondary | ICD-10-CM | POA: Diagnosis not present

## 2024-05-15 DIAGNOSIS — I272 Pulmonary hypertension, unspecified: Secondary | ICD-10-CM | POA: Diagnosis not present

## 2024-05-15 DIAGNOSIS — Z7984 Long term (current) use of oral hypoglycemic drugs: Secondary | ICD-10-CM | POA: Diagnosis not present

## 2024-05-15 DIAGNOSIS — N39498 Other specified urinary incontinence: Secondary | ICD-10-CM | POA: Diagnosis not present

## 2024-05-15 DIAGNOSIS — N401 Enlarged prostate with lower urinary tract symptoms: Secondary | ICD-10-CM | POA: Diagnosis not present

## 2024-05-15 DIAGNOSIS — E559 Vitamin D deficiency, unspecified: Secondary | ICD-10-CM | POA: Diagnosis not present

## 2024-05-15 DIAGNOSIS — D6869 Other thrombophilia: Secondary | ICD-10-CM | POA: Diagnosis not present

## 2024-05-15 DIAGNOSIS — Z556 Problems related to health literacy: Secondary | ICD-10-CM | POA: Diagnosis not present

## 2024-05-18 ENCOUNTER — Inpatient Hospital Stay: Admitting: Hematology and Oncology

## 2024-05-19 ENCOUNTER — Encounter: Payer: Self-pay | Admitting: Cardiology

## 2024-05-19 ENCOUNTER — Ambulatory Visit: Attending: Cardiology | Admitting: Cardiology

## 2024-05-19 VITALS — BP 134/60 | HR 49 | Ht 73.0 in | Wt 155.0 lb

## 2024-05-19 DIAGNOSIS — D6869 Other thrombophilia: Secondary | ICD-10-CM

## 2024-05-19 DIAGNOSIS — I4819 Other persistent atrial fibrillation: Secondary | ICD-10-CM | POA: Diagnosis not present

## 2024-05-19 DIAGNOSIS — I5022 Chronic systolic (congestive) heart failure: Secondary | ICD-10-CM

## 2024-05-19 LAB — PROTEIN ELECTROPHORESIS, SERUM, WITH REFLEX
A/G Ratio: 0.6 — ABNORMAL LOW (ref 0.7–1.7)
Albumin ELP: 2.5 g/dL — ABNORMAL LOW (ref 2.9–4.4)
Alpha-1-Globulin: 0.4 g/dL (ref 0.0–0.4)
Alpha-2-Globulin: 0.7 g/dL (ref 0.4–1.0)
Beta Globulin: 0.7 g/dL (ref 0.7–1.3)
Gamma Globulin: 2.2 g/dL — ABNORMAL HIGH (ref 0.4–1.8)
Globulin, Total: 4 g/dL — ABNORMAL HIGH (ref 2.2–3.9)
M-Spike, %: 0.7 g/dL — ABNORMAL HIGH
SPEP Interpretation: 0
Total Protein ELP: 6.5 g/dL (ref 6.0–8.5)

## 2024-05-19 LAB — IMMUNOFIXATION REFLEX, SERUM
IgA: 198 mg/dL (ref 61–437)
IgG (Immunoglobin G), Serum: 2307 mg/dL — ABNORMAL HIGH (ref 603–1613)
IgM (Immunoglobulin M), Srm: 209 mg/dL — ABNORMAL HIGH (ref 15–143)

## 2024-05-19 MED ORDER — AMIODARONE HCL 200 MG PO TABS: 100.0000 mg | ORAL_TABLET | Freq: Every day | ORAL | Status: DC

## 2024-05-19 MED ORDER — AMIODARONE HCL 200 MG PO TABS: 200.0000 mg | ORAL_TABLET | Freq: Every day | ORAL | Status: DC

## 2024-05-19 NOTE — Addendum Note (Signed)
 Addended by: GRETEL MAEOLA CROME on: 05/19/2024 04:18 PM   Modules accepted: Orders

## 2024-05-19 NOTE — Patient Instructions (Addendum)
 Medication Instructions:  Your physician has recommended you make the following change in your medication:  DECREASE Amiodarone  to 200 mg once daily  *If you need a refill on your cardiac medications before your next appointment, please call your pharmacy*  Lab Work: None ordered   Testing/Procedures: None ordered  Follow-Up: At [facility], you and your health needs are our priority.  As part of our continuing mission to provide you with exceptional heart care, our providers are all part of one team.  This team includes your primary Cardiologist (physician) and Advanced Practice Providers or APPs (Physician Assistants and Nurse Practitioners) who all work together to provide you with the care you need, when you need it.  Your next appointment:   3 month(s)  Provider:   Soyla Norton, MD     Thank you for choosing [facility]!!   Samuel Domino, RN [phone]

## 2024-05-19 NOTE — Addendum Note (Signed)
 Addended by: GRETEL MAEOLA CROME on: 05/19/2024 04:29 PM   Modules accepted: Orders

## 2024-05-19 NOTE — Progress Notes (Signed)
 Electrophysiology Office Note:   Date:  05/19/2024  ID:  Samuel Porter, DOB 1940-12-20, MRN [id number]  Primary Cardiologist: Ozell Fell, MD Primary Heart Failure: None Electrophysiologist: Destane Speas Gladis Norton, MD      History of Present Illness:   Samuel Porter is a 83 y.o. male with h/o hypertension, CHF, melanoma, squamous cell cancer post radical neck dissection, atrial fibrillation seen today for routine electrophysiology followup.   He was seen in atrial fibrillation clinic 04/13/2024 after an extended hospitalization for acute respiratory failure and acute heart failure with recurrent right pleural effusion.  Pulmonary medicine thought he had underlying ILD.  He underwent cardioversion 03/24/2024.  He was back in atrial fibrillation approximately a week after discharge.  His amiodarone  was increased to twice daily dosing.  He is post cardioversion 04/24/2024.  Since last being seen in our clinic the patient reports doing well.  He has noted no further episodes of atrial fibrillation since his most recent cardioversion.  He has been on amiodarone  twice daily since then.  he denies chest pain, palpitations, dyspnea, PND, orthopnea, nausea, vomiting, dizziness, syncope, edema, weight gain, or early satiety.   Review of systems complete and found to be negative unless listed in HPI.   EP Information / Studies Reviewed:    EKG is ordered today. Personal review as below.  EKG Interpretation Date/Time:  Tuesday May 19 2024 15:26:30 EDT Ventricular Rate:  49 PR Interval:  196 QRS Duration:  94 QT Interval:  392 QTC Calculation: 354 R Axis:   4  Text Interpretation: Sinus bradycardia Nonspecific ST and T wave abnormality When compared with ECG of 24-Apr-2024 08:04, Premature atrial complexes are no longer Present ST less depressed in Anterior leads Nonspecific T wave abnormality, improved in Inferior leads T wave inversion no longer evident in Anterolateral leads QT has shortened  Confirmed by Hester Joslin ([id number]) on 05/19/2024 3:42:58 PM     Risk Assessment/Calculations:    CHA2DS2-VASc Score = 4   This indicates a 4.8% annual risk of stroke. The patient's score is based upon: CHF History: 1 HTN History: 1 Diabetes History: 0 Stroke History: 0 Vascular Disease History: 0 Age Score: 2 Gender Score: 0            Physical Exam:   VS:  BP 134/60 (BP Location: Left Arm, Patient Position: Sitting, Cuff Size: Normal)   Pulse (!) 49   Ht 6' 1 (1.854 m)   Wt 155 lb (70.3 kg)   SpO2 94%   BMI 20.45 kg/m    Wt Readings from Last 3 Encounters:  05/19/24 155 lb (70.3 kg)  05/12/24 154 lb 4.8 oz (70 kg)  04/13/24 160 lb 3.2 oz (72.7 kg)     GEN: Well nourished, well developed in no acute distress NECK: No JVD; No carotid bruits CARDIAC: Regular rate and rhythm, no murmurs, rubs, gallops RESPIRATORY:  Clear to auscultation without rales, wheezing or rhonchi  ABDOMEN: Soft, non-tender, non-distended EXTREMITIES:  No edema; No deformity   ASSESSMENT AND PLAN:    1.  Persistent atrial fibrillation: On amiodarone .  Not ideal due to lung disease.  He is a poor ablation candidate.  His amiodarone  is twice daily.  Aarit Kashuba reduce to daily for now.  If he goes back into atrial fibrillation, he may require lifelong twice daily amiodarone .  I do not think that he would be a good dofetilide candidate with history of QT prolongation.  If he goes back into atrial fibrillation and amiodarone  developed side  effects or is not effective, rhythm control may be most beneficial.  2.  Secondary hypercoagulable state: On Eliquis   3.  Chronic systolic heart failure: Ejection fraction has since improved.  Plan per heart failure cardiology  4.  Pulmonary hypertension: Likely group 3.  Plan per primary cardiology.  5.  Interstitial lung disease: Plan per pulmonology  Follow up with Afib Clinic in 3 months  Signed, Jamie Hafford Gladis Norton, MD

## 2024-05-20 ENCOUNTER — Ambulatory Visit: Attending: Hematology and Oncology | Admitting: Hematology and Oncology

## 2024-05-20 DIAGNOSIS — Z6822 Body mass index (BMI) 22.0-22.9, adult: Secondary | ICD-10-CM | POA: Diagnosis not present

## 2024-05-20 DIAGNOSIS — Z7984 Long term (current) use of oral hypoglycemic drugs: Secondary | ICD-10-CM | POA: Diagnosis not present

## 2024-05-20 DIAGNOSIS — I5042 Chronic combined systolic (congestive) and diastolic (congestive) heart failure: Secondary | ICD-10-CM | POA: Diagnosis not present

## 2024-05-20 DIAGNOSIS — J849 Interstitial pulmonary disease, unspecified: Secondary | ICD-10-CM | POA: Diagnosis not present

## 2024-05-20 DIAGNOSIS — E559 Vitamin D deficiency, unspecified: Secondary | ICD-10-CM | POA: Diagnosis not present

## 2024-05-20 DIAGNOSIS — I110 Hypertensive heart disease with heart failure: Secondary | ICD-10-CM | POA: Diagnosis not present

## 2024-05-20 DIAGNOSIS — Z9981 Dependence on supplemental oxygen: Secondary | ICD-10-CM | POA: Diagnosis not present

## 2024-05-20 DIAGNOSIS — Z7901 Long term (current) use of anticoagulants: Secondary | ICD-10-CM | POA: Diagnosis not present

## 2024-05-20 DIAGNOSIS — I4819 Other persistent atrial fibrillation: Secondary | ICD-10-CM | POA: Diagnosis not present

## 2024-05-20 DIAGNOSIS — H409 Unspecified glaucoma: Secondary | ICD-10-CM | POA: Diagnosis not present

## 2024-05-20 DIAGNOSIS — I50811 Acute right heart failure: Secondary | ICD-10-CM | POA: Diagnosis not present

## 2024-05-20 DIAGNOSIS — Z86711 Personal history of pulmonary embolism: Secondary | ICD-10-CM | POA: Diagnosis not present

## 2024-05-20 DIAGNOSIS — J9601 Acute respiratory failure with hypoxia: Secondary | ICD-10-CM | POA: Diagnosis not present

## 2024-05-20 DIAGNOSIS — D6869 Other thrombophilia: Secondary | ICD-10-CM | POA: Diagnosis not present

## 2024-05-20 DIAGNOSIS — E039 Hypothyroidism, unspecified: Secondary | ICD-10-CM | POA: Diagnosis not present

## 2024-05-20 DIAGNOSIS — I272 Pulmonary hypertension, unspecified: Secondary | ICD-10-CM | POA: Diagnosis not present

## 2024-05-20 DIAGNOSIS — D472 Monoclonal gammopathy: Secondary | ICD-10-CM

## 2024-05-20 DIAGNOSIS — E43 Unspecified severe protein-calorie malnutrition: Secondary | ICD-10-CM | POA: Diagnosis not present

## 2024-05-20 DIAGNOSIS — N39498 Other specified urinary incontinence: Secondary | ICD-10-CM | POA: Diagnosis not present

## 2024-05-20 DIAGNOSIS — I2510 Atherosclerotic heart disease of native coronary artery without angina pectoris: Secondary | ICD-10-CM | POA: Diagnosis not present

## 2024-05-20 DIAGNOSIS — N179 Acute kidney failure, unspecified: Secondary | ICD-10-CM | POA: Diagnosis not present

## 2024-05-20 DIAGNOSIS — M1990 Unspecified osteoarthritis, unspecified site: Secondary | ICD-10-CM | POA: Diagnosis not present

## 2024-05-20 DIAGNOSIS — Z556 Problems related to health literacy: Secondary | ICD-10-CM | POA: Diagnosis not present

## 2024-05-20 DIAGNOSIS — N401 Enlarged prostate with lower urinary tract symptoms: Secondary | ICD-10-CM | POA: Diagnosis not present

## 2024-05-20 DIAGNOSIS — Z604 Social exclusion and rejection: Secondary | ICD-10-CM | POA: Diagnosis not present

## 2024-05-20 NOTE — Progress Notes (Signed)
 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  He elected to be treated at [facility].  He was most recently admitted with lateral lower extremity edema, pleural effusion which was thought to be transudative and he is referred back to hematology for evaluation of possible amyloidosis.  Assessment and Plan Assessment & Plan Suspected amyloidosis, less likely Suspected amyloidosis remains a differential diagnosis.  SPEP showed persistent M protein, K/L ratio mildly worse, IG G significantly elevated Recommended CT biopsy, add congo red staining. He is agreeable, this is ordered I also discussed with his daughter, and she is agreeable.    HISTORY OF PRESENTING ILLNESS:   Samuel Porter 83 y.o. 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  He is here today with his daughter and another daughter was on the phone.  Discussed the use of AI scribe software for clinical note  transcription with the patient, who gave verbal consent to proceed.  History of Present Illness  Samuel Porter is an 83 year old male with suspected amyloidosis who presents for follow-up on his condition. He is accompanied by his daughter, Rollo, on the phone  Rest of the pertinent systems were reviewed with the patient and are negative.  MEDICAL HISTORY:  Past Medical History:  Diagnosis Date   Arthritis    oa   BPH (benign prostatic hyperplasia)    Cancer (HCC) 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   Cataracts, bilateral    Glaucoma    Hypertension    Vitamin D  deficiency     SURGICAL HISTORY: Past Surgical History:  Procedure Laterality Date   CARDIOVERSION N/A 03/05/2024   Procedure: CARDIOVERSION;  Surgeon: Mona Vinie BROCKS, MD;  Location: [facility] INVASIVE CV LAB;  Service: Cardiovascular;  Laterality: N/A;   CARDIOVERSION N/A 03/24/2024   Procedure: CARDIOVERSION;  Surgeon: Rolan Ezra RAMAN, MD;  Location: [facility] INVASIVE CV LAB;  Service: Cardiovascular;  Laterality: N/A;   CARDIOVERSION N/A 04/24/2024   Procedure: CARDIOVERSION;  Surgeon: Okey Vina GAILS, MD;  Location: [facility] INVASIVE CV LAB;  Service: Cardiovascular;  Laterality: N/A;   CHEST TUBE INSERTION Right 03/18/2024   Procedure: CHEST TUBE INSERTION;  Surgeon: Annella Donnice SAUNDERS, MD;  Location: [facility] ENDOSCOPY;  Service: Pulmonary;  Laterality: Right;   CHOLECYSTECTOMY     colonscopy  2017   IR THORACENTESIS ASP PLEURAL SPACE W/IMG GUIDE  03/03/2024   JOINT REPLACEMENT Right    hip    RIGHT HEART CATH N/A 03/23/2024   Procedure: RIGHT HEART CATH;  Surgeon: Rolan Ezra RAMAN, MD;  Location: [facility] INVASIVE CV LAB;  Service: Cardiovascular;  Laterality: N/A;   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   Procedure: LEFT TOTAL HIP ARTHROPLASTY ANTERIOR APPROACH;  Surgeon: Dempsey Moan, MD;  Location: [facility] ORS;  Service: Orthopedics;  Laterality: Left;   VENTRAL HERNIA REPAIR N/A 07/12/2023   Procedure: HERNIA REPAIR VENTRAL;  Surgeon:  Stechschulte, Deward PARAS, MD;  Location: [facility] OR;  Service: General;  Laterality: N/A;    SOCIAL HISTORY: Social History   Socioeconomic History   Marital status: Married    Spouse name: Not on file   Number of children: Not on file   Years of education: Not on file   Highest education level: Not on file  Occupational History   Not on file  Tobacco Use   Smoking status: Former    Current packs/day: 0.00    Average packs/day: 1.5 packs/day for 10.0 years (15.0 ttl pk-yrs)    Types: Cigarettes    Start date: 38    Quit date: 98    Years since quitting: 55.6   Smokeless tobacco: Never  Substance and Sexual Activity   Alcohol use: Not Currently    Comment: 1-2 glass wine per day   Drug use: No   Sexual activity: Not on file  Other Topics Concern   Not on file  Social History Narrative   Not on file   Social Drivers of Health   Financial Resource Strain: Not on file  Food Insecurity: Patient Declined (03/17/2024)   Hunger Vital Sign    Worried About Running Out of Food in the Last Year: Patient declined    Ran Out of Food in the Last Year: Patient declined  Transportation Needs: No Transportation Needs (02/04/2024)   Received from [facility]   PRAPARE - Transportation    Lack of Transportation (Medical): No    Lack of Transportation (Non-Medical): No  Physical Activity: Not on file  Stress: Not on file  Social Connections: Not on file  Intimate Partner Violence: Not At Risk (11/30/2022)   Humiliation, Afraid, Rape, and Kick questionnaire    Fear of Current or Ex-Partner: No    Emotionally Abused: No    Physically Abused: No    Sexually Abused: No    FAMILY HISTORY: Family History  Problem Relation Age of Onset   Cancer - Ovarian Sister    Lymphoma Sister     ALLERGIES:  has no known allergies.  MEDICATIONS:  Current Outpatient Medications  Medication Sig Dispense Refill   acetaminophen  (TYLENOL ) 325 MG tablet Take 650 mg by mouth daily as needed for  moderate pain (pain score 4-6) or mild pain (pain score 1-3).     albuterol  (VENTOLIN  HFA) 108 (90 Base) MCG/ACT inhaler Inhale 2 puffs into the lungs every 4 (four) hours as needed for shortness of breath.     amiodarone  (PACERONE ) 200 MG tablet Take 1 tablet (200 mg total) by mouth daily.     cholecalciferol  (VITAMIN D ) 1000 units tablet Take 1,000 Units by mouth daily.     dapagliflozin  propanediol (FARXIGA ) 10 MG TABS tablet Take 1 tablet (10 mg total) by mouth daily before breakfast. 30 tablet 11   docusate sodium  (COLACE) 100 MG capsule Take 1 capsule (100 mg total) by mouth 2 (two) times daily as needed for mild constipation.     dorzolamide -timolol  (COSOPT ) 22.3-6.8 MG/ML ophthalmic solution Place 1 drop into both eyes 2 (two) times daily.     ELIQUIS  5 MG TABS tablet Take 5 mg by mouth 2 (two) times daily.  feeding supplement (BOOST HIGH PROTEIN) LIQD Take 1 Container by mouth daily.     feeding supplement (ENSURE PLUS HIGH PROTEIN) LIQD Take 237 mLs by mouth 3 (three) times daily between meals.     finasteride  (PROSCAR ) 5 MG tablet Take 5 mg by mouth daily.     furosemide  (LASIX ) 20 MG tablet Take 1 tablet (20 mg total) by mouth daily. 90 tablet 3   levothyroxine  (SYNTHROID ) 75 MCG tablet Take 1 tablet (75 mcg total) by mouth daily at 6 (six) AM. 90 tablet 3   mirtazapine  (REMERON ) 7.5 MG tablet Take 1 tablet (7.5 mg total) by mouth at bedtime. 30 tablet 0   Multiple Vitamin (MULTIVITAMIN WITH MINERALS) TABS tablet Take 1 tablet by mouth daily. 30 tablet 0   Nutritional Supplements (,FEEDING SUPPLEMENT, PROSOURCE PLUS) liquid Take 30 mLs by mouth 2 (two) times daily before lunch and supper. 887 mL 0   polyethylene glycol (MIRALAX  / GLYCOLAX ) 17 g packet Take 17 g by mouth 2 (two) times daily as needed.     sildenafil (VIAGRA) 25 MG tablet Take 25 mg by mouth as needed for erectile dysfunction.     No current facility-administered medications for this visit.     PHYSICAL  EXAMINATION: ECOG PERFORMANCE STATUS: 0 - Asymptomatic  PE deferred, telephone visit.  LABORATORY DATA:  I have reviewed the data as listed Lab Results  Component Value Date   WBC 5.5 05/12/2024   HGB 11.8 (L) 05/12/2024   HCT 38.5 (L) 05/12/2024   MCV 93.0 05/12/2024   PLT 165 05/12/2024     Chemistry      Component Value Date/Time   NA 138 05/12/2024 1253   NA 139 04/13/2024 1245   K 4.1 05/12/2024 1253   CL 100 05/12/2024 1253   CO2 36 (H) 05/12/2024 1253   BUN 28 (H) 05/12/2024 1253   BUN 27 04/13/2024 1245   CREATININE 1.02 05/12/2024 1253      Component Value Date/Time   CALCIUM 8.9 05/12/2024 1253   ALKPHOS 146 (H) 05/12/2024 1253   AST 25 05/12/2024 1253   ALT 19 05/12/2024 1253   BILITOT 0.7 05/12/2024 1253       RADIOGRAPHIC STUDIES: I have personally reviewed the radiological images as listed and agreed with the findings in the report. EP STUDY Result Date: 04/24/2024 See surgical note for result.  Time spent: 20 min  I connected with  Samuel Porter on 05/20/24 by a telephone application and verified that I am speaking with the correct person using two identifiers.   I discussed the limitations of evaluation and management by telemedicine. The patient expressed understanding and agreed to proceed.  Location of pt: Home Location of provider: office.   Amber Stalls, MD 05/20/2024 12:50 PM

## 2024-05-25 DIAGNOSIS — Z556 Problems related to health literacy: Secondary | ICD-10-CM | POA: Diagnosis not present

## 2024-05-25 DIAGNOSIS — I50811 Acute right heart failure: Secondary | ICD-10-CM | POA: Diagnosis not present

## 2024-05-25 DIAGNOSIS — D6869 Other thrombophilia: Secondary | ICD-10-CM | POA: Diagnosis not present

## 2024-05-25 DIAGNOSIS — E559 Vitamin D deficiency, unspecified: Secondary | ICD-10-CM | POA: Diagnosis not present

## 2024-05-25 DIAGNOSIS — M1990 Unspecified osteoarthritis, unspecified site: Secondary | ICD-10-CM | POA: Diagnosis not present

## 2024-05-25 DIAGNOSIS — J9601 Acute respiratory failure with hypoxia: Secondary | ICD-10-CM | POA: Diagnosis not present

## 2024-05-25 DIAGNOSIS — J849 Interstitial pulmonary disease, unspecified: Secondary | ICD-10-CM | POA: Diagnosis not present

## 2024-05-25 DIAGNOSIS — N179 Acute kidney failure, unspecified: Secondary | ICD-10-CM | POA: Diagnosis not present

## 2024-05-25 DIAGNOSIS — I4819 Other persistent atrial fibrillation: Secondary | ICD-10-CM | POA: Diagnosis not present

## 2024-05-25 DIAGNOSIS — H409 Unspecified glaucoma: Secondary | ICD-10-CM | POA: Diagnosis not present

## 2024-05-25 DIAGNOSIS — Z9981 Dependence on supplemental oxygen: Secondary | ICD-10-CM | POA: Diagnosis not present

## 2024-05-25 DIAGNOSIS — I110 Hypertensive heart disease with heart failure: Secondary | ICD-10-CM | POA: Diagnosis not present

## 2024-05-25 DIAGNOSIS — Z86711 Personal history of pulmonary embolism: Secondary | ICD-10-CM | POA: Diagnosis not present

## 2024-05-25 DIAGNOSIS — E039 Hypothyroidism, unspecified: Secondary | ICD-10-CM | POA: Diagnosis not present

## 2024-05-25 DIAGNOSIS — N401 Enlarged prostate with lower urinary tract symptoms: Secondary | ICD-10-CM | POA: Diagnosis not present

## 2024-05-25 DIAGNOSIS — Z6822 Body mass index (BMI) 22.0-22.9, adult: Secondary | ICD-10-CM | POA: Diagnosis not present

## 2024-05-25 DIAGNOSIS — I272 Pulmonary hypertension, unspecified: Secondary | ICD-10-CM | POA: Diagnosis not present

## 2024-05-25 DIAGNOSIS — I5042 Chronic combined systolic (congestive) and diastolic (congestive) heart failure: Secondary | ICD-10-CM | POA: Diagnosis not present

## 2024-05-25 DIAGNOSIS — Z604 Social exclusion and rejection: Secondary | ICD-10-CM | POA: Diagnosis not present

## 2024-05-25 DIAGNOSIS — Z7901 Long term (current) use of anticoagulants: Secondary | ICD-10-CM | POA: Diagnosis not present

## 2024-05-25 DIAGNOSIS — N39498 Other specified urinary incontinence: Secondary | ICD-10-CM | POA: Diagnosis not present

## 2024-05-25 DIAGNOSIS — I2510 Atherosclerotic heart disease of native coronary artery without angina pectoris: Secondary | ICD-10-CM | POA: Diagnosis not present

## 2024-05-25 DIAGNOSIS — Z7984 Long term (current) use of oral hypoglycemic drugs: Secondary | ICD-10-CM | POA: Diagnosis not present

## 2024-05-25 DIAGNOSIS — E43 Unspecified severe protein-calorie malnutrition: Secondary | ICD-10-CM | POA: Diagnosis not present

## 2024-05-27 DIAGNOSIS — I5020 Unspecified systolic (congestive) heart failure: Secondary | ICD-10-CM | POA: Diagnosis not present

## 2024-05-28 ENCOUNTER — Other Ambulatory Visit: Payer: Self-pay | Admitting: Student

## 2024-05-28 ENCOUNTER — Ambulatory Visit (HOSPITAL_COMMUNITY)
Admission: RE | Admit: 2024-05-28 | Discharge: 2024-05-28 | Disposition: A | Discharge status: Home / Self Care | Source: Ambulatory Visit | Attending: Cardiology | Admitting: Cardiology

## 2024-05-28 DIAGNOSIS — I5020 Unspecified systolic (congestive) heart failure: Secondary | ICD-10-CM | POA: Insufficient documentation

## 2024-05-28 DIAGNOSIS — I4819 Other persistent atrial fibrillation: Secondary | ICD-10-CM | POA: Insufficient documentation

## 2024-05-28 DIAGNOSIS — D472 Monoclonal gammopathy: Secondary | ICD-10-CM

## 2024-05-28 DIAGNOSIS — I4891 Unspecified atrial fibrillation: Secondary | ICD-10-CM | POA: Diagnosis not present

## 2024-05-28 LAB — ECHOCARDIOGRAM COMPLETE
Area-P 1/2: 3.08 cm2
S' Lateral: 3.5 cm

## 2024-05-29 ENCOUNTER — Other Ambulatory Visit: Payer: Self-pay

## 2024-05-29 ENCOUNTER — Ambulatory Visit (HOSPITAL_COMMUNITY)
Admission: RE | Admit: 2024-05-29 | Discharge: 2024-05-29 | Disposition: A | Discharge status: Home / Self Care | Source: Ambulatory Visit | Attending: Hematology and Oncology | Admitting: Hematology and Oncology

## 2024-05-29 ENCOUNTER — Encounter (HOSPITAL_COMMUNITY): Payer: Self-pay

## 2024-05-29 DIAGNOSIS — C9000 Multiple myeloma not having achieved remission: Secondary | ICD-10-CM | POA: Diagnosis not present

## 2024-05-29 DIAGNOSIS — R896 Abnormal cytological findings in specimens from other organs, systems and tissues: Secondary | ICD-10-CM | POA: Insufficient documentation

## 2024-05-29 DIAGNOSIS — D6489 Other specified anemias: Secondary | ICD-10-CM | POA: Diagnosis not present

## 2024-05-29 DIAGNOSIS — D696 Thrombocytopenia, unspecified: Secondary | ICD-10-CM | POA: Diagnosis not present

## 2024-05-29 DIAGNOSIS — Z8589 Personal history of malignant neoplasm of other organs and systems: Secondary | ICD-10-CM | POA: Diagnosis not present

## 2024-05-29 DIAGNOSIS — D649 Anemia, unspecified: Secondary | ICD-10-CM | POA: Insufficient documentation

## 2024-05-29 DIAGNOSIS — Z9221 Personal history of antineoplastic chemotherapy: Secondary | ICD-10-CM | POA: Diagnosis not present

## 2024-05-29 DIAGNOSIS — R600 Localized edema: Secondary | ICD-10-CM | POA: Insufficient documentation

## 2024-05-29 DIAGNOSIS — J90 Pleural effusion, not elsewhere classified: Secondary | ICD-10-CM | POA: Insufficient documentation

## 2024-05-29 DIAGNOSIS — D472 Monoclonal gammopathy: Secondary | ICD-10-CM | POA: Insufficient documentation

## 2024-05-29 LAB — CBC WITH DIFFERENTIAL/PLATELET
Abs Granulocyte: 3 K/uL (ref 1.5–6.5)
Abs Immature Granulocytes: 0.02 K/uL (ref 0.00–0.07)
Basophils Absolute: 0 K/uL (ref 0.0–0.1)
Basophils Relative: 0 %
Eosinophils Absolute: 0.1 K/uL (ref 0.0–0.5)
Eosinophils Relative: 2 %
HCT: 42.2 % (ref 39.0–52.0)
Hemoglobin: 12.4 g/dL — ABNORMAL LOW (ref 13.0–17.0)
Immature Granulocytes: 0 %
Lymphocytes Relative: 13 %
Lymphs Abs: 0.6 K/uL — ABNORMAL LOW (ref 0.7–4.0)
MCH: 28.1 pg (ref 26.0–34.0)
MCHC: 29.4 g/dL — ABNORMAL LOW (ref 30.0–36.0)
MCV: 95.5 fL (ref 80.0–100.0)
Monocytes Absolute: 0.9 K/uL (ref 0.1–1.0)
Monocytes Relative: 19 %
Neutro Abs: 3 K/uL (ref 1.7–7.7)
Neutrophils Relative %: 66 %
Platelets: 138 K/uL — ABNORMAL LOW (ref 150–400)
RBC: 4.42 MIL/uL (ref 4.22–5.81)
RDW: 15.7 % — ABNORMAL HIGH (ref 11.5–15.5)
WBC: 4.5 K/uL (ref 4.0–10.5)
nRBC: 0 % (ref 0.0–0.2)

## 2024-05-29 MED ORDER — FENTANYL CITRATE (PF) 100 MCG/2ML IJ SOLN
INTRAMUSCULAR | Status: AC | PRN
  Administered 2024-05-29: 50 ug via INTRAVENOUS

## 2024-05-29 MED ORDER — MIDAZOLAM HCL 2 MG/2ML IJ SOLN
INTRAMUSCULAR | Status: AC | PRN
  Administered 2024-05-29: 1 mg via INTRAVENOUS

## 2024-05-29 MED ORDER — MIDAZOLAM HCL 2 MG/2ML IJ SOLN
INTRAMUSCULAR | Status: AC | PRN
Start: 2024-05-29 — End: 2024-05-29
  Administered 2024-05-29: 1 mg via INTRAVENOUS

## 2024-05-29 MED ORDER — SODIUM CHLORIDE 0.9 % IV SOLN: INTRAVENOUS | Status: DC

## 2024-05-29 MED ORDER — MIDAZOLAM HCL 2 MG/2ML IJ SOLN
INTRAMUSCULAR | Status: AC
  Filled 2024-05-29: qty 2

## 2024-05-29 MED ORDER — FENTANYL CITRATE (PF) 100 MCG/2ML IJ SOLN
INTRAMUSCULAR | Status: AC
  Filled 2024-05-29: qty 2

## 2024-05-29 NOTE — Progress Notes (Signed)
 0855 Ice bag given to use at home as needed for comfort to biopsy site as instructed.

## 2024-05-29 NOTE — Telephone Encounter (Signed)
 Last chest x-ray we got a few months ago shows a small fluid collection on the right side we can follow-up with a repeat chest x-ray Please order repeat chest x-ray and follow-up in clinic.

## 2024-05-29 NOTE — Sedation Documentation (Signed)
 RN Elara Cocke pulled 2 mg Versed  and 100 mcg Fentanyl  in IR med room. Pt. Received 2 mg Versed  and 50 mcg Fentanyl  throughout the procedure. RN Kiya Eno wasted 50 mcg Fentanyl  with Rockey PEAK.

## 2024-05-29 NOTE — Addendum Note (Signed)
 Addended by: CLAUDENE NEVINS A on: 05/29/2024 09:31 AM   Modules accepted: Orders

## 2024-05-29 NOTE — H&P (Signed)
 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 Recent SPEP on 7/29 showed persistent M-spike, a mildly worse K/L ratio and IgG that was significantly elevated. He was subsequently referred to IR for bone marrow biopsy.   He presents today with his daughter at the bedside. Patient admits to some increased fatigue and weight loss over the past year that he attributes to his immunotherapy treatments, but is otherwise without complaints. Denies any fevers/chills, chest pain, shortness of breath, nausea/vomiting. NPO since midnight. All questions and concerns answered at the bedside.    Code Status: Full Code  Past Medical History:  Diagnosis Date   Arthritis    oa   BPH (benign prostatic hyperplasia)    Cancer (HCC) 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   Cataracts, bilateral    Glaucoma    Hypertension    Vitamin D  deficiency     Past Surgical History:  Procedure Laterality Date   CARDIOVERSION N/A 03/05/2024   Procedure: CARDIOVERSION;  Surgeon: Mona Vinie BROCKS, MD;  Location: [facility] INVASIVE CV LAB;  Service: Cardiovascular;  Laterality: N/A;   CARDIOVERSION N/A 03/24/2024   Procedure: CARDIOVERSION;  Surgeon: Rolan Ezra RAMAN, MD;  Location: [facility] INVASIVE CV LAB;  Service: Cardiovascular;  Laterality: N/A;   CARDIOVERSION N/A 04/24/2024   Procedure: CARDIOVERSION;  Surgeon: Okey Vina GAILS, MD;  Location: [facility] INVASIVE CV LAB;  Service: Cardiovascular;   Laterality: N/A;   CHEST TUBE INSERTION Right 03/18/2024   Procedure: CHEST TUBE INSERTION;  Surgeon: Annella Donnice SAUNDERS, MD;  Location: [facility] ENDOSCOPY;  Service: Pulmonary;  Laterality: Right;   CHOLECYSTECTOMY     colonscopy  2017   IR THORACENTESIS ASP PLEURAL SPACE W/IMG GUIDE  03/03/2024   JOINT REPLACEMENT Right    hip    RIGHT HEART CATH N/A 03/23/2024   Procedure: RIGHT HEART CATH;  Surgeon: Rolan Ezra RAMAN, MD;  Location: [facility] INVASIVE CV LAB;  Service: Cardiovascular;  Laterality: N/A;   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   Procedure: LEFT TOTAL HIP ARTHROPLASTY ANTERIOR APPROACH;  Surgeon: Dempsey Moan, MD;  Location: [facility] ORS;  Service: Orthopedics;  Laterality: Left;   VENTRAL HERNIA REPAIR N/A 07/12/2023   Procedure: HERNIA REPAIR VENTRAL;  Surgeon: Lyndel Deward JINNY, MD;  Location: [facility] OR;  Service: General;  Laterality: N/A;    Allergies: Patient has no known allergies.  Medications: Prior to Admission medications   Medication Sig Start Date End Date Taking? Authorizing Provider  amiodarone  (PACERONE ) 200 MG tablet Take 1 tablet (200 mg total) by mouth daily. 05/19/24  Yes Camnitz, Soyla Lunger, MD  cholecalciferol  (VITAMIN D ) 1000 units tablet Take 1,000 Units by mouth daily.   Yes [provider]  dapagliflozin  propanediol (FARXIGA ) 10 MG TABS tablet Take 1 tablet (10 mg total) by mouth daily before breakfast. 02/26/24  Yes Wonda Sharper, MD  dorzolamide -timolol  (COSOPT ) 22.3-6.8 MG/ML ophthalmic solution Place 1 drop into both eyes 2 (two) times daily. 07/09/16  Yes [provider]  ELIQUIS  5 MG TABS tablet Take 5 mg by mouth 2 (  two) times daily.   Yes [provider]  feeding supplement (BOOST HIGH PROTEIN) LIQD Take 1 Container by mouth daily.   Yes [provider]  feeding supplement (ENSURE PLUS HIGH PROTEIN) LIQD Take 237 mLs by mouth 3 (three) times daily between meals. 03/27/24  Yes Elgergawy, Brayton RAMAN, MD  finasteride  (PROSCAR ) 5 MG  tablet Take 5 mg by mouth daily.   Yes [provider]  levothyroxine  (SYNTHROID ) 75 MCG tablet Take 1 tablet (75 mcg total) by mouth daily at 6 (six) AM. 05/14/24  Yes Zenaida Morene PARAS, MD  mirtazapine  (REMERON ) 7.5 MG tablet Take 1 tablet (7.5 mg total) by mouth at bedtime. 03/27/24  Yes Elgergawy, Brayton RAMAN, MD  Multiple Vitamin (MULTIVITAMIN WITH MINERALS) TABS tablet Take 1 tablet by mouth daily. 03/27/24  Yes Elgergawy, Brayton RAMAN, MD  Nutritional Supplements (,FEEDING SUPPLEMENT, PROSOURCE PLUS) liquid Take 30 mLs by mouth 2 (two) times daily before lunch and supper. 03/27/24  Yes Elgergawy, Brayton RAMAN, MD  polyethylene glycol (MIRALAX  / GLYCOLAX ) 17 g packet Take 17 g by mouth 2 (two) times daily as needed. 07/16/23  Yes Maczis, Michael M, PA-C  acetaminophen  (TYLENOL ) 325 MG tablet Take 650 mg by mouth daily as needed for moderate pain (pain score 4-6) or mild pain (pain score 1-3).    [provider]  albuterol  (VENTOLIN  HFA) 108 (90 Base) MCG/ACT inhaler Inhale 2 puffs into the lungs every 4 (four) hours as needed for shortness of breath. 11/22/23   [provider]  docusate sodium  (COLACE) 100 MG capsule Take 1 capsule (100 mg total) by mouth 2 (two) times daily as needed for mild constipation. 03/27/24   Elgergawy, Brayton RAMAN, MD  furosemide  (LASIX ) 20 MG tablet Take 1 tablet (20 mg total) by mouth daily. 02/07/24 05/12/24  Cooper, Michael, MD  sildenafil (VIAGRA) 25 MG tablet Take 25 mg by mouth as needed for erectile dysfunction.    [provider]     Family History  Problem Relation Age of Onset   Cancer - Ovarian Sister    Lymphoma Sister     Social History   Socioeconomic History   Marital status: Married    Spouse name: Not on file   Number of children: Not on file   Years of education: Not on file   Highest education level: Not on file  Occupational History   Not on file  Tobacco Use   Smoking status: Former    Current packs/day: 0.00     Average packs/day: 1.5 packs/day for 10.0 years (15.0 ttl pk-yrs)    Types: Cigarettes    Start date: 60    Quit date: 32    Years since quitting: 55.6   Smokeless tobacco: Never  Substance and Sexual Activity   Alcohol use: Not Currently    Comment: 1-2 glass wine per day   Drug use: No   Sexual activity: Not on file  Other Topics Concern   Not on file  Social History Narrative   Not on file   Social Drivers of Health   Financial Resource Strain: Not on file  Food Insecurity: Patient Declined (03/17/2024)   Hunger Vital Sign    Worried About Running Out of Food in the Last Year: Patient declined    Ran Out of Food in the Last Year: Patient declined  Transportation Needs: No Transportation Needs (02/04/2024)   Received [facility]   PRAPARE - Transportation    Lack of Transportation (Medical): No  Lack of Transportation (Non-Medical): No  Physical Activity: Not on file  Stress: Not on file  Social Connections: Not on file    Review of Systems  Constitutional:  Positive for fatigue and unexpected weight change.  Denies any N/V, chest pain, shortness of breath, fevers/chills. All other ROS negative.  Vital Signs: BP (!) 144/66 (BP Location: Right Arm)   Pulse (!) 53   Temp 98.2 F (36.8 C) (Oral)   Resp 16   Ht 6' 1 (1.854 m)   Wt 154 lb 15.7 oz (70.3 kg)   SpO2 94%   BMI 20.45 kg/m    Physical Exam Constitutional:      Appearance: Normal appearance.  HENT:     Mouth/Throat:     Mouth: Mucous membranes are moist.     Pharynx: Oropharynx is clear.  Cardiovascular:     Rate and Rhythm: Normal rate and regular rhythm.  Pulmonary:     Effort: Pulmonary effort is normal.     Breath sounds: Normal breath sounds.  Abdominal:     General: Abdomen is flat.     Palpations: Abdomen is soft.     Tenderness: There is no abdominal tenderness.  Skin:    General: Skin is warm and dry.  Neurological:     General: No focal deficit present.     Mental  Status: He is alert and oriented to person, place, and time.  Psychiatric:        Behavior: Behavior normal.     Imaging: ECHOCARDIOGRAM COMPLETE Result Date: 05/28/2024    ECHOCARDIOGRAM REPORT   Patient Name:   MERLON ALCORTA Date of Exam: 05/28/2024 Medical Rec #:  [id number]       Height:       73.0 in Accession #:    [id number]      Weight:       155.0 lb Date of Birth:  05-09-1941        BSA:          1.931 m Patient Age:    83 years        BP:           134/60 mmHg Patient Gender: M               HR:           47 bpm. Exam Location:  [address] Procedure: 2D Echo, Cardiac Doppler and Color Doppler (Both Spectral and Color            Flow Doppler were utilized during procedure). Indications:    I48.91 Atrial Fibrillation  History:        Patient has prior history of Echocardiogram examinations, most                 recent 03/20/2024. CHF, Arrythmias:Atrial Fibrillation,                 Signs/Symptoms:Fatigue; Risk Factors:Hypertension and Former                 Smoker. A-Fib (new onset- Feb 2025) with Cardioversion, Pleural                 Effusion with Acute Respiratory Failure s/p Thoracentesis, Heart                 Failure with Reduced EF.  Sonographer:    Heather Hawks RDCS Referring Phys: [employer] IMPRESSIONS  1. Left ventricular ejection fraction, by estimation, is 60 to 65%. The left  ventricle has normal function. The left ventricle has no regional wall motion abnormalities. Left ventricular diastolic parameters are consistent with Grade II diastolic dysfunction (pseudonormalization).  2. Right ventricular systolic function is moderately reduced. The right ventricular size is moderately enlarged. There is moderately elevated pulmonary artery systolic pressure. The estimated right ventricular systolic pressure is 59.1 mmHg.  3. Left atrial size was severely dilated.  4. Right atrial size was severely dilated.  5. The mitral valve is normal in structure. No evidence of mitral valve  regurgitation. No evidence of mitral stenosis.  6. Tricuspid valve regurgitation is mild to moderate. Severe tricuspid stenosis.  7. The aortic valve is tricuspid. There is mild calcification of the aortic valve. Aortic valve regurgitation is not visualized. Aortic valve sclerosis/calcification is present, without any evidence of aortic stenosis.  8. Aortic dilatation noted. There is borderline dilatation of the ascending aorta, measuring 38 mm.  9. The inferior vena cava is dilated in size with <50% respiratory variability, suggesting right atrial pressure of 15 mmHg. FINDINGS  Left Ventricle: Left ventricular ejection fraction, by estimation, is 60 to 65%. The left ventricle has normal function. The left ventricle has no regional wall motion abnormalities. The left ventricular internal cavity size was normal in size. There is  no left ventricular hypertrophy. Left ventricular diastolic parameters are consistent with Grade II diastolic dysfunction (pseudonormalization). Right Ventricle: The right ventricular size is moderately enlarged. No increase in right ventricular wall thickness. Right ventricular systolic function is moderately reduced. There is moderately elevated pulmonary artery systolic pressure. The tricuspid  regurgitant velocity is 3.32 m/s, and with an assumed right atrial pressure of 15 mmHg, the estimated right ventricular systolic pressure is 59.1 mmHg. Left Atrium: Left atrial size was severely dilated. Right Atrium: Right atrial size was severely dilated. Pericardium: There is no evidence of pericardial effusion. Mitral Valve: The mitral valve is normal in structure. Mild mitral annular calcification. No evidence of mitral valve regurgitation. No evidence of mitral valve stenosis. Tricuspid Valve: The tricuspid valve is normal in structure. Tricuspid valve regurgitation is mild to moderate. Severe tricuspid stenosis. Aortic Valve: The aortic valve is tricuspid. There is mild calcification of the  aortic valve. Aortic valve regurgitation is not visualized. Aortic valve sclerosis/calcification is present, without any evidence of aortic stenosis. Pulmonic Valve: The pulmonic valve was normal in structure. Pulmonic valve regurgitation is trivial. No evidence of pulmonic stenosis. Aorta: Aortic dilatation noted. There is borderline dilatation of the ascending aorta, measuring 38 mm. Venous: The inferior vena cava is dilated in size with less than 50% respiratory variability, suggesting right atrial pressure of 15 mmHg. IAS/Shunts: No atrial level shunt detected by color flow Doppler.  LEFT VENTRICLE PLAX 2D LVIDd:         5.10 cm   Diastology LVIDs:         3.50 cm   LV e' medial:    5.44 cm/s LV PW:         1.00 cm   LV E/e' medial:  15.1 LV IVS:        0.90 cm   LV e' lateral:   7.02 cm/s LVOT diam:     2.15 cm   LV E/e' lateral: 11.7 LV SV:         77 LV SV Index:   40 LVOT Area:     3.63 cm  RIGHT VENTRICLE RV Basal diam:  5.10 cm RV Mid diam:    4.30 cm RV S prime:  13.20 cm/s TAPSE (M-mode): 1.8 cm RVSP:           59.1 mmHg LEFT ATRIUM              Index        RIGHT ATRIUM            Index LA diam:        4.60 cm  2.38 cm/m   RA Pressure: 15.00 mmHg LA Vol (A2C):   103.0 ml 53.35 ml/m  RA Area:     33.40 cm LA Vol (A4C):   86.2 ml  44.65 ml/m  RA Volume:   136.00 ml  70.44 ml/m LA Biplane Vol: 95.0 ml  49.20 ml/m  AORTIC VALVE LVOT Vmax:   105.00 cm/s LVOT Vmean:  63.400 cm/s LVOT VTI:    0.213 m  AORTA Ao Root diam: 3.70 cm Ao Asc diam:  3.80 cm MITRAL VALVE               TRICUSPID VALVE MV Area (PHT): cm         TR Peak grad:   44.1 mmHg MV Decel Time: 247 msec    TR Vmax:        332.00 cm/s MV E velocity: 82.30 cm/s  Estimated RAP:  15.00 mmHg MV A velocity: 34.35 cm/s  RVSP:           59.1 mmHg MV E/A ratio:  2.40                            SHUNTS                            Systemic VTI:  0.21 m                            Systemic Diam: 2.15 cm Toribio Fuel MD Electronically signed by  Toribio Fuel MD Signature Date/Time: 05/28/2024/9:23:17 PM    Final     Labs:  CBC: Recent Labs    03/27/24 0651 04/03/24 0946 04/13/24 1245 05/12/24 1253  WBC 6.4 7.0 6.0 5.5  HGB 12.0* 12.0* 12.0* 11.8*  HCT 39.9 38.2* 37.8 38.5*  PLT 126* 182 191 165    COAGS: Recent Labs    07/11/23 1653 03/17/24 0338 03/18/24 0332 03/19/24 0427 03/23/24 0626  INR 1.2  --   --   --   --   APTT  --  63* 106* 72* 92*    BMP: Recent Labs    03/25/24 0552 03/26/24 0750 03/27/24 0651 04/13/24 1245 05/12/24 1253  NA 137 137 138 139 138  K 4.1 4.4 4.7 5.1 4.1  CL 95* 95* 98 100 100  CO2 37* 33* 28 32* 36*  GLUCOSE 138* 125* 86 98 104*  BUN 40* 36* 35* 27 28*  CALCIUM 8.9 9.0 9.0 9.0 8.9  CREATININE 1.17 1.16 1.12 1.02 1.02  GFRNONAA >60 >60 >60  --  >60    LIVER FUNCTION TESTS: Recent Labs    07/11/23 1653 03/16/24 1923 03/17/24 0338 05/12/24 1253  BILITOT 1.1 1.1 0.8 0.7  AST 17 21 16 25   ALT 15 19 17 19   ALKPHOS 73 100 83 146*  PROT 7.8 6.6 5.9* 7.2  ALBUMIN  3.9 2.6* 2.3* 2.9*    TUMOR MARKERS: No results for input(s): AFPTM, CEA, CA199, CHROMGRNA in the last 8760 hours.  Assessment and Plan:  Monoclonal Gammopathy: Samuel Porter is a 83 y.o. male with a history of SCC of the submandibular gland and recently with concerns for amyloidosis vs myeloma who presents to [facility] for an image-guided bone marrow biopsy with Dr. KANDICE Moan. Procedure to be performed under moderate sedation.  Risks and benefits of bone marrow biopsy was discussed with the patient and/or patient's family including, but not limited to bleeding, infection, damage to adjacent structures or low yield requiring additional tests.  All of the questions were answered and there is agreement to proceed.  Consent signed and in chart.   Thank you for this interesting consult. I greatly enjoyed meeting Samuel Porter and look forward to participating in their care. A copy of this  report was sent to the requesting provider on this date.  Electronically Signed: Halayna Blane M Diva Lemberger, PA-C 05/29/2024, 8:04 AM   I spent a total of  30 Minutes in face to face clinical consultation, greater than 50% of which was counseling/coordinating care for bone marrow biopsy.

## 2024-05-29 NOTE — Procedures (Addendum)
 Interventional Radiology Procedure Note  Procedure: CT guided bone marrow aspiration and biopsy  Complications: None  EBL: < 10 mL  Findings: Aspirate and core biopsy performed of bone marrow in right iliac bone.  Plan: Bedrest supine x 1 hrs  Ericha Whittingham T. Fredia Sorrow, M.D Pager:  [phone]

## 2024-05-29 NOTE — Discharge Instructions (Addendum)
 Discharge Instructions:   Please call Interventional Radiology clinic [phone] with any questions or concerns.  You may remove your dressing and shower tomorrow.  Apply triple antibiotic ointment daily until healed, apply dry dressing, monitor for signs of infections, continuous bleeding,bruising,swelling, redness, warm to touch, odor, drainage,pus or fever 100 or greater to skin tears both arms.         Bone Marrow Aspiration and Bone Marrow Biopsy, Adult, Care After  This sheet gives you information about how to care for yourself after your procedure. Your health care provider may also give you more specific instructions. If you have problems or questions, contact your health care provider. What can I expect after the procedure? After the procedure, it is common to have: Mild pain and tenderness. Swelling. Bruising. Follow these instructions at home: Puncture site care  Follow instructions from your health care provider about how to take care of the puncture site. Make sure you: Wash your hands with soap and water  before and after you change your bandage (dressing). If soap and water  are not available, use hand sanitizer. Change your dressing as told by your health care provider. Check your puncture site every day for signs of infection. Check for: More redness, swelling, or pain. Fluid or blood. Warmth. Pus or a bad smell. Activity Return to your normal activities as told by your health care provider. Ask your health care provider what activities are safe for you. Do not lift anything that is heavier than 10 lb (4.5 kg), or the limit that you are told, until your health care provider says that it is safe. Do not drive for 24 hours if you were given a sedative during your procedure. General instructions  Take over-the-counter and prescription medicines only as told by your health care provider. Do not take baths, swim, or use a hot tub until your health care provider  approves. Ask your health care provider if you may take showers. You may only be allowed to take sponge baths. If directed, put ice on the affected area. To do this: Put ice in a plastic bag. Place a towel between your skin and the bag. Leave the ice on for 20 minutes, 2-3 times a day. Keep all follow-up visits as told by your health care provider. This is important. Contact a health care provider if: Your pain is not controlled with medicine. You have a fever. You have more redness, swelling, or pain around the puncture site. You have fluid or blood coming from the puncture site. Your puncture site feels warm to the touch. You have pus or a bad smell coming from the puncture site. Summary After the procedure, it is common to have mild pain, tenderness, swelling, and bruising. Follow instructions from your health care provider about how to take care of the puncture site and what activities are safe for you. Take over-the-counter and prescription medicines only as told by your health care provider. Contact a health care provider if you have any signs of infection, such as fluid or blood coming from the puncture site. This information is not intended to replace advice given to you by your health care provider. Make sure you discuss any questions you have with your health care provider. Document Revised: 02/17/2019 Document Reviewed: 02/17/2019 Elsevier Patient Education  2023 Elsevier Inc.    Moderate Conscious Sedation, Adult, Care After  This sheet gives you information about how to care for yourself after your procedure. Your health care provider may also give you more  specific instructions. If you have problems or questions, contact your health care provider. What can I expect after the procedure? After the procedure, it is common to have: Sleepiness for several hours. Impaired judgment for several hours. Difficulty with balance. Vomiting if you eat too soon. Follow these  instructions at home: For the time period you were told by your health care provider: Rest. Do not participate in activities where you could fall or become injured. Do not drive or use machinery. Do not drink alcohol. Do not take sleeping pills or medicines that cause drowsiness. Do not make important decisions or sign legal documents. Do not take care of children on your own. Eating and drinking  Follow the diet recommended by your health care provider. Drink enough fluid to keep your urine pale yellow. If you vomit: Drink water , juice, or soup when you can drink without vomiting. Make sure you have little or no nausea before eating solid foods. General instructions Take over-the-counter and prescription medicines only as told by your health care provider. Have a responsible adult stay with you for the time you are told. It is important to have someone help care for you until you are awake and alert. Do not smoke. Keep all follow-up visits as told by your health care provider. This is important. Contact a health care provider if: You are still sleepy or having trouble with balance after 24 hours. You feel light-headed. You keep feeling nauseous or you keep vomiting. You develop a rash. You have a fever. You have redness or swelling around the IV site. Get help right away if: You have trouble breathing. You have new-onset confusion at home. Summary After the procedure, it is common to feel sleepy, have impaired judgment, or feel nauseous if you eat too soon. Rest after you get home. Know the things you should not do after the procedure. Follow the diet recommended by your health care provider and drink enough fluid to keep your urine pale yellow. Get help right away if you have trouble breathing or new-onset confusion at home. This information is not intended to replace advice given to you by your health care provider. Make sure you discuss any questions you have with your health  care provider. Document Revised: 01/29/2020 Document Reviewed: 08/27/2019 Elsevier Patient Education  2023 [employer].

## 2024-05-29 NOTE — Telephone Encounter (Signed)
 Mr. Comp,  Below I have copied Dr. Jiles response. I have ordered the x ray.  You can come into our office to have this performed. Our office does close at 12:00 today. Please contact our office at [phone] to schedule a follow up with Dr. Theophilus.   Last chest x-ray we got a few months ago shows a small fluid collection on the right side we can follow-up with a repeat chest x-ray Please order repeat chest x-ray and follow-up in clinic.

## 2024-06-01 ENCOUNTER — Ambulatory Visit: Attending: Cardiovascular Disease | Admitting: Cardiovascular Disease

## 2024-06-01 ENCOUNTER — Encounter: Payer: Self-pay | Admitting: Cardiovascular Disease

## 2024-06-01 VITALS — BP 158/70 | HR 52 | Ht 73.0 in | Wt 155.0 lb

## 2024-06-01 DIAGNOSIS — I4819 Other persistent atrial fibrillation: Secondary | ICD-10-CM

## 2024-06-01 DIAGNOSIS — I5020 Unspecified systolic (congestive) heart failure: Secondary | ICD-10-CM | POA: Diagnosis not present

## 2024-06-01 DIAGNOSIS — J90 Pleural effusion, not elsewhere classified: Secondary | ICD-10-CM

## 2024-06-01 DIAGNOSIS — Z79899 Other long term (current) drug therapy: Secondary | ICD-10-CM | POA: Diagnosis not present

## 2024-06-01 NOTE — Assessment & Plan Note (Signed)
 Followed by pulmonary.  Discussed issues they have had around initiating pulmonary rehab.  Advised that our office would reach out to them to make sure that he is on the list to get started.  He continues on oxygen.  Follow-up with Dr. Theophilus as scheduled.

## 2024-06-01 NOTE — Patient Instructions (Addendum)
 Medication Instructions:  No medication changes were made at this visit. Continue current regimen.   *If you need a refill on your cardiac medications before your next appointment, please call your pharmacy*  Lab Work: To be completed in 6 months (prior to 6 month follow-up appt): liver function test, TSH, free T4  If you have labs (blood work) drawn today and your tests are completely normal, you will receive your results only by: MyChart Message (if you have MyChart) OR A paper copy in the mail If you have any lab test that is abnormal or we need to change your treatment, we will call you to review the results.  Testing/Procedures: None ordered today.  Follow-Up: At [facility], you and your health needs are our priority.  As part of our continuing mission to provide you with exceptional heart care, our providers are all part of one team.  This team includes your primary Cardiologist (physician) and Advanced Practice Providers or APPs (Physician Assistants and Nurse Practitioners) who all work together to provide you with the care you need, when you need it.  Your next appointment:   6 month(s)  Provider:   Ozell Fell, MD or APP

## 2024-06-01 NOTE — Assessment & Plan Note (Signed)
 Maintaining sinus rhythm on amiodarone .  Continue anticoagulation with apixaban .  Appreciate care of Dr. Inocencio in the atrial fibrillation clinic.  Continue current management.  Reviewed amiodarone  monitoring labs with normal LFTs but thyroid  function testing indicative of hypothyroidism.  The patient's TSH is significantly elevated, but his free T4 was normal.  Levothyroxine  was increased with plan for follow-up lab studies by primary care.  I am going to arrange a 82-month follow-up LFT and TSH/free T4 just in case these are not checked in the interim.

## 2024-06-01 NOTE — Progress Notes (Signed)
 Cardiology Office Note:    Date:  06/01/2024   ID:  Samuel Porter, DOB July 05, 1941, MRN [id number]  PCP:  Kip Righter, MD   [facility] Providers Cardiologist:  Ozell Fell, MD Electrophysiologist:  Will Gladis Norton, MD     Referring MD: Kip Righter, MD   Chief Complaint  Patient presents with   Shortness of Breath    History of Present Illness:    Samuel Porter is a 83 y.o. male with a hx of atrial fibrillation, chronic HFrEF, and recurrent right pleural effusion, presenting for follow-up evaluation. Pt has been followed in the Afib clinic, by Dr Norton, and by Dr Zenaida with [facility]. He also has interstitial lung disease followed by Pulmonary Medicine (Dr Synthia).  The patient has had a recurrent right pleural effusion.  His daughter is conferenced in over the telephone today.  The patient denies any recent chest pain or pressure.  He has shortness of breath with activity and sometimes has to stop to catch his breath after a short walk.  He is eager to start pulmonary rehab but he has not been able to get in touch about when he is able to start.  He remains on O2 per nasal cannula and sometimes increases his oxygen to keep his saturations up when he walks.  He denies orthopnea or PND.  He has mild leg swelling and wears support hose.  No fever or chills.  When he last saw Dr. Zenaida, he was noted to have elevated TSH but normal free T4.  His levothyroxine  supplementation was increased with recommendations to follow-up with his primary care physician.   Current Medications: Current Meds  Medication Sig   acetaminophen  (TYLENOL ) 325 MG tablet Take 650 mg by mouth daily as needed for moderate pain (pain score 4-6) or mild pain (pain score 1-3).   albuterol  (VENTOLIN  HFA) 108 (90 Base) MCG/ACT inhaler Inhale 2 puffs into the lungs every 4 (four) hours as needed for shortness of breath.   amiodarone  (PACERONE ) 200 MG tablet Take 1 tablet (200 mg total) by mouth  daily.   cholecalciferol  (VITAMIN D ) 1000 units tablet Take 1,000 Units by mouth daily.   dapagliflozin  propanediol (FARXIGA ) 10 MG TABS tablet Take 1 tablet (10 mg total) by mouth daily before breakfast.   docusate sodium  (COLACE) 100 MG capsule Take 1 capsule (100 mg total) by mouth 2 (two) times daily as needed for mild constipation.   dorzolamide -timolol  (COSOPT ) 22.3-6.8 MG/ML ophthalmic solution Place 1 drop into both eyes 2 (two) times daily.   ELIQUIS  5 MG TABS tablet Take 5 mg by mouth 2 (two) times daily.   feeding supplement (BOOST HIGH PROTEIN) LIQD Take 1 Container by mouth daily.   feeding supplement (ENSURE PLUS HIGH PROTEIN) LIQD Take 237 mLs by mouth 3 (three) times daily between meals.   finasteride  (PROSCAR ) 5 MG tablet Take 5 mg by mouth daily.   furosemide  (LASIX ) 20 MG tablet Take 1 tablet (20 mg total) by mouth daily.   levothyroxine  (SYNTHROID ) 75 MCG tablet Take 1 tablet (75 mcg total) by mouth daily at 6 (six) AM.   mirtazapine  (REMERON ) 7.5 MG tablet Take 1 tablet (7.5 mg total) by mouth at bedtime.   Multiple Vitamin (MULTIVITAMIN WITH MINERALS) TABS tablet Take 1 tablet by mouth daily.   Nutritional Supplements (,FEEDING SUPPLEMENT, PROSOURCE PLUS) liquid Take 30 mLs by mouth 2 (two) times daily before lunch and supper.   polyethylene glycol (MIRALAX  / GLYCOLAX ) 17 g packet  Take 17 g by mouth 2 (two) times daily as needed.   sildenafil (VIAGRA) 25 MG tablet Take 25 mg by mouth as needed for erectile dysfunction.     Allergies:   Patient has no known allergies.   ROS:   Please see the history of present illness.    All other systems reviewed and are negative.  EKGs/Labs/Other Studies Reviewed:    The following studies were reviewed today: Cardiac Studies & Procedures   ______________________________________________________________________________________________ CARDIAC CATHETERIZATION  CARDIAC CATHETERIZATION 03/23/2024  Conclusion 1. Normal left and right  heart filling pressures. 2. Borderline elevated PA pressure. 3. Preserved cardiac output.  We can stop IV Lasix , start po Lasix  tomorrow.     ECHOCARDIOGRAM  ECHOCARDIOGRAM COMPLETE 05/28/2024  Narrative ECHOCARDIOGRAM REPORT    Patient Name:   Samuel Porter Date of Exam: 05/28/2024 Medical Rec #:  [id number]       Height:       73.0 in Accession #:    [id number]      Weight:       155.0 lb Date of Birth:  03-05-1941        BSA:          1.931 m Patient Age:    83 years        BP:           134/60 mmHg Patient Gender: M               HR:           47 bpm. Exam Location:  [address]  Procedure: 2D Echo, Cardiac Doppler and Color Doppler (Both Spectral and Color Flow Doppler were utilized during procedure).  Indications:    I48.91 Atrial Fibrillation  History:        Patient has prior history of Echocardiogram examinations, most recent 03/20/2024. CHF, Arrythmias:Atrial Fibrillation, Signs/Symptoms:Fatigue; Risk Factors:Hypertension and Former Smoker. A-Fib (new onset- Feb 2025) with Cardioversion, Pleural Effusion with Acute Respiratory Failure s/p Thoracentesis, Heart Failure with Reduced EF.  Sonographer:    Heather Hawks RDCS Referring Phys: [employer]  IMPRESSIONS   1. Left ventricular ejection fraction, by estimation, is 60 to 65%. The left ventricle has normal function. The left ventricle has no regional wall motion abnormalities. Left ventricular diastolic parameters are consistent with Grade II diastolic dysfunction (pseudonormalization). 2. Right ventricular systolic function is moderately reduced. The right ventricular size is moderately enlarged. There is moderately elevated pulmonary artery systolic pressure. The estimated right ventricular systolic pressure is 59.1 mmHg. 3. Left atrial size was severely dilated. 4. Right atrial size was severely dilated. 5. The mitral valve is normal in structure. No evidence of mitral valve regurgitation. No evidence  of mitral stenosis. 6. Tricuspid valve regurgitation is mild to moderate. Severe tricuspid stenosis. 7. The aortic valve is tricuspid. There is mild calcification of the aortic valve. Aortic valve regurgitation is not visualized. Aortic valve sclerosis/calcification is present, without any evidence of aortic stenosis. 8. Aortic dilatation noted. There is borderline dilatation of the ascending aorta, measuring 38 mm. 9. The inferior vena cava is dilated in size with <50% respiratory variability, suggesting right atrial pressure of 15 mmHg.  FINDINGS Left Ventricle: Left ventricular ejection fraction, by estimation, is 60 to 65%. The left ventricle has normal function. The left ventricle has no regional wall motion abnormalities. The left ventricular internal cavity size was normal in size. There is no left ventricular hypertrophy. Left ventricular diastolic parameters are consistent with Grade II diastolic dysfunction (pseudonormalization).  Right Ventricle: The right ventricular size is moderately enlarged. No increase in right ventricular wall thickness. Right ventricular systolic function is moderately reduced. There is moderately elevated pulmonary artery systolic pressure. The tricuspid regurgitant velocity is 3.32 m/s, and with an assumed right atrial pressure of 15 mmHg, the estimated right ventricular systolic pressure is 59.1 mmHg.  Left Atrium: Left atrial size was severely dilated.  Right Atrium: Right atrial size was severely dilated.  Pericardium: There is no evidence of pericardial effusion.  Mitral Valve: The mitral valve is normal in structure. Mild mitral annular calcification. No evidence of mitral valve regurgitation. No evidence of mitral valve stenosis.  Tricuspid Valve: The tricuspid valve is normal in structure. Tricuspid valve regurgitation is mild to moderate. Severe tricuspid stenosis.  Aortic Valve: The aortic valve is tricuspid. There is mild calcification of the  aortic valve. Aortic valve regurgitation is not visualized. Aortic valve sclerosis/calcification is present, without any evidence of aortic stenosis.  Pulmonic Valve: The pulmonic valve was normal in structure. Pulmonic valve regurgitation is trivial. No evidence of pulmonic stenosis.  Aorta: Aortic dilatation noted. There is borderline dilatation of the ascending aorta, measuring 38 mm.  Venous: The inferior vena cava is dilated in size with less than 50% respiratory variability, suggesting right atrial pressure of 15 mmHg.  IAS/Shunts: No atrial level shunt detected by color flow Doppler.   LEFT VENTRICLE PLAX 2D LVIDd:         5.10 cm   Diastology LVIDs:         3.50 cm   LV e' medial:    5.44 cm/s LV PW:         1.00 cm   LV E/e' medial:  15.1 LV IVS:        0.90 cm   LV e' lateral:   7.02 cm/s LVOT diam:     2.15 cm   LV E/e' lateral: 11.7 LV SV:         77 LV SV Index:   40 LVOT Area:     3.63 cm   RIGHT VENTRICLE RV Basal diam:  5.10 cm RV Mid diam:    4.30 cm RV S prime:     13.20 cm/s TAPSE (M-mode): 1.8 cm RVSP:           59.1 mmHg  LEFT ATRIUM              Index        RIGHT ATRIUM            Index LA diam:        4.60 cm  2.38 cm/m   RA Pressure: 15.00 mmHg LA Vol (A2C):   103.0 ml 53.35 ml/m  RA Area:     33.40 cm LA Vol (A4C):   86.2 ml  44.65 ml/m  RA Volume:   136.00 ml  70.44 ml/m LA Biplane Vol: 95.0 ml  49.20 ml/m AORTIC VALVE LVOT Vmax:   105.00 cm/s LVOT Vmean:  63.400 cm/s LVOT VTI:    0.213 m  AORTA Ao Root diam: 3.70 cm Ao Asc diam:  3.80 cm  MITRAL VALVE               TRICUSPID VALVE MV Area (PHT): cm         TR Peak grad:   44.1 mmHg MV Decel Time: 247 msec    TR Vmax:        332.00 cm/s MV E velocity: 82.30 cm/s  Estimated RAP:  15.00 mmHg MV A velocity: 34.35 cm/s  RVSP:           59.1 mmHg MV E/A ratio:  2.40 SHUNTS Systemic VTI:  0.21 m Systemic Diam: 2.15 cm  Toribio Fuel MD Electronically signed by Toribio Fuel  MD Signature Date/Time: 05/28/2024/9:23:17 PM    Final        CARDIAC MRI  MR CARDIAC MORPHOLOGY W WO CONTRAST 03/25/2024  Narrative CLINICAL DATA:  Cardiomyopathy of uncertain etiology, assess for amyloidosis  EXAM: CARDIAC MRI  TECHNIQUE: The patient was scanned on a 1.5 Tesla GE magnet. A dedicated cardiac coil was used. Functional imaging was done using Fiesta sequences. 2,3, and 4 chamber views were done to assess for RWMA's. Modified Simpson's rule using a short axis stack was used to calculate an ejection fraction on a dedicated work [employer]. The patient received 8 cc of Gadavist . After 10 minutes inversion recovery sequences were used to assess for infiltration and scar tissue.  FINDINGS: Limited images of the lung fields show a partially loculated-appearing moderate right pleural effusion. There is a small left pleural effusion. Airspace disease at bilateral bases.  Small circumferential pericardial effusion, no tamponade. Mildly dilated left ventricular size with normal wall thickness. Global hypokinesis, LV EF 51%. Moderately dilated right ventricle, RV EF 47%. Severe biatrial enlargement. Trileaflet aortic valve. Trivial mitral regurgitation, regurgitant fraction 7%.  Difficult delayed enhancement images. There is small area of mid-wall late gadolinium enhancement (LGE) at the basal-mid inferoseptal RV insertion site. Small area of <50% wall thickness subendocardial LGE at the basal inferolateral wall segment.  MEASUREMENTS: MEASUREMENTS LVEDV 230 mL  LVEDVi 119 mL/m2  LVSV 117 LVEF 51%  RVEDV 279 mL RVEDVi 144 mL/m2  RVSV 131 mL RVEF 47%  Aortic forward volume 109 mL  Aortic regurgitant fraction 0%  Global T1 1119, ECV 30%  Global T2 58 (elevated)  IMPRESSION: 1. Lung findings as above, patient had recent CT chest with complete visualization of lung fields.  2.  Small pericardial effusion without  tamponade.  3.  Mildly dilated LV with LV EF 51%, global hypokinesis.  4.  Moderately dilated RV with RV EF 47% (mildly decreased).  5.  Severe biatrial enlargement.  6. Nonspecific inferior RV insertion site LGE, this is nonspecific and can be seen with pressure/volume overload. Small area of <50% wall thickness subendocardial LGE at the basal inferolateral wall, possible coronary disease pattern.  7.  Borderline elevated extracellular volume percentage at 30%.  8.  Elevated global T2, can be seen with inflammation.  Study is not highly suggestive of cardiac amyloidosis.  Dalton Mclean   Electronically Signed By: Ezra Shuck M.D. On: 03/26/2024 07:46   ______________________________________________________________________________________________      EKG:        Recent Labs: 03/16/2024: Pro Brain Natriuretic Peptide 7,160.0 03/21/2024: B Natriuretic Peptide 1,089.6 03/26/2024: Magnesium 2.2 05/11/2024: TSH 31.098 05/12/2024: ALT 19; BUN 28; Creatinine 1.02; Potassium 4.1; Sodium 138 05/29/2024: Hemoglobin 12.4; Platelets 138  Recent Lipid Panel No results found for: CHOL, TRIG, HDL, CHOLHDL, VLDL, LDLCALC, LDLDIRECT   Risk Assessment/Calculations:    CHA2DS2-VASc Score = 4   This indicates a 4.8% annual risk of stroke. The patient's score is based upon: CHF History: 1 HTN History: 1 Diabetes History: 0 Stroke History: 0 Vascular Disease History: 0 Age Score: 2 Gender Score: 0            Physical Exam:    VS:  BP (!) 158/70   Pulse ROLLEN)  52   Ht 6' 1 (1.854 m)   Wt 155 lb (70.3 kg)   SpO2 91%   BMI 20.45 kg/m     Wt Readings from Last 3 Encounters:  06/01/24 155 lb (70.3 kg)  05/29/24 154 lb 15.7 oz (70.3 kg)  05/19/24 155 lb (70.3 kg)     GEN:  Elderly male, thin, in no acute distress, on O2 per [place] HEENT: Normal NECK: No JVD; No carotid bruits LYMPHATICS: No lymphadenopathy CARDIAC: RRR, no murmurs, rubs,  gallops RESPIRATORY: Decreased breath sounds right lung base ABDOMEN: Soft, non-tender, non-distended MUSCULOSKELETAL:  No edema; No deformity  SKIN: Warm and dry NEUROLOGIC:  Alert and oriented x 3 PSYCHIATRIC:  Normal affect   Assessment & Plan HFrEF (heart failure with reduced ejection fraction) (HCC) Suspect the patient's heart failure/reduced LV function was 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  Recent echo shows LVEF has normalized now at 60 to 65% with grade 2 diastolic dysfunction, moderately reduced RV function, and moderately elevated PA pressures estimated at 59 mmHg.  He has severe biatrial enlargement, mild to moderate tricuspid regurgitation, and no significant aortic or mitral valve disease.  Patient currently treated with Farxiga , low-dose furosemide , tolerating both well.  No changes made today. Persistent atrial fibrillation (HCC) Maintaining sinus rhythm on amiodarone .  Continue anticoagulation with apixaban .  Appreciate care of Dr. Inocencio in the atrial fibrillation clinic.  Continue current management.  Reviewed amiodarone  monitoring labs with normal LFTs but thyroid  function testing indicative of hypothyroidism.  The patient's TSH is significantly elevated, but his free T4 was normal.  Levothyroxine  was increased with plan for follow-up lab studies by primary care.  I am going to arrange a 23-month follow-up LFT and TSH/free T4 just in case these are not checked in the interim. Long term current use of antiarrhythmic drug Continue amiodarone .  Reviewed EP notes and could increase to 200 mg twice daily if needed.  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 Recurrent right pleural effusion Followed by pulmonary.  Discussed issues they have had  around initiating pulmonary rehab.  Advised that our office would reach out to them to make sure that he is on the list to get started.  He continues on oxygen.  Follow-up with Dr. Theophilus as scheduled.            Medication Adjustments/Labs and Tests Ordered: Current medicines are reviewed at length with the patient today.  Concerns regarding medicines are outlined above.  Orders Placed This Encounter  Procedures   T4, free   TSH   Hepatic function panel   No orders of the defined types were placed in this encounter.   Patient Instructions  Medication Instructions:  No medication changes were made at this visit. Continue current regimen.   *If you need a refill on your cardiac medications before your next appointment, please call your pharmacy*  Lab Work: To be completed in 6 months (prior to 6 month follow-up appt): liver function test, TSH, free T4  If you have labs (blood work) drawn today and your tests are completely normal, you will receive your results only by: MyChart Message (if you have MyChart) OR A paper copy in the mail If you have any lab test that is abnormal or we need to change your treatment, we will call you to review the  results.  Testing/Procedures: None ordered today.  Follow-Up: At [facility], you and your health needs are our priority.  As part of our continuing mission to provide you with exceptional heart care, our providers are all part of one team.  This team includes your primary Cardiologist (physician) and Advanced Practice Providers or APPs (Physician Assistants and Nurse Practitioners) who all work together to provide you with the care you need, when you need it.  Your next appointment:   6 month(s)  Provider:   Ozell Fell, MD or APP    Signed, Ozell Fell, MD  06/01/2024 12:14 PM    [facility]

## 2024-06-02 ENCOUNTER — Other Ambulatory Visit: Payer: Self-pay

## 2024-06-02 ENCOUNTER — Encounter (HOSPITAL_BASED_OUTPATIENT_CLINIC_OR_DEPARTMENT_OTHER): Payer: Self-pay

## 2024-06-02 ENCOUNTER — Ambulatory Visit: Payer: Self-pay | Admitting: Pulmonary Disease

## 2024-06-02 ENCOUNTER — Emergency Department (HOSPITAL_BASED_OUTPATIENT_CLINIC_OR_DEPARTMENT_OTHER)
Admission: EM | Admit: 2024-06-02 | Discharge: 2024-06-02 | Disposition: A | Discharge status: Home / Self Care | Attending: Emergency Medicine | Admitting: Emergency Medicine

## 2024-06-02 ENCOUNTER — Emergency Department (HOSPITAL_BASED_OUTPATIENT_CLINIC_OR_DEPARTMENT_OTHER)

## 2024-06-02 DIAGNOSIS — Z7901 Long term (current) use of anticoagulants: Secondary | ICD-10-CM | POA: Insufficient documentation

## 2024-06-02 DIAGNOSIS — J9611 Chronic respiratory failure with hypoxia: Secondary | ICD-10-CM | POA: Diagnosis not present

## 2024-06-02 DIAGNOSIS — Z79899 Other long term (current) drug therapy: Secondary | ICD-10-CM | POA: Diagnosis not present

## 2024-06-02 DIAGNOSIS — J9811 Atelectasis: Secondary | ICD-10-CM | POA: Diagnosis not present

## 2024-06-02 DIAGNOSIS — I10 Essential (primary) hypertension: Secondary | ICD-10-CM | POA: Diagnosis not present

## 2024-06-02 DIAGNOSIS — J9621 Acute and chronic respiratory failure with hypoxia: Secondary | ICD-10-CM | POA: Diagnosis not present

## 2024-06-02 DIAGNOSIS — Z9981 Dependence on supplemental oxygen: Secondary | ICD-10-CM | POA: Insufficient documentation

## 2024-06-02 DIAGNOSIS — R0609 Other forms of dyspnea: Secondary | ICD-10-CM

## 2024-06-02 DIAGNOSIS — J90 Pleural effusion, not elsewhere classified: Secondary | ICD-10-CM | POA: Diagnosis not present

## 2024-06-02 DIAGNOSIS — D649 Anemia, unspecified: Secondary | ICD-10-CM | POA: Insufficient documentation

## 2024-06-02 DIAGNOSIS — R0602 Shortness of breath: Secondary | ICD-10-CM | POA: Diagnosis not present

## 2024-06-02 LAB — BASIC METABOLIC PANEL WITH GFR
Anion gap: 9 (ref 5–15)
BUN: 27 mg/dL — ABNORMAL HIGH (ref 8–23)
CO2: 30 mmol/L (ref 22–32)
Calcium: 9.5 mg/dL (ref 8.9–10.3)
Chloride: 100 mmol/L (ref 98–111)
Creatinine, Ser: 0.94 mg/dL (ref 0.61–1.24)
GFR, Estimated: >60 mL/min (ref >60)
Glucose, Bld: 125 mg/dL — ABNORMAL HIGH (ref 70–99)
Potassium: 4.1 mmol/L (ref 3.5–5.1)
Sodium: 138 mmol/L (ref 135–145)

## 2024-06-02 LAB — CBC
HCT: 40.9 % (ref 39.0–52.0)
Hemoglobin: 12.4 g/dL — ABNORMAL LOW (ref 13.0–17.0)
MCH: 28.8 pg (ref 26.0–34.0)
MCHC: 30.3 g/dL (ref 30.0–36.0)
MCV: 95.1 fL (ref 80.0–100.0)
Platelets: 152 K/uL (ref 150–400)
RBC: 4.3 MIL/uL (ref 4.22–5.81)
RDW: 15.6 % — ABNORMAL HIGH (ref 11.5–15.5)
WBC: 4.2 K/uL (ref 4.0–10.5)
nRBC: 0 % (ref 0.0–0.2)

## 2024-06-02 LAB — TROPONIN T, HIGH SENSITIVITY: Troponin T High Sensitivity: 26 ng/L — ABNORMAL HIGH (ref 0–19)

## 2024-06-02 LAB — SURGICAL PATHOLOGY

## 2024-06-02 LAB — RESP PANEL BY RT-PCR (RSV, FLU A&B, COVID)  RVPGX2
Influenza A by PCR: NEGATIVE
Influenza B by PCR: NEGATIVE
Resp Syncytial Virus by PCR: NEGATIVE
SARS Coronavirus 2 by RT PCR: NEGATIVE

## 2024-06-02 LAB — PRO BRAIN NATRIURETIC PEPTIDE: Pro Brain Natriuretic Peptide: 3437 pg/mL — ABNORMAL HIGH (ref <300.0)

## 2024-06-02 NOTE — ED Notes (Signed)
 Called lab to to add BNP and Trop T.

## 2024-06-02 NOTE — Telephone Encounter (Signed)
 FYI Only or Action Required?: FYI only for provider.  Patient is followed in Pulmonology for Pleural Effusion, last seen on 04/09/2024 by Mannam, Praveen, MD.  Called Nurse Triage reporting Shortness of Breath.  Symptoms began several days ago.  Interventions attempted: Home oxygen use and Increased fluids/rest.  Symptoms are: gradually worsening.  Triage Disposition: Go to ED Now (Notify PCP)  Patient/caregiver understands and will follow disposition?: Yes Copied from CRM [id number]. Topic: Clinical - Red Word Triage >> Jun 02, 2024  2:59 PM Chantha C wrote: Red Word that prompted transfer to Nurse Triage: Patient's child Reche [phone] states called earlier today and has not heard from the office, regarding patient oxygen level is dropping ranging 82-90 especially walking it's in the range 80s. Caitlin is unsure if patient recognizes shortness of breath. Patient can be reached at [phone]. Patient sees Dr. Theophilus, please advise. Reason for Disposition  Oxygen level (e.g., pulse oximetry) 90% or lower  Answer Assessment - Initial Assessment Questions 1. RESPIRATORY STATUS: Describe your breathing? (e.g., wheezing, shortness of breath, unable to speak, severe coughing)      Shortness of breath with activity 2. ONSET: When did this breathing problem begin?      Most obvious over the weekend but started over the last two weeks.  3. PATTERN Does the difficult breathing come and go, or has it been constant since it started?      Comes and goes 4. SEVERITY: How bad is your breathing? (e.g., mild, moderate, severe)      Mild to moderate 5. RECURRENT SYMPTOM: Have you had difficulty breathing before? If Yes, ask: When was the last time? and What happened that time?      yes 6. CARDIAC HISTORY: Do you have any history of heart disease? (e.g., heart attack, angina, bypass surgery, angioplasty)      CHF, a fib 7. LUNG HISTORY: Do you have any history of lung disease?   (e.g., pulmonary embolus, asthma, emphysema)     Pleural effusion 8. CAUSE: What do you think is causing the breathing problem?      Daughter is unsure. Trapped lung 9. OTHER SYMPTOMS: Do you have any other symptoms? (e.g., chest pain, cough, dizziness, fever, runny nose)     Runny nose,  10. O2 SATURATION MONITOR:  Do you use an oxygen saturation monitor (pulse oximeter) at home? If Yes, ask: What is your reading (oxygen level) today? What is your usual oxygen saturation reading? (e.g., 95%)       When sitting down and have the oxygen on-he is above 90. When walking around and have the oxygen on-he is at 83% and takes a minimum of five minutes to recover.  12. TRAVEL: Have you traveled out of the country in the last month? (e.g., travel history, exposures)       no  Daughter reports that patient wears oxygen at home and at night. Oxygen saturation is dropping to low 80s with supplemental oxygen. Patient is recommended to be seen in the Emergency Department. Daughter verbalized understanding and all questions answered.  Protocols used: Breathing Difficulty-A-AH

## 2024-06-02 NOTE — Discharge Instructions (Addendum)
 Today was overall reassuring.  Your chest x-ray did not show significantly increased size of your known pleural effusion.  Discussed your care with Dr. Forrestine who is your pulmonologist whose clinic will be calling you tomorrow to coordinate close outpatient follow-up and pulmonary rehab.

## 2024-06-02 NOTE — ED Triage Notes (Signed)
 Patient reports shortness of breath on exertion. He states it has been happening for two weeks. His caregiver reports it has been happening for months. He is on home O2 and is on 1L but reprots going up to 2L. He has his O2 with him in triage.

## 2024-06-02 NOTE — ED Provider Notes (Signed)
 [place] EMERGENCY DEPARTMENT AT [facility] Provider Note   CSN: [id number] Arrival date & time: 06/02/24  1553     Patient presents with: Shortness of Breath   Samuel Porter is a 83 y.o. male.    Shortness of Breath    83 year old male with medical history significant for BPH, HTN, history of chronic right sided pleural effusion status post chest tube placement, thoracentesis, atrial fibrillation on amiodarone  therapy and Eliquis  presenting to the emergency department with dyspnea on exertion.  The patient states that he has been going on for the past 2 weeks.  His caregiver states that is been going on for months.  He is chronically on 1 to 2 L O2 at home via nasal cannula.  He states that occasionally when he walks he will drop down to the low 80s but is able to take deep breaths and his saturations will improve.  This is not necessarily an acute change but has been ongoing for a while.  He denies any chest pain.  He denies any fevers, chills or cough.  He was just seen yesterday in the office with Dr. Wonda his cardiologist and no significant medication changes were recommended.  He primarily presented at the urging of his pulmonologist for chest x-ray to further evaluate for potential worsening of his chronic pleural effusion.  He is requesting some type of coordination for outpatient pulmonary rehab.  He adamantly does not want to come into the hospital today.  No significant lower extremity swelling.  He has been compliant with both his Eliquis  and his daily Lasix .   Prior to Admission medications   Medication Sig Start Date End Date Taking? Authorizing Provider  acetaminophen  (TYLENOL ) 325 MG tablet Take 650 mg by mouth daily as needed for moderate pain (pain score 4-6) or mild pain (pain score 1-3).    [provider]  albuterol  (VENTOLIN  HFA) 108 (90 Base) MCG/ACT inhaler Inhale 2 puffs into the lungs every 4 (four) hours as needed for shortness of breath.  11/22/23   [provider]  amiodarone  (PACERONE ) 200 MG tablet Take 1 tablet (200 mg total) by mouth daily. 05/19/24   Camnitz, Soyla Lunger, MD  cholecalciferol  (VITAMIN D ) 1000 units tablet Take 1,000 Units by mouth daily.    [provider]  dapagliflozin  propanediol (FARXIGA ) 10 MG TABS tablet Take 1 tablet (10 mg total) by mouth daily before breakfast. 02/26/24   Wonda Sharper, MD  docusate sodium  (COLACE) 100 MG capsule Take 1 capsule (100 mg total) by mouth 2 (two) times daily as needed for mild constipation. 03/27/24   Elgergawy, Brayton RAMAN, MD  dorzolamide -timolol  (COSOPT ) 22.3-6.8 MG/ML ophthalmic solution Place 1 drop into both eyes 2 (two) times daily. 07/09/16   [provider]  ELIQUIS  5 MG TABS tablet Take 5 mg by mouth 2 (two) times daily.    [provider]  feeding supplement (BOOST HIGH PROTEIN) LIQD Take 1 Container by mouth daily.    [provider]  feeding supplement (ENSURE PLUS HIGH PROTEIN) LIQD Take 237 mLs by mouth 3 (three) times daily between meals. 03/27/24   Elgergawy, Brayton RAMAN, MD  finasteride  (PROSCAR ) 5 MG tablet Take 5 mg by mouth daily.    [provider]  furosemide  (LASIX ) 20 MG tablet Take 1 tablet (20 mg total) by mouth daily. 02/07/24 06/01/24  Wonda Sharper, MD  levothyroxine  (SYNTHROID ) 75 MCG tablet Take 1 tablet (75 mcg total) by mouth daily at 6 (six) AM. 05/14/24  Zenaida Morene PARAS, MD  mirtazapine  (REMERON ) 7.5 MG tablet Take 1 tablet (7.5 mg total) by mouth at bedtime. 03/27/24   Elgergawy, Brayton RAMAN, MD  Multiple Vitamin (MULTIVITAMIN WITH MINERALS) TABS tablet Take 1 tablet by mouth daily. 03/27/24   Elgergawy, Brayton RAMAN, MD  Nutritional Supplements (,FEEDING SUPPLEMENT, PROSOURCE PLUS) liquid Take 30 mLs by mouth 2 (two) times daily before lunch and supper. 03/27/24   Elgergawy, Brayton RAMAN, MD  polyethylene glycol (MIRALAX  / GLYCOLAX ) 17 g packet Take 17 g by mouth 2 (two) times daily as needed. 07/16/23    Maczis, Michael M, PA-C  sildenafil (VIAGRA) 25 MG tablet Take 25 mg by mouth as needed for erectile dysfunction.    [provider]    Allergies: Patient has no known allergies.    Review of Systems  Respiratory:  Positive for shortness of breath.   All other systems reviewed and are negative.   Updated Vital Signs BP 129/65 (BP Location: Right Arm)   Pulse (!) 50   Temp 97.8 F (36.6 C) (Oral)   Resp 18   SpO2 94%   Physical Exam Vitals and nursing note reviewed.  Constitutional:      General: He is not in acute distress.    Appearance: He is well-developed. He is not ill-appearing.  HENT:     Head: Normocephalic and atraumatic.  Eyes:     Conjunctiva/sclera: Conjunctivae normal.  Cardiovascular:     Rate and Rhythm: Normal rate and regular rhythm.     Heart sounds: No murmur heard. Pulmonary:     Effort: Pulmonary effort is normal. No respiratory distress.     Breath sounds: Normal breath sounds. No rales.     Comments: On O2 via nasal cannula, saturating well and in no respiratory distress Abdominal:     Palpations: Abdomen is soft.     Tenderness: There is no abdominal tenderness.  Musculoskeletal:        General: No swelling.     Cervical back: Neck supple.     Right lower leg: No edema.     Left lower leg: No edema.  Skin:    General: Skin is warm and dry.     Capillary Refill: Capillary refill takes less than 2 seconds.  Neurological:     Mental Status: He is alert.  Psychiatric:        Mood and Affect: Mood normal.     (all labs ordered are listed, but only abnormal results are displayed) Labs Reviewed  BASIC METABOLIC PANEL WITH GFR - Abnormal; Notable for the following components:      Result Value   Glucose, Bld 125 (*)    BUN 27 (*)    All other components within normal limits  CBC - Abnormal; Notable for the following components:   Hemoglobin 12.4 (*)    RDW 15.6 (*)    All other components within normal limits  PRO BRAIN  NATRIURETIC PEPTIDE - Abnormal; Notable for the following components:   Pro Brain Natriuretic Peptide 3,437.0 (*)    All other components within normal limits  TROPONIN T, HIGH SENSITIVITY - Abnormal; Notable for the following components:   Troponin T High Sensitivity 26 (*)    All other components within normal limits  RESP PANEL BY RT-PCR (RSV, FLU A&B, COVID)  [id number]    EKG: EKG Interpretation Date/Time:  Tuesday June 02 2024 16:24:34 EDT Ventricular Rate:  62 PR Interval:  172 QRS Duration:  82 QT Interval:  454  QTC Calculation: 460 R Axis:   58  Text Interpretation: Sinus rhythm No STEMI No significant change since last tracing Confirmed by Jerrol Agent ([id number]) on 06/02/2024 4:56:21 PM  Radiology: DG Chest Portable 1 View Result Date: 06/02/2024 CLINICAL DATA:  Shortness of breath EXAM: PORTABLE CHEST 1 VIEW COMPARISON:  Chest radiograph April 09, 2024 FINDINGS: Persistent blunting of right costophrenic angle and pleural effusion with subjacent atelectasis of right lower lobe. Diffuse interstitial reticulonodular opacities throughout both lungs, increased to prior. No pleural effusion on the left. No significant pneumothorax. The cardiomediastinal silhouette is mildly enlarged and obscured by overlying pulmonary opacities. Postsurgical changes in right upper quadrant and right axillary. IMPRESSION: Increased interstitial opacities with persistent right-sided small pleural effusion and atelectatic/fibrotic changes of right costophrenic angle. Electronically Signed   By: Megan  Zare M.D.   On: 06/02/2024 16:33     Procedures   Medications Ordered in the ED - No data to display                                  Medical Decision Making Amount and/or Complexity of Data Reviewed Labs: ordered. Radiology: ordered.    83 year old male with medical history significant for BPH, HTN, history of chronic right sided pleural effusion status post chest tube placement, thoracentesis,  atrial fibrillation on amiodarone  therapy and Eliquis  presenting to the emergency department with dyspnea on exertion.  The patient states that he has been going on for the past 2 weeks.  His caregiver states that is been going on for months.  He is chronically on 1 to 2 L O2 at home via nasal cannula.  He states that occasionally when he walks he will drop down to the low 80s but is able to take deep breaths and his saturations will improve.  This is not necessarily an acute change but has been ongoing for a while.  He denies any chest pain.  He denies any fevers, chills or cough.  He was just seen yesterday in the office with Dr. Wonda his cardiologist and no significant medication changes were recommended.  He primarily presented at the urging of his pulmonologist for chest x-ray to further evaluate for potential worsening of his chronic pleural effusion.  He is requesting some type of coordination for outpatient pulmonary rehab.  He adamantly does not want to come into the hospital today.  Arrival, the patient was afebrile, bradycardic heart rate 50, not tachypneic RR 20, BP 148/59, saturating 92% on 2 L O2 via nasal cannula which is his baseline home O2.  Workup initiated to include EKG, chest x-ray and screening labs.  EKG: Sinus rhythm, ventricular rate 62, no STEMI, no acute ischemic changes.  Chest x-ray: Increased interstitial opacities were persistent right sided small pleural effusion and atelectatic/fibrotic changes of the right costophrenic angle.  Labs: BNP elevated to 3437 however downtrending from over 7002 months ago.  Cardiac troponin 26, COVID, flu, RSV PCR testing negative, BMP unremarkable, CBC without a leukocytosis, mild anemia to 12.4 noted has baseline for the patient.  Low concern for breakthrough PE, low concern for CHF exacerbation requiring inpatient admission at the time and patient would refuse admission if this were to be the case.  Had just seen his cardiologist and no  changes to his diuretic regimen was recommended, BNP is downtrending and the patient is not in any respiratory distress.  He is saturating well on his baseline home O2.  Low concern for ACS, cardiac troponin 26, patient without acute is changes on EKG, denies any chest pain at this time.  Chest x-ray with persistent right-sided small pleural effusion, not significantly increased in size.  Patient without productive cough, fever or leukocytosis to suggest developing pneumonia, viral PCR testing was negative for COVID and flu.  Discussed with Dr. Theophilus who reviewed the patient's presentation and agrees that the patient is stable for discharge and continued outpatient pulmonology management.  He was just seen by his cardiologist yesterday and no changes in his diuretic corrected regimen were recommended.  Patient is requesting pulmonary rehab.  Pulmonology clinic will call him tomorrow to coordinate continued outpatient care.   Patient comfortable with this plan, stable for discharge.     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

## 2024-06-03 ENCOUNTER — Ambulatory Visit: Payer: Self-pay | Admitting: Pulmonary Disease

## 2024-06-03 NOTE — Telephone Encounter (Addendum)
 FYI Only or Action Required?: FYI only for provider.  Patient is followed in Pulmonology for ILD and pleural effusion, last seen on 04/09/2024 by Mannam, Praveen, MD.  Called Nurse Triage reporting low oxygen levels, Shortness of Breath, and Hospitalization Follow-up.  Symptoms began several weeks ago.  Interventions attempted: Home oxygen use and Other: ED visit yesterday.  Symptoms are: gradually worsening.  Triage Disposition: See Physician Within 24 Hours (overriding Call PCP Now)  Patient/caregiver understands and will follow disposition?: Yes     Daughter on phone for pt, not currently with pt,  daughter made appt after multiple attempts to connect pt into call, daughter later connects pt phone into call, so rescheduled appt made by daughter because pt has conflict with another appt time tomorrow morning, rescheduled for tomorrow afternoon.  Copied from CRM [id number]. Topic: Clinical - Red Word Triage >> Jun 03, 2024 11:32 AM Samuel Porter wrote: Red Word that prompted transfer to Nurse Triage: Trouble breathing - if he gets up to walk its below 90. Reason for Disposition  [1] Monitoring oxygen level (e.g., pulse oximetry) AND [2] fall in oxygen level 4% or more (below known patient baseline, while awake and resting) AND [3] new difficulty breathing or worse than when discharged from hospital  Answer Assessment - Initial Assessment Questions E2C2 Pulmonary Triage - Initial Assessment Questions Chief Complaint (e.g., cough, sob, wheezing, fever, chills, sweat or additional symptoms) *Go to specific symptom protocol after initial questions. Called yesterday and NT advised ED, went to ED, ED want him to see pulm since not acute and PCP, PCP called earlier today, kicking it to pulm Not heard back from pulm offices or pulm rehab, daughter trying to be proactive for him No chest pain, dizziness, or weaker than usual, but also not Porter complainer and doesn't register that stuff, seems to be  doing fine No struggling with breath for each word If go across house, he'll go below 90% with oxygen on Taking longer for his pulse ox to come back up [employer] from car down to 82-83%, takes longer and longer to get above 90% for longer than 4 min, had been down to 88% for that then back up after Porter minute His PT has noticed that he's going below 90% during PT Not coughing up anything, no fever, no coughing fits Friday little bit of cough, would sit up cough and clear it then fine  How long have symptoms been present? Been at least Porter week like this, for 1-2 weeks before that just been noticing this decline  Have you used your inhalers/maintenance medication? No  OXYGEN: Do you wear supplemental oxygen? Yes If yes, How many liters are you supposed to use? All the time, back up to 2L, had bumped him down to 1L then started seeing this trend  Do you monitor your oxygen levels? Yes  What is your usual oxygen saturation reading?  (Note: Pulmonary O2 sats should be 90% or greater) If sitting there with oxygen on, fine  Protocols used: COPD Post-Hospitalization Follow-up Call-Porter-[facility]

## 2024-06-03 NOTE — Telephone Encounter (Signed)
 Pt was admitted to the ER yesterday. NFN

## 2024-06-04 ENCOUNTER — Encounter: Payer: Self-pay | Admitting: Pulmonary Disease

## 2024-06-04 ENCOUNTER — Ambulatory Visit: Admitting: Pulmonary Disease

## 2024-06-04 VITALS — BP 156/65 | Temp 97.6°F | Ht 73.0 in | Wt 152.6 lb

## 2024-06-04 DIAGNOSIS — J849 Interstitial pulmonary disease, unspecified: Secondary | ICD-10-CM | POA: Diagnosis not present

## 2024-06-04 DIAGNOSIS — D0461 Carcinoma in situ of skin of right upper limb, including shoulder: Secondary | ICD-10-CM | POA: Diagnosis not present

## 2024-06-04 LAB — C-REACTIVE PROTEIN: CRP: 2.9 mg/dL (ref 0.5–20.0)

## 2024-06-04 LAB — SEDIMENTATION RATE: Sed Rate: 35 mm/h — ABNORMAL HIGH (ref 0–20)

## 2024-06-04 NOTE — Patient Instructions (Addendum)
 Nice to see you again  Chest x-ray other day looks like there is some food there  Increase your Lasix  to 20 mg twice a day morning and early afternoon for the next 3 days starting Friday through the weekend  Resume your 20 mg once a day on Monday  Send me message Monday let me know how you are feeling if things are better then we have our answer  We will get blood work today in anticipation of possibly needing to start steroids in the next few days  Return to clinic in 4 weeks or sooner as needed with Dr. Annella

## 2024-06-04 NOTE — Progress Notes (Signed)
 @Patient  ID: Samuel Porter, male    DOB: [birth year], 83 y.o.   MRN: [id number]  Chief Complaint  Patient presents with   Acute Visit    SOB and low o2 levels. Pt was in ED 06/02/24.  Pt reports that is pulse ox. Gives him a reading of the lower 80's with exertion while he's on his 2L pulse o2.    Referring provider: Kip Righter, MD  HPI:   83 y.o. 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  Worsening symptoms last few days.  Needing more oxygen .  Taken diuretics.  Low-dose.  Recent chest x-ray looks wet to me with similar appearing effusions.  We discussed this at length most likely culprit.  Need increased diuresis.  Discussed possible ILD flare is seems less likely but can treat with prednisone  in the coming days if not improved with increased Lasix .  Questionaires / Pulmonary Flowsheets:   ACT:      No data to display          MMRC:     No data to display          Epworth:      No data to display          Tests:   FENO:  No results found for: NITRICOXIDE  PFT:     No data to display          WALK:      No data to display          Imaging: Personally reviewed DG Chest Portable 1 View Result Date: 06/02/2024 CLINICAL DATA:  Shortness of breath EXAM: PORTABLE CHEST 1 VIEW COMPARISON:  Chest radiograph April 09, 2024 FINDINGS: Persistent blunting of right costophrenic angle and pleural effusion with subjacent atelectasis of right lower lobe. Diffuse interstitial reticulonodular opacities throughout both lungs, increased to prior. No pleural effusion on the left. No significant pneumothorax. The cardiomediastinal silhouette is mildly enlarged and obscured by overlying pulmonary opacities. Postsurgical changes in right upper quadrant and right axillary.  IMPRESSION: Increased interstitial opacities with persistent right-sided small pleural effusion and atelectatic/fibrotic changes of right costophrenic angle. Electronically Signed   By: Samuel  Porter M.D.   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 Need for bone marrow biopsy. EXAM: CT GUIDED BONE MARROW ASPIRATION AND BIOPSY ANESTHESIA/SEDATION: Moderate (conscious) sedation was employed during this procedure. A total of Versed  2.0 mg and Fentanyl  50 mcg was administered intravenously. Moderate Sedation Time: 12 minutes. The patient's level of consciousness and vital signs were monitored continuously by radiology nursing throughout the procedure under my direct supervision. PROCEDURE: The procedure risks, benefits, and alternatives were explained to the patient. Questions regarding the procedure were encouraged and answered. The patient understands and consents to the procedure. A time out was performed prior to initiating the procedure. The right gluteal region was prepped with chlorhexidine . Sterile gown and sterile gloves were used for the procedure. Local anesthesia was provided with 1% Lidocaine . Under CT guidance, an 11 gauge On Control bone cutting needle was advanced from a posterior approach into the right iliac bone. Needle positioning was confirmed with CT. Initial non heparinized and heparinized aspirate samples were obtained of bone marrow. Core biopsy was performed via the On Control drill  needle. COMPLICATIONS: None FINDINGS: Inspection of initial aspirate did reveal visible particles. Intact core biopsy sample was obtained. IMPRESSION: CT guided bone marrow biopsy of right posterior iliac bone with both aspirate and core samples obtained. Electronically Signed   By: Samuel Porter M.D.   On: 05/29/2024 11:41   ECHOCARDIOGRAM COMPLETE Result Date: 05/28/2024    ECHOCARDIOGRAM REPORT   Patient  Name:   Samuel Porter Date of Exam: 05/28/2024 Medical Rec #:  [id number]       Height:       73.0 in Accession #:    [id number]      Weight:       155.0 lb Date of Birth:  1941/03/24        BSA:          1.931 m Patient Age:    83 years        BP:           134/60 mmHg Patient Gender: M               HR:           47 bpm. Exam Location:  [address] Procedure: 2D Echo, Cardiac Doppler and Color Doppler (Both Spectral and Color            Flow Doppler were utilized during procedure). Indications:    I48.91 Atrial Fibrillation  History:        Patient has prior history of Echocardiogram examinations, most                 recent 03/20/2024. CHF, Arrythmias:Atrial Fibrillation,                 Signs/Symptoms:Fatigue; Risk Factors:Hypertension and Former                 Smoker. A-Fib (new onset- Feb 2025) with Cardioversion, Pleural                 Effusion with Acute Respiratory Failure s/p Thoracentesis, Heart                 Failure with Reduced EF.  Sonographer:    Samuel Porter RDCS Referring Phys: Samuel Porter IMPRESSIONS  1. Left ventricular ejection fraction, by estimation, is 60 to 65%. The left ventricle has normal function. The left ventricle has no regional wall motion abnormalities. Left ventricular diastolic parameters are consistent with Grade II diastolic dysfunction (pseudonormalization).  2. Right ventricular systolic function is moderately reduced. The right ventricular size is moderately enlarged. There is moderately elevated pulmonary artery systolic pressure. The estimated right ventricular systolic pressure is 59.1 mmHg.  3. Left atrial size was severely dilated.  4. Right atrial size was severely dilated.  5. The mitral valve is normal in structure. No evidence of mitral valve regurgitation. No evidence of mitral stenosis.  6. Tricuspid valve regurgitation is mild to moderate. Severe tricuspid stenosis.  7. The aortic valve is tricuspid. There is mild calcification of the aortic valve. Aortic  valve regurgitation is not visualized. Aortic valve sclerosis/calcification is present, without any evidence of aortic stenosis.  8. Aortic dilatation noted. There is borderline dilatation of the ascending aorta, measuring 38 mm.  9. The inferior vena cava is dilated in size with <50% respiratory variability, suggesting right atrial pressure of 15 mmHg. FINDINGS  Left Ventricle: Left ventricular ejection fraction, by estimation, is 60 to 65%. The left ventricle has normal function. The left ventricle has no regional wall motion abnormalities. The left  ventricular internal cavity size was normal in size. There is  no left ventricular hypertrophy. Left ventricular diastolic parameters are consistent with Grade II diastolic dysfunction (pseudonormalization). Right Ventricle: The right ventricular size is moderately enlarged. No increase in right ventricular wall thickness. Right ventricular systolic function is moderately reduced. There is moderately elevated pulmonary artery systolic pressure. The tricuspid  regurgitant velocity is 3.32 m/s, and with an assumed right atrial pressure of 15 mmHg, the estimated right ventricular systolic pressure is 59.1 mmHg. Left Atrium: Left atrial size was severely dilated. Right Atrium: Right atrial size was severely dilated. Pericardium: There is no evidence of pericardial effusion. Mitral Valve: The mitral valve is normal in structure. Mild mitral annular calcification. No evidence of mitral valve regurgitation. No evidence of mitral valve stenosis. Tricuspid Valve: The tricuspid valve is normal in structure. Tricuspid valve regurgitation is mild to moderate. Severe tricuspid stenosis. Aortic Valve: The aortic valve is tricuspid. There is mild calcification of the aortic valve. Aortic valve regurgitation is not visualized. Aortic valve sclerosis/calcification is present, without any evidence of aortic stenosis. Pulmonic Valve: The pulmonic valve was normal in structure. Pulmonic  valve regurgitation is trivial. No evidence of pulmonic stenosis. Aorta: Aortic dilatation noted. There is borderline dilatation of the ascending aorta, measuring 38 mm. Venous: The inferior vena cava is dilated in size with less than 50% respiratory variability, suggesting right atrial pressure of 15 mmHg. IAS/Shunts: No atrial level shunt detected by color flow Doppler.  LEFT VENTRICLE PLAX 2D LVIDd:         5.10 cm   Diastology LVIDs:         3.50 cm   LV e' medial:    5.44 cm/s LV PW:         1.00 cm   LV E/e' medial:  15.1 LV IVS:        0.90 cm   LV e' lateral:   7.02 cm/s LVOT diam:     2.15 cm   LV E/e' lateral: 11.7 LV SV:         77 LV SV Index:   40 LVOT Area:     3.63 cm  RIGHT VENTRICLE RV Basal diam:  5.10 cm RV Mid diam:    4.30 cm RV S prime:     13.20 cm/s TAPSE (M-mode): 1.8 cm RVSP:           59.1 mmHg LEFT ATRIUM              Index        RIGHT ATRIUM            Index LA diam:        4.60 cm  2.38 cm/m   RA Pressure: 15.00 mmHg LA Vol (A2C):   103.0 ml 53.35 ml/m  RA Area:     33.40 cm LA Vol (A4C):   86.2 ml  44.65 ml/m  RA Volume:   136.00 ml  70.44 ml/m LA Biplane Vol: 95.0 ml  49.20 ml/m  AORTIC VALVE LVOT Vmax:   105.00 cm/s LVOT Vmean:  63.400 cm/s LVOT VTI:    0.213 m  AORTA Ao Root diam: 3.70 cm Ao Asc diam:  3.80 cm MITRAL VALVE               TRICUSPID VALVE MV Area (PHT): cm         TR Peak grad:   44.1 mmHg MV Decel Time: 247 msec    TR Vmax:  332.00 cm/s MV E velocity: 82.30 cm/s  Estimated RAP:  15.00 mmHg MV A velocity: 34.35 cm/s  RVSP:           59.1 mmHg MV E/A ratio:  2.40                            SHUNTS                            Systemic VTI:  0.21 m                            Systemic Diam: 2.15 cm Toribio Fuel MD Electronically signed by Toribio Fuel MD Signature Date/Time: 05/28/2024/9:23:17 PM    Final     Lab Results:  CBC    Component Value Date/Time   WBC 4.2 06/02/2024 1600   RBC 4.30 06/02/2024 1600   HGB 12.4 (L) 06/02/2024 1600    HGB 12.0 (L) 04/13/2024 1245   HCT 40.9 06/02/2024 1600   HCT 37.8 04/13/2024 1245   PLT 152 06/02/2024 1600   PLT 191 04/13/2024 1245   MCV 95.1 06/02/2024 1600   MCV 90 04/13/2024 1245   MCH 28.8 06/02/2024 1600   MCHC 30.3 06/02/2024 1600   RDW 15.6 (H) 06/02/2024 1600   RDW 14.2 04/13/2024 1245   LYMPHSABS 0.6 (L) 05/29/2024 0740   MONOABS 0.9 05/29/2024 0740   EOSABS 0.1 05/29/2024 0740   BASOSABS 0.0 05/29/2024 0740    BMET    Component Value Date/Time   NA 138 06/02/2024 1600   NA 139 04/13/2024 1245   K 4.1 06/02/2024 1600   CL 100 06/02/2024 1600   CO2 30 06/02/2024 1600   GLUCOSE 125 (H) 06/02/2024 1600   BUN 27 (H) 06/02/2024 1600   BUN 27 04/13/2024 1245   CREATININE 0.94 06/02/2024 1600   CREATININE 1.02 05/12/2024 1253   CALCIUM 9.5 06/02/2024 1600   GFRNONAA >60 06/02/2024 1600   GFRNONAA >60 05/12/2024 1253   GFRAA >60 06/17/2018 0457    BNP    Component Value Date/Time   BNP 1,089.6 (H) 03/21/2024 0548    ProBNP    Component Value Date/Time   PROBNP 3,437.0 (H) 06/02/2024 1609    Specialty Problems       Pulmonary Problems   Acute respiratory failure (HCC)   Pleural effusion   Pleural effusion on right   Recurrent right pleural effusion   ILD (interstitial lung disease) (HCC)    No Known Allergies  Immunization History  Administered Date(s) Administered   Influenza-Unspecified 08/16/2023   PFIZER Comirnaty (Gray Top)Covid-19 Tri-Sucrose Vaccine 05/05/2021   PFIZER(Purple Top)SARS-COV-2 Vaccination 11/04/2019, 11/22/2019, 10/01/2020    Past Medical History:  Diagnosis Date   Arthritis    oa   BPH (benign prostatic hyperplasia)    Cancer (HCC) 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   Cataracts, bilateral    Glaucoma    Hypertension    Vitamin D  deficiency     Tobacco History: Social History   Tobacco Use  Smoking Status Former   Current packs/day: 0.00   Average packs/day: 1.5 packs/day for 10.0 years (15.0 ttl  pk-yrs)   Types: Cigarettes   Start date: 69   Quit date: 1970   Years since quitting: 55.6  Smokeless Tobacco Never   Counseling given: Not Answered   Continue to not  smoke  Outpatient Encounter Medications as of 06/04/2024  Medication Sig   acetaminophen  (TYLENOL ) 325 MG tablet Take 650 mg by mouth daily as needed for moderate pain (pain score 4-6) or mild pain (pain score 1-3).   albuterol  (VENTOLIN  HFA) 108 (90 Base) MCG/ACT inhaler Inhale 2 puffs into the lungs every 4 (four) hours as needed for shortness of breath.   amiodarone  (PACERONE ) 200 MG tablet Take 1 tablet (200 mg total) by mouth daily.   cholecalciferol  (VITAMIN D ) 1000 units tablet Take 1,000 Units by mouth daily.   dapagliflozin  propanediol (FARXIGA ) 10 MG TABS tablet Take 1 tablet (10 mg total) by mouth daily before breakfast.   docusate sodium  (COLACE) 100 MG capsule Take 1 capsule (100 mg total) by mouth 2 (two) times daily as needed for mild constipation.   dorzolamide -timolol  (COSOPT ) 22.3-6.8 MG/ML ophthalmic solution Place 1 drop into both eyes 2 (two) times daily.   ELIQUIS  5 MG TABS tablet Take 5 mg by mouth 2 (two) times daily.   feeding supplement (BOOST HIGH PROTEIN) LIQD Take 1 Container by mouth daily.   feeding supplement (ENSURE PLUS HIGH PROTEIN) LIQD Take 237 mLs by mouth 3 (three) times daily between meals.   finasteride  (PROSCAR ) 5 MG tablet Take 5 mg by mouth daily.   furosemide  (LASIX ) 20 MG tablet Take 1 tablet (20 mg total) by mouth daily.   levothyroxine  (SYNTHROID ) 75 MCG tablet Take 1 tablet (75 mcg total) by mouth daily at 6 (six) AM.   mirtazapine  (REMERON ) 7.5 MG tablet Take 1 tablet (7.5 mg total) by mouth at bedtime.   Multiple Vitamin (MULTIVITAMIN WITH MINERALS) TABS tablet Take 1 tablet by mouth daily.   Nutritional Supplements (,FEEDING SUPPLEMENT, PROSOURCE PLUS) liquid Take 30 mLs by mouth 2 (two) times daily before lunch and supper.   polyethylene glycol (MIRALAX  / GLYCOLAX ) 17  g packet Take 17 g by mouth 2 (two) times daily as needed.   sildenafil (VIAGRA) 25 MG tablet Take 25 mg by mouth as needed for erectile dysfunction.   No facility-administered encounter medications on file as of 06/04/2024.     Review of Systems  Review of Systems  No chest pain with exertion orthopnea or PND comprehensive review of systems otherwise negative Physical Exam  BP (!) 156/65   Temp 97.6 F (36.4 C)   Ht 6' 1 (1.854 m)   Wt 152 lb 9.6 oz (69.2 kg)   SpO2 94% Comment: 2L pulse o2  BMI 20.13 kg/m   Wt Readings from Last 5 Encounters:  06/04/24 152 lb 9.6 oz (69.2 kg)  06/01/24 155 lb (70.3 kg)  05/29/24 154 lb 15.7 oz (70.3 kg)  05/19/24 155 lb (70.3 kg)  05/12/24 154 lb 4.8 oz (70 kg)    BMI Readings from Last 5 Encounters:  06/04/24 20.13 kg/m  06/01/24 20.45 kg/m  05/29/24 20.45 kg/m  05/19/24 20.45 kg/m  05/12/24 20.36 kg/m     Physical Exam General: Chronically ill-appearing sitting up in chair Eyes: EOMI Neck: Supple, no JVP appreciated Pulmonary: Diminished in bilateral bases, normal work of breathing   Assessment & Plan:   Acute on chronic hypoxic respiratory failure with worsening dyspnea: High suspicion for acute decompensation related to ongoing fluid overload.  BNP remains markedly elevated.  His chest x-ray shows residual right-sided effusion, certainly this could be developing 5 thorax with persistent hydropneumothorax in the past.  He has some lower extremity edema.  Will check sed rate, CRP.  If elevated would argue for  course of prednisone  for his underlying interstitial lung disease.  I wonder if this is more post-COVID and unlikely to worsen over time but is certainly a possibility.  Increase Lasix  to twice daily from once daily for next 3 days and assess response.   Return in about 4 weeks (around 07/02/2024) for f/u Dr. Annella.   Samuel JONELLE Annella, MD 06/04/2024

## 2024-06-07 ENCOUNTER — Encounter: Payer: Self-pay | Admitting: Pulmonary Disease

## 2024-06-08 ENCOUNTER — Encounter: Payer: Self-pay | Admitting: Hematology and Oncology

## 2024-06-08 ENCOUNTER — Telehealth: Payer: Self-pay | Admitting: Hematology and Oncology

## 2024-06-08 ENCOUNTER — Encounter (HOSPITAL_COMMUNITY): Payer: Self-pay | Admitting: Hematology and Oncology

## 2024-06-08 MED ORDER — PREDNISONE 10 MG PO TABS: ORAL_TABLET | ORAL | 0 refills | Status: AC

## 2024-06-08 NOTE — Telephone Encounter (Signed)
 I LVM informing Samuel Porter of his re-scheduled appointment. I asked Judea to return my call if 9/4 does not work for him.

## 2024-06-08 NOTE — Telephone Encounter (Signed)
 Prednisone  taper sent with elevated sed rate, CRP WNL.  Can you assist scheduling this gentleman for OV in next 1-2 weeks with APP or myself? Thanks!

## 2024-06-08 NOTE — Telephone Encounter (Signed)
**Note De-identified  [place] Obfuscation** Please advise 

## 2024-06-09 ENCOUNTER — Telehealth: Payer: Self-pay

## 2024-06-09 NOTE — Telephone Encounter (Signed)
 Lm for patient to schedule appt

## 2024-06-09 NOTE — Telephone Encounter (Signed)
 Pt returned call. Pt informed of Dr [name]'s note and verbalized understanding. Pt was scheduled for September 10th at 3pm with Dr Annella. NFN

## 2024-06-10 ENCOUNTER — Encounter (HOSPITAL_COMMUNITY): Payer: Self-pay | Admitting: Hematology and Oncology

## 2024-06-10 NOTE — Telephone Encounter (Signed)
 Error

## 2024-06-11 ENCOUNTER — Telehealth (HOSPITAL_COMMUNITY): Payer: Self-pay

## 2024-06-11 NOTE — Telephone Encounter (Signed)
 Pt insurance is active and benefits verified through [facility]. Co-pay $0, DED $0/$0 met, out of pocket $4,150/$4,150 met, co-insurance 0%. No pre-authorization required. 06/11/2024 @ 9:30am, spoke with Arletta, REF# [id number].

## 2024-06-11 NOTE — Telephone Encounter (Signed)
 Attempted to call patient to schedule pulmonary rehab- no answer, left message. Sent MyChart message.

## 2024-06-11 NOTE — Telephone Encounter (Signed)
 Patient called back to get scheduled in the Pulmonary Rehab Program. Patient will come in for orientation on 9/08 and will attend the 10:15 exercise class.  Sent MyChart message.

## 2024-06-18 ENCOUNTER — Inpatient Hospital Stay: Attending: Hematology and Oncology | Admitting: Hematology and Oncology

## 2024-06-18 VITALS — BP 122/66 | HR 63 | Temp 98.6°F | Resp 13 | Wt 153.7 lb

## 2024-06-18 DIAGNOSIS — Z9221 Personal history of antineoplastic chemotherapy: Secondary | ICD-10-CM | POA: Diagnosis not present

## 2024-06-18 DIAGNOSIS — D472 Monoclonal gammopathy: Secondary | ICD-10-CM | POA: Diagnosis not present

## 2024-06-18 DIAGNOSIS — Z85828 Personal history of other malignant neoplasm of skin: Secondary | ICD-10-CM | POA: Insufficient documentation

## 2024-06-18 NOTE — Progress Notes (Signed)
 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  He elected to be treated at [facility]  He was most recently admitted with lateral lower extremity edema, pleural effusion which was thought to be transudative and he is referred back to hematology for evaluation of possible amyloidosis.  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 1% annual risk of progression to myeloma. - Monitor M protein levels in 6 months, if stable, repeat labs yearly. - Schedule follow-up appointment in six months for blood work.  Pulmonary hypertension Potential contributor to right heart disease. - Continue with respiratory therapy program. - Follow up with pulmonary specialist next week.  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 Pulmonary fibrosis present, residual pleural effusion not significant. - Continue monitoring by pulmonary specialists.  Bilateral leg edema, improving Improving bilateral leg edema, likely related to pulmonary hypertension and right heart disease. - Continue monitoring for symptoms of  heart failure.   HISTORY OF PRESENTING ILLNESS:   Samuel Porter 83 y.o. 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  He is here today with his daughter and another daughter was on the phone.  Discussed the use of AI scribe software for clinical note transcription with the patient, who gave verbal consent to proceed.  History of Present Illness  Samuel Porter is an 83 year old male with MGUS who presents for follow-up on his hematological condition.  He has been experiencing slow but steady improvements in his overall health. Recent cardiology evaluations showed improvements in his echocardiogram and EKG compared to three or four months ago. He is scheduled to start a respiratory therapy program and has an upcoming appointment with a pulmonary specialist.  Recent bone marrow testing did not show amyloidosis or multiple myeloma, although abnormal plasma cells were present at seven percent, which does not meet the criteria for myeloma.  He experiences pulmonary issues, including shortness of breath, which initially led to fluid accumulation around the lungs. This fluid has been drained twice, and recent evaluations show minimal remaining fluid. There is concern about scar tissue in the lungs, which is being monitored by pulmonary specialists.  He has a history of pulmonary hypertension and right heart disease, which has led to severe bilateral leg swelling. His condition is improving, albeit slowly.  Rest of the pertinent systems were reviewed with the patient and are negative.  MEDICAL HISTORY:  Past Medical History:  Diagnosis Date   Arthritis    oa   BPH (  benign  prostatic hyperplasia)    Cancer (HCC) 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   Cataracts, bilateral    Glaucoma    Hypertension    Vitamin D  deficiency     SURGICAL HISTORY: Past Surgical History:  Procedure Laterality Date   CARDIOVERSION N/A 03/05/2024   Procedure: CARDIOVERSION;  Surgeon: Mona Vinie BROCKS, MD;  Location: [facility] INVASIVE CV LAB;  Service: Cardiovascular;  Laterality: N/A;   CARDIOVERSION N/A 03/24/2024   Procedure: CARDIOVERSION;  Surgeon: Rolan Ezra RAMAN, MD;  Location: [facility] INVASIVE CV LAB;  Service: Cardiovascular;  Laterality: N/A;   CARDIOVERSION N/A 04/24/2024   Procedure: CARDIOVERSION;  Surgeon: Okey Vina GAILS, MD;  Location: [facility] INVASIVE CV LAB;  Service: Cardiovascular;  Laterality: N/A;   CHEST TUBE INSERTION Right 03/18/2024   Procedure: CHEST TUBE INSERTION;  Surgeon: Annella Donnice SAUNDERS, MD;  Location: [facility] ENDOSCOPY;  Service: Pulmonary;  Laterality: Right;   CHOLECYSTECTOMY     colonscopy  2017   IR THORACENTESIS ASP PLEURAL SPACE W/IMG GUIDE  03/03/2024   JOINT REPLACEMENT Right    hip    RIGHT HEART CATH N/A 03/23/2024   Procedure: RIGHT HEART CATH;  Surgeon: Rolan Ezra RAMAN, MD;  Location: [facility] INVASIVE CV LAB;  Service: Cardiovascular;  Laterality: N/A;   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   Procedure: LEFT TOTAL HIP ARTHROPLASTY ANTERIOR APPROACH;  Surgeon: Dempsey Moan, MD;  Location: [facility] ORS;  Service: Orthopedics;  Laterality: Left;   VENTRAL HERNIA REPAIR N/A 07/12/2023   Procedure: HERNIA REPAIR VENTRAL;  Surgeon: Stechschulte, Deward PARAS, MD;  Location: [facility] OR;  Service: General;  Laterality: N/A;    SOCIAL HISTORY: Social History   Socioeconomic History   Marital status: Married    Spouse name: Not on file   Number of children: Not on file   Years of education: Not on file   Highest education level: Not on file  Occupational History   Not on file  Tobacco Use   Smoking status: Former    Current packs/day: 0.00    Average packs/day: 1.5  packs/day for 10.0 years (15.0 ttl pk-yrs)    Types: Cigarettes    Start date: 46    Quit date: 62    Years since quitting: 55.7   Smokeless tobacco: Never  Substance and Sexual Activity   Alcohol use: Not Currently    Comment: 1-2 glass wine per day   Drug use: No   Sexual activity: Not on file  Other Topics Concern   Not on file  Social History Narrative   Not on file   Social Drivers of Health   Financial Resource Strain: Not on file  Food Insecurity: Patient Declined (03/17/2024)   Hunger Vital Sign    Worried About Running Out of Food in the Last Year: Patient declined    Ran Out of Food in the Last Year: Patient declined  Transportation Needs: No Transportation Needs (02/04/2024)   Received from [facility]   PRAPARE - Transportation    Lack of Transportation (Medical): No    Lack of Transportation (Non-Medical): No  Physical Activity: Not on file  Stress: Not on file  Social Connections: Not on file  Intimate Partner Violence: Not At Risk (11/30/2022)   Humiliation, Afraid, Rape, and Kick questionnaire    Fear of Current or Ex-Partner: No    Emotionally Abused: No    Physically Abused: No    Sexually Abused: No    FAMILY HISTORY: Family History  Problem Relation Age of Onset   Cancer - Ovarian Sister    Lymphoma Sister     ALLERGIES:  has no known allergies.  MEDICATIONS:  Current Outpatient Medications  Medication Sig Dispense Refill   acetaminophen  (TYLENOL ) 325 MG tablet Take 650 mg by mouth daily as needed for moderate pain (pain score 4-6) or mild pain (pain score 1-3).     albuterol  (VENTOLIN  HFA) 108 (90 Base) MCG/ACT inhaler Inhale 2 puffs into the lungs every 4 (four) hours as needed for shortness of breath.     amiodarone  (PACERONE ) 200 MG tablet Take 1 tablet (200 mg total) by mouth daily.     cholecalciferol  (VITAMIN D ) 1000 units tablet Take 1,000 Units by mouth daily.     dapagliflozin  propanediol (FARXIGA ) 10 MG TABS tablet Take 1  tablet (10 mg total) by mouth daily before breakfast. 30 tablet 11   docusate sodium  (COLACE) 100 MG capsule Take 1 capsule (100 mg total) by mouth 2 (two) times daily as needed for mild constipation.     dorzolamide -timolol  (COSOPT ) 22.3-6.8 MG/ML ophthalmic solution Place 1 drop into both eyes 2 (two) times daily.     ELIQUIS  5 MG TABS tablet Take 5 mg by mouth 2 (two) times daily.     feeding supplement (BOOST HIGH PROTEIN) LIQD Take 1 Container by mouth daily.     feeding supplement (ENSURE PLUS HIGH PROTEIN) LIQD Take 237 mLs by mouth 3 (three) times daily between meals.     finasteride  (PROSCAR ) 5 MG tablet Take 5 mg by mouth daily.     furosemide  (LASIX ) 20 MG tablet Take 1 tablet (20 mg total) by mouth daily. 90 tablet 3   levothyroxine  (SYNTHROID ) 75 MCG tablet Take 1 tablet (75 mcg total) by mouth daily at 6 (six) AM. 90 tablet 3   mirtazapine  (REMERON ) 7.5 MG tablet Take 1 tablet (7.5 mg total) by mouth at bedtime. 30 tablet 0   Multiple Vitamin (MULTIVITAMIN WITH MINERALS) TABS tablet Take 1 tablet by mouth daily. 30 tablet 0   Nutritional Supplements (,FEEDING SUPPLEMENT, PROSOURCE PLUS) liquid Take 30 mLs by mouth 2 (two) times daily before lunch and supper. 887 mL 0   polyethylene glycol (MIRALAX  / GLYCOLAX ) 17 g packet Take 17 g by mouth 2 (two) times daily as needed.     predniSONE  (DELTASONE ) 10 MG tablet Take 4 tablets (40 mg total) by mouth daily with breakfast for 7 days, THEN 3 tablets (30 mg total) daily with breakfast for 7 days, THEN 2 tablets (20 mg total) daily with breakfast for 7 days, THEN 1 tablet (10 mg total) daily with breakfast for 7 days. 70 tablet 0   sildenafil (VIAGRA) 25 MG tablet Take 25 mg by mouth as needed for erectile dysfunction.     No current facility-administered medications for this visit.     PHYSICAL EXAMINATION: ECOG PERFORMANCE STATUS: 0 - Asymptomatic  He is not in a wheel chair today He has oxygen  and appears well today. CTA  bilaterally RRR BLE improving significantly.  LABORATORY DATA:  I have reviewed the data as listed Lab Results  Component Value Date   WBC 4.2 06/02/2024   HGB 12.4 (L) 06/02/2024   HCT 40.9 06/02/2024   MCV 95.1 06/02/2024   PLT 152 06/02/2024     Chemistry      Component Value Date/Time   NA 138 06/02/2024 1600   NA 139 04/13/2024 1245   K 4.1 06/02/2024 1600   CL 100 06/02/2024  1600   CO2 30 06/02/2024 1600   BUN 27 (H) 06/02/2024 1600   BUN 27 04/13/2024 1245   CREATININE 0.94 06/02/2024 1600   CREATININE 1.02 05/12/2024 1253      Component Value Date/Time   CALCIUM 9.5 06/02/2024 1600   ALKPHOS 146 (H) 05/12/2024 1253   AST 25 05/12/2024 1253   ALT 19 05/12/2024 1253   BILITOT 0.7 05/12/2024 1253       RADIOGRAPHIC STUDIES: I have personally reviewed the radiological images as listed and agreed with the findings in the report. DG Chest Portable 1 View Result Date: 06/02/2024 CLINICAL DATA:  Shortness of breath EXAM: PORTABLE CHEST 1 VIEW COMPARISON:  Chest radiograph April 09, 2024 FINDINGS: Persistent blunting of right costophrenic angle and pleural effusion with subjacent atelectasis of right lower lobe. Diffuse interstitial reticulonodular opacities throughout both lungs, increased to prior. No pleural effusion on the left. No significant pneumothorax. The cardiomediastinal silhouette is mildly enlarged and obscured by overlying pulmonary opacities. Postsurgical changes in right upper quadrant and right axillary. IMPRESSION: Increased interstitial opacities with persistent right-sided small pleural effusion and atelectatic/fibrotic changes of right costophrenic angle. Electronically Signed   By: Megan  Zare M.D.   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 Need for bone marrow biopsy. EXAM: CT GUIDED BONE MARROW ASPIRATION AND BIOPSY ANESTHESIA/SEDATION: Moderate  (conscious) sedation was employed during this procedure. A total of Versed  2.0 mg and Fentanyl  50 mcg was administered intravenously. Moderate Sedation Time: 12 minutes. The patient's level of consciousness and vital signs were monitored continuously by radiology nursing throughout the procedure under my direct supervision. PROCEDURE: The procedure risks, benefits, and alternatives were explained to the patient. Questions regarding the procedure were encouraged and answered. The patient understands and consents to the procedure. A time out was performed prior to initiating the procedure. The right gluteal region was prepped with chlorhexidine . Sterile gown and sterile gloves were used for the procedure. Local anesthesia was provided with 1% Lidocaine . Under CT guidance, an 11 gauge On Control bone cutting needle was advanced from a posterior approach into the right iliac bone. Needle positioning was confirmed with CT. Initial non heparinized and heparinized aspirate samples were obtained of bone marrow. Core biopsy was performed via the On Control drill needle. COMPLICATIONS: None FINDINGS: Inspection of initial aspirate did reveal visible particles. Intact core biopsy sample was obtained. IMPRESSION: CT guided bone marrow biopsy of right posterior iliac bone with both aspirate and core samples obtained. Electronically Signed   By: Marcey Moan M.D.   On: 05/29/2024 11:41   ECHOCARDIOGRAM COMPLETE Result Date: 05/28/2024    ECHOCARDIOGRAM REPORT   Patient Name:   Samuel Porter Date of Exam: 05/28/2024 Medical Rec #:  [id number]       Height:       73.0 in Accession #:    [id number]      Weight:       155.0 lb Date of Birth:  07-23-1941        BSA:          1.931 m Patient Age:    83 years        BP:           134/60 mmHg Patient Gender: M               HR:           47 bpm. Exam  Location:  [address] Procedure: 2D Echo, Cardiac Doppler and Color Doppler (Both Spectral and Color            Flow Doppler were  utilized during procedure). Indications:    I48.91 Atrial Fibrillation  History:        Patient has prior history of Echocardiogram examinations, most                 recent 03/20/2024. CHF, Arrythmias:Atrial Fibrillation,                 Signs/Symptoms:Fatigue; Risk Factors:Hypertension and Former                 Smoker. A-Fib (new onset- Feb 2025) with Cardioversion, Pleural                 Effusion with Acute Respiratory Failure s/p Thoracentesis, Heart                 Failure with Reduced EF.  Sonographer:    Heather Hawks RDCS Referring Phys: [employer] IMPRESSIONS  1. Left ventricular ejection fraction, by estimation, is 60 to 65%. The left ventricle has normal function. The left ventricle has no regional wall motion abnormalities. Left ventricular diastolic parameters are consistent with Grade II diastolic dysfunction (pseudonormalization).  2. Right ventricular systolic function is moderately reduced. The right ventricular size is moderately enlarged. There is moderately elevated pulmonary artery systolic pressure. The estimated right ventricular systolic pressure is 59.1 mmHg.  3. Left atrial size was severely dilated.  4. Right atrial size was severely dilated.  5. The mitral valve is normal in structure. No evidence of mitral valve regurgitation. No evidence of mitral stenosis.  6. Tricuspid valve regurgitation is mild to moderate. Severe tricuspid stenosis.  7. The aortic valve is tricuspid. There is mild calcification of the aortic valve. Aortic valve regurgitation is not visualized. Aortic valve sclerosis/calcification is present, without any evidence of aortic stenosis.  8. Aortic dilatation noted. There is borderline dilatation of the ascending aorta, measuring 38 mm.  9. The inferior vena cava is dilated in size with <50% respiratory variability, suggesting right atrial pressure of 15 mmHg. FINDINGS  Left Ventricle: Left ventricular ejection fraction, by estimation, is 60 to 65%. The left  ventricle has normal function. The left ventricle has no regional wall motion abnormalities. The left ventricular internal cavity size was normal in size. There is  no left ventricular hypertrophy. Left ventricular diastolic parameters are consistent with Grade II diastolic dysfunction (pseudonormalization). Right Ventricle: The right ventricular size is moderately enlarged. No increase in right ventricular wall thickness. Right ventricular systolic function is moderately reduced. There is moderately elevated pulmonary artery systolic pressure. The tricuspid  regurgitant velocity is 3.32 m/s, and with an assumed right atrial pressure of 15 mmHg, the estimated right ventricular systolic pressure is 59.1 mmHg. Left Atrium: Left atrial size was severely dilated. Right Atrium: Right atrial size was severely dilated. Pericardium: There is no evidence of pericardial effusion. Mitral Valve: The mitral valve is normal in structure. Mild mitral annular calcification. No evidence of mitral valve regurgitation. No evidence of mitral valve stenosis. Tricuspid Valve: The tricuspid valve is normal in structure. Tricuspid valve regurgitation is mild to moderate. Severe tricuspid stenosis. Aortic Valve: The aortic valve is tricuspid. There is mild calcification of the aortic valve. Aortic valve regurgitation is not visualized. Aortic valve sclerosis/calcification is present, without any evidence of aortic stenosis. Pulmonic Valve: The pulmonic valve was normal in structure. Pulmonic valve regurgitation  is trivial. No evidence of pulmonic stenosis. Aorta: Aortic dilatation noted. There is borderline dilatation of the ascending aorta, measuring 38 mm. Venous: The inferior vena cava is dilated in size with less than 50% respiratory variability, suggesting right atrial pressure of 15 mmHg. IAS/Shunts: No atrial level shunt detected by color flow Doppler.  LEFT VENTRICLE PLAX 2D LVIDd:         5.10 cm   Diastology LVIDs:         3.50 cm    LV e' medial:    5.44 cm/s LV PW:         1.00 cm   LV E/e' medial:  15.1 LV IVS:        0.90 cm   LV e' lateral:   7.02 cm/s LVOT diam:     2.15 cm   LV E/e' lateral: 11.7 LV SV:         77 LV SV Index:   40 LVOT Area:     3.63 cm  RIGHT VENTRICLE RV Basal diam:  5.10 cm RV Mid diam:    4.30 cm RV S prime:     13.20 cm/s TAPSE (M-mode): 1.8 cm RVSP:           59.1 mmHg LEFT ATRIUM              Index        RIGHT ATRIUM            Index LA diam:        4.60 cm  2.38 cm/m   RA Pressure: 15.00 mmHg LA Vol (A2C):   103.0 ml 53.35 ml/m  RA Area:     33.40 cm LA Vol (A4C):   86.2 ml  44.65 ml/m  RA Volume:   136.00 ml  70.44 ml/m LA Biplane Vol: 95.0 ml  49.20 ml/m  AORTIC VALVE LVOT Vmax:   105.00 cm/s LVOT Vmean:  63.400 cm/s LVOT VTI:    0.213 m  AORTA Ao Root diam: 3.70 cm Ao Asc diam:  3.80 cm MITRAL VALVE               TRICUSPID VALVE MV Area (PHT): cm         TR Peak grad:   44.1 mmHg MV Decel Time: 247 msec    TR Vmax:        332.00 cm/s MV E velocity: 82.30 cm/s  Estimated RAP:  15.00 mmHg MV A velocity: 34.35 cm/s  RVSP:           59.1 mmHg MV E/A ratio:  2.40                            SHUNTS                            Systemic VTI:  0.21 m                            Systemic Diam: 2.15 cm Toribio Fuel MD Electronically signed by Toribio Fuel MD Signature Date/Time: 05/28/2024/9:23:17 PM    Final       Amber Stalls, MD 06/18/2024 2:31 PM

## 2024-06-19 ENCOUNTER — Telehealth (HOSPITAL_COMMUNITY): Payer: Self-pay

## 2024-06-19 ENCOUNTER — Ambulatory Visit: Admitting: Hematology and Oncology

## 2024-06-19 NOTE — Telephone Encounter (Signed)
 Confirmed pt's appointment for Monday with Pulm Rehab

## 2024-06-22 ENCOUNTER — Encounter (HOSPITAL_COMMUNITY)
Admission: RE | Admit: 2024-06-22 | Discharge: 2024-06-22 | Disposition: A | Discharge status: Home / Self Care | Source: Ambulatory Visit | Attending: Pulmonary Disease | Admitting: Pulmonary Disease

## 2024-06-22 ENCOUNTER — Encounter (HOSPITAL_COMMUNITY): Payer: Self-pay

## 2024-06-22 VITALS — BP 132/68 | HR 50 | Ht 73.0 in | Wt 154.5 lb

## 2024-06-22 DIAGNOSIS — J849 Interstitial pulmonary disease, unspecified: Secondary | ICD-10-CM | POA: Insufficient documentation

## 2024-06-22 NOTE — Progress Notes (Signed)
 Samuel Porter 83 y.o. male Pulmonary Rehab Orientation Note This patient who was referred to Pulmonary Rehab by Dr. Annella with the diagnosis of ILD arrived today in Cardiac and Pulmonary Rehab. He arrived ambulatory with assistive device with normal gait. He does carry portable oxygen . [employer] is the provider for their DME. Per patient, Samuel Porter uses oxygen  continuously. Color good, skin warm and dry. Patient is oriented to time and place. Patient's medical history, psychosocial health, and medications reviewed. Psychosocial assessment reveals patient lives with spouse. Samuel Porter is currently retired. Patient hobbies include gardening and spending time with family. Patient reports his stress level is low. Areas of stress/anxiety include family . Patient does not exhibit signs of depression. PHQ2/9 score 0/1. Samuel Porter shows good  coping skills with positive outlook on life. Offered emotional support and reassurance. Will continue to monitor. Physical assessment performed by Ronal Levin RN. Please see their orientation physical assessment note. Samuel Porter reports he  does take medications as prescribed. Patient states he  follows a regular  diet. The patient reports no specific efforts to gain or lose weight.. Patient's weight will be monitored closely. Demonstration and practice of PLB using pulse oximeter. Samuel Porter able to return demonstration satisfactorily. Safety and hand hygiene in the exercise area reviewed with patient. Samuel Porter voices understanding of the information reviewed. Department expectations discussed with patient and achievable goals were set. The patient shows enthusiasm about attending the program and we look forward to working with Samuel. Samuel Porter completed a 6 min walk test today and is scheduled to begin exercise on 06/30/24 at 10:15 am.  [id number] Samuel Porter Boaz Berisha, MS, ACSM-CEP

## 2024-06-22 NOTE — Progress Notes (Signed)
 Pulmonary Individual Treatment Plan  Patient Details  Name: Samuel Porter MRN: [id number] Date of Birth: Apr 24, 1941 Referring Provider:   [name] Pulmonary Rehab Walk Test from 06/22/2024 in [facility]  Referring Provider [name]    Initial Encounter Date:  Flowsheet Row Pulmonary Rehab Walk Test from 06/22/2024 in [facility]  Date 06/22/24    Visit Diagnosis: ILD (interstitial lung disease) (HCC)  Patient's Home Medications on Admission:   Current Outpatient Medications:    acetaminophen  (TYLENOL ) 325 MG tablet, Take 650 mg by mouth daily as needed for moderate pain (pain score 4-6) or mild pain (pain score 1-3)., Disp: , Rfl:    albuterol  (VENTOLIN  HFA) 108 (90 Base) MCG/ACT inhaler, Inhale 2 puffs into the lungs every 4 (four) hours as needed for shortness of breath., Disp: , Rfl:    amiodarone  (PACERONE ) 200 MG tablet, Take 1 tablet (200 mg total) by mouth daily., Disp: , Rfl:    cholecalciferol  (VITAMIN D ) 1000 units tablet, Take 1,000 Units by mouth daily., Disp: , Rfl:    dapagliflozin  propanediol (FARXIGA ) 10 MG TABS tablet, Take 1 tablet (10 mg total) by mouth daily before breakfast., Disp: 30 tablet, Rfl: 11   docusate sodium  (COLACE) 100 MG capsule, Take 1 capsule (100 mg total) by mouth 2 (two) times daily as needed for mild constipation., Disp: , Rfl:    dorzolamide -timolol  (COSOPT ) 22.3-6.8 MG/ML ophthalmic solution, Place 1 drop into both eyes 2 (two) times daily., Disp: , Rfl:    ELIQUIS  5 MG TABS tablet, Take 5 mg by mouth 2 (two) times daily., Disp: , Rfl:    feeding supplement (BOOST HIGH PROTEIN) LIQD, Take 1 Container by mouth daily., Disp: , Rfl:    feeding supplement (ENSURE PLUS HIGH PROTEIN) LIQD, Take 237 mLs by mouth 3 (three) times daily between meals., Disp: , Rfl:    finasteride  (PROSCAR ) 5 MG tablet, Take 5 mg by mouth daily., Disp: ,  Rfl:    furosemide  (LASIX ) 20 MG tablet, Take 1 tablet (20 mg total) by mouth daily., Disp: 90 tablet, Rfl: 3   levothyroxine  (SYNTHROID ) 75 MCG tablet, Take 1 tablet (75 mcg total) by mouth daily at 6 (six) AM., Disp: 90 tablet, Rfl: 3   mirtazapine  (REMERON ) 7.5 MG tablet, Take 1 tablet (7.5 mg total) by mouth at bedtime., Disp: 30 tablet, Rfl: 0   Multiple Vitamin (MULTIVITAMIN WITH MINERALS) TABS tablet, Take 1 tablet by mouth daily., Disp: 30 tablet, Rfl: 0   Nutritional Supplements (,FEEDING SUPPLEMENT, PROSOURCE PLUS) liquid, Take 30 mLs by mouth 2 (two) times daily before lunch and supper., Disp: 887 mL, Rfl: 0   polyethylene glycol (MIRALAX  / GLYCOLAX ) 17 g packet, Take 17 g by mouth 2 (two) times daily as needed., Disp: , Rfl:    predniSONE  (DELTASONE ) 10 MG tablet, Take 4 tablets (40 mg total) by mouth daily with breakfast for 7 days, THEN 3 tablets (30 mg total) daily with breakfast for 7 days, THEN 2 tablets (20 mg total) daily with breakfast for 7 days, THEN 1 tablet (10 mg total) daily with breakfast for 7 days., Disp: 70 tablet, Rfl: 0   sildenafil (VIAGRA) 25 MG tablet, Take 25 mg by mouth as needed for erectile dysfunction., Disp: , Rfl:   Past Medical History: Past Medical History:  Diagnosis Date   Arthritis    oa   BPH (benign prostatic hyperplasia)  Cancer (HCC) 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   Cataracts, bilateral    Glaucoma    Hypertension    Vitamin D  deficiency     Tobacco Use: Social History   Tobacco Use  Smoking Status Former   Current packs/day: 0.00   Average packs/day: 1.5 packs/day for 10.0 years (15.0 ttl pk-yrs)   Types: Cigarettes   Start date: 65   Quit date: 21   Years since quitting: 55.7  Smokeless Tobacco Never    Labs: Review Flowsheet       Latest Ref Rng & Units 07/11/2023 03/23/2024  Labs for ITP Cardiac and Pulmonary Rehab  Bicarbonate 20.0 - 28.0 mmol/L 38.1  39.9  39.7   TCO2 22 - 32 mmol/L 40  42  42   O2  Saturation % 54  69  67     Details       Multiple values from one day are sorted in reverse-chronological order         Capillary Blood Glucose: Lab Results  Component Value Date   GLUCAP 147 (H) 07/12/2023   GLUCAP 98 11/27/2022     Pulmonary Assessment Scores:  Pulmonary Assessment Scores     Row Name 06/22/24 0920         ADL UCSD   ADL Phase Entry     SOB Score total 32       CAT Score   CAT Score 2       mMRC Score   mMRC Score 2       UCSD: Self-administered rating of dyspnea associated with activities of daily living (ADLs) 6-point scale (0 = not at all to 5 = maximal or unable to do because of breathlessness)  Scoring Scores range from 0 to 120.  Minimally important difference is 5 units  CAT: CAT can identify the health impairment of COPD patients and is better correlated with disease progression.  CAT has a scoring range of zero to 40. The CAT score is classified into four groups of low (less than 10), medium (10 - 20), high (21-30) and very high (31-40) based on the impact level of disease on health status. A CAT score over 10 suggests significant symptoms.  A worsening CAT score could be explained by an exacerbation, poor medication adherence, poor inhaler technique, or progression of COPD or comorbid conditions.  CAT MCID is 2 points  mMRC: mMRC (Modified Medical Research Council) Dyspnea Scale is used to assess the degree of baseline functional disability in patients of respiratory disease due to dyspnea. No minimal important difference is established. A decrease in score of 1 point or greater is considered a positive change.   Pulmonary Function Assessment:  Pulmonary Function Assessment - 06/22/24 0920       Breath   Shortness of Breath Yes;Limiting activity          Exercise Target Goals: Exercise Program Goal: Individual exercise prescription set using results from initial 6 min walk test and THRR while considering  patient's  activity barriers and safety.   Exercise Prescription Goal: Initial exercise prescription builds to 30-45 minutes a day of aerobic activity, 2-3 days per week.  Home exercise guidelines will be given to patient during program as part of exercise prescription that the participant will acknowledge.  Activity Barriers & Risk Stratification:  Activity Barriers & Cardiac Risk Stratification - 06/22/24 0935       Activity Barriers & Cardiac Risk Stratification   Activity Barriers Deconditioning;Balance  Concerns;Muscular Weakness;Shortness of Breath;Assistive Device;Right Hip Replacement;Left Hip Replacement          6 Minute Walk:  6 Minute Walk     Row Name 06/22/24 1026         6 Minute Walk   Phase Initial     Distance 770 feet     Walk Time 6 minutes     # of Rest Breaks 0     MPH 1.46     METS 1.65     RPE 11     Perceived Dyspnea  1     VO2 Peak 5.76     Symptoms No     Resting HR 50 bpm     Resting BP 132/68     Resting Oxygen  Saturation  99 %     Exercise Oxygen  Saturation  during 6 min walk 97 %     Max Ex. HR 69 bpm     Max Ex. BP 150/60     2 Minute Post BP 128/66       Interval HR   1 Minute HR 64     2 Minute HR 65     3 Minute HR 66     4 Minute HR 69     5 Minute HR 69     6 Minute HR 69     2 Minute Post HR 66     Interval Heart Rate? Yes       Interval Oxygen    Interval Oxygen ? Yes     Baseline Oxygen  Saturation % 99 %     1 Minute Oxygen  Saturation % 100 %     1 Minute Liters of Oxygen  3 L     2 Minute Oxygen  Saturation % 99 %     2 Minute Liters of Oxygen  3 L     3 Minute Oxygen  Saturation % 98 %     3 Minute Liters of Oxygen  3 L     4 Minute Oxygen  Saturation % 99 %     4 Minute Liters of Oxygen  3 L     5 Minute Oxygen  Saturation % 97 %     5 Minute Liters of Oxygen  3 L     6 Minute Oxygen  Saturation % 97 %     6 Minute Liters of Oxygen  3 L     2 Minute Post Oxygen  Saturation % 97 %     2 Minute Post Liters of Oxygen  3 L         Oxygen  Initial Assessment:  Oxygen  Initial Assessment - 06/22/24 0919       Home Oxygen    Home Oxygen  Device Portable Concentrator;Home Concentrator    Sleep Oxygen  Prescription Continuous    Liters per minute 1    Home Exercise Oxygen  Prescription Pulsed    Liters per minute 3    Home Resting Oxygen  Prescription Pulsed    Liters per minute 2    Compliance with Home Oxygen  Use Yes      Initial 6 min Walk   Oxygen  Used Continuous    Liters per minute 3      Program Oxygen  Prescription   Program Oxygen  Prescription Continuous    Liters per minute 3      Intervention   Short Term Goals To learn and exhibit compliance with exercise, home and travel O2 prescription;To learn and understand importance of maintaining oxygen  saturations>88%;To learn and demonstrate proper use of respiratory medications;To learn and understand importance of monitoring  SPO2 with pulse oximeter and demonstrate accurate use of the pulse oximeter.;To learn and demonstrate proper pursed lip breathing techniques or other breathing techniques.     Long  Term Goals Exhibits compliance with exercise, home  and travel O2 prescription;Verbalizes importance of monitoring SPO2 with pulse oximeter and return demonstration;Maintenance of O2 saturations>88%;Exhibits proper breathing techniques, such as pursed lip breathing or other method taught during program session;Compliance with respiratory medication;Demonstrates proper use of MDI's          Oxygen  Re-Evaluation:   Oxygen  Discharge (Final Oxygen  Re-Evaluation):   Initial Exercise Prescription:  Initial Exercise Prescription - 06/22/24 1000       Date of Initial Exercise RX and Referring Provider   Date 06/22/24    Referring Provider [name]    Expected Discharge Date 09/17/24      Oxygen    Oxygen  Continuous    Liters 2    Maintain Oxygen  Saturation 88% or higher      NuStep   Level 1    SPM 58    Minutes 15    METs 1.43      Track    Minutes 15    METs 1.7      Prescription Details   Frequency (times per week) 2    Duration Progress to 30 minutes of continuous aerobic without signs/symptoms of physical distress      Intensity   THRR 40-80% of Max Heartrate 55-110    Ratings of Perceived Exertion 11-13    Perceived Dyspnea 0-4      Progression   Progression Continue to progress workloads to maintain intensity without signs/symptoms of physical distress.      Resistance Training   Training Prescription Yes    Weight red bands    Reps 10-15          Perform Capillary Blood Glucose checks as needed.  Exercise Prescription Changes:   Exercise Comments:   Exercise Goals and Review:   Exercise Goals     Row Name 06/22/24 0920             Exercise Goals   Increase Physical Activity Yes       Intervention Provide advice, education, support and counseling about physical activity/exercise needs.;Develop an individualized exercise prescription for aerobic and resistive training based on initial evaluation findings, risk stratification, comorbidities and participant's personal goals.       Expected Outcomes Short Term: Attend rehab on a regular basis to increase amount of physical activity.;Long Term: Add in home exercise to make exercise part of routine and to increase amount of physical activity.;Long Term: Exercising regularly at least 3-5 days a week.       Increase Strength and Stamina Yes       Intervention Provide advice, education, support and counseling about physical activity/exercise needs.;Develop an individualized exercise prescription for aerobic and resistive training based on initial evaluation findings, risk stratification, comorbidities and participant's personal goals.       Expected Outcomes Short Term: Increase workloads from initial exercise prescription for resistance, speed, and METs.;Short Term: Perform resistance training exercises routinely during rehab and add in resistance training  at home;Long Term: Improve cardiorespiratory fitness, muscular endurance and strength as measured by increased METs and functional capacity ( )       Able to understand and use rate of perceived exertion (RPE) scale Yes       Intervention Provide education and explanation on how to use RPE scale       Expected Outcomes  Short Term: Able to use RPE daily in rehab to express subjective intensity level;Long Term:  Able to use RPE to guide intensity level when exercising independently       Able to understand and use Dyspnea scale Yes       Intervention Provide education and explanation on how to use Dyspnea scale       Expected Outcomes Short Term: Able to use Dyspnea scale daily in rehab to express subjective sense of shortness of breath during exertion;Long Term: Able to use Dyspnea scale to guide intensity level when exercising independently       Knowledge and understanding of Target Heart Rate Range (THRR) Yes       Intervention Provide education and explanation of THRR including how the numbers were predicted and where they are located for reference       Expected Outcomes Short Term: Able to state/look up THRR;Long Term: Able to use THRR to govern intensity when exercising independently;Short Term: Able to use daily as guideline for intensity in rehab       Understanding of Exercise Prescription Yes       Intervention Provide education, explanation, and written materials on patient's individual exercise prescription       Expected Outcomes Short Term: Able to explain program exercise prescription;Long Term: Able to explain home exercise prescription to exercise independently          Exercise Goals Re-Evaluation :   Discharge Exercise Prescription (Final Exercise Prescription Changes):   Nutrition:  Target Goals: Understanding of nutrition guidelines, daily intake of sodium 1500mg , cholesterol 200mg , calories 30% from fat and 7% or less from saturated fats, daily to have 5 or more  servings of fruits and vegetables.  Biometrics:    Nutrition Therapy Plan and Nutrition Goals:   Nutrition Assessments:  MEDIFICTS Score Key: >=70 Need to make dietary changes  40-70 Heart Healthy Diet <= 40 Therapeutic Level Cholesterol Diet   Picture Your Plate Scores: <59 Unhealthy dietary pattern with much room for improvement. 41-50 Dietary pattern unlikely to meet recommendations for good health and room for improvement. 51-60 More healthful dietary pattern, with some room for improvement.  >60 Healthy dietary pattern, although there may be some specific behaviors that could be improved.    Nutrition Goals Re-Evaluation:   Nutrition Goals Discharge (Final Nutrition Goals Re-Evaluation):   Psychosocial: Target Goals: Acknowledge presence or absence of significant depression and/or stress, maximize coping skills, provide positive support system. Participant is able to verbalize types and ability to use techniques and skills needed for reducing stress and depression.  Initial Review & Psychosocial Screening:  Initial Psych Review & Screening - 06/22/24 0931       Initial Review   Current issues with None Identified      Family Dynamics   Good Support System? Yes    Comments 2 daughters      Barriers   Psychosocial barriers to participate in program There are no identifiable barriers or psychosocial needs.      Screening Interventions   Interventions Encouraged to exercise          Quality of Life Scores:  Scores of 19 and below usually indicate a poorer quality of life in these areas.  A difference of  2-3 points is a clinically meaningful difference.  A difference of 2-3 points in the total score of the Quality of Life Index has been associated with significant improvement in overall quality of life, self-image, physical symptoms, and general health in  studies assessing change in quality of life.  PHQ-9: Review Flowsheet       06/22/2024 11/30/2022   Depression screen PHQ 2/9  Decreased Interest 0 0  Down, Depressed, Hopeless 0 0  PHQ - 2 Score 0 0  Altered sleeping 0 -  Tired, decreased energy 1 -  Change in appetite 0 -  Feeling bad or failure about yourself  0 -  Trouble concentrating 0 -  Moving slowly or fidgety/restless 0 -  Suicidal thoughts 0 -  PHQ-9 Score 1 -  Difficult doing work/chores Somewhat difficult -   Interpretation of Total Score  Total Score Depression Severity:  1-4 = Minimal depression, 5-9 = Mild depression, 10-14 = Moderate depression, 15-19 = Moderately severe depression, 20-27 = Severe depression   Psychosocial Evaluation and Intervention:  Psychosocial Evaluation - 06/22/24 0932       Psychosocial Evaluation & Interventions   Interventions Encouraged to exercise with the program and follow exercise prescription    Comments Daman denies any psychosocial barriers at this time.    Expected Outcomes For [place] to participate in rehab without psychosocial barriers.    Continue Psychosocial Services  No Follow up required          Psychosocial Re-Evaluation:   Psychosocial Discharge (Final Psychosocial Re-Evaluation):   Education: Education Goals: Education classes will be provided on a weekly basis, covering required topics. Participant will state understanding/return demonstration of topics presented.  Learning Barriers/Preferences:  Learning Barriers/Preferences - 06/22/24 0933       Learning Barriers/Preferences   Learning Barriers None    Learning Preferences None          Education Topics: Know Your Numbers Group instruction that is supported by a PowerPoint presentation. Instructor discusses importance of knowing and understanding resting, exercise, and post-exercise oxygen  saturation, heart rate, and blood pressure. Oxygen  saturation, heart rate, blood pressure, rating of perceived exertion, and dyspnea are reviewed along with a normal range for these values.    Exercise for  the Pulmonary Patient Group instruction that is supported by a PowerPoint presentation. Instructor discusses benefits of exercise, core components of exercise, frequency, duration, and intensity of an exercise routine, importance of utilizing pulse oximetry during exercise, safety while exercising, and options of places to exercise outside of rehab.    MET Level  Group instruction provided by PowerPoint, verbal discussion, and written material to support subject matter. Instructor reviews what METs are and how to increase METs.    Pulmonary Medications Verbally interactive group education provided by instructor with focus on inhaled medications and proper administration.   Anatomy and Physiology of the Respiratory System Group instruction provided by PowerPoint, verbal discussion, and written material to support subject matter. Instructor reviews respiratory cycle and anatomical components of the respiratory system and their functions. Instructor also reviews differences in obstructive and restrictive respiratory diseases with examples of each.    Oxygen  Safety Group instruction provided by PowerPoint, verbal discussion, and written material to support subject matter. There is an overview of "What is Oxygen " and "Why do we need it".  Instructor also reviews how to create a safe environment for oxygen  use, the importance of using oxygen  as prescribed, and the risks of noncompliance. There is a brief discussion on traveling with oxygen  and resources the patient may utilize.   Oxygen  Use Group instruction provided by PowerPoint, verbal discussion, and written material to discuss how supplemental oxygen  is prescribed and different types of oxygen  supply systems. Resources for more information are provided.  Breathing Techniques Group instruction that is supported by demonstration and informational handouts. Instructor discusses the benefits of pursed lip and diaphragmatic breathing and  detailed demonstration on how to perform both.     Risk Factor Reduction Group instruction that is supported by a PowerPoint presentation. Instructor discusses the definition of a risk factor, different risk factors for pulmonary disease, and how the heart and lungs work together.   Pulmonary Diseases Group instruction provided by PowerPoint, verbal discussion, and written material to support subject matter. Instructor gives an overview of the different type of pulmonary diseases. There is also a discussion on risk factors and symptoms as well as ways to manage the diseases.   Stress and Energy Conservation Group instruction provided by PowerPoint, verbal discussion, and written material to support subject matter. Instructor gives an overview of stress and the impact it can have on the body. Instructor also reviews ways to reduce stress. There is also a discussion on energy conservation and ways to conserve energy throughout the day.   Warning Signs and Symptoms Group instruction provided by PowerPoint, verbal discussion, and written material to support subject matter. Instructor reviews warning signs and symptoms of stroke, heart attack, cold and flu. Instructor also reviews ways to prevent the spread of infection.   Other Education Group or individual verbal, written, or video instructions that support the educational goals of the pulmonary rehab program.    Knowledge Questionnaire Score:  Knowledge Questionnaire Score - 06/22/24 0923       Knowledge Questionnaire Score   Pre Score 12/18          Core Components/Risk Factors/Patient Goals at Admission:  Personal Goals and Risk Factors at Admission - 06/22/24 0924       Core Components/Risk Factors/Patient Goals on Admission   Improve shortness of breath with ADL's Yes    Intervention Provide education, individualized exercise plan and daily activity instruction to help decrease symptoms of SOB with activities of daily  living.    Expected Outcomes Short Term: Improve cardiorespiratory fitness to achieve a reduction of symptoms when performing ADLs;Long Term: Be able to perform more ADLs without symptoms or delay the onset of symptoms    Increase knowledge of respiratory medications and ability to use respiratory devices properly  Yes    Intervention Provide education and demonstration as needed of appropriate use of medications, inhalers, and oxygen  therapy.    Expected Outcomes Short Term: Achieves understanding of medications use. Understands that oxygen  is a medication prescribed by physician. Demonstrates appropriate use of inhaler and oxygen  therapy.;Long Term: Maintain appropriate use of medications, inhalers, and oxygen  therapy.          Core Components/Risk Factors/Patient Goals Review:    Core Components/Risk Factors/Patient Goals at Discharge (Final Review):    ITP Comments: Dr. Slater Staff is Medical Director for Pulmonary Rehab at [facility].

## 2024-06-22 NOTE — Progress Notes (Signed)
 Pulmonary Rehab Orientation Assessment Note  Patient Details  Name: Samuel Porter MRN: [id number] Date of Birth: July 02, 1941 Referring Provider:  [name]    Frail appearing, A&Ox4, NAD Eyes/Ears: states he is hard of hearing  Lungs: Clear Left lobes, diminished right LL with no wheezes, rales, rhonchi, denies chronic cough, dyspnea on exertion, wearing 2L [place] at rest and 3L with exertion Heart: Regular rate rhythm, no murmurs, no rubs, no clicks Gastrointestinal: abdomin soft, + bowel sounds in all 4 quads, denies recent weight gain or loss, endorses normal BMs Genitourinary: WNL, pt denies s/s Extremities:  +2 pulses, grip strength equal, strong, no edema, no cyanosis, no clubbing Integumentary: multiple scabs, bruises to arms, healing wounds noted Psy/Soc: Pt denies PHQ 2 & 9 scores were 0 & 1. Denies any barriers to rehab Assistive devices: Pt uses can, carriers portable oxygen  concentrator (POC), has home concentrator

## 2024-06-24 ENCOUNTER — Ambulatory Visit: Admitting: Pulmonary Disease

## 2024-06-24 ENCOUNTER — Encounter: Payer: Self-pay | Admitting: Pulmonary Disease

## 2024-06-24 VITALS — BP 124/78 | HR 91 | Ht 73.0 in | Wt 151.0 lb

## 2024-06-24 DIAGNOSIS — J849 Interstitial pulmonary disease, unspecified: Secondary | ICD-10-CM

## 2024-06-24 DIAGNOSIS — J9610 Chronic respiratory failure, unspecified whether with hypoxia or hypercapnia: Secondary | ICD-10-CM | POA: Diagnosis not present

## 2024-06-24 DIAGNOSIS — Z87891 Personal history of nicotine dependence: Secondary | ICD-10-CM | POA: Diagnosis not present

## 2024-06-24 NOTE — Progress Notes (Signed)
 @Patient  ID: Samuel Porter, male    DOB: 06/27/1941, 83 y.o.   MRN: [id number]  Chief Complaint  Patient presents with   Medical Management of Chronic Issues    Referring provider: Kip Righter, MD  HPI:   83 y.o. 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  At last visit, increase Lasix  for 3 days.  No real improvement.  CRP within normal notes, sed rate slightly elevated.  Prednisone  taper sent and given no improvement with Lasix  and mild elevation in sed rate.  He is have improvement in his shortness of breath etc.  Part of this to his has been monitor his oxygen  more closely and keep it above 90.  Increased his oxygen  flow rate to 3 L from 2 L and maintained above 90 with exertion.  Questionaires / Pulmonary Flowsheets:   ACT:      No data to display          MMRC:     No data to display          Epworth:      No data to display          Tests:   FENO:  No results found for: NITRICOXIDE  PFT:     No data to display          WALK:     06/22/2024   10:26 AM  SIX MIN WALK  2 Minute Oxygen  Saturation % 99 %  2 Minute HR 65  4 Minute Oxygen  Saturation % 99 %  4 Minute HR 69  6 Minute Oxygen  Saturation % 97 %  6 Minute HR 69    Imaging: Personally reviewed DG Chest Portable 1 View Result Date: 06/02/2024 CLINICAL DATA:  Shortness of breath EXAM: PORTABLE CHEST 1 VIEW COMPARISON:  Chest radiograph April 09, 2024 FINDINGS: Persistent blunting of right costophrenic angle and pleural effusion with subjacent atelectasis of right lower lobe. Diffuse interstitial reticulonodular opacities throughout both lungs, increased to prior. No pleural effusion on the left. No significant pneumothorax. The cardiomediastinal silhouette is mildly enlarged  and obscured by overlying pulmonary opacities. Postsurgical changes in right upper quadrant and right axillary. IMPRESSION: Increased interstitial opacities with persistent right-sided small pleural effusion and atelectatic/fibrotic changes of right costophrenic angle. Electronically Signed   By: Megan  Zare M.D.   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 Need for bone marrow biopsy. EXAM: CT GUIDED BONE MARROW ASPIRATION AND BIOPSY ANESTHESIA/SEDATION: Moderate (conscious) sedation was employed during this procedure. A total of Versed  2.0 mg and Fentanyl  50 mcg was administered intravenously. Moderate Sedation Time: 12 minutes. The patient's level of consciousness and vital signs were monitored continuously by radiology nursing throughout the procedure under my direct supervision. PROCEDURE: The procedure risks, benefits, and alternatives were explained to the patient. Questions regarding the procedure were encouraged and answered. The patient understands and consents to the procedure. A time out was performed prior to initiating the procedure. The right gluteal region was prepped with chlorhexidine . Sterile gown and sterile gloves were used for the procedure. Local anesthesia was provided with 1% Lidocaine . Under CT guidance, an 11 gauge On Control bone cutting needle was advanced from a posterior approach into the right iliac bone. Needle  positioning was confirmed with CT. Initial non heparinized and heparinized aspirate samples were obtained of bone marrow. Core biopsy was performed via the On Control drill needle. COMPLICATIONS: None FINDINGS: Inspection of initial aspirate did reveal visible particles. Intact core biopsy sample was obtained. IMPRESSION: CT guided bone marrow biopsy of right posterior iliac bone with both aspirate and core samples obtained. Electronically Signed   By: Marcey Moan M.D.    On: 05/29/2024 11:41   ECHOCARDIOGRAM COMPLETE Result Date: 05/28/2024    ECHOCARDIOGRAM REPORT   Patient Name:   Samuel Porter Date of Exam: 05/28/2024 Medical Rec #:  [id number]       Height:       73.0 in Accession #:    [id number]      Weight:       155.0 lb Date of Birth:  August 30, 1941        BSA:          1.931 m Patient Age:    83 years        BP:           134/60 mmHg Patient Gender: M               HR:           47 bpm. Exam Location:  [address] Procedure: 2D Echo, Cardiac Doppler and Color Doppler (Both Spectral and Color            Flow Doppler were utilized during procedure). Indications:    I48.91 Atrial Fibrillation  History:        Patient has prior history of Echocardiogram examinations, most                 recent 03/20/2024. CHF, Arrythmias:Atrial Fibrillation,                 Signs/Symptoms:Fatigue; Risk Factors:Hypertension and Former                 Smoker. A-Fib (new onset- Feb 2025) with Cardioversion, Pleural                 Effusion with Acute Respiratory Failure s/p Thoracentesis, Heart                 Failure with Reduced EF.  Sonographer:    Heather Hawks RDCS Referring Phys: [employer] IMPRESSIONS  1. Left ventricular ejection fraction, by estimation, is 60 to 65%. The left ventricle has normal function. The left ventricle has no regional wall motion abnormalities. Left ventricular diastolic parameters are consistent with Grade II diastolic dysfunction (pseudonormalization).  2. Right ventricular systolic function is moderately reduced. The right ventricular size is moderately enlarged. There is moderately elevated pulmonary artery systolic pressure. The estimated right ventricular systolic pressure is 59.1 mmHg.  3. Left atrial size was severely dilated.  4. Right atrial size was severely dilated.  5. The mitral valve is normal in structure. No evidence of mitral valve regurgitation. No evidence of mitral stenosis.  6. Tricuspid valve regurgitation is mild to moderate. Severe  tricuspid stenosis.  7. The aortic valve is tricuspid. There is mild calcification of the aortic valve. Aortic valve regurgitation is not visualized. Aortic valve sclerosis/calcification is present, without any evidence of aortic stenosis.  8. Aortic dilatation noted. There is borderline dilatation of the ascending aorta, measuring 38 mm.  9. The inferior vena cava is dilated in size with <50% respiratory variability, suggesting right atrial pressure of 15 mmHg. FINDINGS  Left Ventricle: Left  ventricular ejection fraction, by estimation, is 60 to 65%. The left ventricle has normal function. The left ventricle has no regional wall motion abnormalities. The left ventricular internal cavity size was normal in size. There is  no left ventricular hypertrophy. Left ventricular diastolic parameters are consistent with Grade II diastolic dysfunction (pseudonormalization). Right Ventricle: The right ventricular size is moderately enlarged. No increase in right ventricular wall thickness. Right ventricular systolic function is moderately reduced. There is moderately elevated pulmonary artery systolic pressure. The tricuspid  regurgitant velocity is 3.32 m/s, and with an assumed right atrial pressure of 15 mmHg, the estimated right ventricular systolic pressure is 59.1 mmHg. Left Atrium: Left atrial size was severely dilated. Right Atrium: Right atrial size was severely dilated. Pericardium: There is no evidence of pericardial effusion. Mitral Valve: The mitral valve is normal in structure. Mild mitral annular calcification. No evidence of mitral valve regurgitation. No evidence of mitral valve stenosis. Tricuspid Valve: The tricuspid valve is normal in structure. Tricuspid valve regurgitation is mild to moderate. Severe tricuspid stenosis. Aortic Valve: The aortic valve is tricuspid. There is mild calcification of the aortic valve. Aortic valve regurgitation is not visualized. Aortic valve sclerosis/calcification is present,  without any evidence of aortic stenosis. Pulmonic Valve: The pulmonic valve was normal in structure. Pulmonic valve regurgitation is trivial. No evidence of pulmonic stenosis. Aorta: Aortic dilatation noted. There is borderline dilatation of the ascending aorta, measuring 38 mm. Venous: The inferior vena cava is dilated in size with less than 50% respiratory variability, suggesting right atrial pressure of 15 mmHg. IAS/Shunts: No atrial level shunt detected by color flow Doppler.  LEFT VENTRICLE PLAX 2D LVIDd:         5.10 cm   Diastology LVIDs:         3.50 cm   LV e' medial:    5.44 cm/s LV PW:         1.00 cm   LV E/e' medial:  15.1 LV IVS:        0.90 cm   LV e' lateral:   7.02 cm/s LVOT diam:     2.15 cm   LV E/e' lateral: 11.7 LV SV:         77 LV SV Index:   40 LVOT Area:     3.63 cm  RIGHT VENTRICLE RV Basal diam:  5.10 cm RV Mid diam:    4.30 cm RV S prime:     13.20 cm/s TAPSE (M-mode): 1.8 cm RVSP:           59.1 mmHg LEFT ATRIUM              Index        RIGHT ATRIUM            Index LA diam:        4.60 cm  2.38 cm/m   RA Pressure: 15.00 mmHg LA Vol (A2C):   103.0 ml 53.35 ml/m  RA Area:     33.40 cm LA Vol (A4C):   86.2 ml  44.65 ml/m  RA Volume:   136.00 ml  70.44 ml/m LA Biplane Vol: 95.0 ml  49.20 ml/m  AORTIC VALVE LVOT Vmax:   105.00 cm/s LVOT Vmean:  63.400 cm/s LVOT VTI:    0.213 m  AORTA Ao Root diam: 3.70 cm Ao Asc diam:  3.80 cm MITRAL VALVE               TRICUSPID VALVE MV Area (PHT): cm  TR Peak grad:   44.1 mmHg MV Decel Time: 247 msec    TR Vmax:        332.00 cm/s MV E velocity: 82.30 cm/s  Estimated RAP:  15.00 mmHg MV A velocity: 34.35 cm/s  RVSP:           59.1 mmHg MV E/A ratio:  2.40                            SHUNTS                            Systemic VTI:  0.21 m                            Systemic Diam: 2.15 cm Toribio Fuel MD Electronically signed by Toribio Fuel MD Signature Date/Time: 05/28/2024/9:23:17 PM    Final     Lab Results:  CBC     Component Value Date/Time   WBC 4.2 06/02/2024 1600   RBC 4.30 06/02/2024 1600   HGB 12.4 (L) 06/02/2024 1600   HGB 12.0 (L) 04/13/2024 1245   HCT 40.9 06/02/2024 1600   HCT 37.8 04/13/2024 1245   PLT 152 06/02/2024 1600   PLT 191 04/13/2024 1245   MCV 95.1 06/02/2024 1600   MCV 90 04/13/2024 1245   MCH 28.8 06/02/2024 1600   MCHC 30.3 06/02/2024 1600   RDW 15.6 (H) 06/02/2024 1600   RDW 14.2 04/13/2024 1245   LYMPHSABS 0.6 (L) 05/29/2024 0740   MONOABS 0.9 05/29/2024 0740   EOSABS 0.1 05/29/2024 0740   BASOSABS 0.0 05/29/2024 0740    BMET    Component Value Date/Time   NA 138 06/02/2024 1600   NA 139 04/13/2024 1245   K 4.1 06/02/2024 1600   CL 100 06/02/2024 1600   CO2 30 06/02/2024 1600   GLUCOSE 125 (H) 06/02/2024 1600   BUN 27 (H) 06/02/2024 1600   BUN 27 04/13/2024 1245   CREATININE 0.94 06/02/2024 1600   CREATININE 1.02 05/12/2024 1253   CALCIUM 9.5 06/02/2024 1600   GFRNONAA >60 06/02/2024 1600   GFRNONAA >60 05/12/2024 1253   GFRAA >60 06/17/2018 0457    BNP    Component Value Date/Time   BNP 1,089.6 (H) 03/21/2024 0548    ProBNP    Component Value Date/Time   PROBNP 3,437.0 (H) 06/02/2024 1609    Specialty Problems       Pulmonary Problems   Acute respiratory failure (HCC)   Pleural effusion   Pleural effusion on right   Recurrent right pleural effusion   ILD (interstitial lung disease) (HCC)    No Known Allergies  Immunization History  Administered Date(s) Administered   Influenza-Unspecified 08/16/2023   PFIZER Comirnaty (Gray Top)Covid-19 Tri-Sucrose Vaccine 05/05/2021   PFIZER(Purple Top)SARS-COV-2 Vaccination 11/04/2019, 11/22/2019, 10/01/2020    Past Medical History:  Diagnosis Date   Arthritis    oa   BPH (benign prostatic hyperplasia)    Cancer (HCC) 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   Cataracts, bilateral    Glaucoma    Hypertension    Vitamin D  deficiency     Tobacco History: Social History   Tobacco  Use  Smoking Status Former   Current packs/day: 0.00   Average packs/day: 1.5 packs/day for 10.0 years (15.0 ttl pk-yrs)   Types: Cigarettes   Start date: 52  Quit date: 50   Years since quitting: 55.7  Smokeless Tobacco Never   Counseling given: Not Answered   Continue to not smoke  Outpatient Encounter Medications as of 06/24/2024  Medication Sig   acetaminophen  (TYLENOL ) 325 MG tablet Take 650 mg by mouth daily as needed for moderate pain (pain score 4-6) or mild pain (pain score 1-3).   albuterol  (VENTOLIN  HFA) 108 (90 Base) MCG/ACT inhaler Inhale 2 puffs into the lungs every 4 (four) hours as needed for shortness of breath.   amiodarone  (PACERONE ) 200 MG tablet Take 1 tablet (200 mg total) by mouth daily.   cholecalciferol  (VITAMIN D ) 1000 units tablet Take 1,000 Units by mouth daily.   dapagliflozin  propanediol (FARXIGA ) 10 MG TABS tablet Take 1 tablet (10 mg total) by mouth daily before breakfast.   docusate sodium  (COLACE) 100 MG capsule Take 1 capsule (100 mg total) by mouth 2 (two) times daily as needed for mild constipation.   dorzolamide -timolol  (COSOPT ) 22.3-6.8 MG/ML ophthalmic solution Place 1 drop into both eyes 2 (two) times daily.   ELIQUIS  5 MG TABS tablet Take 5 mg by mouth 2 (two) times daily.   feeding supplement (BOOST HIGH PROTEIN) LIQD Take 1 Container by mouth daily.   feeding supplement (ENSURE PLUS HIGH PROTEIN) LIQD Take 237 mLs by mouth 3 (three) times daily between meals.   finasteride  (PROSCAR ) 5 MG tablet Take 5 mg by mouth daily.   furosemide  (LASIX ) 20 MG tablet Take 1 tablet (20 mg total) by mouth daily.   levothyroxine  (SYNTHROID ) 75 MCG tablet Take 1 tablet (75 mcg total) by mouth daily at 6 (six) AM.   mirtazapine  (REMERON ) 7.5 MG tablet Take 1 tablet (7.5 mg total) by mouth at bedtime.   Multiple Vitamin (MULTIVITAMIN WITH MINERALS) TABS tablet Take 1 tablet by mouth daily.   Nutritional Supplements (,FEEDING SUPPLEMENT, PROSOURCE PLUS)  liquid Take 30 mLs by mouth 2 (two) times daily before lunch and supper.   polyethylene glycol (MIRALAX  / GLYCOLAX ) 17 g packet Take 17 g by mouth 2 (two) times daily as needed.   predniSONE  (DELTASONE ) 10 MG tablet Take 4 tablets (40 mg total) by mouth daily with breakfast for 7 days, THEN 3 tablets (30 mg total) daily with breakfast for 7 days, THEN 2 tablets (20 mg total) daily with breakfast for 7 days, THEN 1 tablet (10 mg total) daily with breakfast for 7 days.   sildenafil (VIAGRA) 25 MG tablet Take 25 mg by mouth as needed for erectile dysfunction.   No facility-administered encounter medications on file as of 06/24/2024.     Review of Systems  Review of Systems  N/a Physical Exam  BP 124/78   Pulse 91   Ht 6' 1 (1.854 m)   Wt 151 lb (68.5 kg)   SpO2 93%   PF (!) 3 L/min   BMI 19.92 kg/m   Wt Readings from Last 5 Encounters:  06/24/24 151 lb (68.5 kg)  06/22/24 154 lb 8.7 oz (70.1 kg)  06/18/24 153 lb 11.2 oz (69.7 kg)  06/04/24 152 lb 9.6 oz (69.2 kg)  06/01/24 155 lb (70.3 kg)    BMI Readings from Last 5 Encounters:  06/24/24 19.92 kg/m  06/22/24 20.39 kg/m  06/18/24 20.28 kg/m  06/04/24 20.13 kg/m  06/01/24 20.45 kg/m     Physical Exam General: Chronically ill-appearing sitting up in chair Eyes: EOMI Neck: Supple, no JVP appreciated Pulmonary: Diminished in bilateral bases, normal work of breathing   Assessment & Plan:  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  Possibly just increased oxygen  need or higher acuity than anticipated.  Doing better with increased flow rate to 3 L.  More active etc.  Also improved with prednisone  with mild elevation in sed rate.  Again unclear if this is post-COVID fibrosis versus NSIP.  Continue prednisone  taper.  Consider steroid sparing agents if symptoms worsen and need to resume higher prednisone  dose in the future.  Enrolling in pulmonary rehab, assess response  and ability to decrease oxygen .  Currently using 3 L to maintain saturations greater 90%.   Return in about 3 months (around 09/23/2024) for f/u Dr. Annella.   Donnice JONELLE Annella, MD 06/24/2024

## 2024-06-24 NOTE — Patient Instructions (Signed)
 Next I see you again  Glad you are feeling better  I think the prednisone  is helping some, I also think keeping your oxygen  saturation above 90 is also helping  Lets see if we can decrease oxygen  over time, starting with rehab next week  Return to clinic in 3 months or sooner as needed with Dr. Annella

## 2024-06-27 DIAGNOSIS — I5020 Unspecified systolic (congestive) heart failure: Secondary | ICD-10-CM | POA: Diagnosis not present

## 2024-06-30 ENCOUNTER — Encounter (HOSPITAL_COMMUNITY)
Admission: RE | Admit: 2024-06-30 | Discharge: 2024-06-30 | Disposition: A | Discharge status: Home / Self Care | Source: Ambulatory Visit | Attending: Pulmonary Disease | Admitting: Pulmonary Disease

## 2024-06-30 VITALS — Wt 148.1 lb

## 2024-06-30 DIAGNOSIS — J849 Interstitial pulmonary disease, unspecified: Secondary | ICD-10-CM | POA: Diagnosis not present

## 2024-06-30 NOTE — Progress Notes (Signed)
 Daily Session Note  Patient Details  Name: Samuel Porter MRN: [id number] Date of Birth: [birth year] Referring Provider:   [name] Pulmonary Rehab Walk Test from 06/22/2024 in [facility]  Referring Provider [name]    Encounter Date: 06/30/2024  Check In:  Session Check In - 06/30/24 1147       Check-In   Supervising physician immediately available to respond to emergencies [facility] MD immediately available    Physician(s) Rosabel Mose, NP    Location [facility]    Staff Present Ronal Levin, RN, Maud Moats, MS, ACSM-CEP, Exercise Physiologist;Tniya Bowditch Claudene, RT;Randi Midge BS, ACSM-CEP, Exercise Physiologist;Joseph Lennon, RN, BSN    Virtual Visit No    Medication changes reported     No    Fall or balance concerns reported    No    Tobacco Cessation No Change    Warm-up and Cool-down Performed as group-led instruction   orientation   Resistance Training Performed Yes    VAD Patient? No    PAD/SET Patient? No      Pain Assessment   Currently in Pain? No/denies    Multiple Pain Sites No          Capillary Blood Glucose: No results found for this or any previous visit (from the past 24 hours).   Exercise Prescription Changes - 06/30/24 1100       Response to Exercise   Blood Pressure (Admit) 128/58    Blood Pressure (Exercise) 146/78    Blood Pressure (Exit) 108/70    Heart Rate (Admit) 46 bpm    Heart Rate (Exercise) 54 bpm    Heart Rate (Exit) 48 bpm    Oxygen  Saturation (Admit) 98 %    Oxygen  Saturation (Exercise) 96 %    Oxygen  Saturation (Exit) 95 %    Rating of Perceived Exertion (Exercise) 12    Perceived Dyspnea (Exercise) 1    Duration Continue with 30 min of aerobic exercise without signs/symptoms of physical distress.    Intensity THRR unchanged      Progression   Progression Continue to progress workloads to maintain intensity without signs/symptoms of physical  distress.      Resistance Training   Training Prescription Yes    Weight red bands    Reps 10-15    Time 10 Minutes      Oxygen    Oxygen  Continuous    Liters 2      NuStep   Level 2    SPM 69    Minutes 15    METs 2      Track   Laps 5    Minutes 15    METs 1.77      Oxygen    Maintain Oxygen  Saturation 88% or higher          Social History   Tobacco Use  Smoking Status Former   Current packs/day: 0.00   Average packs/day: 1.5 packs/day for 10.0 years (15.0 ttl pk-yrs)   Types: Cigarettes   Start date: 63   Quit date: 60   Years since quitting: 55.7  Smokeless Tobacco Never    Goals Met:  Proper associated with RPD/PD & O2 Sat Independence with exercise equipment Exercise tolerated well No report of concerns or symptoms today Strength training completed today  Goals Unmet:  Not Applicable  Comments: Service time is from 1014 to 1134.    Dr. Slater Staff is Medical Director for Pulmonary Rehab  at [facility].

## 2024-07-02 ENCOUNTER — Encounter (HOSPITAL_COMMUNITY)
Admission: RE | Admit: 2024-07-02 | Discharge: 2024-07-02 | Disposition: A | Discharge status: Home / Self Care | Source: Ambulatory Visit | Attending: Pulmonary Disease | Admitting: Pulmonary Disease

## 2024-07-02 DIAGNOSIS — J849 Interstitial pulmonary disease, unspecified: Secondary | ICD-10-CM | POA: Diagnosis not present

## 2024-07-02 NOTE — Progress Notes (Signed)
 Daily Session Note  Patient Details  Name: Samuel Porter MRN: [id number] Date of Birth: 05-15-1941 Referring Provider:   [name] Pulmonary Rehab Walk Test from 06/22/2024 in [facility]  Referring Provider [name]    Encounter Date: 07/02/2024  Check In:  Session Check In - 07/02/24 1035       Check-In   Supervising physician immediately available to respond to emergencies [facility] MD immediately available    Physician(s) Damien Braver, NP    Location [facility]    Staff Present Ronal Levin, RN, Maud Moats, MS, ACSM-CEP, Exercise Physiologist;Casey Claudene Idelia Aris BS, ACSM-CEP, Exercise Physiologist    Virtual Visit No    Medication changes reported     No    Fall or balance concerns reported    No    Tobacco Cessation No Change    Warm-up and Cool-down Performed as group-led instruction   orientation   Resistance Training Performed Yes    VAD Patient? No    PAD/SET Patient? No      Pain Assessment   Currently in Pain? No/denies    Pain Score 0-No pain    Multiple Pain Sites No          Capillary Blood Glucose: No results found for this or any previous visit (from the past 24 hours).    Social History   Tobacco Use  Smoking Status Former   Current packs/day: 0.00   Average packs/day: 1.5 packs/day for 10.0 years (15.0 ttl pk-yrs)   Types: Cigarettes   Start date: 62   Quit date: 1970   Years since quitting: 55.7  Smokeless Tobacco Never    Goals Met:  Exercise tolerated well No report of concerns or symptoms today Strength training completed today  Goals Unmet:  Not Applicable  Comments: Service time is from 1006 to 1149    Dr. Slater Staff is Medical Director for Pulmonary Rehab at [facility].

## 2024-07-07 ENCOUNTER — Encounter (HOSPITAL_COMMUNITY)
Admission: RE | Admit: 2024-07-07 | Discharge: 2024-07-07 | Disposition: A | Discharge status: Home / Self Care | Source: Ambulatory Visit | Attending: Pulmonary Disease | Admitting: Pulmonary Disease

## 2024-07-07 DIAGNOSIS — J849 Interstitial pulmonary disease, unspecified: Secondary | ICD-10-CM | POA: Diagnosis not present

## 2024-07-07 NOTE — Progress Notes (Signed)
 Daily Session Note  Patient Details  Name: Samuel Porter MRN: [id number] Date of Birth: [birth year] Referring Provider:   [name] Pulmonary Rehab Walk Test from 06/22/2024 in [facility]  Referring Provider [name]    Encounter Date: 07/07/2024  Check In:  Session Check In - 07/07/24 1106       Check-In   Supervising physician immediately available to respond to emergencies [facility] MD immediately available    Physician(s) Damien Braver, NP    Location [facility]    Staff Present Ronal Levin, RN, Maud Moats, MS, ACSM-CEP, Exercise Physiologist;Casey Claudene Idelia Aris BS, ACSM-CEP, Exercise Physiologist    Virtual Visit No    Medication changes reported     No    Fall or balance concerns reported    No    Tobacco Cessation No Change    Warm-up and Cool-down Performed as group-led instruction    Resistance Training Performed Yes    VAD Patient? No    PAD/SET Patient? No          Capillary Blood Glucose: No results found for this or any previous visit (from the past 24 hours).    Social History   Tobacco Use  Smoking Status Former   Current packs/day: 0.00   Average packs/day: 1.5 packs/day for 10.0 years (15.0 ttl pk-yrs)   Types: Cigarettes   Start date: 43   Quit date: 1970   Years since quitting: 55.7  Smokeless Tobacco Never    Goals Met:  Proper associated with RPD/PD & O2 Sat Exercise tolerated well No report of concerns or symptoms today Strength training completed today  Goals Unmet:  Not Applicable  Comments: Service time is from 1012 to 1138.    Dr. Slater Staff is Medical Director for Pulmonary Rehab at [facility].

## 2024-07-09 ENCOUNTER — Encounter (HOSPITAL_COMMUNITY)
Admission: RE | Admit: 2024-07-09 | Discharge: 2024-07-09 | Disposition: A | Discharge status: Home / Self Care | Source: Ambulatory Visit | Attending: Pulmonary Disease | Admitting: Pulmonary Disease

## 2024-07-09 DIAGNOSIS — J849 Interstitial pulmonary disease, unspecified: Secondary | ICD-10-CM

## 2024-07-09 NOTE — Progress Notes (Signed)
 Daily Session Note  Patient Details  Name: Samuel Porter MRN: [id number] Date of Birth: Jun 05, 1941 Referring Provider:   [name] Pulmonary Rehab Walk Test from 06/22/2024 in [facility]  Referring Provider [name]    Encounter Date: 07/09/2024  Check In:  Session Check In - 07/09/24 1055       Check-In   Supervising physician immediately available to respond to emergencies [facility] MD immediately available    Physician(s) Lum Louis, NP    Location [facility]    Staff Present Ronal Levin, RN, BSN;Roxanna Mcever Claudene, RT;Randi Monument BS, ACSM-CEP, Exercise Physiologist;David [place], MS, ACSM-CEP, CCRP, Exercise Physiologist    Virtual Visit No    Medication changes reported     No    Fall or balance concerns reported    No    Tobacco Cessation No Change    Warm-up and Cool-down Performed as group-led instruction    Resistance Training Performed Yes    VAD Patient? No    PAD/SET Patient? No      Pain Assessment   Currently in Pain? No/denies    Multiple Pain Sites No          Capillary Blood Glucose: No results found for this or any previous visit (from the past 24 hours).    Social History   Tobacco Use  Smoking Status Former   Current packs/day: 0.00   Average packs/day: 1.5 packs/day for 10.0 years (15.0 ttl pk-yrs)   Types: Cigarettes   Start date: 16   Quit date: 1970   Years since quitting: 55.7  Smokeless Tobacco Never    Goals Met:  Proper associated with RPD/PD & O2 Sat Independence with exercise equipment Exercise tolerated well No report of concerns or symptoms today Strength training completed today  Goals Unmet:  Not Applicable  Comments: Service time is from 1016 to 1145.    Dr. Slater Staff is Medical Director for Pulmonary Rehab at [facility].

## 2024-07-14 ENCOUNTER — Encounter (HOSPITAL_COMMUNITY)
Admission: RE | Admit: 2024-07-14 | Discharge: 2024-07-14 | Disposition: A | Discharge status: Home / Self Care | Source: Ambulatory Visit | Attending: Pulmonary Disease | Admitting: Pulmonary Disease

## 2024-07-14 VITALS — Wt 154.3 lb

## 2024-07-14 DIAGNOSIS — J849 Interstitial pulmonary disease, unspecified: Secondary | ICD-10-CM

## 2024-07-14 NOTE — Progress Notes (Signed)
 Daily Session Note  Patient Details  Name: Samuel Porter MRN: [id number] Date of Birth: 1940/10/26 Referring Provider:   [name] Pulmonary Rehab Walk Test from 06/22/2024 in [facility]  Referring Provider [name]    Encounter Date: 07/14/2024  Check In:  Session Check In - 07/14/24 1152       Check-In   Supervising physician immediately available to respond to emergencies [facility] MD immediately available    Physician(s) Rosaline Bane, NP    Location [facility]    Staff Present Ronal Levin, RN, BSN;Casey Claudene, RT;Carlette Bernett, RN, Maud Moats, MS, ACSM-CEP, Exercise Physiologist    Virtual Visit No    Medication changes reported     No    Fall or balance concerns reported    No    Tobacco Cessation No Change    Warm-up and Cool-down Performed as group-led instruction    Resistance Training Performed Yes    VAD Patient? No    PAD/SET Patient? No      Pain Assessment   Currently in Pain? No/denies    Pain Score 0-No pain    Multiple Pain Sites No          Capillary Blood Glucose: No results found for this or any previous visit (from the past 24 hours).   Exercise Prescription Changes - 07/14/24 1200       Response to Exercise   Blood Pressure (Admit) 102/60    Blood Pressure (Exercise) 148/68    Blood Pressure (Exit) 122/76    Heart Rate (Admit) 47 bpm    Heart Rate (Exercise) 65 bpm    Heart Rate (Exit) 56 bpm    Oxygen  Saturation (Admit) 99 %   2L   Oxygen  Saturation (Exercise) 89 %   3L   Oxygen  Saturation (Exit) 92 %   3L POC   Rating of Perceived Exertion (Exercise) 13    Perceived Dyspnea (Exercise) 1    Duration Continue with 30 min of aerobic exercise without signs/symptoms of physical distress.    Intensity THRR unchanged      Progression   Progression Continue to progress workloads to maintain intensity without signs/symptoms of physical distress.       Resistance Training   Training Prescription Yes    Weight red bands    Reps 10-15    Time 10 Minutes      Oxygen    Oxygen  Continuous    Liters 2      NuStep   Level 2    SPM 75    Minutes 15    METs 2.2      Track   Laps 7    Minutes 15    METs 2.08      Oxygen    Maintain Oxygen  Saturation 88% or higher          Social History   Tobacco Use  Smoking Status Former   Current packs/day: 0.00   Average packs/day: 1.5 packs/day for 10.0 years (15.0 ttl pk-yrs)   Types: Cigarettes   Start date: 47   Quit date: 1970   Years since quitting: 55.7  Smokeless Tobacco Never    Goals Met:  Exercise tolerated well No report of concerns or symptoms today Strength training completed today  Goals Unmet:  Not Applicable  Comments: Service time is from 1011 to 1141    Dr. Slater Staff is Medical Director for Pulmonary Rehab at [facility].

## 2024-07-15 NOTE — Progress Notes (Signed)
 Pulmonary Individual Treatment Plan  Patient Details  Name: Samuel Porter MRN: [id number] Date of Birth: 06/23/1941 Referring Provider:   [name] Pulmonary Rehab Walk Test from 06/22/2024 in [facility]  Referring Provider [name]    Initial Encounter Date:  Flowsheet Row Pulmonary Rehab Walk Test from 06/22/2024 in [facility]  Date 06/22/24    Visit Diagnosis: ILD (interstitial lung disease) (HCC)  Patient's Home Medications on Admission:   Current Outpatient Medications:    acetaminophen  (TYLENOL ) 325 MG tablet, Take 650 mg by mouth daily as needed for moderate pain (pain score 4-6) or mild pain (pain score 1-3)., Disp: , Rfl:    albuterol  (VENTOLIN  HFA) 108 (90 Base) MCG/ACT inhaler, Inhale 2 puffs into the lungs every 4 (four) hours as needed for shortness of breath., Disp: , Rfl:    amiodarone  (PACERONE ) 200 MG tablet, Take 1 tablet (200 mg total) by mouth daily., Disp: , Rfl:    cholecalciferol  (VITAMIN D ) 1000 units tablet, Take 1,000 Units by mouth daily., Disp: , Rfl:    dapagliflozin  propanediol (FARXIGA ) 10 MG TABS tablet, Take 1 tablet (10 mg total) by mouth daily before breakfast., Disp: 30 tablet, Rfl: 11   docusate sodium  (COLACE) 100 MG capsule, Take 1 capsule (100 mg total) by mouth 2 (two) times daily as needed for mild constipation., Disp: , Rfl:    dorzolamide -timolol  (COSOPT ) 22.3-6.8 MG/ML ophthalmic solution, Place 1 drop into both eyes 2 (two) times daily., Disp: , Rfl:    ELIQUIS  5 MG TABS tablet, Take 5 mg by mouth 2 (two) times daily., Disp: , Rfl:    feeding supplement (BOOST HIGH PROTEIN) LIQD, Take 1 Container by mouth daily., Disp: , Rfl:    feeding supplement (ENSURE PLUS HIGH PROTEIN) LIQD, Take 237 mLs by mouth 3 (three) times daily between meals., Disp: , Rfl:    finasteride  (PROSCAR ) 5 MG tablet, Take 5 mg by mouth daily., Disp: ,  Rfl:    furosemide  (LASIX ) 20 MG tablet, Take 1 tablet (20 mg total) by mouth daily., Disp: 90 tablet, Rfl: 3   levothyroxine  (SYNTHROID ) 75 MCG tablet, Take 1 tablet (75 mcg total) by mouth daily at 6 (six) AM., Disp: 90 tablet, Rfl: 3   mirtazapine  (REMERON ) 7.5 MG tablet, Take 1 tablet (7.5 mg total) by mouth at bedtime., Disp: 30 tablet, Rfl: 0   Multiple Vitamin (MULTIVITAMIN WITH MINERALS) TABS tablet, Take 1 tablet by mouth daily., Disp: 30 tablet, Rfl: 0   Nutritional Supplements (,FEEDING SUPPLEMENT, PROSOURCE PLUS) liquid, Take 30 mLs by mouth 2 (two) times daily before lunch and supper., Disp: 887 mL, Rfl: 0   polyethylene glycol (MIRALAX  / GLYCOLAX ) 17 g packet, Take 17 g by mouth 2 (two) times daily as needed., Disp: , Rfl:    sildenafil (VIAGRA) 25 MG tablet, Take 25 mg by mouth as needed for erectile dysfunction., Disp: , Rfl:   Past Medical History: Past Medical History:  Diagnosis Date   Arthritis    oa   BPH (benign prostatic hyperplasia)    Cancer (HCC) 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   Cataracts, bilateral    Glaucoma    Hypertension    Vitamin D  deficiency     Tobacco Use: Social History   Tobacco Use  Smoking Status Former   Current packs/day: 0.00   Average packs/day: 1.5 packs/day for 10.0 years (15.0  ttl pk-yrs)   Types: Cigarettes   Start date: 85   Quit date: 1970   Years since quitting: 55.7  Smokeless Tobacco Never    Labs: Review Flowsheet       Latest Ref Rng & Units 07/11/2023 03/23/2024  Labs for ITP Cardiac and Pulmonary Rehab  Bicarbonate 20.0 - 28.0 mmol/L 38.1  39.9  39.7   TCO2 22 - 32 mmol/L 40  42  42   O2 Saturation % 54  69  67     Details       Multiple values from one day are sorted in reverse-chronological order         Capillary Blood Glucose: Lab Results  Component Value Date   GLUCAP 147 (H) 07/12/2023   GLUCAP 98 11/27/2022     Pulmonary Assessment Scores:  Pulmonary Assessment Scores     Row  Name 06/22/24 0920         ADL UCSD   ADL Phase Entry     SOB Score total 32       CAT Score   CAT Score 2       mMRC Score   mMRC Score 2       UCSD: Self-administered rating of dyspnea associated with activities of daily living (ADLs) 6-point scale (0 = not at all to 5 = maximal or unable to do because of breathlessness)  Scoring Scores range from 0 to 120.  Minimally important difference is 5 units  CAT: CAT can identify the health impairment of COPD patients and is better correlated with disease progression.  CAT has a scoring range of zero to 40. The CAT score is classified into four groups of low (less than 10), medium (10 - 20), high (21-30) and very high (31-40) based on the impact level of disease on health status. A CAT score over 10 suggests significant symptoms.  A worsening CAT score could be explained by an exacerbation, poor medication adherence, poor inhaler technique, or progression of COPD or comorbid conditions.  CAT MCID is 2 points  mMRC: mMRC (Modified Medical Research Council) Dyspnea Scale is used to assess the degree of baseline functional disability in patients of respiratory disease due to dyspnea. No minimal important difference is established. A decrease in score of 1 point or greater is considered a positive change.   Pulmonary Function Assessment:  Pulmonary Function Assessment - 06/22/24 0920       Breath   Shortness of Breath Yes;Limiting activity          Exercise Target Goals: Exercise Program Goal: Individual exercise prescription set using results from initial 6 min walk test and THRR while considering  patient's activity barriers and safety.   Exercise Prescription Goal: Initial exercise prescription builds to 30-45 minutes a day of aerobic activity, 2-3 days per week.  Home exercise guidelines will be given to patient during program as part of exercise prescription that the participant will acknowledge.  Activity Barriers &  Risk Stratification:  Activity Barriers & Cardiac Risk Stratification - 06/22/24 0935       Activity Barriers & Cardiac Risk Stratification   Activity Barriers Deconditioning;Balance Concerns;Muscular Weakness;Shortness of Breath;Assistive Device;Right Hip Replacement;Left Hip Replacement          6 Minute Walk:  6 Minute Walk     Row Name 06/22/24 1026         6 Minute Walk   Phase Initial     Distance 770 feet     Walk  Time 6 minutes     # of Rest Breaks 0     MPH 1.46     METS 1.65     RPE 11     Perceived Dyspnea  1     VO2 Peak 5.76     Symptoms No     Resting HR 50 bpm     Resting BP 132/68     Resting Oxygen  Saturation  99 %     Exercise Oxygen  Saturation  during 6 min walk 97 %     Max Ex. HR 69 bpm     Max Ex. BP 150/60     2 Minute Post BP 128/66       Interval HR   1 Minute HR 64     2 Minute HR 65     3 Minute HR 66     4 Minute HR 69     5 Minute HR 69     6 Minute HR 69     2 Minute Post HR 66     Interval Heart Rate? Yes       Interval Oxygen    Interval Oxygen ? Yes     Baseline Oxygen  Saturation % 99 %     1 Minute Oxygen  Saturation % 100 %     1 Minute Liters of Oxygen  3 L     2 Minute Oxygen  Saturation % 99 %     2 Minute Liters of Oxygen  3 L     3 Minute Oxygen  Saturation % 98 %     3 Minute Liters of Oxygen  3 L     4 Minute Oxygen  Saturation % 99 %     4 Minute Liters of Oxygen  3 L     5 Minute Oxygen  Saturation % 97 %     5 Minute Liters of Oxygen  3 L     6 Minute Oxygen  Saturation % 97 %     6 Minute Liters of Oxygen  3 L     2 Minute Post Oxygen  Saturation % 97 %     2 Minute Post Liters of Oxygen  3 L        Oxygen  Initial Assessment:  Oxygen  Initial Assessment - 06/22/24 0919       Home Oxygen    Home Oxygen  Device Portable Concentrator;Home Concentrator    Sleep Oxygen  Prescription Continuous    Liters per minute 1    Home Exercise Oxygen  Prescription Pulsed    Liters per minute 3    Home Resting Oxygen  Prescription  Pulsed    Liters per minute 2    Compliance with Home Oxygen  Use Yes      Initial 6 min Walk   Oxygen  Used Continuous    Liters per minute 3      Program Oxygen  Prescription   Program Oxygen  Prescription Continuous    Liters per minute 3      Intervention   Short Term Goals To learn and exhibit compliance with exercise, home and travel O2 prescription;To learn and understand importance of maintaining oxygen  saturations>88%;To learn and demonstrate proper use of respiratory medications;To learn and understand importance of monitoring SPO2 with pulse oximeter and demonstrate accurate use of the pulse oximeter.;To learn and demonstrate proper pursed lip breathing techniques or other breathing techniques.     Long  Term Goals Exhibits compliance with exercise, home  and travel O2 prescription;Verbalizes importance of monitoring SPO2 with pulse oximeter and return demonstration;Maintenance of O2 saturations>88%;Exhibits proper breathing techniques, such as  pursed lip breathing or other method taught during program session;Compliance with respiratory medication;Demonstrates proper use of MDI's          Oxygen  Re-Evaluation:  Oxygen  Re-Evaluation     Row Name 07/03/24 1102             Program Oxygen  Prescription   Program Oxygen  Prescription Continuous       Liters per minute 3         Home Oxygen    Home Oxygen  Device Portable Concentrator;Home Concentrator       Sleep Oxygen  Prescription Continuous       Liters per minute 1       Home Exercise Oxygen  Prescription Pulsed       Liters per minute 3       Home Resting Oxygen  Prescription Pulsed       Liters per minute 2       Compliance with Home Oxygen  Use Yes         Goals/Expected Outcomes   Short Term Goals To learn and exhibit compliance with exercise, home and travel O2 prescription;To learn and understand importance of maintaining oxygen  saturations>88%;To learn and demonstrate proper use of respiratory medications;To learn  and understand importance of monitoring SPO2 with pulse oximeter and demonstrate accurate use of the pulse oximeter.;To learn and demonstrate proper pursed lip breathing techniques or other breathing techniques.        Long  Term Goals Exhibits compliance with exercise, home  and travel O2 prescription;Verbalizes importance of monitoring SPO2 with pulse oximeter and return demonstration;Maintenance of O2 saturations>88%;Exhibits proper breathing techniques, such as pursed lip breathing or other method taught during program session;Compliance with respiratory medication;Demonstrates proper use of MDI's       Goals/Expected Outcomes Compliance and understanding of oxygen  saturation monitoring and breathing techniques to decrease shortness of breath.          Oxygen  Discharge (Final Oxygen  Re-Evaluation):  Oxygen  Re-Evaluation - 07/03/24 1102       Program Oxygen  Prescription   Program Oxygen  Prescription Continuous    Liters per minute 3      Home Oxygen    Home Oxygen  Device Portable Concentrator;Home Concentrator    Sleep Oxygen  Prescription Continuous    Liters per minute 1    Home Exercise Oxygen  Prescription Pulsed    Liters per minute 3    Home Resting Oxygen  Prescription Pulsed    Liters per minute 2    Compliance with Home Oxygen  Use Yes      Goals/Expected Outcomes   Short Term Goals To learn and exhibit compliance with exercise, home and travel O2 prescription;To learn and understand importance of maintaining oxygen  saturations>88%;To learn and demonstrate proper use of respiratory medications;To learn and understand importance of monitoring SPO2 with pulse oximeter and demonstrate accurate use of the pulse oximeter.;To learn and demonstrate proper pursed lip breathing techniques or other breathing techniques.     Long  Term Goals Exhibits compliance with exercise, home  and travel O2 prescription;Verbalizes importance of monitoring SPO2 with pulse oximeter and return  demonstration;Maintenance of O2 saturations>88%;Exhibits proper breathing techniques, such as pursed lip breathing or other method taught during program session;Compliance with respiratory medication;Demonstrates proper use of MDI's    Goals/Expected Outcomes Compliance and understanding of oxygen  saturation monitoring and breathing techniques to decrease shortness of breath.          Initial Exercise Prescription:  Initial Exercise Prescription - 06/22/24 1000       Date of Initial Exercise RX and  Referring Provider   Date 06/22/24    Referring Provider [name]    Expected Discharge Date 09/17/24      Oxygen    Oxygen  Continuous    Liters 2    Maintain Oxygen  Saturation 88% or higher      NuStep   Level 1    SPM 58    Minutes 15    METs 1.43      Track   Minutes 15    METs 1.7      Prescription Details   Frequency (times per week) 2    Duration Progress to 30 minutes of continuous aerobic without signs/symptoms of physical distress      Intensity   THRR 40-80% of Max Heartrate 55-110    Ratings of Perceived Exertion 11-13    Perceived Dyspnea 0-4      Progression   Progression Continue to progress workloads to maintain intensity without signs/symptoms of physical distress.      Resistance Training   Training Prescription Yes    Weight red bands    Reps 10-15          Perform Capillary Blood Glucose checks as needed.  Exercise Prescription Changes:   Exercise Prescription Changes     Row Name 06/30/24 1100 07/14/24 1200           Response to Exercise   Blood Pressure (Admit) 128/58 102/60      Blood Pressure (Exercise) 146/78 148/68      Blood Pressure (Exit) 108/70 122/76      Heart Rate (Admit) 46 bpm 47 bpm      Heart Rate (Exercise) 54 bpm 65 bpm      Heart Rate (Exit) 48 bpm 56 bpm      Oxygen  Saturation (Admit) 98 % 99 %  2L      Oxygen  Saturation (Exercise) 96 % 89 %  3L      Oxygen  Saturation (Exit) 95 % 92 %  3L POC      Rating of  Perceived Exertion (Exercise) 12 13      Perceived Dyspnea (Exercise) 1 1      Duration Continue with 30 min of aerobic exercise without signs/symptoms of physical distress. Continue with 30 min of aerobic exercise without signs/symptoms of physical distress.      Intensity THRR unchanged THRR unchanged        Progression   Progression Continue to progress workloads to maintain intensity without signs/symptoms of physical distress. Continue to progress workloads to maintain intensity without signs/symptoms of physical distress.        Resistance Training   Training Prescription Yes Yes      Weight red bands red bands      Reps 10-15 10-15      Time 10 Minutes 10 Minutes        Oxygen    Oxygen  Continuous Continuous      Liters 2 2        NuStep   Level 2 2      SPM 69 75      Minutes 15 15      METs 2 2.2        Track   Laps 5 7      Minutes 15 15      METs 1.77 2.08        Oxygen    Maintain Oxygen  Saturation 88% or higher 88% or higher         Exercise Comments:   Exercise Goals  and Review:   Exercise Goals     Row Name 06/22/24 0920             Exercise Goals   Increase Physical Activity Yes       Intervention Provide advice, education, support and counseling about physical activity/exercise needs.;Develop an individualized exercise prescription for aerobic and resistive training based on initial evaluation findings, risk stratification, comorbidities and participant's personal goals.       Expected Outcomes Short Term: Attend rehab on a regular basis to increase amount of physical activity.;Long Term: Add in home exercise to make exercise part of routine and to increase amount of physical activity.;Long Term: Exercising regularly at least 3-5 days a week.       Increase Strength and Stamina Yes       Intervention Provide advice, education, support and counseling about physical activity/exercise needs.;Develop an individualized exercise prescription for aerobic  and resistive training based on initial evaluation findings, risk stratification, comorbidities and participant's personal goals.       Expected Outcomes Short Term: Increase workloads from initial exercise prescription for resistance, speed, and METs.;Short Term: Perform resistance training exercises routinely during rehab and add in resistance training at home;Long Term: Improve cardiorespiratory fitness, muscular endurance and strength as measured by increased METs and functional capacity ( )       Able to understand and use rate of perceived exertion (RPE) scale Yes       Intervention Provide education and explanation on how to use RPE scale       Expected Outcomes Short Term: Able to use RPE daily in rehab to express subjective intensity level;Long Term:  Able to use RPE to guide intensity level when exercising independently       Able to understand and use Dyspnea scale Yes       Intervention Provide education and explanation on how to use Dyspnea scale       Expected Outcomes Short Term: Able to use Dyspnea scale daily in rehab to express subjective sense of shortness of breath during exertion;Long Term: Able to use Dyspnea scale to guide intensity level when exercising independently       Knowledge and understanding of Target Heart Rate Range (THRR) Yes       Intervention Provide education and explanation of THRR including how the numbers were predicted and where they are located for reference       Expected Outcomes Short Term: Able to state/look up THRR;Long Term: Able to use THRR to govern intensity when exercising independently;Short Term: Able to use daily as guideline for intensity in rehab       Understanding of Exercise Prescription Yes       Intervention Provide education, explanation, and written materials on patient's individual exercise prescription       Expected Outcomes Short Term: Able to explain program exercise prescription;Long Term: Able to explain home exercise  prescription to exercise independently          Exercise Goals Re-Evaluation :  Exercise Goals Re-Evaluation     Row Name 07/03/24 1102             Exercise Goal Re-Evaluation   Exercise Goals Review Increase Physical Activity;Able to understand and use Dyspnea scale;Understanding of Exercise Prescription;Increase Strength and Stamina;Knowledge and understanding of Target Heart Rate Range (THRR);Able to understand and use rate of perceived exertion (RPE) scale       Comments Athel has completed 2 exercise sessions. He exercises for 15 min on the Nustep  and track. He averages 2.3 METs at level 2 on the Nustep and 1.92 METs on the track. He performs the warmup and cooldown standing without limitations. It is too soon to notate any discernable progressions. Will continue to monitor and progress as able.       Expected Outcomes Through exercise at rehab and home, the patient will decrease shortness of breath with daily activities and feel confident in carrying out an exercise regimen at home.          Discharge Exercise Prescription (Final Exercise Prescription Changes):  Exercise Prescription Changes - 07/14/24 1200       Response to Exercise   Blood Pressure (Admit) 102/60    Blood Pressure (Exercise) 148/68    Blood Pressure (Exit) 122/76    Heart Rate (Admit) 47 bpm    Heart Rate (Exercise) 65 bpm    Heart Rate (Exit) 56 bpm    Oxygen  Saturation (Admit) 99 %   2L   Oxygen  Saturation (Exercise) 89 %   3L   Oxygen  Saturation (Exit) 92 %   3L POC   Rating of Perceived Exertion (Exercise) 13    Perceived Dyspnea (Exercise) 1    Duration Continue with 30 min of aerobic exercise without signs/symptoms of physical distress.    Intensity THRR unchanged      Progression   Progression Continue to progress workloads to maintain intensity without signs/symptoms of physical distress.      Resistance Training   Training Prescription Yes    Weight red bands    Reps 10-15    Time 10  Minutes      Oxygen    Oxygen  Continuous    Liters 2      NuStep   Level 2    SPM 75    Minutes 15    METs 2.2      Track   Laps 7    Minutes 15    METs 2.08      Oxygen    Maintain Oxygen  Saturation 88% or higher          Nutrition:  Target Goals: Understanding of nutrition guidelines, daily intake of sodium 1500mg , cholesterol 200mg , calories 30% from fat and 7% or less from saturated fats, daily to have 5 or more servings of fruits and vegetables.  Biometrics:    Nutrition Therapy Plan and Nutrition Goals:   Nutrition Assessments:  MEDIFICTS Score Key: >=70 Need to make dietary changes  40-70 Heart Healthy Diet <= 40 Therapeutic Level Cholesterol Diet   Picture Your Plate Scores: <59 Unhealthy dietary pattern with much room for improvement. 41-50 Dietary pattern unlikely to meet recommendations for good health and room for improvement. 51-60 More healthful dietary pattern, with some room for improvement.  >60 Healthy dietary pattern, although there may be some specific behaviors that could be improved.    Nutrition Goals Re-Evaluation:   Nutrition Goals Discharge (Final Nutrition Goals Re-Evaluation):   Psychosocial: Target Goals: Acknowledge presence or absence of significant depression and/or stress, maximize coping skills, provide positive support system. Participant is able to verbalize types and ability to use techniques and skills needed for reducing stress and depression.  Initial Review & Psychosocial Screening:  Initial Psych Review & Screening - 06/22/24 0931       Initial Review   Current issues with None Identified      Family Dynamics   Good Support System? Yes    Comments 2 daughters      Barriers   Psychosocial barriers  to participate in program There are no identifiable barriers or psychosocial needs.      Screening Interventions   Interventions Encouraged to exercise          Quality of Life Scores:  Scores of 19 and  below usually indicate a poorer quality of life in these areas.  A difference of  2-3 points is a clinically meaningful difference.  A difference of 2-3 points in the total score of the Quality of Life Index has been associated with significant improvement in overall quality of life, self-image, physical symptoms, and general health in studies assessing change in quality of life.  PHQ-9: Review Flowsheet       06/22/2024 11/30/2022  Depression screen PHQ 2/9  Decreased Interest 0 0  Down, Depressed, Hopeless 0 0  PHQ - 2 Score 0 0  Altered sleeping 0 -  Tired, decreased energy 1 -  Change in appetite 0 -  Feeling bad or failure about yourself  0 -  Trouble concentrating 0 -  Moving slowly or fidgety/restless 0 -  Suicidal thoughts 0 -  PHQ-9 Score 1 -  Difficult doing work/chores Somewhat difficult -   Interpretation of Total Score  Total Score Depression Severity:  1-4 = Minimal depression, 5-9 = Mild depression, 10-14 = Moderate depression, 15-19 = Moderately severe depression, 20-27 = Severe depression   Psychosocial Evaluation and Intervention:  Psychosocial Evaluation - 06/22/24 0932       Psychosocial Evaluation & Interventions   Interventions Encouraged to exercise with the program and follow exercise prescription    Comments Avrohom denies any psychosocial barriers at this time.    Expected Outcomes For Letcher to participate in rehab without psychosocial barriers.    Continue Psychosocial Services  No Follow up required          Psychosocial Re-Evaluation:  Psychosocial Re-Evaluation     Row Name 07/06/24 1034             Psychosocial Re-Evaluation   Current issues with None Identified       Comments Monthly psychosocial re-evaluation as follows: Weylyn continues to decline any psy/soc barriers or concerns. He states he has great support from his wife and family.       Expected Outcomes For Xue to continue attending PR without any psy/soc barriers or concerns.        Interventions Encouraged to attend Pulmonary Rehabilitation for the exercise       Continue Psychosocial Services  No Follow up required          Psychosocial Discharge (Final Psychosocial Re-Evaluation):  Psychosocial Re-Evaluation - 07/06/24 1034       Psychosocial Re-Evaluation   Current issues with None Identified    Comments Monthly psychosocial re-evaluation as follows: Ebrahim continues to decline any psy/soc barriers or concerns. He states he has great support from his wife and family.    Expected Outcomes For Danner to continue attending PR without any psy/soc barriers or concerns.    Interventions Encouraged to attend Pulmonary Rehabilitation for the exercise    Continue Psychosocial Services  No Follow up required          Education: Education Goals: Education classes will be provided on a weekly basis, covering required topics. Participant will state understanding/return demonstration of topics presented.  Learning Barriers/Preferences:  Learning Barriers/Preferences - 06/22/24 0933       Learning Barriers/Preferences   Learning Barriers None    Learning Preferences None  Education Topics: Know Your Numbers Group instruction that is supported by a PowerPoint presentation. Instructor discusses importance of knowing and understanding resting, exercise, and post-exercise oxygen  saturation, heart rate, and blood pressure. Oxygen  saturation, heart rate, blood pressure, rating of perceived exertion, and dyspnea are reviewed along with a normal range for these values.  Flowsheet Row PULMONARY REHAB OTHER RESPIRATORY from 07/02/2024 in [facility] for Heart, Vascular, & Lung Health  Date 07/02/24  Educator EP  Instruction Review Code 1- Verbalizes Understanding    Exercise for the Pulmonary Patient Group instruction that is supported by a PowerPoint presentation. Instructor discusses benefits of exercise, core components of exercise,  frequency, duration, and intensity of an exercise routine, importance of utilizing pulse oximetry during exercise, safety while exercising, and options of places to exercise outside of rehab.    MET Level  Group instruction provided by PowerPoint, verbal discussion, and written material to support subject matter. Instructor reviews what METs are and how to increase METs.    Pulmonary Medications Verbally interactive group education provided by instructor with focus on inhaled medications and proper administration.   Anatomy and Physiology of the Respiratory System Group instruction provided by PowerPoint, verbal discussion, and written material to support subject matter. Instructor reviews respiratory cycle and anatomical components of the respiratory system and their functions. Instructor also reviews differences in obstructive and restrictive respiratory diseases with examples of each.    Oxygen  Safety Group instruction provided by PowerPoint, verbal discussion, and written material to support subject matter. There is an overview of "What is Oxygen " and "Why do we need it".  Instructor also reviews how to create a safe environment for oxygen  use, the importance of using oxygen  as prescribed, and the risks of noncompliance. There is a brief discussion on traveling with oxygen  and resources the patient may utilize. Flowsheet Row PULMONARY REHAB OTHER RESPIRATORY from 07/09/2024 in [facility] for Heart, Vascular, & Lung Health  Date 07/09/24  Educator RN  Instruction Review Code 1- Verbalizes Understanding    Oxygen  Use Group instruction provided by PowerPoint, verbal discussion, and written material to discuss how supplemental oxygen  is prescribed and different types of oxygen  supply systems. Resources for more information are provided.    Breathing Techniques Group instruction that is supported by demonstration and informational handouts. Instructor discusses the  benefits of pursed lip and diaphragmatic breathing and detailed demonstration on how to perform both.     Risk Factor Reduction Group instruction that is supported by a PowerPoint presentation. Instructor discusses the definition of a risk factor, different risk factors for pulmonary disease, and how the heart and lungs work together.   Pulmonary Diseases Group instruction provided by PowerPoint, verbal discussion, and written material to support subject matter. Instructor gives an overview of the different type of pulmonary diseases. There is also a discussion on risk factors and symptoms as well as ways to manage the diseases.   Stress and Energy Conservation Group instruction provided by PowerPoint, verbal discussion, and written material to support subject matter. Instructor gives an overview of stress and the impact it can have on the body. Instructor also reviews ways to reduce stress. There is also a discussion on energy conservation and ways to conserve energy throughout the day.   Warning Signs and Symptoms Group instruction provided by PowerPoint, verbal discussion, and written material to support subject matter. Instructor reviews warning signs and symptoms of stroke, heart attack, cold and flu. Instructor also reviews ways to prevent  the spread of infection.   Other Education Group or individual verbal, written, or video instructions that support the educational goals of the pulmonary rehab program.    Knowledge Questionnaire Score:  Knowledge Questionnaire Score - 06/22/24 0923       Knowledge Questionnaire Score   Pre Score 12/18          Core Components/Risk Factors/Patient Goals at Admission:  Personal Goals and Risk Factors at Admission - 06/22/24 0924       Core Components/Risk Factors/Patient Goals on Admission   Improve shortness of breath with ADL's Yes    Intervention Provide education, individualized exercise plan and daily activity instruction to help  decrease symptoms of SOB with activities of daily living.    Expected Outcomes Short Term: Improve cardiorespiratory fitness to achieve a reduction of symptoms when performing ADLs;Long Term: Be able to perform more ADLs without symptoms or delay the onset of symptoms    Increase knowledge of respiratory medications and ability to use respiratory devices properly  Yes    Intervention Provide education and demonstration as needed of appropriate use of medications, inhalers, and oxygen  therapy.    Expected Outcomes Short Term: Achieves understanding of medications use. Understands that oxygen  is a medication prescribed by physician. Demonstrates appropriate use of inhaler and oxygen  therapy.;Long Term: Maintain appropriate use of medications, inhalers, and oxygen  therapy.          Core Components/Risk Factors/Patient Goals Review:   Goals and Risk Factor Review     Row Name 07/06/24 1035             Core Components/Risk Factors/Patient Goals Review   Personal Goals Review Improve shortness of breath with ADL's;Develop more efficient breathing techniques such as purse lipped breathing and diaphragmatic breathing and practicing self-pacing with activity.;Increase knowledge of respiratory medications and ability to use respiratory devices properly.       Review Monthly review of patient's Core Components/Risk Factors/Patient Goals are as follows: Goal in progress for improving his shortness of breath with ADLs. Jermaine is working hard building up his strength and stamina. He is able to maintain his oxygen  saturation on 2L [place]. He is currently exercising on the [place] and walking the track. Goal met for developing more efficient breathing techniques such as purse lipped breathing and diaphragmatic breathing; and practicing self-pacing with activity. Teigan can perform purse lipped breathing while short of breath. He demonstrated this while performing the warmup and exercising. He can initiate PLB on his  own. He works on diaphragmatic breathing at home. Goal met for increasing his knowledge of respiratory medications and the ability to use respiratory devices properly. Our respiratory therapist has worked with Norleen and reviewed medication names, usage, directions, and side effects. All questions were answered, and he understands without assistance. Jessey will continue to benefit from the PR program for exercise and education.       Expected Outcomes Pt will show progress toward meeting expected goals and outcomes.          Core Components/Risk Factors/Patient Goals at Discharge (Final Review):   Goals and Risk Factor Review - 07/06/24 1035       Core Components/Risk Factors/Patient Goals Review   Personal Goals Review Improve shortness of breath with ADL's;Develop more efficient breathing techniques such as purse lipped breathing and diaphragmatic breathing and practicing self-pacing with activity.;Increase knowledge of respiratory medications and ability to use respiratory devices properly.    Review Monthly review of patient's Core Components/Risk Factors/Patient Goals are as follows:  Goal in progress for improving his shortness of breath with ADLs. Fay is working hard building up his strength and stamina. He is able to maintain his oxygen  saturation on 2L [place]. He is currently exercising on the [place] and walking the track. Goal met for developing more efficient breathing techniques such as purse lipped breathing and diaphragmatic breathing; and practicing self-pacing with activity. Myking can perform purse lipped breathing while short of breath. He demonstrated this while performing the warmup and exercising. He can initiate PLB on his own. He works on diaphragmatic breathing at home. Goal met for increasing his knowledge of respiratory medications and the ability to use respiratory devices properly. Our respiratory therapist has worked with Norleen and reviewed medication names, usage, directions, and side  effects. All questions were answered, and he understands without assistance. Samwise will continue to benefit from the PR program for exercise and education.    Expected Outcomes Pt will show progress toward meeting expected goals and outcomes.          ITP Comments: Pt is making expected progress toward Pulmonary Rehab goals after completing 5 session(s). Recommend continued exercise, life style modification, education, and utilization of breathing techniques to increase stamina and strength, while also decreasing shortness of breath with exertion.  Dr. Slater Staff is Medical Director for Pulmonary Rehab at [facility].

## 2024-07-16 ENCOUNTER — Encounter (HOSPITAL_COMMUNITY)
Admission: RE | Admit: 2024-07-16 | Discharge: 2024-07-16 | Disposition: A | Discharge status: Home / Self Care | Source: Ambulatory Visit | Attending: Pulmonary Disease | Admitting: Pulmonary Disease

## 2024-07-16 DIAGNOSIS — I10 Essential (primary) hypertension: Secondary | ICD-10-CM | POA: Diagnosis not present

## 2024-07-16 DIAGNOSIS — Z0000 Encounter for general adult medical examination without abnormal findings: Secondary | ICD-10-CM | POA: Diagnosis not present

## 2024-07-16 DIAGNOSIS — J849 Interstitial pulmonary disease, unspecified: Secondary | ICD-10-CM | POA: Diagnosis not present

## 2024-07-16 DIAGNOSIS — Z85828 Personal history of other malignant neoplasm of skin: Secondary | ICD-10-CM | POA: Diagnosis not present

## 2024-07-16 DIAGNOSIS — E039 Hypothyroidism, unspecified: Secondary | ICD-10-CM | POA: Diagnosis not present

## 2024-07-16 DIAGNOSIS — E559 Vitamin D deficiency, unspecified: Secondary | ICD-10-CM | POA: Diagnosis not present

## 2024-07-16 DIAGNOSIS — I4891 Unspecified atrial fibrillation: Secondary | ICD-10-CM | POA: Diagnosis not present

## 2024-07-16 DIAGNOSIS — H409 Unspecified glaucoma: Secondary | ICD-10-CM | POA: Diagnosis not present

## 2024-07-16 DIAGNOSIS — R7309 Other abnormal glucose: Secondary | ICD-10-CM | POA: Diagnosis not present

## 2024-07-16 DIAGNOSIS — Z23 Encounter for immunization: Secondary | ICD-10-CM | POA: Diagnosis not present

## 2024-07-16 NOTE — Progress Notes (Signed)
 Daily Session Note  Patient Details  Name: Samuel Porter MRN: [id number] Date of Birth: January 09, 1941 Referring Provider:   [name] Pulmonary Rehab Walk Test from 06/22/2024 in [facility] for Heart, Vascular, & Lung Health  Referring Provider [name]    Encounter Date: 07/16/2024  Check In:  Session Check In - 07/16/24 1039       Check-In   Supervising physician immediately available to respond to emergencies [facility] MD immediately available    Physician(s) Jackee Alberts, NP    Location [facility]    Staff Present Ronal Levin, RN, BSN;Casey Claudene, Neita Moats, MS, ACSM-CEP, Exercise Physiologist;Jetta Walker BS, ACSM-CEP, Exercise Physiologist    Virtual Visit No    Medication changes reported     No    Fall or balance concerns reported    No    Tobacco Cessation No Change    Warm-up and Cool-down Performed as group-led instruction    Resistance Training Performed Yes    VAD Patient? No    PAD/SET Patient? No      Pain Assessment   Currently in Pain? No/denies    Pain Score 0-No pain    Multiple Pain Sites No          Capillary Blood Glucose: No results found for this or any previous visit (from the past 24 hours).    Social History   Tobacco Use  Smoking Status Former   Current packs/day: 0.00   Average packs/day: 1.5 packs/day for 10.0 years (15.0 ttl pk-yrs)   Types: Cigarettes   Start date: 63   Quit date: 1970   Years since quitting: 55.7  Smokeless Tobacco Never    Goals Met:  Exercise tolerated well No report of concerns or symptoms today Strength training completed today  Goals Unmet:  Not Applicable  Comments: Service time is from 1006 to 1140    Dr. Slater Staff is Medical Director for Pulmonary Rehab at [facility].

## 2024-07-21 ENCOUNTER — Encounter (HOSPITAL_COMMUNITY)
Admission: RE | Admit: 2024-07-21 | Discharge: 2024-07-21 | Disposition: A | Discharge status: Home / Self Care | Source: Ambulatory Visit | Attending: Pulmonary Disease | Admitting: Pulmonary Disease

## 2024-07-21 ENCOUNTER — Ambulatory Visit: Admitting: "Endocrinology

## 2024-07-21 DIAGNOSIS — J849 Interstitial pulmonary disease, unspecified: Secondary | ICD-10-CM

## 2024-07-21 NOTE — Progress Notes (Signed)
 Pulmonary Rehab Incomplete Session Note  Patient Details  Name: Samuel Porter MRN: [id number] Date of Birth: 1941/01/01 Referring Provider:   [name] Pulmonary Rehab Walk Test from 06/22/2024 in [facility] for Heart, Vascular, & Lung Health  Referring Provider [name]    Samuel Porter did not complete his rehab session.  Samuel Porter came to [employer] class but started to have a nose bleed. Slight drops after ~20 minutes. Nose irritated by oxygen  flow and pt is on eliquis . Finally got his nosebleed to stop after ~45 minutes. Pt sent home from exercise today.

## 2024-07-22 ENCOUNTER — Ambulatory Visit: Admitting: Pulmonary Disease

## 2024-07-23 ENCOUNTER — Encounter (HOSPITAL_COMMUNITY)
Admission: RE | Admit: 2024-07-23 | Discharge: 2024-07-23 | Disposition: A | Source: Ambulatory Visit | Attending: Pulmonary Disease | Admitting: Pulmonary Disease

## 2024-07-23 DIAGNOSIS — J849 Interstitial pulmonary disease, unspecified: Secondary | ICD-10-CM | POA: Diagnosis not present

## 2024-07-23 NOTE — Progress Notes (Signed)
 Daily Session Note  Patient Details  Name: Samuel Porter MRN: [id number] Date of Birth: 1941/05/03 Referring Provider:   [name] Pulmonary Rehab Walk Test from 06/22/2024 in [facility] for Heart, Vascular, & Lung Health  Referring Provider [name]    Encounter Date: 07/23/2024  Check In:  Session Check In - 07/23/24 1112       Check-In   Supervising physician immediately available to respond to emergencies [facility] immediately available    Physician(s) Josefa Beauvais, NP    [facility]    Staff Present Ronal Levin, RN, BSN;Casey Claudene, Neita Moats, MS, ACSM-CEP, Exercise Physiologist;Randi Midge BS, ACSM-CEP, Exercise Physiologist    Virtual Visit No    Medication changes reported     No    Fall or balance concerns reported    No    Tobacco Cessation No Change    Warm-up and Cool-down Performed as group-led instruction    Resistance Training Performed Yes    VAD Patient? No    PAD/SET Patient? No      Pain Assessment   Currently in Pain? No/denies    Multiple Pain Sites No          Capillary Blood Glucose: No results found for this or any previous visit (from the past 24 hours).    Social History   Tobacco Use  Smoking Status Former   Current packs/day: 0.00   Average packs/day: 1.5 packs/day for 10.0 years (15.0 ttl pk-yrs)   Types: Cigarettes   Start date: 22   Quit date: 1970   Years since quitting: 55.8  Smokeless Tobacco Never    Goals Met:  Proper associated with RPD/PD & O2 Sat Exercise tolerated well No report of concerns or symptoms today Strength training completed today  Goals Unmet:  Not Applicable  Comments: Service time is from 1009 to 1146.    Dr. Slater Staff is Medical Director for Pulmonary Rehab at [facility].

## 2024-07-28 ENCOUNTER — Encounter (HOSPITAL_COMMUNITY)
Admission: RE | Admit: 2024-07-28 | Discharge: 2024-07-28 | Disposition: A | Source: Ambulatory Visit | Attending: Pulmonary Disease | Admitting: Pulmonary Disease

## 2024-07-28 VITALS — Wt 152.8 lb

## 2024-07-28 DIAGNOSIS — J849 Interstitial pulmonary disease, unspecified: Secondary | ICD-10-CM | POA: Diagnosis not present

## 2024-07-28 NOTE — Progress Notes (Signed)
 Daily Session Note  Patient Details  Name: Samuel Porter MRN: [id number] Date of Birth: September 16, 1941 Referring Provider:   [name] Pulmonary Rehab Walk Test from 06/22/2024 in [facility] for Heart, Vascular, & Lung Health  Referring Provider [name]    Encounter Date: 07/28/2024  Check In:  Session Check In - 07/28/24 1122       Check-In   Supervising physician immediately available to respond to emergencies [facility] MD immediately available    Physician(s) Orren Fabry, NP    [facility]    Staff Present Ronal Levin, RN, BSN;Casey Claudene, Neita Moats, MS, ACSM-CEP, Exercise Physiologist;Randi Midge HECKLE, ACSM-CEP, Exercise Physiologist;Johnny Fayette, MS, Exercise Physiologist    Virtual Visit No    Medication changes reported     No    Fall or balance concerns reported    No    Tobacco Cessation No Change    Warm-up and Cool-down Performed as group-led instruction    Resistance Training Performed Yes    VAD Patient? No    PAD/SET Patient? No      Pain Assessment   Currently in Pain? No/denies    Multiple Pain Sites No          Capillary Blood Glucose: No results found for this or any previous visit (from the past 24 hours).   Exercise Prescription Changes - 07/28/24 1100       Response to Exercise   Blood Pressure (Admit) 146/68    Blood Pressure (Exercise) 150/80    Blood Pressure (Exit) 136/60    Heart Rate (Admit) 47 bpm    Heart Rate (Exercise) 64 bpm    Heart Rate (Exit) 56 bpm    Oxygen  Saturation (Admit) 97 %   2L   Oxygen  Saturation (Exercise) 86 %   2L   Oxygen  Saturation (Exit) 95 %   3 POC   Rating of Perceived Exertion (Exercise) 11    Perceived Dyspnea (Exercise) 1    Duration Continue with 30 min of aerobic exercise without signs/symptoms of physical distress.    Intensity THRR unchanged      Progression   Progression Continue to progress workloads to maintain intensity without signs/symptoms  of physical distress.      Resistance Training   Training Prescription Yes    Weight red bands    Reps 10-15    Time 10 Minutes      Oxygen    Oxygen  Continuous    Liters 2      NuStep   Level 3    SPM 77    Minutes 15    METs 2.2      Track   Laps 7    Minutes 15    METs 2.08      Oxygen    Maintain Oxygen  Saturation 88% or higher          Social History   Tobacco Use  Smoking Status Former   Current packs/day: 0.00   Average packs/day: 1.5 packs/day for 10.0 years (15.0 ttl pk-yrs)   Types: Cigarettes   Start date: 15   Quit date: 88   Years since quitting: 55.8  Smokeless Tobacco Never    Goals Met:  Proper associated with RPD/PD & O2 Sat Exercise tolerated well No report of concerns or symptoms today Strength training completed today  Goals Unmet:  Not Applicable  Comments: Service time is from 1013 to 1138.    Dr. Slater Staff is Medical Director for  Pulmonary Rehab at [facility].

## 2024-07-30 ENCOUNTER — Encounter (HOSPITAL_COMMUNITY)
Admission: RE | Admit: 2024-07-30 | Discharge: 2024-07-30 | Disposition: A | Discharge status: Home / Self Care | Source: Ambulatory Visit | Attending: Pulmonary Disease | Admitting: Pulmonary Disease

## 2024-07-30 DIAGNOSIS — J849 Interstitial pulmonary disease, unspecified: Secondary | ICD-10-CM

## 2024-07-30 NOTE — Progress Notes (Signed)
 Daily Session Note  Patient Details  Name: Samuel Porter MRN: [id number] Date of Birth: [birth year] Referring Provider:   Conrad Porter Pulmonary Rehab Walk Test from 06/22/2024 in [facility] for Heart, Vascular, & Lung Health  Referring Provider Samuel Porter    Encounter Date: 07/30/2024  Check In:  Session Check In - 07/30/24 1039       Check-In   Supervising physician immediately available to respond to emergencies [facility] immediately available    Physician(s) Samuel Mose, NP    [facility]    Staff Present Samuel Levin, RN, BSN;Samuel Porter, Samuel Moats, MS, ACSM-CEP, Exercise Physiologist;Samuel Porter BS, ACSM-CEP, Exercise Physiologist    Virtual Visit No    Medication changes reported     No    Fall or balance concerns reported    No    Tobacco Cessation No Change    Warm-up and Cool-down Performed as group-led instruction    Resistance Training Performed Yes    VAD Patient? No    PAD/SET Patient? No      Pain Assessment   Currently in Pain? No/denies    Multiple Pain Sites No          Capillary Blood Glucose: No results found for this or any previous visit (from the past 24 hours).    Social History   Tobacco Use  Smoking Status Former   Current packs/day: 0.00   Average packs/day: 1.5 packs/day for 10.0 years (15.0 ttl pk-yrs)   Types: Cigarettes   Start date: 31   Quit date: 1970   Years since quitting: 55.8  Smokeless Tobacco Never    Goals Met:  Independence with exercise equipment Exercise tolerated well No report of concerns or symptoms today Strength training completed today  Goals Unmet:  Not Applicable  Comments: Service time is from 1017 to 1144    Dr. Slater Staff is Medical Director for Pulmonary Rehab at [facility].

## 2024-08-04 ENCOUNTER — Encounter (HOSPITAL_COMMUNITY)

## 2024-08-04 DIAGNOSIS — C4492 Squamous cell carcinoma of skin, unspecified: Secondary | ICD-10-CM | POA: Diagnosis not present

## 2024-08-06 ENCOUNTER — Encounter (HOSPITAL_COMMUNITY)
Admission: RE | Admit: 2024-08-06 | Discharge: 2024-08-06 | Disposition: A | Discharge status: Home / Self Care | Source: Ambulatory Visit | Attending: Pulmonary Disease | Admitting: Pulmonary Disease

## 2024-08-06 DIAGNOSIS — J849 Interstitial pulmonary disease, unspecified: Secondary | ICD-10-CM | POA: Diagnosis not present

## 2024-08-06 NOTE — Progress Notes (Signed)
 Daily Session Note  Patient Details  Name: Samuel Porter MRN: [id number] Date of Birth: 03/24/1941 Referring Provider:   [name] Pulmonary Rehab Walk Test from 06/22/2024 in [facility] for Heart, Vascular, & Lung Health  Referring Provider [name]    Encounter Date: 08/06/2024  Check In:  Session Check In - 08/06/24 1204       Check-In   Supervising physician immediately available to respond to emergencies [facility] MD immediately available    Physician(s) Damien Braver, NP    [facility]    Staff Present Ronal Levin, RN, BSN;Casey Claudene, Neita Moats, MS, ACSM-CEP, Exercise Physiologist;Randi Midge HECKLE, ACSM-CEP, Exercise Physiologist    Virtual Visit No    Medication changes reported     No    Fall or balance concerns reported    No    Tobacco Cessation No Change    Warm-up and Cool-down Performed as group-led instruction    Resistance Training Performed Yes    VAD Patient? No    PAD/SET Patient? No      Pain Assessment   Currently in Pain? No/denies    Pain Score 0-No pain    Multiple Pain Sites No          Capillary Blood Glucose: No results found for this or any previous visit (from the past 24 hours).    Social History   Tobacco Use  Smoking Status Former   Current packs/day: 0.00   Average packs/day: 1.5 packs/day for 10.0 years (15.0 ttl pk-yrs)   Types: Cigarettes   Start date: 12   Quit date: 1970   Years since quitting: 55.8  Smokeless Tobacco Never    Goals Met:  Proper associated with RPD/PD & O2 Sat Exercise tolerated well No report of concerns or symptoms today Strength training completed today  Goals Unmet:  Not Applicable  Comments: Service time is from 1002 to 1144.    Dr. Slater Staff is Medical Director for Pulmonary Rehab at [facility].

## 2024-08-07 ENCOUNTER — Ambulatory Visit: Admitting: "Endocrinology

## 2024-08-07 ENCOUNTER — Encounter: Payer: Self-pay | Admitting: "Endocrinology

## 2024-08-07 VITALS — BP 116/60 | HR 64 | Ht 73.0 in | Wt 156.0 lb

## 2024-08-07 DIAGNOSIS — E039 Hypothyroidism, unspecified: Secondary | ICD-10-CM | POA: Diagnosis not present

## 2024-08-07 NOTE — Progress Notes (Signed)
 Outpatient Endocrinology Note Samuel Birmingham, MD  08/07/24   SAVVAS Porter [birth year] [id number]  Referring Provider: Sherlon Brayton RAMAN, MD Primary Care Provider: Kip Righter, MD Subjective  No chief complaint on file.   Assessment & Plan  Diagnoses and all orders for this visit:  Acquired hypothyroidism -     TSH; Future -     T4, free; Future    Samuel Porter is currently taking levothyroxine  88mcg po every day taken with other medications.  He is on amiodarone  and was on immunotherapy but none of them led to hypothyroidism right away so the cause-and-effect is not established. Patient is currently biochemically hypothyroid. TSH was 30.8 on 07/16/2024 done at [facility]; chased records. Educated on thyroid  axis.  Recommend the following: Take levothyroxine  88 mcg every morning.  Advised to take levothyroxine  first thing in the morning on empty stomach and wait at least 30 minutes to 1 hour before eating or drinking anything or taking any other medications. Space out levothyroxine  by 4 hours from any acid reflux medication/fibrate/iron/calcium/multivitamin. Advised to take birth control pills and nutritional supplements in the evening. Repeat lab before next visit or sooner if symptoms of hyperthyroidism or hypothyroidism develop.  Notify us  immediately in case of significant weight gain or loss. Counseled on compliance and follow up needs.  I have reviewed current medications, nurse's notes, allergies, vital signs, past medical and surgical history, family medical history, and social history for this encounter. Counseled patient on symptoms, examination findings, lab findings, imaging results, treatment decisions and monitoring and prognosis. The patient understood the recommendations and agrees with the treatment plan. All questions regarding treatment plan were fully answered.   Return for visit + labs before next visit.   Samuel Birmingham, MD  08/07/24   I have  reviewed current medications, nurse's notes, allergies, vital signs, past medical and surgical history, family medical history, and social history for this encounter. Counseled patient on symptoms, examination findings, lab findings, imaging results, treatment decisions and monitoring and prognosis. The patient understood the recommendations and agrees with the treatment plan. All questions regarding treatment plan were fully answered.   History of Present Illness Samuel Porter is a 83 y.o. year old male who presents to our clinic with hypothyroidism diagnosed in 03/2024.    He is on amiodarone  200 mg every day for A.fib.  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    Physical Exam  BP 116/60   Pulse 64   Ht 6' 1 (1.854 m)   Wt 156 lb (70.8 kg)   SpO2 96%   BMI 20.58 kg/m  Constitutional: well developed, well nourished Head: normocephalic, atraumatic, no exophthalmos Eyes: sclera anicteric, no redness Neck: no thyromegaly, no thyroid  tenderness; no nodules palpated Lungs: normal respiratory effort Neurology: alert and oriented, no fine hand tremor Skin: dry, no appreciable rashes Musculoskeletal: no appreciable defects Psychiatric: normal mood and affect  Allergies No Known Allergies  Current Medications Patient's Medications  New Prescriptions   No medications on file  Previous Medications   ACETAMINOPHEN  (TYLENOL ) 325 MG TABLET  Take 650 mg by mouth daily as needed for moderate pain (pain score 4-6) or mild pain (pain score 1-3).   ALBUTEROL  (VENTOLIN  HFA)  108 (90 BASE) MCG/ACT INHALER    Inhale 2 puffs into the lungs every 4 (four) hours as needed for shortness of breath.   AMIODARONE  (PACERONE ) 200 MG TABLET    Take 1 tablet (200 mg total) by mouth daily.   CHOLECALCIFEROL  (VITAMIN D ) 1000 UNITS TABLET    Take 1,000 Units by mouth daily.   DAPAGLIFLOZIN  PROPANEDIOL (FARXIGA ) 10 MG TABS TABLET    Take 1 tablet (10 mg total) by mouth daily before breakfast.   DOCUSATE SODIUM  (COLACE) 100 MG CAPSULE    Take 1 capsule (100 mg total) by mouth 2 (two) times daily as needed for mild constipation.   DORZOLAMIDE -TIMOLOL  (COSOPT ) 22.3-6.8 MG/ML OPHTHALMIC SOLUTION    Place 1 drop into both eyes 2 (two) times daily.   ELIQUIS  5 MG TABS TABLET    Take 5 mg by mouth 2 (two) times daily.   FEEDING SUPPLEMENT (BOOST HIGH PROTEIN) LIQD    Take 1 Container by mouth daily.   FEEDING SUPPLEMENT (ENSURE PLUS HIGH PROTEIN) LIQD    Take 237 mLs by mouth 3 (three) times daily between meals.   FINASTERIDE  (PROSCAR ) 5 MG TABLET    Take 5 mg by mouth daily.   FUROSEMIDE  (LASIX ) 20 MG TABLET    Take 1 tablet (20 mg total) by mouth daily.   LEVOTHYROXINE  (SYNTHROID ) 88 MCG TABLET    88 mcg.   MIRTAZAPINE  (REMERON ) 7.5 MG TABLET    Take 1 tablet (7.5 mg total) by mouth at bedtime.   MULTIPLE VITAMIN (MULTIVITAMIN WITH MINERALS) TABS TABLET    Take 1 tablet by mouth daily.   NUTRITIONAL SUPPLEMENTS (,FEEDING SUPPLEMENT, PROSOURCE PLUS) LIQUID    Take 30 mLs by mouth 2 (two) times daily before lunch and supper.   POLYETHYLENE GLYCOL (MIRALAX  / GLYCOLAX ) 17 G PACKET    Take 17 g by mouth 2 (two) times daily as needed.   SILDENAFIL (VIAGRA) 25 MG TABLET    Take 25 mg by mouth as needed for erectile dysfunction.  Modified Medications   No medications on file  Discontinued Medications   LEVOTHYROXINE  (SYNTHROID ) 75 MCG TABLET    Take 1 tablet (75 mcg total) by mouth daily at 6 (six) AM.    Past Medical History Past Medical History:  Diagnosis Date   Arthritis    oa   BPH  (benign prostatic hyperplasia)    Cancer (HCC) 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   Cataracts, bilateral    Glaucoma    Hypertension    Vitamin D  deficiency     Past Surgical History Past Surgical History:  Procedure Laterality Date   CARDIOVERSION N/A 03/05/2024   Procedure: CARDIOVERSION;  Surgeon: Mona Vinie BROCKS, MD;  Location: [facility] INVASIVE CV LAB;  Service: Cardiovascular;  Laterality: N/A;   CARDIOVERSION N/A 03/24/2024   Procedure: CARDIOVERSION;  Surgeon: Rolan Ezra RAMAN, MD;  Location: [facility] INVASIVE CV LAB;  Service: Cardiovascular;  Laterality: N/A;   CARDIOVERSION N/A 04/24/2024   Procedure: CARDIOVERSION;  Surgeon: Okey Vina GAILS, MD;  Location: [facility] INVASIVE CV LAB;  Service: Cardiovascular;  Laterality: N/A;   CHEST TUBE INSERTION Right 03/18/2024   Procedure: CHEST TUBE INSERTION;  Surgeon: Annella Donnice SAUNDERS, MD;  Location: [facility] ENDOSCOPY;  Service: Pulmonary;  Laterality: Right;   CHOLECYSTECTOMY     colonscopy  2017  IR THORACENTESIS ASP PLEURAL SPACE W/IMG GUIDE  03/03/2024   JOINT REPLACEMENT Right    hip    RIGHT HEART CATH N/A 03/23/2024   Procedure: RIGHT HEART CATH;  Surgeon: Rolan Ezra RAMAN, MD;  Location: [facility] INVASIVE CV LAB;  Service: Cardiovascular;  Laterality: N/A;   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   Procedure: LEFT TOTAL HIP ARTHROPLASTY ANTERIOR APPROACH;  Surgeon: Dempsey Moan, MD;  Location: [facility] ORS;  Service: Orthopedics;  Laterality: Left;   VENTRAL HERNIA REPAIR N/A 07/12/2023   Procedure: HERNIA REPAIR VENTRAL;  Surgeon: Lyndel Deward PARAS, MD;  Location: [facility] OR;  Service: General;  Laterality: N/A;    Family History family history includes Cancer - Ovarian in his sister; Lymphoma in his sister.  Social History Social History   Socioeconomic History   Marital status: Married    Spouse name: Not on file   Number of children: Not on file   Years of education: Not on file   Highest education level: Not on file  Occupational History    Occupation: Retired  Tobacco Use   Smoking status: Former    Current packs/day: 0.00    Average packs/day: 1.5 packs/day for 10.0 years (15.0 ttl pk-yrs)    Types: Cigarettes    Start date: 85    Quit date: 1970    Years since quitting: 55.8   Smokeless tobacco: Never  Substance and Sexual Activity   Alcohol use: Not Currently    Comment: 1-2 glass wine per day   Drug use: No   Sexual activity: Not on file  Other Topics Concern   Not on file  Social History Narrative   Not on file   Social Drivers of Health   Financial Resource Strain: Not on file  Food Insecurity: Patient Declined (03/17/2024)   Hunger Vital Sign    Worried About Running Out of Food in the Last Year: Patient declined    Ran Out of Food in the Last Year: Patient declined  Transportation Needs: No Transportation Needs (02/04/2024)   Received from [facility]   PRAPARE - Transportation    Lack of Transportation (Medical): No    Lack of Transportation (Non-Medical): No  Physical Activity: Not on file  Stress: Not on file  Social Connections: Not on file  Intimate Partner Violence: Not At Risk (11/30/2022)   Humiliation, Afraid, Rape, and Kick questionnaire    Fear of Current or Ex-Partner: No    Emotionally Abused: No    Physically Abused: No    Sexually Abused: No    Laboratory Investigations Lab Results  Component Value Date   TSH 31.098 (H) 05/11/2024   TSH 27.131 (H) 03/17/2024   FREET4 0.78 05/11/2024   FREET4 0.71 03/17/2024     No results found for: TSI   No components found for: TRAB   No results found for: CHOL No results found for: HDL No results found for: LDLCALC No results found for: TRIG No results found for: [facility] Lab Results  Component Value Date   CREATININE 0.94 06/02/2024   No results found for: GFR    Component Value Date/Time   NA 138 06/02/2024 1600   NA 139 04/13/2024 1245   K 4.1 06/02/2024 1600   CL 100 06/02/2024 1600   CO2 30 06/02/2024  1600   GLUCOSE 125 (H) 06/02/2024 1600   BUN 27 (H) 06/02/2024 1600   BUN 27 04/13/2024 1245   CREATININE 0.94 06/02/2024 1600   CREATININE 1.02 05/12/2024 1253  CALCIUM 9.5 06/02/2024 1600   PROT 7.2 05/12/2024 1253   ALBUMIN  2.9 (L) 05/12/2024 1253   AST 25 05/12/2024 1253   ALT 19 05/12/2024 1253   ALKPHOS 146 (H) 05/12/2024 1253   BILITOT 0.7 05/12/2024 1253   GFRNONAA >60 06/02/2024 1600   GFRNONAA >60 05/12/2024 1253   GFRAA >60 06/17/2018 0457      Latest Ref Rng & Units 06/02/2024    4:00 PM 05/12/2024   12:53 PM 04/13/2024   12:45 PM  BMP  Glucose 70 - 99 mg/dL 874  895  98   BUN 8 - 23 mg/dL 27  28  27    Creatinine 0.61 - 1.24 mg/dL 9.05  8.97  8.97   BUN/Creat Ratio 10 - 24   26   Sodium 135 - 145 mmol/L 138  138  139   Potassium 3.5 - 5.1 mmol/L 4.1  4.1  5.1   Chloride 98 - 111 mmol/L 100  100  100   CO2 22 - 32 mmol/L 30  36  32   Calcium 8.9 - 10.3 mg/dL 9.5  8.9  9.0        Component Value Date/Time   WBC 4.2 06/02/2024 1600   RBC 4.30 06/02/2024 1600   HGB 12.4 (L) 06/02/2024 1600   HGB 12.0 (L) 04/13/2024 1245   HCT 40.9 06/02/2024 1600   HCT 37.8 04/13/2024 1245   PLT 152 06/02/2024 1600   PLT 191 04/13/2024 1245   MCV 95.1 06/02/2024 1600   MCV 90 04/13/2024 1245   MCH 28.8 06/02/2024 1600   MCHC 30.3 06/02/2024 1600   RDW 15.6 (H) 06/02/2024 1600   RDW 14.2 04/13/2024 1245   LYMPHSABS 0.6 (L) 05/29/2024 0740   MONOABS 0.9 05/29/2024 0740   EOSABS 0.1 05/29/2024 0740   BASOSABS 0.0 05/29/2024 0740      Parts of this note may have been dictated using voice recognition software. There may be variances in spelling and vocabulary which are unintentional. Not all errors are proofread. Please notify the dino if any discrepancies are noted or if the meaning of any statement is not clear.

## 2024-08-10 DIAGNOSIS — Z23 Encounter for immunization: Secondary | ICD-10-CM | POA: Diagnosis not present

## 2024-08-11 ENCOUNTER — Encounter (HOSPITAL_COMMUNITY)
Admission: RE | Admit: 2024-08-11 | Discharge: 2024-08-11 | Disposition: A | Discharge status: Home / Self Care | Source: Ambulatory Visit | Attending: Pulmonary Disease | Admitting: Pulmonary Disease

## 2024-08-11 VITALS — Wt 155.9 lb

## 2024-08-11 DIAGNOSIS — J849 Interstitial pulmonary disease, unspecified: Secondary | ICD-10-CM | POA: Diagnosis not present

## 2024-08-11 NOTE — Progress Notes (Signed)
 Daily Session Note  Patient Details  Name: Samuel Porter MRN: [id number] Date of Birth: 1941/02/16 Referring Provider:   [name] Pulmonary Rehab Walk Test from 06/22/2024 in [facility]  Referring Provider [name]    Encounter Date: 08/11/2024  Check In:  Session Check In - 08/11/24 1108       Check-In   Supervising physician immediately available to respond to emergencies [facility] MD immediately available    Physician(s) Lum Louis, NP    Location [facility]    Staff Present Ronal Levin, RN, BSN;Edouard Gikas Claudene, Neita Moats, MS, ACSM-CEP, Exercise Physiologist;Randi Midge BS, ACSM-CEP, Exercise Physiologist    Virtual Visit No    Medication changes reported     No    Fall or balance concerns reported    No    Tobacco Cessation No Change    Warm-up and Cool-down Performed as group-led instruction    Resistance Training Performed Yes    VAD Patient? No    PAD/SET Patient? No      Pain Assessment   Currently in Pain? No/denies    Multiple Pain Sites No          Capillary Blood Glucose: No results found for this or any previous visit (from the past 24 hours).   Exercise Prescription Changes - 08/11/24 1200       Response to Exercise   Blood Pressure (Admit) 110/60    Blood Pressure (Exercise) 146/70    Blood Pressure (Exit) 144/62    Heart Rate (Admit) 47 bpm    Heart Rate (Exercise) 62 bpm    Heart Rate (Exit) 56 bpm    Oxygen  Saturation (Admit) 98 %    Oxygen  Saturation (Exercise) 95 %    Oxygen  Saturation (Exit) 89 %    Rating of Perceived Exertion (Exercise) 13    Perceived Dyspnea (Exercise) 1    Duration Continue with 30 min of aerobic exercise without signs/symptoms of physical distress.    Intensity THRR unchanged      Progression   Progression Continue to progress workloads to maintain intensity without signs/symptoms of physical distress.      Resistance Training    Training Prescription Yes    Weight red bands    Reps 10-15    Time 10 Minutes      Oxygen    Oxygen  Continuous    Liters 3      NuStep   Level 3    SPM 78    Minutes 15    METs 2.2      Track   Laps 7    Minutes 15    METs 2.08      Oxygen    Maintain Oxygen  Saturation 88% or higher          Social History   Tobacco Use  Smoking Status Former   Current packs/day: 0.00   Average packs/day: 1.5 packs/day for 10.0 years (15.0 ttl pk-yrs)   Types: Cigarettes   Start date: 5   Quit date: 66   Years since quitting: 55.8  Smokeless Tobacco Never    Goals Met:  Proper associated with RPD/PD & O2 Sat Independence with exercise equipment Exercise tolerated well No report of concerns or symptoms today Strength training completed today  Goals Unmet:  Not Applicable  Comments: Service time is from 1010 to 1135.    Dr. Slater Staff is Medical Director for Pulmonary Rehab at [facility].

## 2024-08-12 NOTE — Progress Notes (Signed)
 Pulmonary Individual Treatment Plan  Patient Details  Name: Samuel Porter MRN: [id number] Date of Birth: Jan 19, 1941 Referring Provider:   [name] Pulmonary Rehab Walk Test from 06/22/2024 in [facility]  Referring Provider [name]    Initial Encounter Date:  Flowsheet Row Pulmonary Rehab Walk Test from 06/22/2024 in [facility]  Date 06/22/24    Visit Diagnosis: ILD (interstitial lung disease) (HCC)  Patient's Home Medications on Admission:   Current Outpatient Medications:    acetaminophen  (TYLENOL ) 325 MG tablet, Take 650 mg by mouth daily as needed for moderate pain (pain score 4-6) or mild pain (pain score 1-3)., Disp: , Rfl:    albuterol  (VENTOLIN  HFA) 108 (90 Base) MCG/ACT inhaler, Inhale 2 puffs into the lungs every 4 (four) hours as needed for shortness of breath., Disp: , Rfl:    amiodarone  (PACERONE ) 200 MG tablet, Take 1 tablet (200 mg total) by mouth daily., Disp: , Rfl:    cholecalciferol  (VITAMIN D ) 1000 units tablet, Take 1,000 Units by mouth daily., Disp: , Rfl:    dapagliflozin  propanediol (FARXIGA ) 10 MG TABS tablet, Take 1 tablet (10 mg total) by mouth daily before breakfast., Disp: 30 tablet, Rfl: 11   docusate sodium  (COLACE) 100 MG capsule, Take 1 capsule (100 mg total) by mouth 2 (two) times daily as needed for mild constipation., Disp: , Rfl:    dorzolamide -timolol  (COSOPT ) 22.3-6.8 MG/ML ophthalmic solution, Place 1 drop into both eyes 2 (two) times daily., Disp: , Rfl:    ELIQUIS  5 MG TABS tablet, Take 5 mg by mouth 2 (two) times daily., Disp: , Rfl:    feeding supplement (BOOST HIGH PROTEIN) LIQD, Take 1 Container by mouth daily., Disp: , Rfl:    feeding supplement (ENSURE PLUS HIGH PROTEIN) LIQD, Take 237 mLs by mouth 3 (three) times daily between meals., Disp: , Rfl:    finasteride  (PROSCAR ) 5 MG tablet, Take 5 mg by mouth daily., Disp: ,  Rfl:    furosemide  (LASIX ) 20 MG tablet, Take 1 tablet (20 mg total) by mouth daily., Disp: 90 tablet, Rfl: 3   levothyroxine  (SYNTHROID ) 88 MCG tablet, 88 mcg., Disp: , Rfl:    mirtazapine  (REMERON ) 7.5 MG tablet, Take 1 tablet (7.5 mg total) by mouth at bedtime., Disp: 30 tablet, Rfl: 0   Multiple Vitamin (MULTIVITAMIN WITH MINERALS) TABS tablet, Take 1 tablet by mouth daily., Disp: 30 tablet, Rfl: 0   Nutritional Supplements (,FEEDING SUPPLEMENT, PROSOURCE PLUS) liquid, Take 30 mLs by mouth 2 (two) times daily before lunch and supper., Disp: 887 mL, Rfl: 0   polyethylene glycol (MIRALAX  / GLYCOLAX ) 17 g packet, Take 17 g by mouth 2 (two) times daily as needed., Disp: , Rfl:    sildenafil (VIAGRA) 25 MG tablet, Take 25 mg by mouth as needed for erectile dysfunction., Disp: , Rfl:   Past Medical History: Past Medical History:  Diagnosis Date   Arthritis    oa   BPH (benign prostatic hyperplasia)    Cancer (HCC) 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   Cataracts, bilateral    Glaucoma    Hypertension    Vitamin D  deficiency     Tobacco Use: Social History   Tobacco Use  Smoking Status Former   Current packs/day: 0.00   Average packs/day: 1.5 packs/day for 10.0 years (15.0 ttl pk-yrs)   Types: Cigarettes   Start date: 64  Quit date: 34   Years since quitting: 55.8  Smokeless Tobacco Never    Labs: Review Flowsheet       Latest Ref Rng & Units 07/11/2023 03/23/2024  Labs for ITP Cardiac and Pulmonary Rehab  Bicarbonate 20.0 - 28.0 mmol/L 38.1  39.9  39.7   TCO2 22 - 32 mmol/L 40  42  42   O2 Saturation % 54  69  67     Details       Multiple values from one day are sorted in reverse-chronological order         Capillary Blood Glucose: Lab Results  Component Value Date   GLUCAP 147 (H) 07/12/2023   GLUCAP 98 11/27/2022     Pulmonary Assessment Scores:  Pulmonary Assessment Scores     Row Name 06/22/24 0920         ADL UCSD   ADL Phase Entry      SOB Score total 32       CAT Score   CAT Score 2       mMRC Score   mMRC Score 2       UCSD: Self-administered rating of dyspnea associated with activities of daily living (ADLs) 6-point scale (0 = not at all to 5 = maximal or unable to do because of breathlessness)  Scoring Scores range from 0 to 120.  Minimally important difference is 5 units  CAT: CAT can identify the health impairment of COPD patients and is better correlated with disease progression.  CAT has a scoring range of zero to 40. The CAT score is classified into four groups of low (less than 10), medium (10 - 20), high (21-30) and very high (31-40) based on the impact level of disease on health status. A CAT score over 10 suggests significant symptoms.  A worsening CAT score could be explained by an exacerbation, poor medication adherence, poor inhaler technique, or progression of COPD or comorbid conditions.  CAT MCID is 2 points  mMRC: mMRC (Modified Medical Research Council) Dyspnea Scale is used to assess the degree of baseline functional disability in patients of respiratory disease due to dyspnea. No minimal important difference is established. A decrease in score of 1 point or greater is considered a positive change.   Pulmonary Function Assessment:  Pulmonary Function Assessment - 06/22/24 0920       Breath   Shortness of Breath Yes;Limiting activity          Exercise Target Goals: Exercise Program Goal: Individual exercise prescription set using results from initial 6 min walk test and THRR while considering  patient's activity barriers and safety.   Exercise Prescription Goal: Initial exercise prescription builds to 30-45 minutes a day of aerobic activity, 2-3 days per week.  Home exercise guidelines will be given to patient during program as part of exercise prescription that the participant will acknowledge.  Activity Barriers & Risk Stratification:  Activity Barriers & Cardiac Risk  Stratification - 06/22/24 0935       Activity Barriers & Cardiac Risk Stratification   Activity Barriers Deconditioning;Balance Concerns;Muscular Weakness;Shortness of Breath;Assistive Device;Right Hip Replacement;Left Hip Replacement          6 Minute Walk:  6 Minute Walk     Row Name 06/22/24 1026         6 Minute Walk   Phase Initial     Distance 770 feet     Walk Time 6 minutes     # of Rest Breaks 0  MPH 1.46     METS 1.65     RPE 11     Perceived Dyspnea  1     VO2 Peak 5.76     Symptoms No     Resting HR 50 bpm     Resting BP 132/68     Resting Oxygen  Saturation  99 %     Exercise Oxygen  Saturation  during 6 min walk 97 %     Max Ex. HR 69 bpm     Max Ex. BP 150/60     2 Minute Post BP 128/66       Interval HR   1 Minute HR 64     2 Minute HR 65     3 Minute HR 66     4 Minute HR 69     5 Minute HR 69     6 Minute HR 69     2 Minute Post HR 66     Interval Heart Rate? Yes       Interval Oxygen    Interval Oxygen ? Yes     Baseline Oxygen  Saturation % 99 %     1 Minute Oxygen  Saturation % 100 %     1 Minute Liters of Oxygen  3 L     2 Minute Oxygen  Saturation % 99 %     2 Minute Liters of Oxygen  3 L     3 Minute Oxygen  Saturation % 98 %     3 Minute Liters of Oxygen  3 L     4 Minute Oxygen  Saturation % 99 %     4 Minute Liters of Oxygen  3 L     5 Minute Oxygen  Saturation % 97 %     5 Minute Liters of Oxygen  3 L     6 Minute Oxygen  Saturation % 97 %     6 Minute Liters of Oxygen  3 L     2 Minute Post Oxygen  Saturation % 97 %     2 Minute Post Liters of Oxygen  3 L        Oxygen  Initial Assessment:  Oxygen  Initial Assessment - 06/22/24 0919       Home Oxygen    Home Oxygen  Device Portable Concentrator;Home Concentrator    Sleep Oxygen  Prescription Continuous    Liters per minute 1    Home Exercise Oxygen  Prescription Pulsed    Liters per minute 3    Home Resting Oxygen  Prescription Pulsed    Liters per minute 2    Compliance with Home  Oxygen  Use Yes      Initial 6 min Walk   Oxygen  Used Continuous    Liters per minute 3      Program Oxygen  Prescription   Program Oxygen  Prescription Continuous    Liters per minute 3      Intervention   Short Term Goals To learn and exhibit compliance with exercise, home and travel O2 prescription;To learn and understand importance of maintaining oxygen  saturations>88%;To learn and demonstrate proper use of respiratory medications;To learn and understand importance of monitoring SPO2 with pulse oximeter and demonstrate accurate use of the pulse oximeter.;To learn and demonstrate proper pursed lip breathing techniques or other breathing techniques.     Long  Term Goals Exhibits compliance with exercise, home  and travel O2 prescription;Verbalizes importance of monitoring SPO2 with pulse oximeter and return demonstration;Maintenance of O2 saturations>88%;Exhibits proper breathing techniques, such as pursed lip breathing or other method taught during program session;Compliance with respiratory medication;Demonstrates proper use of  MDI's          Oxygen  Re-Evaluation:  Oxygen  Re-Evaluation     Row Name 07/03/24 1102 07/31/24 0923           Program Oxygen  Prescription   Program Oxygen  Prescription Continuous Continuous      Liters per minute 3 3        Home Oxygen    Home Oxygen  Device Portable Concentrator;Home Concentrator Portable Concentrator;Home Concentrator      Sleep Oxygen  Prescription Continuous Continuous      Liters per minute 1 1      Home Exercise Oxygen  Prescription Pulsed Pulsed      Liters per minute 3 3      Home Resting Oxygen  Prescription Pulsed Pulsed      Liters per minute 2 2      Compliance with Home Oxygen  Use Yes Yes        Goals/Expected Outcomes   Short Term Goals To learn and exhibit compliance with exercise, home and travel O2 prescription;To learn and understand importance of maintaining oxygen  saturations>88%;To learn and demonstrate proper use of  respiratory medications;To learn and understand importance of monitoring SPO2 with pulse oximeter and demonstrate accurate use of the pulse oximeter.;To learn and demonstrate proper pursed lip breathing techniques or other breathing techniques.  To learn and exhibit compliance with exercise, home and travel O2 prescription;To learn and understand importance of maintaining oxygen  saturations>88%;To learn and demonstrate proper use of respiratory medications;To learn and understand importance of monitoring SPO2 with pulse oximeter and demonstrate accurate use of the pulse oximeter.;To learn and demonstrate proper pursed lip breathing techniques or other breathing techniques.       Long  Term Goals Exhibits compliance with exercise, home  and travel O2 prescription;Verbalizes importance of monitoring SPO2 with pulse oximeter and return demonstration;Maintenance of O2 saturations>88%;Exhibits proper breathing techniques, such as pursed lip breathing or other method taught during program session;Compliance with respiratory medication;Demonstrates proper use of MDI's Exhibits compliance with exercise, home  and travel O2 prescription;Verbalizes importance of monitoring SPO2 with pulse oximeter and return demonstration;Maintenance of O2 saturations>88%;Exhibits proper breathing techniques, such as pursed lip breathing or other method taught during program session;Compliance with respiratory medication;Demonstrates proper use of MDI's      Goals/Expected Outcomes Compliance and understanding of oxygen  saturation monitoring and breathing techniques to decrease shortness of breath. Compliance and understanding of oxygen  saturation monitoring and breathing techniques to decrease shortness of breath.         Oxygen  Discharge (Final Oxygen  Re-Evaluation):  Oxygen  Re-Evaluation - 07/31/24 0923       Program Oxygen  Prescription   Program Oxygen  Prescription Continuous    Liters per minute 3      Home Oxygen    Home  Oxygen  Device Portable Concentrator;Home Concentrator    Sleep Oxygen  Prescription Continuous    Liters per minute 1    Home Exercise Oxygen  Prescription Pulsed    Liters per minute 3    Home Resting Oxygen  Prescription Pulsed    Liters per minute 2    Compliance with Home Oxygen  Use Yes      Goals/Expected Outcomes   Short Term Goals To learn and exhibit compliance with exercise, home and travel O2 prescription;To learn and understand importance of maintaining oxygen  saturations>88%;To learn and demonstrate proper use of respiratory medications;To learn and understand importance of monitoring SPO2 with pulse oximeter and demonstrate accurate use of the pulse oximeter.;To learn and demonstrate proper pursed lip breathing techniques or other breathing techniques.  Long  Term Goals Exhibits compliance with exercise, home  and travel O2 prescription;Verbalizes importance of monitoring SPO2 with pulse oximeter and return demonstration;Maintenance of O2 saturations>88%;Exhibits proper breathing techniques, such as pursed lip breathing or other method taught during program session;Compliance with respiratory medication;Demonstrates proper use of MDI's    Goals/Expected Outcomes Compliance and understanding of oxygen  saturation monitoring and breathing techniques to decrease shortness of breath.          Initial Exercise Prescription:  Initial Exercise Prescription - 06/22/24 1000       Date of Initial Exercise RX and Referring Provider   Date 06/22/24    Referring Provider [name]    Expected Discharge Date 09/17/24      Oxygen    Oxygen  Continuous    Liters 2    Maintain Oxygen  Saturation 88% or higher      NuStep   Level 1    SPM 58    Minutes 15    METs 1.43      Track   Minutes 15    METs 1.7      Prescription Details   Frequency (times per week) 2    Duration Progress to 30 minutes of continuous aerobic without signs/symptoms of physical distress      Intensity    THRR 40-80% of Max Heartrate 55-110    Ratings of Perceived Exertion 11-13    Perceived Dyspnea 0-4      Progression   Progression Continue to progress workloads to maintain intensity without signs/symptoms of physical distress.      Resistance Training   Training Prescription Yes    Weight red bands    Reps 10-15          Perform Capillary Blood Glucose checks as needed.  Exercise Prescription Changes:   Exercise Prescription Changes     Row Name 06/30/24 1100 07/14/24 1200 07/28/24 1100 08/11/24 1200       Response to Exercise   Blood Pressure (Admit) 128/58 102/60 146/68 110/60    Blood Pressure (Exercise) 146/78 148/68 150/80 146/70    Blood Pressure (Exit) 108/70 122/76 136/60 144/62    Heart Rate (Admit) 46 bpm 47 bpm 47 bpm 47 bpm    Heart Rate (Exercise) 54 bpm 65 bpm 64 bpm 62 bpm    Heart Rate (Exit) 48 bpm 56 bpm 56 bpm 56 bpm    Oxygen  Saturation (Admit) 98 % 99 %  2L 97 %  2L 98 %    Oxygen  Saturation (Exercise) 96 % 89 %  3L 86 %  2L 95 %    Oxygen  Saturation (Exit) 95 % 92 %  3L POC 95 %  3 POC 89 %    Rating of Perceived Exertion (Exercise) 12 13 11 13     Perceived Dyspnea (Exercise) 1 1 1 1     Duration Continue with 30 min of aerobic exercise without signs/symptoms of physical distress. Continue with 30 min of aerobic exercise without signs/symptoms of physical distress. Continue with 30 min of aerobic exercise without signs/symptoms of physical distress. Continue with 30 min of aerobic exercise without signs/symptoms of physical distress.    Intensity THRR unchanged THRR unchanged THRR unchanged THRR unchanged      Progression   Progression Continue to progress workloads to maintain intensity without signs/symptoms of physical distress. Continue to progress workloads to maintain intensity without signs/symptoms of physical distress. Continue to progress workloads to maintain intensity without signs/symptoms of physical distress. Continue to progress  workloads to maintain intensity  without signs/symptoms of physical distress.      Resistance Training   Training Prescription Yes Yes Yes Yes    Weight red bands red bands red bands red bands    Reps 10-15 10-15 10-15 10-15    Time 10 Minutes 10 Minutes 10 Minutes 10 Minutes      Oxygen    Oxygen  Continuous Continuous Continuous Continuous    Liters 2 2 2 3       NuStep   Level 2 2 3 3     SPM 69 75 77 78    Minutes 15 15 15 15     METs 2 2.2 2.2 2.2      Track   Laps 5 7 7 7     Minutes 15 15 15 15     METs 1.77 2.08 2.08 2.08      Oxygen    Maintain Oxygen  Saturation 88% or higher 88% or higher 88% or higher 88% or higher       Exercise Comments:   Exercise Goals and Review:   Exercise Goals     Row Name 06/22/24 0920             Exercise Goals   Increase Physical Activity Yes       Intervention Provide advice, education, support and counseling about physical activity/exercise needs.;Develop an individualized exercise prescription for aerobic and resistive training based on initial evaluation findings, risk stratification, comorbidities and participant's personal goals.       Expected Outcomes Short Term: Attend rehab on a regular basis to increase amount of physical activity.;Long Term: Add in home exercise to make exercise part of routine and to increase amount of physical activity.;Long Term: Exercising regularly at least 3-5 days a week.       Increase Strength and Stamina Yes       Intervention Provide advice, education, support and counseling about physical activity/exercise needs.;Develop an individualized exercise prescription for aerobic and resistive training based on initial evaluation findings, risk stratification, comorbidities and participant's personal goals.       Expected Outcomes Short Term: Increase workloads from initial exercise prescription for resistance, speed, and METs.;Short Term: Perform resistance training exercises routinely during rehab and add  in resistance training at home;Long Term: Improve cardiorespiratory fitness, muscular endurance and strength as measured by increased METs and functional capacity ( )       Able to understand and use rate of perceived exertion (RPE) scale Yes       Intervention Provide education and explanation on how to use RPE scale       Expected Outcomes Short Term: Able to use RPE daily in rehab to express subjective intensity level;Long Term:  Able to use RPE to guide intensity level when exercising independently       Able to understand and use Dyspnea scale Yes       Intervention Provide education and explanation on how to use Dyspnea scale       Expected Outcomes Short Term: Able to use Dyspnea scale daily in rehab to express subjective sense of shortness of breath during exertion;Long Term: Able to use Dyspnea scale to guide intensity level when exercising independently       Knowledge and understanding of Target Heart Rate Range (THRR) Yes       Intervention Provide education and explanation of THRR including how the numbers were predicted and where they are located for reference       Expected Outcomes Short Term: Able to state/look up THRR;Long Term: Able to  use THRR to govern intensity when exercising independently;Short Term: Able to use daily as guideline for intensity in rehab       Understanding of Exercise Prescription Yes       Intervention Provide education, explanation, and written materials on patient's individual exercise prescription       Expected Outcomes Short Term: Able to explain program exercise prescription;Long Term: Able to explain home exercise prescription to exercise independently          Exercise Goals Re-Evaluation :  Exercise Goals Re-Evaluation     Row Name 07/03/24 1102 07/31/24 0921           Exercise Goal Re-Evaluation   Exercise Goals Review Increase Physical Activity;Able to understand and use Dyspnea scale;Understanding of Exercise Prescription;Increase  Strength and Stamina;Knowledge and understanding of Target Heart Rate Range (THRR);Able to understand and use rate of perceived exertion (RPE) scale Increase Physical Activity;Able to understand and use Dyspnea scale;Understanding of Exercise Prescription;Increase Strength and Stamina;Knowledge and understanding of Target Heart Rate Range (THRR);Able to understand and use rate of perceived exertion (RPE) scale      Comments Lowen has completed 2 exercise sessions. He exercises for 15 min on the Nustep and track. He averages 2.3 METs at level 2 on the Nustep and 1.92 METs on the track. He performs the warmup and cooldown standing without limitations. It is too soon to notate any discernable progressions. Will continue to monitor and progress as able. Eleno has completed 10 exercise sessions. He exercises for 15 min on the Nustep and track. He averages 2.3 METs at level 3 on the Nustep and 1.92 METs on the track. He performs the warmup and cooldown standing without limitations. Habeeb has increased his level on the Nustep as METs are relatively the same. His track laps remain the same. Mikah is difficult to progress due to his deconditioned state. Will continue to monitor and progress as able.      Expected Outcomes Through exercise at rehab and home, the patient will decrease shortness of breath with daily activities and feel confident in carrying out an exercise regimen at home. Through exercise at rehab and home, the patient will decrease shortness of breath with daily activities and feel confident in carrying out an exercise regimen at home.         Discharge Exercise Prescription (Final Exercise Prescription Changes):  Exercise Prescription Changes - 08/11/24 1200       Response to Exercise   Blood Pressure (Admit) 110/60    Blood Pressure (Exercise) 146/70    Blood Pressure (Exit) 144/62    Heart Rate (Admit) 47 bpm    Heart Rate (Exercise) 62 bpm    Heart Rate (Exit) 56 bpm    Oxygen  Saturation  (Admit) 98 %    Oxygen  Saturation (Exercise) 95 %    Oxygen  Saturation (Exit) 89 %    Rating of Perceived Exertion (Exercise) 13    Perceived Dyspnea (Exercise) 1    Duration Continue with 30 min of aerobic exercise without signs/symptoms of physical distress.    Intensity THRR unchanged      Progression   Progression Continue to progress workloads to maintain intensity without signs/symptoms of physical distress.      Resistance Training   Training Prescription Yes    Weight red bands    Reps 10-15    Time 10 Minutes      Oxygen    Oxygen  Continuous    Liters 3      NuStep  Level 3    SPM 78    Minutes 15    METs 2.2      Track   Laps 7    Minutes 15    METs 2.08      Oxygen    Maintain Oxygen  Saturation 88% or higher          Nutrition:  Target Goals: Understanding of nutrition guidelines, daily intake of sodium 1500mg , cholesterol 200mg , calories 30% from fat and 7% or less from saturated fats, daily to have 5 or more servings of fruits and vegetables.  Biometrics:    Nutrition Therapy Plan and Nutrition Goals:   Nutrition Assessments:  MEDIFICTS Score Key: >=70 Need to make dietary changes  40-70 Heart Healthy Diet <= 40 Therapeutic Level Cholesterol Diet   Picture Your Plate Scores: <59 Unhealthy dietary pattern with much room for improvement. 41-50 Dietary pattern unlikely to meet recommendations for good health and room for improvement. 51-60 More healthful dietary pattern, with some room for improvement.  >60 Healthy dietary pattern, although there may be some specific behaviors that could be improved.    Nutrition Goals Re-Evaluation:   Nutrition Goals Discharge (Final Nutrition Goals Re-Evaluation):   Psychosocial: Target Goals: Acknowledge presence or absence of significant depression and/or stress, maximize coping skills, provide positive support system. Participant is able to verbalize types and ability to use techniques and skills  needed for reducing stress and depression.  Initial Review & Psychosocial Screening:  Initial Psych Review & Screening - 06/22/24 0931       Initial Review   Current issues with None Identified      Family Dynamics   Good Support System? Yes    Comments 2 daughters      Barriers   Psychosocial barriers to participate in program There are no identifiable barriers or psychosocial needs.      Screening Interventions   Interventions Encouraged to exercise          Quality of Life Scores:  Scores of 19 and below usually indicate a poorer quality of life in these areas.  A difference of  2-3 points is a clinically meaningful difference.  A difference of 2-3 points in the total score of the Quality of Life Index has been associated with significant improvement in overall quality of life, self-image, physical symptoms, and general health in studies assessing change in quality of life.  PHQ-9: Review Flowsheet       06/22/2024 11/30/2022  Depression screen PHQ 2/9  Decreased Interest 0 0  Down, Depressed, Hopeless 0 0  PHQ - 2 Score 0 0  Altered sleeping 0 -  Tired, decreased energy 1 -  Change in appetite 0 -  Feeling bad or failure about yourself  0 -  Trouble concentrating 0 -  Moving slowly or fidgety/restless 0 -  Suicidal thoughts 0 -  PHQ-9 Score 1 -  Difficult doing work/chores Somewhat difficult -   Interpretation of Total Score  Total Score Depression Severity:  1-4 = Minimal depression, 5-9 = Mild depression, 10-14 = Moderate depression, 15-19 = Moderately severe depression, 20-27 = Severe depression   Psychosocial Evaluation and Intervention:  Psychosocial Evaluation - 06/22/24 0932       Psychosocial Evaluation & Interventions   Interventions Encouraged to exercise with the program and follow exercise prescription    Comments Veron denies any psychosocial barriers at this time.    Expected Outcomes For Chaden to participate in rehab without psychosocial  barriers.    Continue  Psychosocial Services  No Follow up required          Psychosocial Re-Evaluation:  Psychosocial Re-Evaluation     Row Name 07/06/24 1034 08/03/24 1252           Psychosocial Re-Evaluation   Current issues with None Identified None Identified      Comments Monthly psychosocial re-evaluation as follows: Julio continues to decline any psy/soc barriers or concerns. He states he has great support from his wife and family. Monthly psychosocial re-evaluation as follows: Mckade continues to deny any psy/soc barriers or concerns.      Expected Outcomes For Stony to continue attending PR without any psy/soc barriers or concerns. For Encarnacion to continue attending PR without any psy/soc barriers or concerns.      Interventions Encouraged to attend Pulmonary Rehabilitation for the exercise Encouraged to attend Pulmonary Rehabilitation for the exercise      Continue Psychosocial Services  No Follow up required No Follow up required         Psychosocial Discharge (Final Psychosocial Re-Evaluation):  Psychosocial Re-Evaluation - 08/03/24 1252       Psychosocial Re-Evaluation   Current issues with None Identified    Comments Monthly psychosocial re-evaluation as follows: Hobert continues to deny any psy/soc barriers or concerns.    Expected Outcomes For Landynn to continue attending PR without any psy/soc barriers or concerns.    Interventions Encouraged to attend Pulmonary Rehabilitation for the exercise    Continue Psychosocial Services  No Follow up required          Education: Education Goals: Education classes will be provided on a weekly basis, covering required topics. Participant will state understanding/return demonstration of topics presented.  Learning Barriers/Preferences:  Learning Barriers/Preferences - 06/22/24 0933       Learning Barriers/Preferences   Learning Barriers None    Learning Preferences None          Education Topics: Know Your  Numbers Group instruction that is supported by a PowerPoint presentation. Instructor discusses importance of knowing and understanding resting, exercise, and post-exercise oxygen  saturation, heart rate, and blood pressure. Oxygen  saturation, heart rate, blood pressure, rating of perceived exertion, and dyspnea are reviewed along with a normal range for these values.  Flowsheet Row PULMONARY REHAB OTHER RESPIRATORY from 07/02/2024 in [facility], [facility]  Date 07/02/24  Educator EP  Instruction Review Code 1- Verbalizes Understanding    Exercise for the Pulmonary Patient Group instruction that is supported by a PowerPoint presentation. Instructor discusses benefits of exercise, core components of exercise, frequency, duration, and intensity of an exercise routine, importance of utilizing pulse oximetry during exercise, safety while exercising, and options of places to exercise outside of rehab.    MET Level  Group instruction provided by PowerPoint, verbal discussion, and written material to support subject matter. Instructor reviews what METs are and how to increase METs.    Pulmonary Medications Verbally interactive group education provided by instructor with focus on inhaled medications and proper administration.   Anatomy and Physiology of the Respiratory System Group instruction provided by PowerPoint, verbal discussion, and written material to support subject matter. Instructor reviews respiratory cycle and anatomical components of the respiratory system and their functions. Instructor also reviews differences in obstructive and restrictive respiratory diseases with examples of each.    Oxygen  Safety Group instruction provided by PowerPoint, verbal discussion, and written material to support subject matter. There is an overview of "What is Oxygen " and "Why  do we need it".  Instructor also reviews how to create a safe environment for  oxygen  use, the importance of using oxygen  as prescribed, and the risks of noncompliance. There is a brief discussion on traveling with oxygen  and resources the patient may utilize. Flowsheet Row PULMONARY REHAB OTHER RESPIRATORY from 07/09/2024 in [facility] for Heart, Vascular, & Lung Health  Date 07/09/24  Educator RN  Instruction Review Code 1- Verbalizes Understanding    Oxygen  Use Group instruction provided by PowerPoint, verbal discussion, and written material to discuss how supplemental oxygen  is prescribed and different types of oxygen  supply systems. Resources for more information are provided.  Flowsheet Row PULMONARY REHAB OTHER RESPIRATORY from 07/16/2024 in [facility] for [facility], Vascular, & Lung Health  Date 07/16/24  Educator RT  Instruction Review Code 1- Verbalizes Understanding    Breathing Techniques Group instruction that is supported by demonstration and informational handouts. Instructor discusses the benefits of pursed lip and diaphragmatic breathing and detailed demonstration on how to perform both.  Flowsheet Row PULMONARY REHAB OTHER RESPIRATORY from 07/23/2024 in [facility] for Heart, Vascular, & Lung Health  Date 07/23/24  Educator RN  Instruction Review Code 1- Verbalizes Understanding     Risk Factor Reduction Group instruction that is supported by a PowerPoint presentation. Instructor discusses the definition of a risk factor, different risk factors for pulmonary disease, and how the heart and lungs work together.   Pulmonary Diseases Group instruction provided by PowerPoint, verbal discussion, and written material to support subject matter. Instructor gives an overview of the different type of pulmonary diseases. There is also a discussion on risk factors and symptoms as well as ways to manage the diseases.   Stress and Energy Conservation Group instruction provided by PowerPoint,  verbal discussion, and written material to support subject matter. Instructor gives an overview of stress and the impact it can have on the body. Instructor also reviews ways to reduce stress. There is also a discussion on energy conservation and ways to conserve energy throughout the day. Flowsheet Row PULMONARY REHAB OTHER RESPIRATORY from 07/30/2024 in [facility] for Heart, Vascular, & Lung Health  Date 07/30/24  Educator RN  Instruction Review Code 1- Verbalizes Understanding    Warning Signs and Symptoms Group instruction provided by PowerPoint, verbal discussion, and written material to support subject matter. Instructor reviews warning signs and symptoms of stroke, heart attack, cold and flu. Instructor also reviews ways to prevent the spread of infection. Flowsheet Row PULMONARY REHAB OTHER RESPIRATORY from 08/06/2024 in [facility] for Heart, Vascular, & Lung Health  Date 08/06/24  Educator RN  Instruction Review Code 1- Verbalizes Understanding    Other Education Group or individual verbal, written, or video instructions that support the educational goals of the pulmonary rehab program.    Knowledge Questionnaire Score:  Knowledge Questionnaire Score - 06/22/24 0923       Knowledge Questionnaire Score   Pre Score 12/18          Core Components/Risk Factors/Patient Goals at Admission:  Personal Goals and Risk Factors at Admission - 06/22/24 0924       Core Components/Risk Factors/Patient Goals on Admission   Improve shortness of breath with ADL's Yes    Intervention Provide education, individualized exercise plan and daily activity instruction to help decrease symptoms of SOB with activities of daily living.    Expected Outcomes Short Term: Improve cardiorespiratory fitness to  achieve a reduction of symptoms when performing ADLs;Long Term: Be able to perform more ADLs without symptoms or delay the onset of symptoms     Increase knowledge of respiratory medications and ability to use respiratory devices properly  Yes    Intervention Provide education and demonstration as needed of appropriate use of medications, inhalers, and oxygen  therapy.    Expected Outcomes Short Term: Achieves understanding of medications use. Understands that oxygen  is a medication prescribed by physician. Demonstrates appropriate use of inhaler and oxygen  therapy.;Long Term: Maintain appropriate use of medications, inhalers, and oxygen  therapy.          Core Components/Risk Factors/Patient Goals Review:   Goals and Risk Factor Review     Row Name 07/06/24 1035 08/03/24 1253           Core Components/Risk Factors/Patient Goals Review   Personal Goals Review Improve shortness of breath with ADL's;Develop more efficient breathing techniques such as purse lipped breathing and diaphragmatic breathing and practicing self-pacing with activity.;Increase knowledge of respiratory medications and ability to use respiratory devices properly. Improve shortness of breath with ADL's      Review Monthly review of patient's Core Components/Risk Factors/Patient Goals are as follows: Goal in progress for improving his shortness of breath with ADLs. Timothy is working hard building up his strength and stamina. He is able to maintain his oxygen  saturation on 2L [place]. He is currently exercising on the [place] and walking the track. Goal met for developing more efficient breathing techniques such as purse lipped breathing and diaphragmatic breathing; and practicing self-pacing with activity. Onix can perform purse lipped breathing while short of breath. He demonstrated this while performing the warmup and exercising. He can initiate PLB on his own. He works on diaphragmatic breathing at home. Goal met for increasing his knowledge of respiratory medications and the ability to use respiratory devices properly. Our respiratory therapist has worked with Norleen and reviewed  medication names, usage, directions, and side effects. All questions were answered, and he understands without assistance. Shannan will continue to benefit from the PR program for exercise and education. Monthly review of patient's Core Components/Risk Factors/Patient Goals are as follows: Goal in progress for improving his shortness of breath with ADLs. Quillan is working hard building up his strength and stamina. He is able to maintain his oxygen  saturation on 2-3L [place]. He is currently exercising on the [place] and walking the track. Damaria will continue to benefit from the PR program for exercise and education.      Expected Outcomes Pt will show progress toward meeting expected goals and outcomes. Pt will show progress toward meeting expected goals and outcomes.         Core Components/Risk Factors/Patient Goals at Discharge (Final Review):   Goals and Risk Factor Review - 08/03/24 1253       Core Components/Risk Factors/Patient Goals Review   Personal Goals Review Improve shortness of breath with ADL's    Review Monthly review of patient's Core Components/Risk Factors/Patient Goals are as follows: Goal in progress for improving his shortness of breath with ADLs. Joeph is working hard building up his strength and stamina. He is able to maintain his oxygen  saturation on 2-3L [place]. He is currently exercising on the [place] and walking the track. Mansour will continue to benefit from the PR program for exercise and education.    Expected Outcomes Pt will show progress toward meeting expected goals and outcomes.          ITP Comments:  Comments: Pt is making expected progress toward Pulmonary Rehab goals after completing 12 session(s). Recommend continued exercise, life style modification, education, and utilization of breathing techniques to increase stamina and strength, while also decreasing shortness of breath with exertion.  Dr. Slater Staff is Medical Director for Pulmonary Rehab at [facility].

## 2024-08-13 ENCOUNTER — Encounter (HOSPITAL_COMMUNITY)
Admission: RE | Admit: 2024-08-13 | Discharge: 2024-08-13 | Disposition: A | Discharge status: Home / Self Care | Source: Ambulatory Visit | Attending: Pulmonary Disease | Admitting: Pulmonary Disease

## 2024-08-13 DIAGNOSIS — J849 Interstitial pulmonary disease, unspecified: Secondary | ICD-10-CM

## 2024-08-13 NOTE — Progress Notes (Signed)
 Daily Session Note  Patient Details  Name: Samuel Porter MRN: [id number] Date of Birth: Feb 26, 1941 Referring Provider:   [name] Pulmonary Rehab Walk Test from 06/22/2024 in [facility], [facility], & [facility]  Referring Provider [name]    Encounter Date: 08/13/2024  Check In:  Session Check In - 08/13/24 1123       Check-In   Supervising physician immediately available to respond to emergencies [facility] MD immediately available    Physician(s) Rosaline Skains, NP    Location [facility]    Staff Present Ronal Levin, RN, BSN;Cheryllynn Sarff Claudene, Neita Moats, MS, ACSM-CEP, Exercise Physiologist;Randi Midge HECKLE, ACSM-CEP, Exercise Physiologist    Virtual Visit No    Medication changes reported     No    Fall or balance concerns reported    No    Tobacco Cessation No Change    Warm-up and Cool-down Performed as group-led instruction    Resistance Training Performed Yes    VAD Patient? No    PAD/SET Patient? No      Pain Assessment   Currently in Pain? No/denies    Multiple Pain Sites No          Capillary Blood Glucose: No results found for this or any previous visit (from the past 24 hours).    Social History   Tobacco Use  Smoking Status Former   Current packs/day: 0.00   Average packs/day: 1.5 packs/day for 10.0 years (15.0 ttl pk-yrs)   Types: Cigarettes   Start date: 47   Quit date: 1970   Years since quitting: 55.8  Smokeless Tobacco Never    Goals Met:  Proper associated with RPD/PD & O2 Sat Independence with exercise equipment Exercise tolerated well No report of concerns or symptoms today Strength training completed today  Goals Unmet:  Not Applicable  Comments: Service time is from 1012 to 1150.    Dr. Slater Staff is Medical Director for Pulmonary Rehab at [facility].

## 2024-08-18 ENCOUNTER — Encounter (HOSPITAL_COMMUNITY)
Admission: RE | Admit: 2024-08-18 | Discharge: 2024-08-18 | Disposition: A | Discharge status: Home / Self Care | Source: Ambulatory Visit | Attending: Pulmonary Disease | Admitting: Pulmonary Disease

## 2024-08-18 DIAGNOSIS — J849 Interstitial pulmonary disease, unspecified: Secondary | ICD-10-CM | POA: Diagnosis present

## 2024-08-18 NOTE — Progress Notes (Signed)
 Daily Session Note  Patient Details  Name: Samuel Porter MRN: [id number] Date of Birth: 1941/02/03 Referring Provider:   [name] Pulmonary Rehab Walk Test from 06/22/2024 in [facility] for Heart, Vascular, & Lung Health  Referring Provider [name]    Encounter Date: 08/18/2024  Check In:  Session Check In - 08/18/24 1132       Check-In   Supervising physician immediately available to respond to emergencies [facility] MD immediately available    Physician(s) Rosaline Skains, NP    Location [facility]    Staff Present Ronal Levin, RN, BSN;Casey Claudene, Neita Moats, MS, ACSM-CEP, Exercise Physiologist;Neala Miggins Midge HECKLE, ACSM-CEP, Exercise Physiologist    Virtual Visit No    Medication changes reported     No    Fall or balance concerns reported    No    Tobacco Cessation No Change    Warm-up and Cool-down Performed as group-led instruction    Resistance Training Performed Yes    VAD Patient? No    PAD/SET Patient? No      Pain Assessment   Currently in Pain? No/denies          Capillary Blood Glucose: No results found for this or any previous visit (from the past 24 hours).    Social History   Tobacco Use  Smoking Status Former   Current packs/day: 0.00   Average packs/day: 1.5 packs/day for 10.0 years (15.0 ttl pk-yrs)   Types: Cigarettes   Start date: 44   Quit date: 1970   Years since quitting: 55.8  Smokeless Tobacco Never    Goals Met:  Exercise tolerated well No report of concerns or symptoms today Strength training completed today  Goals Unmet:  Not Applicable  Comments: Service time is from 1010 to 1140.    Dr. Slater Staff is Medical Director for Pulmonary Rehab at [facility].

## 2024-08-19 ENCOUNTER — Ambulatory Visit (HOSPITAL_COMMUNITY)
Admission: RE | Admit: 2024-08-19 | Discharge: 2024-08-19 | Disposition: A | Discharge status: Home / Self Care | Source: Ambulatory Visit | Attending: Physician Assistant | Admitting: Physician Assistant

## 2024-08-19 VITALS — BP 142/62 | HR 50 | Ht 73.0 in | Wt 157.4 lb

## 2024-08-19 DIAGNOSIS — D6869 Other thrombophilia: Secondary | ICD-10-CM

## 2024-08-19 DIAGNOSIS — I4891 Unspecified atrial fibrillation: Secondary | ICD-10-CM

## 2024-08-19 DIAGNOSIS — I4819 Other persistent atrial fibrillation: Secondary | ICD-10-CM

## 2024-08-19 DIAGNOSIS — Z79899 Other long term (current) drug therapy: Secondary | ICD-10-CM | POA: Diagnosis not present

## 2024-08-19 DIAGNOSIS — Z5181 Encounter for therapeutic drug level monitoring: Secondary | ICD-10-CM

## 2024-08-19 NOTE — Progress Notes (Signed)
 Primary Care Physician: Kip Righter, MD Primary Cardiologist: Ozell Fell, MD Electrophysiologist: Will Gladis Norton, MD  [facility]: Dr Zenaida Referring Physician: Dr Fell Norleen JINNY Rudell is a 83 y.o. 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  The patient was initially diagnosed with atrial fibrillation 11/22/23. Patient was started on Eliquis  for stroke prevention. He was sent for an echocardiogram on 12/19/23 and presented in afib with RVR. Patient was unaware of his arrhythmia. He was seen by Dr Fell and was started on carvedilol . Echo showed EF 35-40%, severe LAE, no significant valvular issues. He was set up for DCCV on 3/12 but presented in SR.   Patient was discharged 03/27/24 after an extended hospitalization for acute respiratory failure due to acute CHF and recurrent right pleural effusion. Seen by pulmonary and felt to have underlying ILD as well. He underwent DCCV on 03/24/24. He was back in afib about one week post discharge. His amiodarone  was increased to BID and had another DCCV on 04/24/24.   Patient returns for follow up for atrial fibrillation and amiodarone  monitoring. He is in SR today and feels reasonably well. He has fatigue with exertion but no other symptoms. No bleeding issues on anticoagulation.   Today, he  denies symptoms of palpitations, chest pain, orthopnea, PND, lower extremity edema, dizziness, presyncope, syncope, snoring, daytime somnolence, bleeding, or neurologic sequela. The patient is tolerating medications without difficulties and is otherwise without complaint today.    Atrial Fibrillation Risk Factors:  he does not have symptoms or diagnosis of sleep apnea. he does not have a history of rheumatic fever. he does have a history of alcohol use.  The patient does have a history of early familial atrial  fibrillation or other arrhythmias. Mother had afib.   Atrial Fibrillation Management history:  Previous antiarrhythmic drugs: amiodarone   Previous cardioversions: 03/05/24, 03/24/24 Previous ablations: none Anticoagulation history: Eliquis   ROS- All systems are reviewed and negative except as per the HPI above.  Past Medical History:  Diagnosis Date   Arthritis    oa   BPH (benign prostatic hyperplasia)    Cancer (HCC) 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   Cataracts, bilateral    Glaucoma    Hypertension    Vitamin D  deficiency     Current Outpatient Medications  Medication Sig Dispense Refill   acetaminophen  (TYLENOL ) 325 MG tablet Take 650 mg by mouth daily as needed for moderate pain (pain score 4-6) or mild pain (pain score 1-3). (Patient taking differently: Take 650 mg by mouth as needed for moderate pain (pain score 4-6) or mild pain (pain score 1-3).)     albuterol  (VENTOLIN  HFA) 108 (90 Base) MCG/ACT inhaler Inhale 2 puffs into the lungs every 4 (four) hours as needed for shortness of breath.     amiodarone  (PACERONE ) 200 MG tablet Take 1 tablet (200 mg total) by mouth daily.     cholecalciferol  (VITAMIN D ) 1000 units tablet Take 1,000 Units by mouth daily.     dapagliflozin  propanediol (FARXIGA ) 10 MG TABS tablet Take 1 tablet (10 mg total) by mouth daily before breakfast. 30 tablet 11   docusate sodium  (COLACE) 100 MG capsule Take 1 capsule (100 mg total) by mouth 2 (two) times daily as needed for mild constipation.     dorzolamide -timolol  (COSOPT ) 22.3-6.8 MG/ML ophthalmic solution Place 1 drop  into both eyes 2 (two) times daily.     ELIQUIS  5 MG TABS tablet Take 5 mg by mouth 2 (two) times daily.     feeding supplement (BOOST HIGH PROTEIN) LIQD Take 1 Container by mouth daily.     feeding supplement (ENSURE PLUS HIGH PROTEIN) LIQD Take 237 mLs by mouth 3 (three) times daily between meals.     finasteride  (PROSCAR ) 5 MG tablet Take 5 mg by mouth daily.      furosemide  (LASIX ) 20 MG tablet Take 1 tablet (20 mg total) by mouth daily. 90 tablet 3   levothyroxine  (SYNTHROID ) 88 MCG tablet 88 mcg.     mirtazapine  (REMERON ) 7.5 MG tablet Take 1 tablet (7.5 mg total) by mouth at bedtime. 30 tablet 0   Multiple Vitamin (MULTIVITAMIN WITH MINERALS) TABS tablet Take 1 tablet by mouth daily. 30 tablet 0   Nutritional Supplements (,FEEDING SUPPLEMENT, PROSOURCE PLUS) liquid Take 30 mLs by mouth 2 (two) times daily before lunch and supper. 887 mL 0   polyethylene glycol (MIRALAX  / GLYCOLAX ) 17 g packet Take 17 g by mouth 2 (two) times daily as needed.     sildenafil (VIAGRA) 25 MG tablet Take 25 mg by mouth as needed for erectile dysfunction.     No current facility-administered medications for this encounter.    Physical Exam: BP (!) 142/62   Pulse (!) 50   Ht 6' 1 (1.854 m)   Wt 71.4 kg   BMI 20.77 kg/m   GEN: Well nourished, well developed in no acute distress CARDIAC: Regular rate and rhythm, no murmurs, rubs, gallops RESPIRATORY:  Clear to auscultation without rales, wheezing or rhonchi  ABDOMEN: Soft, non-tender, non-distended EXTREMITIES:  No edema; No deformity    Wt Readings from Last 3 Encounters:  08/19/24 71.4 kg  08/11/24 70.7 kg  08/07/24 70.8 kg     EKG today demonstrates  SB Vent. rate 50 BPM PR interval 190 ms QRS duration 96 ms QT/QTcB 486/443 ms   Echo 05/28/24 demonstrated   1. Left ventricular ejection fraction, by estimation, is 60 to 65%. The  left ventricle has normal function. The left ventricle has no regional  wall motion abnormalities. Left ventricular diastolic parameters are  consistent with Grade II diastolic dysfunction (pseudonormalization).   2. Right ventricular systolic function is moderately reduced. The right  ventricular size is moderately enlarged. There is moderately elevated  pulmonary artery systolic pressure. The estimated right ventricular  systolic pressure is 59.1 mmHg.   3. Left atrial  size was severely dilated.   4. Right atrial size was severely dilated.   5. The mitral valve is normal in structure. No evidence of mitral valve  regurgitation. No evidence of mitral stenosis.   6. Tricuspid valve regurgitation is mild to moderate. Severe tricuspid  stenosis.   7. The aortic valve is tricuspid. There is mild calcification of the  aortic valve. Aortic valve regurgitation is not visualized. Aortic valve  sclerosis/calcification is present, without any evidence of aortic  stenosis.   8. Aortic dilatation noted. There is borderline dilatation of the  ascending aorta, measuring 38 mm.   9. The inferior vena cava is dilated in size with <50% respiratory  variability, suggesting right atrial pressure of 15 mmHg.    CHA2DS2-VASc Score = 4  The patient's score is based upon: CHF History: 1 HTN History: 1 Diabetes History: 0 Stroke History: 0 Vascular Disease History: 0 Age Score: 2 Gender Score: 0  ASSESSMENT AND PLAN: Persistent Atrial Fibrillation (ICD10:  I48.19) The patient's CHA2DS2-VASc score is 4, indicating a 4.8% annual risk of stroke.   Patient appears to be maintaining SR.  Continue amiodarone  200 mg daily. This is not ideal with his lung issues but he is not a candidate for ablation or for dofetilide admission per Dr Inocencio.  Continue Eliquis  5 mg BID  Secondary Hypercoagulable State (ICD10:  D68.69) The patient is at significant risk for stroke/thromboembolism based upon his CHA2DS2-VASc Score of 4.  Continue Apixaban  (Eliquis ). No bleeding issues.   High Risk Medication Monitoring (ICD 10: J342684) Patient requires ongoing monitoring for anti-arrhythmic medication which has the potential to cause life threatening arrhythmias. Intervals on ECG acceptable for amiodarone  monitoring. Check cmet today. Thyroid  function followed by endocrinology.   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    Daril Kicks PA-C Afib Clinic [facility] [address] [place], [place] [address] [phone]

## 2024-08-20 ENCOUNTER — Ambulatory Visit (HOSPITAL_COMMUNITY): Payer: Self-pay | Admitting: Physician Assistant

## 2024-08-20 ENCOUNTER — Encounter (HOSPITAL_COMMUNITY)
Admission: RE | Admit: 2024-08-20 | Discharge: 2024-08-20 | Disposition: A | Source: Ambulatory Visit | Attending: Pulmonary Disease | Admitting: Pulmonary Disease

## 2024-08-20 DIAGNOSIS — J849 Interstitial pulmonary disease, unspecified: Secondary | ICD-10-CM | POA: Diagnosis not present

## 2024-08-20 LAB — COMPREHENSIVE METABOLIC PANEL WITH GFR
ALT: 14 IU/L (ref 0–44)
AST: 20 IU/L (ref 0–40)
Albumin: 3.1 g/dL — ABNORMAL LOW (ref 3.7–4.7)
Alkaline Phosphatase: 92 IU/L (ref 48–129)
BUN/Creatinine Ratio: 19 (ref 10–24)
BUN: 23 mg/dL (ref 8–27)
Bilirubin Total: 0.7 mg/dL (ref 0.0–1.2)
CO2: 26 mmol/L (ref 20–29)
Calcium: 8.8 mg/dL (ref 8.6–10.2)
Chloride: 100 mmol/L (ref 96–106)
Creatinine, Ser: 1.21 mg/dL (ref 0.76–1.27)
Globulin, Total: 3.7 g/dL (ref 1.5–4.5)
Glucose: 120 mg/dL — ABNORMAL HIGH (ref 70–99)
Potassium: 4.2 mmol/L (ref 3.5–5.2)
Sodium: 139 mmol/L (ref 134–144)
Total Protein: 6.8 g/dL (ref 6.0–8.5)
eGFR: 59 mL/min/1.73 — ABNORMAL LOW (ref >59)

## 2024-08-20 NOTE — Progress Notes (Signed)
 Daily Session Note  Patient Details  Name: Samuel Porter MRN: [id number] Date of Birth: [birth year] Referring Provider:   [name] Pulmonary Rehab Walk Test from 06/22/2024 in [facility], Vascular[facility]  Referring Provider [name]    Encounter Date: 08/20/2024  Check In:  Session Check In - 08/20/24 1153       Check-In   Supervising physician immediately available to respond to emergencies [facility] MD immediately available    Physician(s) Orren Fabry, NP    Location [facility]    Staff Present Augustin Sharps, Neita Moats, MS, ACSM-CEP, Exercise Physiologist;Carlette Bernett, RN, Mallory Parkins, MS, ACSM-CEP, CCRP, Exercise Physiologist;Maria Cyrus, RN, BSN;Johnny Porter, MS, Exercise Physiologist    Virtual Visit No    Medication changes reported     No    Fall or balance concerns reported    No    Tobacco Cessation No Change    Warm-up and Cool-down Performed as group-led instruction    Resistance Training Performed Yes    VAD Patient? No    PAD/SET Patient? No      Pain Assessment   Currently in Pain? No/denies    Multiple Pain Sites No          Capillary Blood Glucose: No results found for this or any previous visit (from the past 24 hours).    Social History   Tobacco Use  Smoking Status Former   Current packs/day: 0.00   Average packs/day: 1.5 packs/day for 10.0 years (15.0 ttl pk-yrs)   Types: Cigarettes   Start date: 68   Quit date: 1970   Years since quitting: 55.8  Smokeless Tobacco Never    Goals Met:  Proper associated with RPD/PD & O2 Sat Independence with exercise equipment Exercise tolerated well No report of concerns or symptoms today Strength training completed today  Goals Unmet:  Not Applicable  Comments: Service time is from 1012 to 1144.    Dr. Slater Staff is Medical Director for Pulmonary Rehab at [facility].

## 2024-08-25 ENCOUNTER — Encounter (HOSPITAL_COMMUNITY)
Admission: RE | Admit: 2024-08-25 | Discharge: 2024-08-25 | Disposition: A | Source: Ambulatory Visit | Attending: Pulmonary Disease | Admitting: Pulmonary Disease

## 2024-08-25 VITALS — Wt 154.1 lb

## 2024-08-25 DIAGNOSIS — J849 Interstitial pulmonary disease, unspecified: Secondary | ICD-10-CM

## 2024-08-25 NOTE — Progress Notes (Addendum)
 Home Exercise Prescription I have reviewed a Home Exercise Prescription with Samuel Porter. Samuel Porter is currently exercising at home. He stated he has access to an exercise maching very similar to the Nustep. I encouraged him to continue exercising on the Nustep at home. He also stated he exercises for 10 min/day. I recommended increasing his time to 2x15 min/day. He agreed with my recommendations. Samuel Porter seems motivated to exercise at home. The patient stated that their goals were to improve lung function. We reviewed exercise guidelines, target heart rate during exercise, RPE Scale, weather conditions, endpoints for exercise, warmup and cool down. The patient is encouraged to come to me with any questions. I will continue to follow up with the patient to assist them with progression and safety. Spent 15 min with patient discussing home exercise plan and goals  Samuel JINNY Moats, MS, ACSM-CEP 08/25/2024 3:25 PM

## 2024-08-25 NOTE — Progress Notes (Signed)
 Daily Session Note  Patient Details  Name: VIDUR KNUST MRN: [id number] Date of Birth: 1941/10/02 Referring Provider:   [name] Pulmonary Rehab Walk Test from 06/22/2024 in [facility] for [facility], Vascular, & Lung Health  Referring Provider [name]    Encounter Date: 08/25/2024  Check In:  Session Check In - 08/25/24 1110       Check-In   Supervising physician immediately available to respond to emergencies [facility] MD immediately available    Physician(s) Barnie Press, NP    Location [facility]    Staff Present Augustin Sharps, Neita Moats, MS, ACSM-CEP, Exercise Physiologist;Johnny Fayette, MS, Exercise Physiologist;Mary Harvy, RN, BSN;Randi Reeve BS, ACSM-CEP, Exercise Physiologist    Virtual Visit No    Medication changes reported     No    Fall or balance concerns reported    No    Tobacco Cessation No Change    Warm-up and Cool-down Performed as group-led instruction    Resistance Training Performed Yes    VAD Patient? No    PAD/SET Patient? No      Pain Assessment   Currently in Pain? No/denies    Multiple Pain Sites No          Capillary Blood Glucose: No results found for this or any previous visit (from the past 24 hours).   Exercise Prescription Changes - 08/25/24 1200       Response to Exercise   Blood Pressure (Admit) 138/64    Blood Pressure (Exercise) 130/70    Blood Pressure (Exit) 126/68    Heart Rate (Admit) 45 bpm    Heart Rate (Exercise) 60 bpm    Heart Rate (Exit) 51 bpm    Oxygen  Saturation (Admit) 98 %    Oxygen  Saturation (Exercise) 90 %    Oxygen  Saturation (Exit) 96 %    Rating of Perceived Exertion (Exercise) 11    Perceived Dyspnea (Exercise) 1    Duration Continue with 30 min of aerobic exercise without signs/symptoms of physical distress.    Intensity THRR unchanged      Progression   Progression Continue to progress workloads to maintain intensity without signs/symptoms of  physical distress.      Resistance Training   Training Prescription Yes    Weight red bands    Reps 10-15    Time 10 Minutes      Oxygen    Oxygen  Continuous    Liters 3-4      NuStep   Level 4    SPM 68    Minutes 15    METs 3.2      Track   Laps 6    Minutes 15    METs 1.92      Oxygen    Maintain Oxygen  Saturation 88% or higher          Social History   Tobacco Use  Smoking Status Former   Current packs/day: 0.00   Average packs/day: 1.5 packs/day for 10.0 years (15.0 ttl pk-yrs)   Types: Cigarettes   Start date: 48   Quit date: 70   Years since quitting: 55.8  Smokeless Tobacco Never    Goals Met:  Proper associated with RPD/PD & O2 Sat Independence with exercise equipment Exercise tolerated well No report of concerns or symptoms today Strength training completed today  Goals Unmet:  Not Applicable  Comments: Service time is from 1011 to 1134.    Dr. Slater Staff is Medical Director for Pulmonary Rehab at  [facility].

## 2024-08-27 ENCOUNTER — Encounter (HOSPITAL_COMMUNITY)
Admission: RE | Admit: 2024-08-27 | Discharge: 2024-08-27 | Disposition: A | Source: Ambulatory Visit | Attending: Pulmonary Disease | Admitting: Pulmonary Disease

## 2024-08-27 DIAGNOSIS — J849 Interstitial pulmonary disease, unspecified: Secondary | ICD-10-CM

## 2024-08-27 DIAGNOSIS — I5020 Unspecified systolic (congestive) heart failure: Secondary | ICD-10-CM | POA: Diagnosis not present

## 2024-08-27 NOTE — Progress Notes (Signed)
 Daily Session Note  Patient Details  Name: Samuel Porter MRN: [id number] Date of Birth: December 26, 1940 Referring Provider:   Conrad Porter Pulmonary Rehab Walk Test from 06/22/2024 in [facility] for Heart, Vascular, & Lung Health  Referring Provider Samuel Porter    Encounter Date: 08/27/2024  Check In:  Session Check In - 08/27/24 1138       Check-In   Supervising physician immediately available to respond to emergencies [facility] MD immediately available    Physician(s) Samuel Beauvais, NP    [facility]    Staff Present Samuel Porter, Samuel Moats, MS, ACSM-CEP, Exercise Physiologist;Samuel Harvy, RN, BSN;Samuel Porter BS, ACSM-CEP, Exercise Physiologist    Virtual Visit No    Medication changes reported     No    Fall or balance concerns reported    No    Tobacco Cessation No Change    Warm-up and Cool-down Performed as group-led instruction    Resistance Training Performed Yes    VAD Patient? No    PAD/SET Patient? No      Pain Assessment   Currently in Pain? No/denies    Multiple Pain Sites No          Capillary Blood Glucose: No results found for this or any previous visit (from the past 24 hours).    Social History   Tobacco Use  Smoking Status Former   Current packs/day: 0.00   Average packs/day: 1.5 packs/day for 10.0 years (15.0 ttl pk-yrs)   Types: Cigarettes   Start date: 35   Quit date: 1970   Years since quitting: 55.9  Smokeless Tobacco Never    Goals Met:  Proper associated with RPD/PD & O2 Sat Exercise tolerated well No report of concerns or symptoms today Strength training completed today  Goals Unmet:  Not Applicable  Comments: Service time is from 1004 to 1147.    Samuel Porter Staff is Medical Director for Pulmonary Rehab at [facility].

## 2024-09-01 ENCOUNTER — Ambulatory Visit: Attending: Internal Medicine | Admitting: Internal Medicine

## 2024-09-01 ENCOUNTER — Ambulatory Visit

## 2024-09-01 ENCOUNTER — Encounter (HOSPITAL_COMMUNITY)
Admission: RE | Admit: 2024-09-01 | Discharge: 2024-09-01 | Disposition: A | Discharge status: Home / Self Care | Source: Ambulatory Visit | Attending: Pulmonary Disease | Admitting: Pulmonary Disease

## 2024-09-01 ENCOUNTER — Encounter: Payer: Self-pay | Admitting: Internal Medicine

## 2024-09-01 VITALS — BP 136/48 | HR 47 | Temp 97.5°F | Resp 19 | Ht 73.0 in | Wt 155.0 lb

## 2024-09-01 DIAGNOSIS — C4492 Squamous cell carcinoma of skin, unspecified: Secondary | ICD-10-CM | POA: Diagnosis not present

## 2024-09-01 DIAGNOSIS — R7689 Other specified abnormal immunological findings in serum: Secondary | ICD-10-CM | POA: Diagnosis not present

## 2024-09-01 DIAGNOSIS — I5043 Acute on chronic combined systolic (congestive) and diastolic (congestive) heart failure: Secondary | ICD-10-CM

## 2024-09-01 DIAGNOSIS — J849 Interstitial pulmonary disease, unspecified: Secondary | ICD-10-CM

## 2024-09-01 DIAGNOSIS — M1990 Unspecified osteoarthritis, unspecified site: Secondary | ICD-10-CM | POA: Diagnosis not present

## 2024-09-01 DIAGNOSIS — J90 Pleural effusion, not elsewhere classified: Secondary | ICD-10-CM

## 2024-09-01 DIAGNOSIS — R682 Dry mouth, unspecified: Secondary | ICD-10-CM

## 2024-09-01 NOTE — Progress Notes (Signed)
 Daily Session Note  Patient Details  Name: Samuel Porter MRN: [id number] Date of Birth: 12-May-1941 Referring Provider:   [name] Pulmonary Rehab Walk Test from 06/22/2024 in [facility] for Heart, Vascular, & Lung Health  Referring Provider [name]    Encounter Date: 09/01/2024  Check In:  Session Check In - 09/01/24 1129       Check-In   Supervising physician immediately available to respond to emergencies [facility] MD immediately available    Physician(s) Josefa Beauvais, NP    [facility]    Staff Present Augustin Sharps, Neita Moats, MS, ACSM-CEP, Exercise Physiologist;Anai Lipson Harvy, RN, BSN;Randi Midge BS, ACSM-CEP, Exercise Physiologist;Maria Whitaker, RN, BSN    Virtual Visit No    Medication changes reported     No    Fall or balance concerns reported    No    Tobacco Cessation No Change    Warm-up and Cool-down Performed as group-led Writer Performed Yes    VAD Patient? No    PAD/SET Patient? No      Pain Assessment   Currently in Pain? No/denies    Multiple Pain Sites No          Capillary Blood Glucose: No results found for this or any previous visit (from the past 24 hours).    Social History   Tobacco Use  Smoking Status Former   Current packs/day: 0.00   Average packs/day: 1.5 packs/day for 10.0 years (15.0 ttl pk-yrs)   Types: Cigarettes   Start date: 48   Quit date: 1970   Years since quitting: 55.9   Passive exposure: Never  Smokeless Tobacco Never    Goals Met:  Independence with exercise equipment Exercise tolerated well Queuing for purse lip breathing No report of concerns or symptoms today Strength training completed today  Goals Unmet:  Not Applicable  Comments: Service time is from 1035 to 1141    Dr. Slater Staff is Medical Director for Pulmonary Rehab at [facility].

## 2024-09-01 NOTE — Progress Notes (Signed)
 Office Visit Note  Patient: Samuel Porter             Date of Birth: 18-Jul-1941           MRN: [id number]             PCP: Kip Righter, MD Referring: Sherlon Brayton RAMAN, MD Visit Date: 09/01/2024 Occupation: Data Unavailable  Subjective:  New Patient (Initial Visit) (Referral was after a hospital stay where he believes they found some arthritis but not sure.) and Abnormal Lab   Discussed the use of AI scribe software for clinical note transcription with the patient, who gave verbal consent to proceed.  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 Abnormal results including positive ANA, elevated sed rate and CRP, and monocloncal gammopathy.  He has a history of recurrent pleural effusions, with a significant episode where 2.5 liters of fluid were drained from his chest cavity, causing pressure on his lungs. He is not currently taking steroids. He is on multiple heart medications, including amiodarone . He experienced shortness of breath prior to the fluid drainage and has had recurrent episodes of shortness of breath since then.  He was diagnosed with squamous cell cancer two years ago after discovering a lump under his throat. He underwent immunotherapy and had lymph nodes removed, which were clear. He did not receive radiation therapy. He reports significant weight loss from 200 pounds 18 months ago and has experienced dry mouth and loss of taste since the immunotherapy. He has lost two teeth and broken another, which he attributes to dry mouth and weakened teeth.  He reports weakness in his hands, noting difficulty with tasks such as writing, holding utensils, and opening jars. This has developed over the past six to eight months. No swelling in his hands but decreased strength.  He has a history of dry  mouth since immunotherapy, with no significant dry eye symptoms. He uses eye drops regularly but has not used any medication for mouth dryness. No choking or food getting stuck.  He has a history of thyroid  issues and was recently advised to adjust his medication intake to improve absorption. He reports being cold and experiencing muscle aches, which he attributes to his thyroid  condition.  He has a history of arthritis. He wears compression socks but denies significant leg swelling. He reports cold fingers but no color changes. He has a history of a 'stuck finger' that was treated surgically seven to eight years ago.       Activities of Daily Living:  Patient reports morning stiffness for 5-10 minutes.   Patient Denies nocturnal pain.  Difficulty dressing/grooming: Denies Difficulty climbing stairs: Reports Difficulty getting out of chair: Denies Difficulty using hands for taps, buttons, cutlery, and/or writing: Reports  Review of Systems  Constitutional:  Positive for fatigue.  HENT:  Positive for mouth dryness. Negative for mouth sores.   Eyes:  Negative for dryness.  Respiratory:  Positive for shortness of breath.   Cardiovascular:  Negative for chest pain and palpitations.  Gastrointestinal:  Negative for blood in stool, constipation and diarrhea.  Endocrine: Negative for increased urination.  Genitourinary:  Negative for involuntary urination.  Musculoskeletal:  Positive for muscle weakness and morning stiffness. Negative for joint pain, gait problem, joint pain, joint swelling, myalgias, muscle tenderness and myalgias.  Skin:  Positive for sensitivity  to sunlight. Negative for color change, rash and hair loss.  Allergic/Immunologic: Negative for susceptible to infections.  Neurological:  Negative for dizziness and headaches.  Hematological:  Negative for swollen glands.  Psychiatric/Behavioral:  Negative for depressed mood and sleep disturbance. The patient is not  nervous/anxious.     PMFS History:  Patient Active Problem List   Diagnosis Date Noted   Positive ANA (antinuclear antibody) 09/01/2024   Protein-calorie malnutrition, severe 03/24/2024   ILD (interstitial lung disease) (HCC) 03/20/2024   Encounter to discuss test results 03/20/2024   Pulmonary hypertension, unspecified (HCC) 03/20/2024   Acute right heart failure (HCC) 03/20/2024   Hypervolemia 03/18/2024   Hypoalbuminemia 03/18/2024   Acute on chronic combined systolic and diastolic heart failure (HCC) 03/18/2024   Persistent atrial fibrillation (HCC) 03/18/2024   Pleural effusion on right 03/16/2024   CHF (congestive heart failure) (HCC) 03/16/2024   Acute respiratory failure (HCC) 03/16/2024   Recurrent right pleural effusion 03/16/2024   Pleural effusion 03/16/2024   Encounter for monitoring amiodarone  therapy 01/23/2024   Paroxysmal atrial fibrillation (HCC) 01/08/2024   Hypercoagulable state due to paroxysmal atrial fibrillation (HCC) 01/08/2024   Incarcerated umbilical hernia 07/11/2023   Squamous cell carcinoma of skin 11/30/2022   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   OA (osteoarthritis) of hip 10/31/2016    Past Medical History:  Diagnosis Date   Arthritis    oa   BPH (benign prostatic hyperplasia)    Cancer (HCC) 1 yrs ago   melanoma removed right elbow   Cataracts, bilateral    Glaucoma    Hypertension    Vitamin D  deficiency     Family History  Problem Relation Age of Onset   Cancer Mother    Cancer Father    Cancer - Ovarian Sister    Lymphoma Sister    Heart attack Brother        COD   Healthy Son    Healthy Daughter    Healthy Daughter    Healthy Daughter    Heart attack Brother    Past Surgical History:  Procedure Laterality Date   CARDIOVERSION N/A 03/05/2024   Procedure: CARDIOVERSION;  Surgeon: Mona Vinie BROCKS, MD;  Location: [facility] INVASIVE CV LAB;  Service: Cardiovascular;  Laterality: N/A;   CARDIOVERSION  N/A 03/24/2024   Procedure: CARDIOVERSION;  Surgeon: Rolan Ezra RAMAN, MD;  Location[facility] INVASIVE CV LAB;  Service: Cardiovascular;  Laterality: N/A;   CARDIOVERSION N/A 04/24/2024   Procedure: CARDIOVERSION;  Surgeon: Okey Vina GAILS, MD;  Location: [facility] INVASIVE CV LAB;  Service: Cardiovascular;  Laterality: N/A;   CHEST TUBE INSERTION Right 03/18/2024   Procedure: CHEST TUBE INSERTION;  Surgeon: Annella Donnice SAUNDERS, MD;  Location: [facility] ENDOSCOPY;  Service: Pulmonary;  Laterality: Right;   CHOLECYSTECTOMY     colonscopy  2017   IR THORACENTESIS ASP PLEURAL SPACE W/IMG GUIDE  03/03/2024   JOINT REPLACEMENT Right    hip    RIGHT HEART CATH N/A 03/23/2024   Procedure: RIGHT HEART CATH;  Surgeon: Rolan Ezra RAMAN, MD;  Location: [facility] INVASIVE CV LAB;  Service: Cardiovascular;  Laterality: N/A;   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   Procedure: LEFT TOTAL HIP ARTHROPLASTY ANTERIOR APPROACH;  Surgeon: Dempsey Moan, MD;  Location: [facility] ORS;  Service: Orthopedics;  Laterality: Left;   VENTRAL HERNIA REPAIR N/A 07/12/2023   Procedure: HERNIA REPAIR VENTRAL;  Surgeon: Lyndel Deward PARAS, MD;  Location: [facility] OR;  Service: General;  Laterality: N/A;   Social  History   Tobacco Use   Smoking status: Former    Current packs/day: 0.00    Average packs/day: 1.5 packs/day for 10.0 years (15.0 ttl pk-yrs)    Types: Cigarettes    Start date: 38    Quit date: 28    Years since quitting: 55.9    Passive exposure: Never   Smokeless tobacco: Never  Vaping Use   Vaping status: Never Used  Substance Use Topics   Alcohol use: Not Currently    Alcohol/week: 2.0 standard drinks of alcohol    Types: 2 Glasses of wine per week    Comment: 1-2 glasses of wine   Drug use: No   Social History   Social History Narrative   Not on file     Immunization History  Administered Date(s) Administered   Influenza-Unspecified 08/16/2023   PFIZER Comirnaty (Gray Top)Covid-19 Tri-Sucrose Vaccine 05/05/2021   PFIZER(Purple  Top)SARS-COV-2 Vaccination 11/04/2019, 11/22/2019, 10/01/2020   Pfizer(Comirnaty )Fall Seasonal Vaccine 12 years and older 07/16/2024     Objective: Vital Signs: BP (!) 136/48 (BP Location: Right Arm, Patient Position: Sitting, Cuff Size: Normal)   Pulse (!) 47   Temp (!) 97.5 F (36.4 C)   Resp 19   Ht 6' 1 (1.854 m)   Wt 155 lb (70.3 kg)   BMI 20.45 kg/m    Physical Exam HENT:     Nose: Nose normal.     Comments: Deviated nasal septum Eyes:     Conjunctiva/sclera: Conjunctivae normal.  Cardiovascular:     Rate and Rhythm: Normal rate and regular rhythm.  Pulmonary:     Effort: Pulmonary effort is normal.  Lymphadenopathy:     Cervical: No cervical adenopathy.  Skin:    General: Skin is warm and dry.  Neurological:     Mental Status: He is alert.  Psychiatric:        Mood and Affect: Mood normal.      Musculoskeletal Exam:  Neck full ROM no tenderness Shoulders full ROM no tenderness or swelling Elbows full ROM no tenderness or swelling Wrists restricted wrist mobility, no tenderness or swelling Fingers left 5th finger partially contracted No paraspinal tenderness to palpation over upper and lower back Restricted hip internal rotation, no tenderness to lateral hip palpation Knees full ROM no tenderness or swelling, crepitus, bony nodule at patellar tendon origin Ankles full ROM no tenderness or swelling MTPs full ROM no tenderness or swelling      Investigation: No additional findings.  Imaging: No results found.  Recent Labs: Lab Results  Component Value Date   WBC 4.2 06/02/2024   HGB 12.4 (L) 06/02/2024   PLT 152 06/02/2024   NA 139 08/19/2024   K 4.2 08/19/2024   CL 100 08/19/2024   CO2 26 08/19/2024   GLUCOSE 120 (H) 08/19/2024   BUN 23 08/19/2024   CREATININE 1.21 08/19/2024   BILITOT 0.7 08/19/2024   ALKPHOS 92 08/19/2024   AST 20 08/19/2024   ALT 14 08/19/2024   PROT 6.8 08/19/2024   ALBUMIN  3.1 (L) 08/19/2024   CALCIUM 8.8  08/19/2024   GFRAA >60 06/17/2018    Speciality Comments: No specialty comments available.  Procedures:  No procedures performed Allergies: Patient has no known allergies.   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 Differential includes other autoimmune diseases. - Ordered blood tests for autoimmune markers, including sedentation rate, complement levels, and ANA. - Provided information on pilocarpine or cevimeline for xerostomia, may check with cardiology before starting given complicated picture  Generalized arthritis - Plan: XR Hand 2 View Right, XR Hand 2 View Left, Sedimentation rate, C-reactive protein, C3 and C4, Anti-DNA antibody, double-stranded, Sjogrens syndrome-A extractable nuclear antibody  Restricted wrist mobility and hand weakness suggest possible inflammatory arthritis. No swelling or rash observed. - Ordered x-ray of hands to evaluate for inflammatory arthritis.  Squamous cell carcinoma of neck, status post immunotherapy and surgery, with dental complications and xerostomia Post-treatment complications include dental issues and xerostomia, likely due to immunotherapy and surgery. No further cancer activity on recent CT scan. Hard to differentiated from any underlying primary CTD such as sjogrens, no specific sialadenitis reported  Recurrent pleural effusion, currently resolved Recurrent pleural effusion previously treated with drainage. Currently resolved with no fluid accumulation.  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 Recent adjustment in medication timing advised to improve  absorption. - Continue current levothyroxine  regimen with adjusted timing.  Muscle weakness and weight loss Muscle weakness and weight loss likely related to recent illness and treatment for squamous cell carcinoma. Muscle mass loss challenging to regain due to age-related changes. - Continue participation in respiratory therapy program to improve muscle strength and endurance.     Orders: Orders Placed This Encounter  Procedures   XR Hand 2 View Right   XR Hand 2 View Left   Sedimentation rate   C-reactive protein   C3 and C4   Anti-DNA antibody, double-stranded   Sjogrens syndrome-A extractable nuclear antibody   No orders of the defined types were placed in this encounter.   Follow-Up Instructions: Return in about 4 weeks (around 09/29/2024) for New pt ?irAE/?SS f/u 41mo.  I personally spent a total of 65 minutes in the care of the patient today including preparing to see the patient, getting/reviewing separately obtained history, performing a medically appropriate exam/evaluation, counseling and educating, placing orders, referring and communicating with other health care professionals, documenting clinical information in the EHR, communicating results, and coordinating care.   Lonni LELON Ester, MD  Note - This record has been created using [employer].  Chart creation errors have been sought, but may not always  have been located. Such creation errors do not reflect on  the standard of medical care.

## 2024-09-02 DIAGNOSIS — Z85828 Personal history of other malignant neoplasm of skin: Secondary | ICD-10-CM | POA: Diagnosis not present

## 2024-09-02 DIAGNOSIS — L821 Other seborrheic keratosis: Secondary | ICD-10-CM | POA: Diagnosis not present

## 2024-09-02 DIAGNOSIS — L570 Actinic keratosis: Secondary | ICD-10-CM | POA: Diagnosis not present

## 2024-09-02 DIAGNOSIS — D692 Other nonthrombocytopenic purpura: Secondary | ICD-10-CM | POA: Diagnosis not present

## 2024-09-02 LAB — ANTI-DNA ANTIBODY, DOUBLE-STRANDED: ds DNA Ab: 19 [IU]/mL — ABNORMAL HIGH

## 2024-09-02 LAB — C3 AND C4
C3 Complement: 111 mg/dL
C4 Complement: 12 mg/dL

## 2024-09-02 LAB — C-REACTIVE PROTEIN: CRP: 21.5 mg/L — ABNORMAL HIGH (ref <8.0)

## 2024-09-02 LAB — SJOGRENS SYNDROME-A EXTRACTABLE NUCLEAR ANTIBODY: SSA (Ro) (ENA) Antibody, IgG: >8 AI — AB

## 2024-09-02 LAB — SEDIMENTATION RATE: Sed Rate: 39 mm/h — ABNORMAL HIGH (ref 0–20)

## 2024-09-03 ENCOUNTER — Other Ambulatory Visit: Payer: Self-pay | Admitting: "Endocrinology

## 2024-09-03 ENCOUNTER — Encounter (HOSPITAL_COMMUNITY)
Admission: RE | Admit: 2024-09-03 | Discharge: 2024-09-03 | Disposition: A | Discharge status: Home / Self Care | Source: Ambulatory Visit | Attending: Pulmonary Disease | Admitting: Pulmonary Disease

## 2024-09-03 DIAGNOSIS — E039 Hypothyroidism, unspecified: Secondary | ICD-10-CM

## 2024-09-03 DIAGNOSIS — J849 Interstitial pulmonary disease, unspecified: Secondary | ICD-10-CM | POA: Diagnosis not present

## 2024-09-03 NOTE — Progress Notes (Signed)
 Daily Session Note  Patient Details  Name: Samuel Porter MRN: [id number] Date of Birth: Aug 11, 1941 Referring Provider:   [name] Pulmonary Rehab Walk Test from 06/22/2024 in [facility]  Referring Provider [name]    Encounter Date: 09/03/2024  Check In:  Session Check In - 09/03/24 1118       Check-In   Supervising physician immediately available to respond to emergencies [facility] MD immediately available    Physician(s) Orren Fabry, NP    Location [facility]    Staff Present Augustin Sharps, Neita Moats, MS, ACSM-CEP, Exercise Physiologist;Mary Harvy, RN, BSN;Randi Reeve BS, ACSM-CEP, Exercise Physiologist    Virtual Visit No    Medication changes reported     No    Fall or balance concerns reported    No    Tobacco Cessation No Change    Warm-up and Cool-down Performed as group-led instruction    Resistance Training Performed Yes    VAD Patient? No    PAD/SET Patient? No      Pain Assessment   Currently in Pain? No/denies    Multiple Pain Sites No          Capillary Blood Glucose: No results found for this or any previous visit (from the past 24 hours).    Social History   Tobacco Use  Smoking Status Former   Current packs/day: 0.00   Average packs/day: 1.5 packs/day for 10.0 years (15.0 ttl pk-yrs)   Types: Cigarettes   Start date: 25   Quit date: 1970   Years since quitting: 55.9   Passive exposure: Never  Smokeless Tobacco Never    Goals Met:  Proper associated with RPD/PD & O2 Sat Independence with exercise equipment Exercise tolerated well No report of concerns or symptoms today Strength training completed today  Goals Unmet:  Not Applicable  Comments: Service time is from 1015 to 1145.    Dr. Slater Staff is Medical Director for Pulmonary Rehab at [facility].

## 2024-09-07 ENCOUNTER — Encounter: Payer: Self-pay | Admitting: Oncology

## 2024-09-07 ENCOUNTER — Other Ambulatory Visit

## 2024-09-08 ENCOUNTER — Encounter (HOSPITAL_COMMUNITY)
Admission: RE | Admit: 2024-09-08 | Discharge: 2024-09-08 | Disposition: A | Discharge status: Home / Self Care | Source: Ambulatory Visit | Attending: Pulmonary Disease | Admitting: Pulmonary Disease

## 2024-09-08 ENCOUNTER — Telehealth (HOSPITAL_COMMUNITY): Payer: Self-pay | Admitting: *Deleted

## 2024-09-08 VITALS — Wt 152.1 lb

## 2024-09-08 DIAGNOSIS — J849 Interstitial pulmonary disease, unspecified: Secondary | ICD-10-CM | POA: Diagnosis not present

## 2024-09-08 NOTE — Progress Notes (Signed)
 Daily Session Note  Patient Details  Name: Samuel Porter MRN: [id number] Date of Birth: October 19, 1940 Referring Provider:   [name] Pulmonary Rehab Walk Test from 06/22/2024 in [facility]  Referring Provider [name]    Encounter Date: 09/08/2024  Check In:  Session Check In - 09/08/24 1132       Check-In   Supervising physician immediately available to respond to emergencies [facility] MD immediately available    Physician(s) Rosabel Mose, NP    Location [facility]    Staff Present Augustin Sharps, Neita Moats, MS, ACSM-CEP, Exercise Physiologist;Rudolf Blizard Harvy, RN, BSN;Randi Reeve BS, ACSM-CEP, Exercise Physiologist    Virtual Visit No    Medication changes reported     No    Fall or balance concerns reported    No    Tobacco Cessation No Change    Warm-up and Cool-down Performed as group-led instruction    Resistance Training Performed Yes    VAD Patient? No    PAD/SET Patient? No      Pain Assessment   Currently in Pain? No/denies    Multiple Pain Sites No          Capillary Blood Glucose: No results found for this or any previous visit (from the past 24 hours).   Exercise Prescription Changes - 09/08/24 1200       Response to Exercise   Blood Pressure (Admit) 130/86    Blood Pressure (Exercise) 120/66    Blood Pressure (Exit) 116/58    Heart Rate (Admit) 43 bpm    Heart Rate (Exercise) 56 bpm    Heart Rate (Exit) 46 bpm    Oxygen  Saturation (Admit) 98 %   3L   Oxygen  Saturation (Exercise) 89 %   4L   Oxygen  Saturation (Exit) 97 %   3L POC   Rating of Perceived Exertion (Exercise) 12    Perceived Dyspnea (Exercise) 1    Duration Continue with 30 min of aerobic exercise without signs/symptoms of physical distress.    Intensity THRR unchanged      Progression   Progression Continue to progress workloads to maintain intensity without signs/symptoms of physical distress.      Resistance  Training   Training Prescription Yes    Weight red bands    Reps 10-15    Time 10 Minutes      Oxygen    Oxygen  Continuous    Liters 3-4      NuStep   Level 4    SPM 78    Minutes 15    METs 2.3      Track   Laps 7    Minutes 15    METs 2.08      Oxygen    Maintain Oxygen  Saturation 88% or higher          Social History   Tobacco Use  Smoking Status Former   Current packs/day: 0.00   Average packs/day: 1.5 packs/day for 10.0 years (15.0 ttl pk-yrs)   Types: Cigarettes   Start date: 70   Quit date: 75   Years since quitting: 55.9   Passive exposure: Never  Smokeless Tobacco Never    Goals Met:  Independence with exercise equipment Exercise tolerated well Queuing for purse lip breathing No report of concerns or symptoms today Strength training completed today  Goals Unmet:  Not Applicable  Comments: Service time is from 1010 to 1141    Dr. Slater Staff is  Medical Director for Pulmonary Rehab at [facility]

## 2024-09-08 NOTE — Telephone Encounter (Signed)
 Mary RN from cardiac/pulmonary rehab called regarding concern of patients heart rates during pulmonary rehab. Started at 13 with exercise to 56 and end of session 46. Pt is asymptomatic but she wanted to call to report the HRs. She said session on Monday heart rate was 41 at one point. Per Daril Kicks, PA -- fortunately, that is a longstanding thing and he is asymptomatic. may have to reduce amiodarone  in the future. he isn't on any rate controlling meds. No changes for now. If patient should become symptomatic instructed to notify office.

## 2024-09-09 NOTE — Progress Notes (Signed)
 Pulmonary Individual Treatment Plan  Patient Details  Name: Samuel Porter MRN: [id number] Date of Birth: Sep 05, 1941 Referring Provider:   [name] Pulmonary Rehab Walk Test from 06/22/2024 in [facility]  Referring Provider [name]    Initial Encounter Date:  Flowsheet Row Pulmonary Rehab Walk Test from 06/22/2024 in [facility] for [facility]  Date 06/22/24    Visit Diagnosis: ILD (interstitial lung disease) (HCC)  Patient's Home Medications on Admission:   Current Outpatient Medications:    acetaminophen  (TYLENOL ) 325 MG tablet, Take 650 mg by mouth daily as needed for moderate pain (pain score 4-6) or mild pain (pain score 1-3). (Patient taking differently: Take 650 mg by mouth as needed for moderate pain (pain score 4-6) or mild pain (pain score 1-3).), Disp: , Rfl:    albuterol  (VENTOLIN  HFA) 108 (90 Base) MCG/ACT inhaler, Inhale 2 puffs into the lungs every 4 (four) hours as needed for shortness of breath., Disp: , Rfl:    amiodarone  (PACERONE ) 200 MG tablet, Take 1 tablet (200 mg total) by mouth daily., Disp: , Rfl:    cholecalciferol  (VITAMIN D ) 1000 units tablet, Take 1,000 Units by mouth daily., Disp: , Rfl:    dapagliflozin  propanediol (FARXIGA ) 10 MG TABS tablet, Take 1 tablet (10 mg total) by mouth daily before breakfast., Disp: 30 tablet, Rfl: 11   docusate sodium  (COLACE) 100 MG capsule, Take 1 capsule (100 mg total) by mouth 2 (two) times daily as needed for mild constipation., Disp: , Rfl:    dorzolamide -timolol  (COSOPT ) 22.3-6.8 MG/ML ophthalmic solution, Place 1 drop into both eyes 2 (two) times daily., Disp: , Rfl:    ELIQUIS  5 MG TABS tablet, Take 5 mg by mouth 2 (two) times daily., Disp: , Rfl:    feeding supplement (BOOST HIGH PROTEIN) LIQD, Take 1 Container by mouth daily., Disp: , Rfl:    feeding supplement (ENSURE PLUS HIGH PROTEIN) LIQD, Take 237 mLs by  mouth 3 (three) times daily between meals., Disp: , Rfl:    finasteride  (PROSCAR ) 5 MG tablet, Take 5 mg by mouth daily., Disp: , Rfl:    furosemide  (LASIX ) 20 MG tablet, Take 1 tablet (20 mg total) by mouth daily., Disp: 90 tablet, Rfl: 3   levothyroxine  (SYNTHROID ) 88 MCG tablet, 88 mcg., Disp: , Rfl:    mirtazapine  (REMERON ) 7.5 MG tablet, Take 1 tablet (7.5 mg total) by mouth at bedtime., Disp: 30 tablet, Rfl: 0   Multiple Vitamin (MULTIVITAMIN WITH MINERALS) TABS tablet, Take 1 tablet by mouth daily., Disp: 30 tablet, Rfl: 0   Nutritional Supplements (,FEEDING SUPPLEMENT, PROSOURCE PLUS) liquid, Take 30 mLs by mouth 2 (two) times daily before lunch and supper., Disp: 887 mL, Rfl: 0   polyethylene glycol (MIRALAX  / GLYCOLAX ) 17 g packet, Take 17 g by mouth 2 (two) times daily as needed., Disp: , Rfl:    sildenafil (VIAGRA) 25 MG tablet, Take 25 mg by mouth as needed for erectile dysfunction., Disp: , Rfl:   Past Medical History: Past Medical History:  Diagnosis Date   Arthritis    oa   BPH (benign prostatic hyperplasia)    Cancer (HCC) 1 yrs ago   melanoma removed right elbow   Cataracts, bilateral    Glaucoma    Hypertension    Vitamin D  deficiency     Tobacco Use: Social History   Tobacco Use  Smoking Status Former   Current packs/day: 0.00  Average packs/day: 1.5 packs/day for 10.0 years (15.0 ttl pk-yrs)   Types: Cigarettes   Start date: 103   Quit date: 64   Years since quitting: 55.9   Passive exposure: Never  Smokeless Tobacco Never    Labs: Review Flowsheet       Latest Ref Rng & Units 07/11/2023 03/23/2024  Labs for ITP Cardiac and Pulmonary Rehab  Bicarbonate 20.0 - 28.0 mmol/L 38.1  39.9  39.7   TCO2 22 - 32 mmol/L 40  42  42   O2 Saturation % 54  69  67     Details       Multiple values from one day are sorted in reverse-chronological order         Capillary Blood Glucose: Lab Results  Component Value Date   GLUCAP 147 (H) 07/12/2023    GLUCAP 98 11/27/2022     Pulmonary Assessment Scores:  Pulmonary Assessment Scores     Row Name 06/22/24 0920 09/01/24 1204       ADL UCSD   ADL Phase Entry Exit    SOB Score total 32 30      CAT Score   CAT Score 2 6      mMRC Score   mMRC Score 2 --      UCSD: Self-administered rating of dyspnea associated with activities of daily living (ADLs) 6-point scale (0 = not at all to 5 = maximal or unable to do because of breathlessness)  Scoring Scores range from 0 to 120.  Minimally important difference is 5 units  CAT: CAT can identify the health impairment of COPD patients and is better correlated with disease progression.  CAT has a scoring range of zero to 40. The CAT score is classified into four groups of low (less than 10), medium (10 - 20), high (21-30) and very high (31-40) based on the impact level of disease on health status. A CAT score over 10 suggests significant symptoms.  A worsening CAT score could be explained by an exacerbation, poor medication adherence, poor inhaler technique, or progression of COPD or comorbid conditions.  CAT MCID is 2 points  mMRC: mMRC (Modified Medical Research Council) Dyspnea Scale is used to assess the degree of baseline functional disability in patients of respiratory disease due to dyspnea. No minimal important difference is established. A decrease in score of 1 point or greater is considered a positive change.   Pulmonary Function Assessment:  Pulmonary Function Assessment - 06/22/24 0920       Breath   Shortness of Breath Yes;Limiting activity          Exercise Target Goals: Exercise Program Goal: Individual exercise prescription set using results from initial 6 min walk test and THRR while considering  patient's activity barriers and safety.   Exercise Prescription Goal: Initial exercise prescription builds to 30-45 minutes a day of aerobic activity, 2-3 days per week.  Home exercise guidelines will be given to  patient during program as part of exercise prescription that the participant will acknowledge.  Activity Barriers & Risk Stratification:  Activity Barriers & Cardiac Risk Stratification - 06/22/24 0935       Activity Barriers & Cardiac Risk Stratification   Activity Barriers Deconditioning;Balance Concerns;Muscular Weakness;Shortness of Breath;Assistive Device;Right Hip Replacement;Left Hip Replacement          6 Minute Walk:  6 Minute Walk     Row Name 06/22/24 1026         6 Minute Walk   Phase  Initial     Distance 770 feet     Walk Time 6 minutes     # of Rest Breaks 0     MPH 1.46     METS 1.65     RPE 11     Perceived Dyspnea  1     VO2 Peak 5.76     Symptoms No     Resting HR 50 bpm     Resting BP 132/68     Resting Oxygen  Saturation  99 %     Exercise Oxygen  Saturation  during 6 min walk 97 %     Max Ex. HR 69 bpm     Max Ex. BP 150/60     2 Minute Post BP 128/66       Interval HR   1 Minute HR 64     2 Minute HR 65     3 Minute HR 66     4 Minute HR 69     5 Minute HR 69     6 Minute HR 69     2 Minute Post HR 66     Interval Heart Rate? Yes       Interval Oxygen    Interval Oxygen ? Yes     Baseline Oxygen  Saturation % 99 %     1 Minute Oxygen  Saturation % 100 %     1 Minute Liters of Oxygen  3 L     2 Minute Oxygen  Saturation % 99 %     2 Minute Liters of Oxygen  3 L     3 Minute Oxygen  Saturation % 98 %     3 Minute Liters of Oxygen  3 L     4 Minute Oxygen  Saturation % 99 %     4 Minute Liters of Oxygen  3 L     5 Minute Oxygen  Saturation % 97 %     5 Minute Liters of Oxygen  3 L     6 Minute Oxygen  Saturation % 97 %     6 Minute Liters of Oxygen  3 L     2 Minute Post Oxygen  Saturation % 97 %     2 Minute Post Liters of Oxygen  3 L        Oxygen  Initial Assessment:  Oxygen  Initial Assessment - 06/22/24 0919       Home Oxygen    Home Oxygen  Device Portable Concentrator;Home Concentrator    Sleep Oxygen  Prescription Continuous    Liters per  minute 1    Home Exercise Oxygen  Prescription Pulsed    Liters per minute 3    Home Resting Oxygen  Prescription Pulsed    Liters per minute 2    Compliance with Home Oxygen  Use Yes      Initial 6 min Walk   Oxygen  Used Continuous    Liters per minute 3      Program Oxygen  Prescription   Program Oxygen  Prescription Continuous    Liters per minute 3      Intervention   Short Term Goals To learn and exhibit compliance with exercise, home and travel O2 prescription;To learn and understand importance of maintaining oxygen  saturations>88%;To learn and demonstrate proper use of respiratory medications;To learn and understand importance of monitoring SPO2 with pulse oximeter and demonstrate accurate use of the pulse oximeter.;To learn and demonstrate proper pursed lip breathing techniques or other breathing techniques.     Long  Term Goals Exhibits compliance with exercise, home  and travel O2 prescription;Verbalizes importance of monitoring SPO2 with  pulse oximeter and return demonstration;Maintenance of O2 saturations>88%;Exhibits proper breathing techniques, such as pursed lip breathing or other method taught during program session;Compliance with respiratory medication;Demonstrates proper use of MDI's          Oxygen  Re-Evaluation:  Oxygen  Re-Evaluation     Row Name 07/03/24 1102 07/31/24 0923 09/02/24 1115         Program Oxygen  Prescription   Program Oxygen  Prescription Continuous Continuous Continuous     Liters per minute 3 3 3        Home Oxygen    Home Oxygen  Device Portable Concentrator;Home Concentrator Portable Concentrator;Home Concentrator Portable Concentrator;Home Concentrator     Sleep Oxygen  Prescription Continuous Continuous Continuous     Liters per minute 1 1 1      Home Exercise Oxygen  Prescription Pulsed Pulsed Pulsed     Liters per minute 3 3 3      Home Resting Oxygen  Prescription Pulsed Pulsed Pulsed     Liters per minute 2 2 2      Compliance with Home Oxygen   Use Yes Yes Yes       Goals/Expected Outcomes   Short Term Goals To learn and exhibit compliance with exercise, home and travel O2 prescription;To learn and understand importance of maintaining oxygen  saturations>88%;To learn and demonstrate proper use of respiratory medications;To learn and understand importance of monitoring SPO2 with pulse oximeter and demonstrate accurate use of the pulse oximeter.;To learn and demonstrate proper pursed lip breathing techniques or other breathing techniques.  To learn and exhibit compliance with exercise, home and travel O2 prescription;To learn and understand importance of maintaining oxygen  saturations>88%;To learn and demonstrate proper use of respiratory medications;To learn and understand importance of monitoring SPO2 with pulse oximeter and demonstrate accurate use of the pulse oximeter.;To learn and demonstrate proper pursed lip breathing techniques or other breathing techniques.  To learn and exhibit compliance with exercise, home and travel O2 prescription;To learn and understand importance of maintaining oxygen  saturations>88%;To learn and demonstrate proper use of respiratory medications;To learn and understand importance of monitoring SPO2 with pulse oximeter and demonstrate accurate use of the pulse oximeter.;To learn and demonstrate proper pursed lip breathing techniques or other breathing techniques.      Long  Term Goals Exhibits compliance with exercise, home  and travel O2 prescription;Verbalizes importance of monitoring SPO2 with pulse oximeter and return demonstration;Maintenance of O2 saturations>88%;Exhibits proper breathing techniques, such as pursed lip breathing or other method taught during program session;Compliance with respiratory medication;Demonstrates proper use of MDI's Exhibits compliance with exercise, home  and travel O2 prescription;Verbalizes importance of monitoring SPO2 with pulse oximeter and return demonstration;Maintenance of O2  saturations>88%;Exhibits proper breathing techniques, such as pursed lip breathing or other method taught during program session;Compliance with respiratory medication;Demonstrates proper use of MDI's Exhibits compliance with exercise, home  and travel O2 prescription;Verbalizes importance of monitoring SPO2 with pulse oximeter and return demonstration;Maintenance of O2 saturations>88%;Exhibits proper breathing techniques, such as pursed lip breathing or other method taught during program session;Compliance with respiratory medication;Demonstrates proper use of MDI's     Goals/Expected Outcomes Compliance and understanding of oxygen  saturation monitoring and breathing techniques to decrease shortness of breath. Compliance and understanding of oxygen  saturation monitoring and breathing techniques to decrease shortness of breath. Compliance and understanding of oxygen  saturation monitoring and breathing techniques to decrease shortness of breath.        Oxygen  Discharge (Final Oxygen  Re-Evaluation):  Oxygen  Re-Evaluation - 09/02/24 1115       Program Oxygen  Prescription   Program Oxygen  Prescription Continuous  Liters per minute 3      Home Oxygen    Home Oxygen  Device Portable Concentrator;Home Concentrator    Sleep Oxygen  Prescription Continuous    Liters per minute 1    Home Exercise Oxygen  Prescription Pulsed    Liters per minute 3    Home Resting Oxygen  Prescription Pulsed    Liters per minute 2    Compliance with Home Oxygen  Use Yes      Goals/Expected Outcomes   Short Term Goals To learn and exhibit compliance with exercise, home and travel O2 prescription;To learn and understand importance of maintaining oxygen  saturations>88%;To learn and demonstrate proper use of respiratory medications;To learn and understand importance of monitoring SPO2 with pulse oximeter and demonstrate accurate use of the pulse oximeter.;To learn and demonstrate proper pursed lip breathing techniques or other  breathing techniques.     Long  Term Goals Exhibits compliance with exercise, home  and travel O2 prescription;Verbalizes importance of monitoring SPO2 with pulse oximeter and return demonstration;Maintenance of O2 saturations>88%;Exhibits proper breathing techniques, such as pursed lip breathing or other method taught during program session;Compliance with respiratory medication;Demonstrates proper use of MDI's    Goals/Expected Outcomes Compliance and understanding of oxygen  saturation monitoring and breathing techniques to decrease shortness of breath.          Initial Exercise Prescription:  Initial Exercise Prescription - 06/22/24 1000       Date of Initial Exercise RX and Referring Provider   Date 06/22/24    Referring Provider [name]    Expected Discharge Date 09/17/24      Oxygen    Oxygen  Continuous    Liters 2    Maintain Oxygen  Saturation 88% or higher      NuStep   Level 1    SPM 58    Minutes 15    METs 1.43      Track   Minutes 15    METs 1.7      Prescription Details   Frequency (times per week) 2    Duration Progress to 30 minutes of continuous aerobic without signs/symptoms of physical distress      Intensity   THRR 40-80% of Max Heartrate 55-110    Ratings of Perceived Exertion 11-13    Perceived Dyspnea 0-4      Progression   Progression Continue to progress workloads to maintain intensity without signs/symptoms of physical distress.      Resistance Training   Training Prescription Yes    Weight red bands    Reps 10-15          Perform Capillary Blood Glucose checks as needed.  Exercise Prescription Changes:   Exercise Prescription Changes     Row Name 06/30/24 1100 07/14/24 1200 07/28/24 1100 08/11/24 1200 08/25/24 1200     Response to Exercise   Blood Pressure (Admit) 128/58 102/60 146/68 110/60 138/64   Blood Pressure (Exercise) 146/78 148/68 150/80 146/70 130/70   Blood Pressure (Exit) 108/70 122/76 136/60 144/62 126/68   Heart  Rate (Admit) 46 bpm 47 bpm 47 bpm 47 bpm 45 bpm   Heart Rate (Exercise) 54 bpm 65 bpm 64 bpm 62 bpm 60 bpm   Heart Rate (Exit) 48 bpm 56 bpm 56 bpm 56 bpm 51 bpm   Oxygen  Saturation (Admit) 98 % 99 %  2L 97 %  2L 98 % 98 %   Oxygen  Saturation (Exercise) 96 % 89 %  3L 86 %  2L 95 % 90 %   Oxygen  Saturation (Exit) 95 %  92 %  3L POC 95 %  3 POC 89 % 96 %   Rating of Perceived Exertion (Exercise) 12 13 11 13 11    Perceived Dyspnea (Exercise) 1 1 1 1 1    Duration Continue with 30 min of aerobic exercise without signs/symptoms of physical distress. Continue with 30 min of aerobic exercise without signs/symptoms of physical distress. Continue with 30 min of aerobic exercise without signs/symptoms of physical distress. Continue with 30 min of aerobic exercise without signs/symptoms of physical distress. Continue with 30 min of aerobic exercise without signs/symptoms of physical distress.   Intensity THRR unchanged THRR unchanged THRR unchanged THRR unchanged THRR unchanged     Progression   Progression Continue to progress workloads to maintain intensity without signs/symptoms of physical distress. Continue to progress workloads to maintain intensity without signs/symptoms of physical distress. Continue to progress workloads to maintain intensity without signs/symptoms of physical distress. Continue to progress workloads to maintain intensity without signs/symptoms of physical distress. Continue to progress workloads to maintain intensity without signs/symptoms of physical distress.     Resistance Training   Training Prescription Yes Yes Yes Yes Yes   Weight red bands red bands red bands red bands red bands   Reps 10-15 10-15 10-15 10-15 10-15   Time 10 Minutes 10 Minutes 10 Minutes 10 Minutes 10 Minutes     Oxygen    Oxygen  Continuous Continuous Continuous Continuous Continuous   Liters 2 2 2 3  3-4     NuStep   Level 2 2 3 3 4    SPM 69 75 77 78 68   Minutes 15 15 15 15 15    METs 2 2.2 2.2 2.2 3.2      Track   Laps 5 7 7 7 6    Minutes 15 15 15 15 15    METs 1.77 2.08 2.08 2.08 1.92     Oxygen    Maintain Oxygen  Saturation 88% or higher 88% or higher 88% or higher 88% or higher 88% or higher    Row Name 09/08/24 1200             Response to Exercise   Blood Pressure (Admit) 130/86       Blood Pressure (Exercise) 120/66       Blood Pressure (Exit) 116/58       Heart Rate (Admit) 43 bpm       Heart Rate (Exercise) 56 bpm       Heart Rate (Exit) 46 bpm       Oxygen  Saturation (Admit) 98 %  3L       Oxygen  Saturation (Exercise) 89 %  4L       Oxygen  Saturation (Exit) 97 %  3L POC       Rating of Perceived Exertion (Exercise) 12       Perceived Dyspnea (Exercise) 1       Duration Continue with 30 min of aerobic exercise without signs/symptoms of physical distress.       Intensity THRR unchanged         Progression   Progression Continue to progress workloads to maintain intensity without signs/symptoms of physical distress.         Resistance Training   Training Prescription Yes       Weight red bands       Reps 10-15       Time 10 Minutes         Oxygen    Oxygen  Continuous       Liters 3-4  NuStep   Level 4       SPM 78       Minutes 15       METs 2.3         Track   Laps 7       Minutes 15       METs 2.08         Oxygen    Maintain Oxygen  Saturation 88% or higher          Exercise Comments:   Exercise Comments     Row Name 08/25/24 1522           Exercise Comments Completed home exercise plan. Johneric is currently exercising at home. He stated he has access to an exercise maching very similar to the Nustep. I encouraged him to continue exercising on the Nustep at home. He also stated he exercises for 10 min/day. I recommended increasing his time to 2x15 min/day. He agreed with my recommendations. Inez seems motivated to exercise at home.          Exercise Goals and Review:   Exercise Goals     Row Name 06/22/24 0920              Exercise Goals   Increase Physical Activity Yes       Intervention Provide advice, education, support and counseling about physical activity/exercise needs.;Develop an individualized exercise prescription for aerobic and resistive training based on initial evaluation findings, risk stratification, comorbidities and participant's personal goals.       Expected Outcomes Short Term: Attend rehab on a regular basis to increase amount of physical activity.;Long Term: Add in home exercise to make exercise part of routine and to increase amount of physical activity.;Long Term: Exercising regularly at least 3-5 days a week.       Increase Strength and Stamina Yes       Intervention Provide advice, education, support and counseling about physical activity/exercise needs.;Develop an individualized exercise prescription for aerobic and resistive training based on initial evaluation findings, risk stratification, comorbidities and participant's personal goals.       Expected Outcomes Short Term: Increase workloads from initial exercise prescription for resistance, speed, and METs.;Short Term: Perform resistance training exercises routinely during rehab and add in resistance training at home;Long Term: Improve cardiorespiratory fitness, muscular endurance and strength as measured by increased METs and functional capacity ( )       Able to understand and use rate of perceived exertion (RPE) scale Yes       Intervention Provide education and explanation on how to use RPE scale       Expected Outcomes Short Term: Able to use RPE daily in rehab to express subjective intensity level;Long Term:  Able to use RPE to guide intensity level when exercising independently       Able to understand and use Dyspnea scale Yes       Intervention Provide education and explanation on how to use Dyspnea scale       Expected Outcomes Short Term: Able to use Dyspnea scale daily in rehab to express subjective sense of shortness of breath  during exertion;Long Term: Able to use Dyspnea scale to guide intensity level when exercising independently       Knowledge and understanding of Target Heart Rate Range (THRR) Yes       Intervention Provide education and explanation of THRR including how the numbers were predicted and where they are located for reference       Expected  Outcomes Short Term: Able to state/look up THRR;Long Term: Able to use THRR to govern intensity when exercising independently;Short Term: Able to use daily as guideline for intensity in rehab       Understanding of Exercise Prescription Yes       Intervention Provide education, explanation, and written materials on patient's individual exercise prescription       Expected Outcomes Short Term: Able to explain program exercise prescription;Long Term: Able to explain home exercise prescription to exercise independently          Exercise Goals Re-Evaluation :  Exercise Goals Re-Evaluation     Row Name 07/03/24 1102 07/31/24 0921 09/02/24 1113         Exercise Goal Re-Evaluation   Exercise Goals Review Increase Physical Activity;Able to understand and use Dyspnea scale;Understanding of Exercise Prescription;Increase Strength and Stamina;Knowledge and understanding of Target Heart Rate Range (THRR);Able to understand and use rate of perceived exertion (RPE) scale Increase Physical Activity;Able to understand and use Dyspnea scale;Understanding of Exercise Prescription;Increase Strength and Stamina;Knowledge and understanding of Target Heart Rate Range (THRR);Able to understand and use rate of perceived exertion (RPE) scale Increase Physical Activity;Able to understand and use Dyspnea scale;Understanding of Exercise Prescription;Increase Strength and Stamina;Knowledge and understanding of Target Heart Rate Range (THRR);Able to understand and use rate of perceived exertion (RPE) scale     Comments Ondre has completed 2 exercise sessions. He exercises for 15 min on the Nustep  and track. He averages 2.3 METs at level 2 on the Nustep and 1.92 METs on the track. He performs the warmup and cooldown standing without limitations. It is too soon to notate any discernable progressions. Will continue to monitor and progress as able. Treyshon has completed 10 exercise sessions. He exercises for 15 min on the Nustep and track. He averages 2.3 METs at level 3 on the Nustep and 1.92 METs on the track. He performs the warmup and cooldown standing without limitations. Erskine has increased his level on the Nustep as METs are relatively the same. His track laps remain the same. Dillinger is difficult to progress due to his deconditioned state. Will continue to monitor and progress as able. Stepehn has completed 10 exercise sessions. He exercises for 15 min on the Nustep and track. He averages 2.4 METs at level 4 on the Nustep and 2.08 METs on the track. He performs the warmup and cooldown standing holding onto a rollator for balance. He has increased his level on the Nustep. He tolerates this increase well. We have discussed home exercise as Evaan is exercising at home. He seems motivated to continue exercise.     Expected Outcomes Through exercise at rehab and home, the patient will decrease shortness of breath with daily activities and feel confident in carrying out an exercise regimen at home. Through exercise at rehab and home, the patient will decrease shortness of breath with daily activities and feel confident in carrying out an exercise regimen at home. Through exercise at rehab and home, the patient will decrease shortness of breath with daily activities and feel confident in carrying out an exercise regimen at home.        Discharge Exercise Prescription (Final Exercise Prescription Changes):  Exercise Prescription Changes - 09/08/24 1200       Response to Exercise   Blood Pressure (Admit) 130/86    Blood Pressure (Exercise) 120/66    Blood Pressure (Exit) 116/58    Heart Rate (Admit) 43 bpm     Heart Rate (Exercise) 56  bpm    Heart Rate (Exit) 46 bpm    Oxygen  Saturation (Admit) 98 %   3L   Oxygen  Saturation (Exercise) 89 %   4L   Oxygen  Saturation (Exit) 97 %   3L POC   Rating of Perceived Exertion (Exercise) 12    Perceived Dyspnea (Exercise) 1    Duration Continue with 30 min of aerobic exercise without signs/symptoms of physical distress.    Intensity THRR unchanged      Progression   Progression Continue to progress workloads to maintain intensity without signs/symptoms of physical distress.      Resistance Training   Training Prescription Yes    Weight red bands    Reps 10-15    Time 10 Minutes      Oxygen    Oxygen  Continuous    Liters 3-4      NuStep   Level 4    SPM 78    Minutes 15    METs 2.3      Track   Laps 7    Minutes 15    METs 2.08      Oxygen    Maintain Oxygen  Saturation 88% or higher          Nutrition:  Target Goals: Understanding of nutrition guidelines, daily intake of sodium 1500mg , cholesterol 200mg , calories 30% from fat and 7% or less from saturated fats, daily to have 5 or more servings of fruits and vegetables.  Biometrics:    Nutrition Therapy Plan and Nutrition Goals:  Nutrition Therapy & Goals - 08/18/24 1252       Nutrition Therapy   Diet Regular diet- soft/moist foods      Personal Nutrition Goals   Nutrition Goal Patient to identify strategies for weight gain of 0.5-2# per week.    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 Pt with underweight  based on BMI for >= 65 years. 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 Attempting to increase caloric intake with protein shake such as Chobani, Fairlife and Boost.      Intervention Plan   Intervention Prescribe, educate and counsel regarding individualized  specific dietary modifications aiming towards targeted core components such as weight, hypertension, lipid management, diabetes, heart failure and other comorbidities.;Nutrition handout(s) given to patient.   Weight Gain & Weight Maintenance   Expected Outcomes Short Term Goal: Understand basic principles of dietary content, such as calories, fat, sodium, cholesterol and nutrients.;Long Term Goal: Adherence to prescribed nutrition plan.          Nutrition Assessments:  MEDIFICTS Score Key: >=70 Need to make dietary changes  40-70 Heart Healthy Diet <= 40 Therapeutic Level Cholesterol Diet   Picture Your Plate Scores: <59 Unhealthy dietary pattern with much room for improvement. 41-50 Dietary pattern unlikely to meet recommendations for good health and room for improvement. 51-60 More healthful dietary pattern, with some room for improvement.  >60 Healthy dietary pattern, although there may be some specific behaviors that could be improved.    Nutrition Goals Re-Evaluation:  Nutrition Goals Re-Evaluation     Row Name 09/08/24 1138             Goals   Current Weight 157 lb (71.2 kg)       Nutrition Goal Patient to identify strategies for weight gain of 0.5-2# per week.  Expected Outcome Goals in action. 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 Pt with underweight based on BMI for >= 65 years. Weight relatively stable over past month. Pt reports consuming 3 meals daily in addition to high calorie oral nutrition supplement such as Boost Plus X 2 daily. Continues to struggle with dry mouth. Pt states poor dentition also impacts intake. RD provided suggestions on soft/moist foods, rinsing mouth with salt/baking soda rinse throughout day. Encouraged pt to continue  including high calorie/high protein foods with meals/snacks. High calorie oral nutrition supplements remain indicated based on low body weight, likely loss of lean body mass given report of muscle weakness, advanced age.          Nutrition Goals Discharge (Final Nutrition Goals Re-Evaluation):  Nutrition Goals Re-Evaluation - 09/08/24 1138       Goals   Current Weight 157 lb (71.2 kg)    Nutrition Goal Patient to identify strategies for weight gain of 0.5-2# per week.    Expected Outcome Goals in action. 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 Pt with underweight based on BMI for >= 65 years. Weight relatively stable over past month. Pt reports consuming 3 meals daily in addition to high calorie oral nutrition supplement such as Boost Plus X 2 daily. Continues to struggle with dry mouth. Pt states poor dentition also impacts intake. RD provided suggestions on soft/moist foods, rinsing mouth with salt/baking soda rinse throughout day. Encouraged pt to continue including high calorie/high protein foods with meals/snacks. High calorie oral nutrition supplements remain indicated based on low body weight, likely loss of lean body mass given report of muscle weakness, advanced age.          Psychosocial: Target Goals: Acknowledge presence or absence of significant depression and/or stress, maximize coping skills, provide positive support system. Participant is able to verbalize types and ability to use techniques and skills needed for reducing stress and depression.  Initial Review & Psychosocial Screening:  Initial Psych Review & Screening - 06/22/24 0931       Initial Review   Current issues with None Identified      Family Dynamics   Good Support System? Yes     Comments 2 daughters      Barriers   Psychosocial barriers to participate in program There are no identifiable barriers or psychosocial needs.      Screening Interventions   Interventions Encouraged to exercise          Quality of Life Scores:  Scores of 19 and below usually indicate a poorer quality of life in these areas.  A difference of  2-3 points is a clinically meaningful difference.  A difference of 2-3 points in the total score of the Quality of Life Index has been associated with significant improvement in overall quality of life, self-image, physical symptoms, and general health in studies assessing change in quality of life.  PHQ-9: Review Flowsheet       09/01/2024 06/22/2024 11/30/2022  Depression screen PHQ 2/9  Decreased Interest 1 0 0  Down, Depressed, Hopeless 0 0 0  PHQ - 2 Score 1 0 0  Altered sleeping  1 0 -  Tired, decreased energy 1 1 -  Change in appetite 0 0 -  Feeling bad or failure about yourself  0 0 -  Trouble concentrating 0 0 -  Moving slowly or fidgety/restless 0 0 -  Suicidal thoughts 0 0 -  PHQ-9 Score 3 1  -  Difficult doing work/chores Not difficult at all Somewhat difficult -    Details       Data saved with a previous flowsheet row definition        Interpretation of Total Score  Total Score Depression Severity:  1-4 = Minimal depression, 5-9 = Mild depression, 10-14 = Moderate depression, 15-19 = Moderately severe depression, 20-27 = Severe depression   Psychosocial Evaluation and Intervention:  Psychosocial Evaluation - 06/22/24 0932       Psychosocial Evaluation & Interventions   Interventions Encouraged to exercise with the program and follow exercise prescription    Comments Leigh denies any psychosocial barriers at this time.    Expected Outcomes For Chandlar to participate in rehab without psychosocial barriers.    Continue Psychosocial Services  No Follow up required          Psychosocial Re-Evaluation:  Psychosocial  Re-Evaluation     Row Name 07/06/24 1034 08/03/24 1252 08/31/24 1559         Psychosocial Re-Evaluation   Current issues with None Identified None Identified None Identified     Comments Monthly psychosocial re-evaluation as follows: Sohil continues to decline any psy/soc barriers or concerns. He states he has great support from his wife and family. Monthly psychosocial re-evaluation as follows: Theophil continues to deny any psy/soc barriers or concerns. Monthly psychosocial re-evaluation as follows: Igor continues to deny any psy/soc barriers or concerns. Piers has had good attendance during the program and is a pleasure to work with.     Expected Outcomes For Nuel to continue attending PR without any psy/soc barriers or concerns. For Kieon to continue attending PR without any psy/soc barriers or concerns. For Akul to continue attending PR without any psy/soc barriers or concerns.     Interventions Encouraged to attend Pulmonary Rehabilitation for the exercise Encouraged to attend Pulmonary Rehabilitation for the exercise Encouraged to attend Pulmonary Rehabilitation for the exercise     Continue Psychosocial Services  No Follow up required No Follow up required No Follow up required        Psychosocial Discharge (Final Psychosocial Re-Evaluation):  Psychosocial Re-Evaluation - 08/31/24 1559       Psychosocial Re-Evaluation   Current issues with None Identified    Comments Monthly psychosocial re-evaluation as follows: Linkon continues to deny any psy/soc barriers or concerns. Jatavis has had good attendance during the program and is a pleasure to work with.    Expected Outcomes For Aidden to continue attending PR without any psy/soc barriers or concerns.    Interventions Encouraged to attend Pulmonary Rehabilitation for the exercise    Continue Psychosocial Services  No Follow up required          Education: Education Goals: Education classes will be provided on a weekly basis, covering required  topics. Participant will state understanding/return demonstration of topics presented.  Learning Barriers/Preferences:  Learning Barriers/Preferences - 06/22/24 0933       Learning Barriers/Preferences   Learning Barriers None    Learning Preferences None          Education Topics: Know Your Numbers Group instruction that is supported by a PowerPoint presentation. Instructor discusses importance of  knowing and understanding resting, exercise, and post-exercise oxygen  saturation, heart rate, and blood pressure. Oxygen  saturation, heart rate, blood pressure, rating of perceived exertion, and dyspnea are reviewed along with a normal range for these values.  Flowsheet Row PULMONARY REHAB OTHER RESPIRATORY from 07/02/2024 in [facility]  Date 07/02/24  Educator EP  Instruction Review Code 1- Verbalizes Understanding    Exercise for the Pulmonary Patient Group instruction that is supported by a PowerPoint presentation. Instructor discusses benefits of exercise, core components of exercise, frequency, duration, and intensity of an exercise routine, importance of utilizing pulse oximetry during exercise, safety while exercising, and options of places to exercise outside of rehab.    MET Level  Group instruction provided by PowerPoint, verbal discussion, and written material to support subject matter. Instructor reviews what METs are and how to increase METs.  Flowsheet Row PULMONARY REHAB OTHER RESPIRATORY from 08/20/2024 in [facility] for Heart, Vascular, & Lung [facility]  Date 08/20/24  Educator EP  Instruction Review Code 1- Verbalizes Understanding    Pulmonary Medications Verbally interactive group education provided by instructor with focus on inhaled medications and proper administration.   Anatomy and Physiology of the Respiratory System Group instruction provided by PowerPoint, verbal discussion,  and written material to support subject matter. Instructor reviews respiratory cycle and anatomical components of the respiratory system and their functions. Instructor also reviews differences in obstructive and restrictive respiratory diseases with examples of each.  Flowsheet Row PULMONARY REHAB OTHER RESPIRATORY from 09/03/2024 in [facility] for Heart, Vascular, & Lung Health  Date 09/03/24  Educator RT  Instruction Review Code 1- Verbalizes Understanding    Oxygen  Safety Group instruction provided by PowerPoint, verbal discussion, and written material to support subject matter. There is an overview of "What is Oxygen " and "Why do we need it".  Instructor also reviews how to create a safe environment for oxygen  use, the importance of using oxygen  as prescribed, and the risks of noncompliance. There is a brief discussion on traveling with oxygen  and resources the patient may utilize. Flowsheet Row PULMONARY REHAB OTHER RESPIRATORY from 07/09/2024 in [facility] for Heart, Vascular, & Lung Health  Date 07/09/24  Educator RN  Instruction Review Code 1- Verbalizes Understanding    Oxygen  Use Group instruction provided by PowerPoint, verbal discussion, and written material to discuss how supplemental oxygen  is prescribed and different types of oxygen  supply systems. Resources for more information are provided.  Flowsheet Row PULMONARY REHAB OTHER RESPIRATORY from 07/16/2024 in [facility] for Heart, Vascular, & Lung Health  Date 07/16/24  Educator RT  Instruction Review Code 1- Verbalizes Understanding    Breathing Techniques Group instruction that is supported by demonstration and informational handouts. Instructor discusses the benefits of pursed lip and diaphragmatic breathing and detailed demonstration on how to perform both.  Flowsheet Row PULMONARY REHAB OTHER RESPIRATORY from 07/23/2024 in [facility] for Heart, Vascular, & Lung Health  Date 07/23/24  Educator RN  Instruction Review Code 1- Verbalizes Understanding     Risk Factor Reduction Group instruction that is supported by a PowerPoint presentation. Instructor discusses the definition of a risk factor, different risk factors for pulmonary disease, and how the heart and lungs work together. Flowsheet Row PULMONARY REHAB OTHER RESPIRATORY from 08/13/2024 in [facility] for Heart, Vascular, & Lung Health  Date 08/13/24  Educator EP  Instruction Review Code 1- Verbalizes Understanding    Pulmonary Diseases Group instruction provided by PowerPoint, verbal discussion, and written material to support subject matter. Instructor gives an overview of the different type of pulmonary diseases. There is also a discussion on risk factors and symptoms as well as ways to manage the diseases. Flowsheet Row PULMONARY REHAB OTHER RESPIRATORY from 08/27/2024 in [facility] for Heart, Vascular, & Lung Health  Date 08/27/24  Educator RT  Instruction Review Code 1- Verbalizes Understanding    Stress and Energy Conservation Group instruction provided by PowerPoint, verbal discussion, and written material to support subject matter. Instructor gives an overview of stress and the impact it can have on the body. Instructor also reviews ways to reduce stress. There is also a discussion on energy conservation and ways to conserve energy throughout the day. Flowsheet Row PULMONARY REHAB OTHER RESPIRATORY from 07/30/2024 in [facility] for Heart, Vascular, & Lung Health  Date 07/30/24  Educator RN  Instruction Review Code 1- Verbalizes Understanding    Warning Signs and Symptoms Group instruction provided by PowerPoint, verbal discussion, and written material to support subject matter. Instructor reviews warning signs and symptoms of stroke, heart attack, cold and flu.  Instructor also reviews ways to prevent the spread of infection. Flowsheet Row PULMONARY REHAB OTHER RESPIRATORY from 08/06/2024 in [facility] for Heart, Vascular, & Lung Health  Date 08/06/24  Educator RN  Instruction Review Code 1- Verbalizes Understanding    Other Education Group or individual verbal, written, or video instructions that support the educational goals of the pulmonary rehab program.    Knowledge Questionnaire Score:  Knowledge Questionnaire Score - 09/01/24 1204       Knowledge Questionnaire Score   Post Score 16/18          Core Components/Risk Factors/Patient Goals at Admission:  Personal Goals and Risk Factors at Admission - 06/22/24 0924       Core Components/Risk Factors/Patient Goals on Admission   Improve shortness of breath with ADL's Yes    Intervention Provide education, individualized exercise plan and daily activity instruction to help decrease symptoms of SOB with activities of daily living.    Expected Outcomes Short Term: Improve cardiorespiratory fitness to achieve a reduction of symptoms when performing ADLs;Long Term: Be able to perform more ADLs without symptoms or delay the onset of symptoms    Increase knowledge of respiratory medications and ability to use respiratory devices properly  Yes    Intervention Provide education and demonstration as needed of appropriate use of medications, inhalers, and oxygen  therapy.    Expected Outcomes Short Term: Achieves understanding of medications use. Understands that oxygen  is a medication prescribed by physician. Demonstrates appropriate use of inhaler and oxygen  therapy.;Long Term: Maintain appropriate use of medications, inhalers, and oxygen  therapy.          Core Components/Risk Factors/Patient Goals Review:   Goals and Risk Factor Review     Row Name 07/06/24 1035 08/03/24 1253 08/31/24 1600         Core Components/Risk Factors/Patient Goals Review   Personal  Goals Review Improve shortness of breath with ADL's;Develop more efficient breathing techniques such as purse lipped breathing and diaphragmatic breathing and practicing self-pacing with activity.;Increase knowledge of respiratory medications and ability to use respiratory devices properly. Improve shortness of breath with ADL's Improve shortness of breath with ADL's     Review Monthly review of patient's Core Components/Risk Factors/Patient Goals are as  follows: Goal in progress for improving his shortness of breath with ADLs. Kenyon is working hard building up his strength and stamina. He is able to maintain his oxygen  saturation on 2L [place]. He is currently exercising on the Nustep and walking the track. Goal met for developing more efficient breathing techniques such as purse lipped breathing and diaphragmatic breathing; and practicing self-pacing with activity. Stryker can perform purse lipped breathing while short of breath. He demonstrated this while performing the warmup and exercising. He can initiate PLB on his own. He works on diaphragmatic breathing at home. Goal met for increasing his knowledge of respiratory medications and the ability to use respiratory devices properly. Our respiratory therapist has worked with Norleen and reviewed medication names, usage, directions, and side effects. All questions were answered, and he understands without assistance. Nesta will continue to benefit from the PR program for exercise and education. Monthly review of patient's Core Components/Risk Factors/Patient Goals are as follows: Goal in progress for improving his shortness of breath with ADLs. Jacquel is working hard building up his strength and stamina. He is able to maintain his oxygen  saturation on 2-3L [place]. He is currently exercising on the Nustep and walking the track. Alam will continue to benefit from the PR program for exercise and education. Monthly review of patient's Core Components/Risk Factors/Patient Goals are as  follows: Goal in progress for improving his shortness of breath with ADLs. Val is working hard building up his strength and stamina. He is able to maintain his oxygen  saturation on 3-4L [place]. He is currently exercising on the Nustep and walking the track. Janziel will continue to benefit from the PR program for exercise and education.     Expected Outcomes Pt will show progress toward meeting expected goals and outcomes. Pt will show progress toward meeting expected goals and outcomes. Pt will show progress toward meeting expected goals and outcomes.        Core Components/Risk Factors/Patient Goals at Discharge (Final Review):   Goals and Risk Factor Review - 08/31/24 1600       Core Components/Risk Factors/Patient Goals Review   Personal Goals Review Improve shortness of breath with ADL's    Review Monthly review of patient's Core Components/Risk Factors/Patient Goals are as follows: Goal in progress for improving his shortness of breath with ADLs. Ramie is working hard building up his strength and stamina. He is able to maintain his oxygen  saturation on 3-4L [place]. He is currently exercising on the Nustep and walking the track. Melody will continue to benefit from the PR program for exercise and education.    Expected Outcomes Pt will show progress toward meeting expected goals and outcomes.          ITP Comments: Pt is making expected progress toward Pulmonary Rehab goals after completing 20 session(s). Recommend continued exercise, life style modification, education, and utilization of breathing techniques to increase stamina and strength, while also decreasing shortness of breath with exertion.  Dr. Slater Staff is Medical Director for Pulmonary Rehab at [facility].

## 2024-09-14 ENCOUNTER — Ambulatory Visit: Admitting: "Endocrinology

## 2024-09-14 DIAGNOSIS — H01005 Unspecified blepharitis left lower eyelid: Secondary | ICD-10-CM | POA: Diagnosis not present

## 2024-09-14 DIAGNOSIS — H401123 Primary open-angle glaucoma, left eye, severe stage: Secondary | ICD-10-CM | POA: Diagnosis not present

## 2024-09-14 DIAGNOSIS — Z961 Presence of intraocular lens: Secondary | ICD-10-CM | POA: Diagnosis not present

## 2024-09-14 DIAGNOSIS — H401111 Primary open-angle glaucoma, right eye, mild stage: Secondary | ICD-10-CM | POA: Diagnosis not present

## 2024-09-14 DIAGNOSIS — H01002 Unspecified blepharitis right lower eyelid: Secondary | ICD-10-CM | POA: Diagnosis not present

## 2024-09-15 ENCOUNTER — Encounter (HOSPITAL_COMMUNITY)
Admission: RE | Admit: 2024-09-15 | Discharge: 2024-09-15 | Disposition: A | Discharge status: Home / Self Care | Source: Ambulatory Visit | Attending: Pulmonary Disease | Admitting: Pulmonary Disease

## 2024-09-15 DIAGNOSIS — J849 Interstitial pulmonary disease, unspecified: Secondary | ICD-10-CM | POA: Insufficient documentation

## 2024-09-15 NOTE — Progress Notes (Signed)
 Daily Session Note  Patient Details  Name: Samuel Porter MRN: [id number] Date of Birth: 04-08-1941 Referring Provider:   [name] Pulmonary Rehab Walk Test from 06/22/2024 in [facility] for Heart, Vascular, & Lung Health  Referring Provider [name]    Encounter Date: 09/15/2024  Check In:  Session Check In - 09/15/24 1100       Check-In   Supervising physician immediately available to respond to emergencies [facility] MD immediately available    Physician(s) Damien Braver, NP    Location [facility]    Staff Present Augustin Sharps, Neita Moats, MS, ACSM-CEP, Exercise Physiologist;Mary Harvy, RN, BSN;Randi Reeve BS, ACSM-CEP, Exercise Physiologist    Virtual Visit No    Medication changes reported     No    Fall or balance concerns reported    No    Tobacco Cessation No Change    Warm-up and Cool-down Performed as group-led instruction    Resistance Training Performed Yes    VAD Patient? No    PAD/SET Patient? No      Pain Assessment   Currently in Pain? No/denies    Multiple Pain Sites No          Capillary Blood Glucose: No results found for this or any previous visit (from the past 24 hours).    Social History   Tobacco Use  Smoking Status Former   Current packs/day: 0.00   Average packs/day: 1.5 packs/day for 10.0 years (15.0 ttl pk-yrs)   Types: Cigarettes   Start date: 97   Quit date: 1970   Years since quitting: 55.9   Passive exposure: Never  Smokeless Tobacco Never    Goals Met:  Proper associated with RPD/PD & O2 Sat [place] with exercise equipment Exercise tolerated well No report of concerns or symptoms today Strength training completed today  Goals Unmet:  Not Applicable  Comments: Service time is from 1001 to 1142.    Dr. Slater Staff is Medical Director for Pulmonary Rehab at [facility]

## 2024-09-17 ENCOUNTER — Encounter (HOSPITAL_COMMUNITY)
Admission: RE | Admit: 2024-09-17 | Discharge: 2024-09-17 | Disposition: A | Source: Ambulatory Visit | Attending: Pulmonary Disease | Admitting: Pulmonary Disease

## 2024-09-17 DIAGNOSIS — J849 Interstitial pulmonary disease, unspecified: Secondary | ICD-10-CM | POA: Diagnosis not present

## 2024-09-17 NOTE — Progress Notes (Signed)
 Daily Session Note  Patient Details  Name: Samuel Porter MRN: [id number] Date of Birth: November 23, 1940 Referring Provider:   [name] Pulmonary Rehab Walk Test from 06/22/2024 in [facility]  Referring Provider [name]    Encounter Date: 09/17/2024  Check In:  Session Check In - 09/17/24 1131       Check-In   Supervising physician immediately available to respond to emergencies [facility] MD immediately available    Physician(s) Rosaline Bane, NP    Location [facility]    Staff Present Augustin Sharps, Neita Moats, MS, ACSM-CEP, Exercise Physiologist;Mary Harvy, RN, BSN;Rajvir Ernster Midge BS, ACSM-CEP, Exercise Physiologist;Carlette Bernett, RN, BSN    Virtual Visit No    Medication changes reported     No    Fall or balance concerns reported    No    Tobacco Cessation No Change    Warm-up and Cool-down Performed as group-led instruction    Resistance Training Performed Yes    VAD Patient? No    PAD/SET Patient? No      Pain Assessment   Currently in Pain? No/denies    Pain Score 0-No pain    Multiple Pain Sites No          Capillary Blood Glucose: No results found for this or any previous visit (from the past 24 hours).    Social History   Tobacco Use  Smoking Status Former   Current packs/day: 0.00   Average packs/day: 1.5 packs/day for 10.0 years (15.0 ttl pk-yrs)   Types: Cigarettes   Start date: 43   Quit date: 1970   Years since quitting: 55.9   Passive exposure: Never  Smokeless Tobacco Never    Goals Met:  Independence with exercise equipment Exercise tolerated well No report of concerns or symptoms today Strength training completed today  Goals Unmet:  Not Applicable  Comments: Pt completed 6 min walk test and graduated today. Service time is from 1025 to 1145.    Dr. Slater Staff is Medical Director for Pulmonary Rehab at [facility]

## 2024-09-18 NOTE — Progress Notes (Signed)
 Discharge Progress Report  Patient Details  Name: Samuel Porter MRN: [id number] Date of Birth: 28-Apr-1941 Referring Provider:   [name] Pulmonary Rehab Walk Test from 06/22/2024 in [facility]  Referring Provider [name]     Number of Visits: 22  Reason for Discharge:  Patient has met program and personal goals.  Smoking History:  Social History   Tobacco Use  Smoking Status Former   Current packs/day: 0.00   Average packs/day: 1.5 packs/day for 10.0 years (15.0 ttl pk-yrs)   Types: Cigarettes   Start date: 49   Quit date: 36   Years since quitting: 55.9   Passive exposure: Never  Smokeless Tobacco Never    Diagnosis:  ILD (interstitial lung disease) (HCC)  ADL [facility]:  Pulmonary Assessment Scores     Row Name 06/22/24 0920 09/01/24 1204 09/17/24 1552     ADL [facility]   ADL Phase Entry Exit --   SOB Score total 32 30 --     CAT Score   CAT Score 2 6 --     mMRC Score   mMRC Score 2 -- 2      Initial Exercise Prescription:  Initial Exercise Prescription - 06/22/24 1000       Date of Initial Exercise RX and Referring Provider   Date 06/22/24    Referring Provider [name]    Expected Discharge Date 09/17/24      Oxygen    Oxygen  Continuous    Liters 2    Maintain Oxygen  Saturation 88% or higher      NuStep   Level 1    SPM 58    Minutes 15    METs 1.43      Track   Minutes 15    METs 1.7      Prescription Details   Frequency (times per week) 2    Duration Progress to 30 minutes of continuous aerobic without signs/symptoms of physical distress      Intensity   THRR 40-80% of Max Heartrate 55-110    Ratings of Perceived Exertion 11-13    Perceived Dyspnea 0-4      Progression   Progression Continue to progress workloads to maintain intensity without signs/symptoms of physical distress.      Resistance Training   Training Prescription Yes    Weight red bands    Reps  10-15          Discharge Exercise Prescription (Final Exercise Prescription Changes):  Exercise Prescription Changes - 09/08/24 1200       Response to Exercise   Blood Pressure (Admit) 130/86    Blood Pressure (Exercise) 120/66    Blood Pressure (Exit) 116/58    Heart Rate (Admit) 43 bpm    Heart Rate (Exercise) 56 bpm    Heart Rate (Exit) 46 bpm    Oxygen  Saturation (Admit) 98 %   3L   Oxygen  Saturation (Exercise) 89 %   4L   Oxygen  Saturation (Exit) 97 %   3L POC   Rating of Perceived Exertion (Exercise) 12    Perceived Dyspnea (Exercise) 1    Duration Continue with 30 min of aerobic exercise without signs/symptoms of physical distress.    Intensity THRR unchanged      Progression   Progression Continue to progress workloads to maintain intensity without signs/symptoms of physical distress.      Resistance Training   Training Prescription Yes    Weight red bands  Reps 10-15    Time 10 Minutes      Oxygen    Oxygen  Continuous    Liters 3-4      NuStep   Level 4    SPM 78    Minutes 15    METs 2.3      Track   Laps 7    Minutes 15    METs 2.08      Oxygen    Maintain Oxygen  Saturation 88% or higher          Functional Capacity:  6 Minute Walk     Row Name 06/22/24 1026 09/17/24 1537       6 Minute Walk   Phase Initial Discharge    Distance 770 feet 982 feet    Distance % Change -- 27.53 %    Distance Feet Change -- 212 ft    Walk Time 6 minutes 6 minutes    # of Rest Breaks 0 0    MPH 1.46 1.86    METS 1.65 1.92    RPE 11 11    Perceived Dyspnea  1 1    VO2 Peak 5.76 6.72    Symptoms No No    Resting HR 50 bpm 49 bpm    Resting BP 132/68 132/60    Resting Oxygen  Saturation  99 % 93 %    Exercise Oxygen  Saturation  during 6 min walk 97 % 90 %    Max Ex. HR 69 bpm 65 bpm    Max Ex. BP 150/60 138/68    2 Minute Post BP 128/66 122/70      Interval HR   1 Minute HR 64 51    2 Minute HR 65 57    3 Minute HR 66 56    4 Minute HR 69 60     5 Minute HR 69 65    6 Minute HR 69 58    2 Minute Post HR 66 53    Interval Heart Rate? Yes Yes      Interval Oxygen    Interval Oxygen ? Yes Yes    Baseline Oxygen  Saturation % 99 % 93 %    1 Minute Oxygen  Saturation % 100 % 97 %    1 Minute Liters of Oxygen  3 L 3 L    2 Minute Oxygen  Saturation % 99 % 94 %    2 Minute Liters of Oxygen  3 L 3 L    3 Minute Oxygen  Saturation % 98 % 91 %    3 Minute Liters of Oxygen  3 L 3 L    4 Minute Oxygen  Saturation % 99 % 96 %    4 Minute Liters of Oxygen  3 L 3 L    5 Minute Oxygen  Saturation % 97 % 95 %    5 Minute Liters of Oxygen  3 L 3 L    6 Minute Oxygen  Saturation % 97 % 90 %    6 Minute Liters of Oxygen  3 L 3 L    2 Minute Post Oxygen  Saturation % 97 % 94 %    2 Minute Post Liters of Oxygen  3 L 3 L       Psychological, QOL, Others - Outcomes: PHQ 2/9:    09/01/2024   12:04 PM 06/22/2024    9:18 AM 11/30/2022    8:12 AM  Depression screen PHQ 2/9  Decreased Interest 1 0 0  Down, Depressed, Hopeless 0 0 0  PHQ - 2 Score 1 0 0  Altered  sleeping 1 0   Tired, decreased energy 1 1   Change in appetite 0 0   Feeling bad or failure about yourself  0 0   Trouble concentrating 0 0   Moving slowly or fidgety/restless 0 0   Suicidal thoughts 0 0   PHQ-9 Score 3 1    Difficult doing work/chores Not difficult at all Somewhat difficult      Data saved with a previous flowsheet row definition    Quality of Life:   Personal Goals: Goals established at orientation with interventions provided to work toward goal.  Personal Goals and Risk Factors at Admission - 06/22/24 0924       Core Components/Risk Factors/Patient Goals on Admission   Improve shortness of breath with ADL's Yes    Intervention Provide education, individualized exercise plan and daily activity instruction to help decrease symptoms of SOB with activities of daily living.    Expected Outcomes Short Term: Improve cardiorespiratory fitness to achieve a reduction of  symptoms when performing ADLs;Long Term: Be able to perform more ADLs without symptoms or delay the onset of symptoms    Increase knowledge of respiratory medications and ability to use respiratory devices properly  Yes    Intervention Provide education and demonstration as needed of appropriate use of medications, inhalers, and oxygen  therapy.    Expected Outcomes Short Term: Achieves understanding of medications use. Understands that oxygen  is a medication prescribed by physician. Demonstrates appropriate use of inhaler and oxygen  therapy.;Long Term: Maintain appropriate use of medications, inhalers, and oxygen  therapy.           Personal Goals Discharge:  Goals and Risk Factor Review     Row Name 07/06/24 1035 08/03/24 1253 08/31/24 1600 09/18/24 0934       Core Components/Risk Factors/Patient Goals Review   Personal Goals Review Improve shortness of breath with ADL's;Develop more efficient breathing techniques such as purse lipped breathing and diaphragmatic breathing and practicing self-pacing with activity.;Increase knowledge of respiratory medications and ability to use respiratory devices properly. Improve shortness of breath with ADL's Improve shortness of breath with ADL's Improve shortness of breath with ADL's    Review Monthly review of patient's Core Components/Risk Factors/Patient Goals are as follows: Goal in progress for improving his shortness of breath with ADLs. Natale is working hard building up his strength and stamina. He is able to maintain his oxygen  saturation on 2L [place]. He is currently exercising on the Nustep and walking the track. Goal met for developing more efficient breathing techniques such as purse lipped breathing and diaphragmatic breathing; and practicing self-pacing with activity. Sherron can perform purse lipped breathing while short of breath. He demonstrated this while performing the warmup and exercising. He can initiate PLB on his own. He works on diaphragmatic  breathing at home. Goal met for increasing his knowledge of respiratory medications and the ability to use respiratory devices properly. Our respiratory therapist has worked with Norleen and reviewed medication names, usage, directions, and side effects. All questions were answered, and he understands without assistance. Gaje will continue to benefit from the PR program for exercise and education. Monthly review of patient's Core Components/Risk Factors/Patient Goals are as follows: Goal in progress for improving his shortness of breath with ADLs. Mateen is working hard building up his strength and stamina. He is able to maintain his oxygen  saturation on 2-3L [place]. He is currently exercising on the Nustep and walking the track. Sanchez will continue to benefit from the PR program for exercise  and education. Monthly review of patient's Core Components/Risk Factors/Patient Goals are as follows: Goal in progress for improving his shortness of breath with ADLs. Braden is working hard building up his strength and stamina. He is able to maintain his oxygen  saturation on 3-4L [place]. He is currently exercising on the Nustep and walking the track. Raymone will continue to benefit from the PR program for exercise and education. Edilberto graduated from the [employer] on 09/17/24. He met his goal for improving shortness of breath with ADL's. He was able to increase his workloads and MET's during the program. His shortness of breath score decreased from 32 to 30. Nikan did great during the program and we wish him the best.    Expected Outcomes Pt will show progress toward meeting expected goals and outcomes. Pt will show progress toward meeting expected goals and outcomes. Pt will show progress toward meeting expected goals and outcomes. To continue to exercise and modify his nutrition and lifestyle post graduation       Exercise Goals and Review:  Exercise Goals     Row Name 06/22/24 0920             Exercise Goals   Increase Physical  Activity Yes       Intervention Provide advice, education, support and counseling about physical activity/exercise needs.;Develop an individualized exercise prescription for aerobic and resistive training based on initial evaluation findings, risk stratification, comorbidities and participant's personal goals.       Expected Outcomes Short Term: Attend rehab on a regular basis to increase amount of physical activity.;Long Term: Add in home exercise to make exercise part of routine and to increase amount of physical activity.;Long Term: Exercising regularly at least 3-5 days a week.       Increase Strength and Stamina Yes       Intervention Provide advice, education, support and counseling about physical activity/exercise needs.;Develop an individualized exercise prescription for aerobic and resistive training based on initial evaluation findings, risk stratification, comorbidities and participant's personal goals.       Expected Outcomes Short Term: Increase workloads from initial exercise prescription for resistance, speed, and METs.;Short Term: Perform resistance training exercises routinely during rehab and add in resistance training at home;Long Term: Improve cardiorespiratory fitness, muscular endurance and strength as measured by increased METs and functional capacity ( )       Able to understand and use rate of perceived exertion (RPE) scale Yes       Intervention Provide education and explanation on how to use RPE scale       Expected Outcomes Short Term: Able to use RPE daily in rehab to express subjective intensity level;Long Term:  Able to use RPE to guide intensity level when exercising independently       Able to understand and use Dyspnea scale Yes       Intervention Provide education and explanation on how to use Dyspnea scale       Expected Outcomes Short Term: Able to use Dyspnea scale daily in rehab to express subjective sense of shortness of breath during exertion;Long Term: Able to  use Dyspnea scale to guide intensity level when exercising independently       Knowledge and understanding of Target Heart Rate Range (THRR) Yes       Intervention Provide education and explanation of THRR including how the numbers were predicted and where they are located for reference       Expected Outcomes Short Term: Able to state/look up THRR;Long  Term: Able to use THRR to govern intensity when exercising independently;Short Term: Able to use daily as guideline for intensity in rehab       Understanding of Exercise Prescription Yes       Intervention Provide education, explanation, and written materials on patient's individual exercise prescription       Expected Outcomes Short Term: Able to explain program exercise prescription;Long Term: Able to explain home exercise prescription to exercise independently          Exercise Goals Re-Evaluation:  Exercise Goals Re-Evaluation     Row Name 07/03/24 1102 07/31/24 0921 09/02/24 1113         Exercise Goal Re-Evaluation   Exercise Goals Review Increase Physical Activity;Able to understand and use Dyspnea scale;Understanding of Exercise Prescription;Increase Strength and Stamina;Knowledge and understanding of Target Heart Rate Range (THRR);Able to understand and use rate of perceived exertion (RPE) scale Increase Physical Activity;Able to understand and use Dyspnea scale;Understanding of Exercise Prescription;Increase Strength and Stamina;Knowledge and understanding of Target Heart Rate Range (THRR);Able to understand and use rate of perceived exertion (RPE) scale Increase Physical Activity;Able to understand and use Dyspnea scale;Understanding of Exercise Prescription;Increase Strength and Stamina;Knowledge and understanding of Target Heart Rate Range (THRR);Able to understand and use rate of perceived exertion (RPE) scale     Comments Gerald has completed 2 exercise sessions. He exercises for 15 min on the Nustep and track. He averages 2.3 METs at  level 2 on the Nustep and 1.92 METs on the track. He performs the warmup and cooldown standing without limitations. It is too soon to notate any discernable progressions. Will continue to monitor and progress as able. Koleson has completed 10 exercise sessions. He exercises for 15 min on the Nustep and track. He averages 2.3 METs at level 3 on the Nustep and 1.92 METs on the track. He performs the warmup and cooldown standing without limitations. Shaya has increased his level on the Nustep as METs are relatively the same. His track laps remain the same. Raynold is difficult to progress due to his deconditioned state. Will continue to monitor and progress as able. Jayden has completed 10 exercise sessions. He exercises for 15 min on the Nustep and track. He averages 2.4 METs at level 4 on the Nustep and 2.08 METs on the track. He performs the warmup and cooldown standing holding onto a rollator for balance. He has increased his level on the Nustep. He tolerates this increase well. We have discussed home exercise as Genaro is exercising at home. He seems motivated to continue exercise.     Expected Outcomes Through exercise at rehab and home, the patient will decrease shortness of breath with daily activities and feel confident in carrying out an exercise regimen at home. Through exercise at rehab and home, the patient will decrease shortness of breath with daily activities and feel confident in carrying out an exercise regimen at home. Through exercise at rehab and home, the patient will decrease shortness of breath with daily activities and feel confident in carrying out an exercise regimen at home.        Nutrition & Weight - Outcomes:    Nutrition:  Nutrition Therapy & Goals - 08/18/24 1252       Nutrition Therapy   Diet Regular diet- soft/moist foods      Personal Nutrition Goals   Nutrition Goal Patient to identify strategies for weight gain of 0.5-2# per week.    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 Pt with underweight  based on BMI for >= 65 years. 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 Attempting to increase caloric intake with protein shake such as Chobani, [employer] and Boost.      Intervention Plan   Intervention Prescribe, educate and counsel regarding individualized specific dietary modifications aiming towards targeted core components such as weight, hypertension, lipid management, diabetes, heart failure and other comorbidities.;Nutrition handout(s) given to patient.   Weight Gain & Weight Maintenance   Expected Outcomes Short Term Goal: Understand basic principles of dietary content, such as calories, fat, sodium, cholesterol and nutrients.;Long Term Goal: Adherence to prescribed nutrition plan.          Nutrition Discharge:   Education Questionnaire Score:  Knowledge Questionnaire Score - 09/01/24 1204       Knowledge Questionnaire Score   Post Score 16/18          Goals reviewed with patient; copy given to patient.

## 2024-09-22 DIAGNOSIS — E039 Hypothyroidism, unspecified: Secondary | ICD-10-CM | POA: Diagnosis not present

## 2024-09-23 ENCOUNTER — Ambulatory Visit: Admitting: Pulmonary Disease

## 2024-09-24 ENCOUNTER — Other Ambulatory Visit

## 2024-09-24 DIAGNOSIS — E039 Hypothyroidism, unspecified: Secondary | ICD-10-CM | POA: Diagnosis not present

## 2024-09-25 LAB — TSH: TSH: 33.73 m[IU]/L — ABNORMAL HIGH (ref 0.40–4.50)

## 2024-09-25 LAB — T4, FREE: Free T4: 1.1 ng/dL (ref 0.8–1.8)

## 2024-09-26 DIAGNOSIS — I5020 Unspecified systolic (congestive) heart failure: Secondary | ICD-10-CM | POA: Diagnosis not present

## 2024-09-30 ENCOUNTER — Ambulatory Visit: Admitting: "Endocrinology

## 2024-09-30 ENCOUNTER — Ambulatory Visit: Admitting: Pulmonary Disease

## 2024-09-30 ENCOUNTER — Encounter: Payer: Self-pay | Admitting: "Endocrinology

## 2024-09-30 ENCOUNTER — Encounter: Payer: Self-pay | Admitting: Pulmonary Disease

## 2024-09-30 VITALS — BP 128/70 | HR 63 | Ht 73.0 in | Wt 153.2 lb

## 2024-09-30 VITALS — BP 100/60 | HR 88 | Ht 73.0 in | Wt 153.0 lb

## 2024-09-30 DIAGNOSIS — J849 Interstitial pulmonary disease, unspecified: Secondary | ICD-10-CM | POA: Diagnosis not present

## 2024-09-30 DIAGNOSIS — J9611 Chronic respiratory failure with hypoxia: Secondary | ICD-10-CM

## 2024-09-30 DIAGNOSIS — E039 Hypothyroidism, unspecified: Secondary | ICD-10-CM

## 2024-09-30 DIAGNOSIS — E8779 Other fluid overload: Secondary | ICD-10-CM | POA: Diagnosis not present

## 2024-09-30 MED ORDER — LEVOTHYROXINE SODIUM 100 MCG PO TABS: 100.0000 ug | ORAL_TABLET | Freq: Every day | ORAL | 1 refills | Status: AC

## 2024-09-30 NOTE — Patient Instructions (Signed)
 Nice to see you again  We will repeat a CT scan soon to get a better picture of the lungs now that the fluid buildup appears improved  We will get pulmonary function test sometime in the spring before you see me so we can discuss your lung capacity and compliance in more detail  Return to clinic in 3 months after pulmonary function test with Dr. Annella

## 2024-09-30 NOTE — Progress Notes (Signed)
 Outpatient Endocrinology Note Samuel Birmingham, MD  09/30/2024   Samuel Porter 10-09-1941 [id number]  Referring Provider: Kip Righter, MD Primary Care Provider: Kip Righter, MD Subjective  No chief complaint on file.   Assessment & Plan  Diagnoses and all orders for this visit:  Acquired hypothyroidism -     TSH + free T4  Other orders -     levothyroxine  (SYNTHROID ) 100 MCG tablet; Take 1 tablet (100 mcg total) by mouth daily.   Samuel Porter is currently taking levothyroxine  88mcg po every day appropriately.  He is on amiodarone  and was on immunotherapy but none of them led to hypothyroidism right away so the cause-and-effect is not established. Patient is currently biochemically hypothyroid. Educated on thyroid  axis.  Recommend the following: Take levothyroxine  100 mcg every morning. Will gradually raise the dose to avoid over-pacing the heart with a higher dose change. Advised to take levothyroxine  first thing in the morning on empty stomach and wait at least 30 minutes to 1 hour before eating or drinking anything or taking any other medications. Space out levothyroxine  by 4 hours from any acid reflux medication/fibrate/iron/calcium/multivitamin. Advised to take birth control pills and nutritional supplements in the evening. Repeat lab before next visit or sooner if symptoms of hyperthyroidism or hypothyroidism develop.  Notify us  immediately in case of significant weight gain or loss. Counseled on compliance and follow up needs.  I have reviewed current medications, nurse's notes, allergies, vital signs, past medical and surgical history, family medical history, and social history for this encounter. Counseled patient on symptoms, examination findings, lab findings, imaging results, treatment decisions and monitoring and prognosis. The patient understood the recommendations and agrees with the treatment plan. All questions regarding treatment plan were fully  answered.   Return in about 2 months (around 12/01/2024) for visit + labs before next visit.   Samuel Birmingham, MD  09/30/2024   I have reviewed current medications, nurse's notes, allergies, vital signs, past medical and surgical history, family medical history, and social history for this encounter. Counseled patient on symptoms, examination findings, lab findings, imaging results, treatment decisions and monitoring and prognosis. The patient understood the recommendations and agrees with the treatment plan. All questions regarding treatment plan were fully answered.   History of Present Illness Samuel Porter is a 83 y.o. year old male who presents to our clinic with hypothyroidism diagnosed in 03/2024.    He is on amiodarone  200 mg every day for A.fib.  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   Physical Exam  BP 100/60   Pulse 88   Ht 6' 1 (1.854 m)   Wt 153 lb (69.4 kg)   SpO2 90%   BMI 20.19 kg/m  Constitutional: well developed, well nourished Head: normocephalic, atraumatic, no exophthalmos Eyes: sclera anicteric, no redness Neck: no thyromegaly, no thyroid  tenderness; no nodules palpated Lungs: normal respiratory effort Neurology: alert and oriented, no fine hand tremor Skin: dry, no appreciable rashes Musculoskeletal: no appreciable defects Psychiatric: normal mood and affect  Allergies No Known Allergies  Current Medications Patient's  Medications  New Prescriptions   LEVOTHYROXINE  (SYNTHROID ) 100 MCG TABLET    Take 1 tablet (100 mcg total) by mouth  daily.  Previous Medications   ACETAMINOPHEN  (TYLENOL ) 325 MG TABLET    Take 650 mg by mouth daily as needed for moderate pain (pain score 4-6) or mild pain (pain score 1-3).   ALBUTEROL  (VENTOLIN  HFA) 108 (90 BASE) MCG/ACT INHALER    Inhale 2 puffs into the lungs every 4 (four) hours as needed for shortness of breath.   AMIODARONE  (PACERONE ) 200 MG TABLET    Take 1 tablet (200 mg total) by mouth daily.   CHOLECALCIFEROL  (VITAMIN D ) 1000 UNITS TABLET    Take 1,000 Units by mouth daily.   DAPAGLIFLOZIN  PROPANEDIOL (FARXIGA ) 10 MG TABS TABLET    Take 1 tablet (10 mg total) by mouth daily before breakfast.   DOCUSATE SODIUM  (COLACE) 100 MG CAPSULE    Take 1 capsule (100 mg total) by mouth 2 (two) times daily as needed for mild constipation.   DORZOLAMIDE -TIMOLOL  (COSOPT ) 22.3-6.8 MG/ML OPHTHALMIC SOLUTION    Place 1 drop into both eyes 2 (two) times daily.   ELIQUIS  5 MG TABS TABLET    Take 5 mg by mouth 2 (two) times daily.   FEEDING SUPPLEMENT (BOOST HIGH PROTEIN) LIQD    Take 1 Container by mouth daily.   FEEDING SUPPLEMENT (ENSURE PLUS HIGH PROTEIN) LIQD    Take 237 mLs by mouth 3 (three) times daily between meals.   FINASTERIDE  (PROSCAR ) 5 MG TABLET    Take 5 mg by mouth daily.   FUROSEMIDE  (LASIX ) 20 MG TABLET    Take 1 tablet (20 mg total) by mouth daily.   MIRTAZAPINE  (REMERON ) 7.5 MG TABLET    Take 1 tablet (7.5 mg total) by mouth at bedtime.   MULTIPLE VITAMIN (MULTIVITAMIN WITH MINERALS) TABS TABLET    Take 1 tablet by mouth daily.   NUTRITIONAL SUPPLEMENTS (,FEEDING SUPPLEMENT, PROSOURCE PLUS) LIQUID    Take 30 mLs by mouth 2 (two) times daily before lunch and supper.   POLYETHYLENE GLYCOL (MIRALAX  / GLYCOLAX ) 17 G PACKET    Take 17 g by mouth 2 (two) times daily as needed.   SILDENAFIL (VIAGRA) 25 MG TABLET    Take 25 mg by mouth as needed for erectile dysfunction.  Modified Medications   No medications on file  Discontinued Medications   LEVOTHYROXINE  (SYNTHROID ) 88 MCG TABLET    88  mcg.    Past Medical History Past Medical History:  Diagnosis Date   Arthritis    oa   BPH (benign prostatic hyperplasia)    Cancer (HCC) 1 yrs ago   melanoma removed right elbow   Cataracts, bilateral    Glaucoma    Hypertension    Vitamin D  deficiency     Past Surgical History Past Surgical History:  Procedure Laterality Date   CARDIOVERSION N/A 03/05/2024   Procedure: CARDIOVERSION;  Surgeon: Mona Vinie BROCKS, MD;  Location: [facility] INVASIVE CV LAB;  Service: Cardiovascular;  Laterality: N/A;   CARDIOVERSION N/A 03/24/2024   Procedure: CARDIOVERSION;  Surgeon: Rolan Ezra RAMAN, MD;  Location: [facility] INVASIVE CV LAB;  Service: Cardiovascular;  Laterality: N/A;   CARDIOVERSION N/A 04/24/2024   Procedure: CARDIOVERSION;  Surgeon: Okey Vina GAILS, MD;  Location: [facility] INVASIVE CV LAB;  Service: Cardiovascular;  Laterality: N/A;   CHEST TUBE INSERTION Right 03/18/2024   Procedure: CHEST TUBE INSERTION;  Surgeon: Annella Donnice SAUNDERS, MD;  Location: [facility] ENDOSCOPY;  Service: Pulmonary;  Laterality:  Right;   CHOLECYSTECTOMY     colonscopy  2017   IR THORACENTESIS ASP PLEURAL SPACE W/IMG GUIDE  03/03/2024   JOINT REPLACEMENT Right    hip    RIGHT HEART CATH N/A 03/23/2024   Procedure: RIGHT HEART CATH;  Surgeon: Rolan Ezra RAMAN, MD;  Location: [facility] INVASIVE CV LAB;  Service: Cardiovascular;  Laterality: N/A;   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   Procedure: LEFT TOTAL HIP ARTHROPLASTY ANTERIOR APPROACH;  Surgeon: Dempsey Moan, MD;  Location: [facility] ORS;  Service: Orthopedics;  Laterality: Left;   VENTRAL HERNIA REPAIR N/A 07/12/2023   Procedure: HERNIA REPAIR VENTRAL;  Surgeon: Lyndel Deward PARAS, MD;  Location: [facility] OR;  Service: General;  Laterality: N/A;    Family History family history includes Cancer in his father and mother; Cancer - Ovarian in his sister; Healthy in his daughter, daughter, daughter, and son; Heart attack in his brother and brother; Lymphoma in his sister.  Social History Social  History   Socioeconomic History   Marital status: Married    Spouse name: Not on file   Number of children: Not on file   Years of education: Not on file   Highest education level: Not on file  Occupational History   Occupation: Retired  Tobacco Use   Smoking status: Former    Current packs/day: 0.00    Average packs/day: 1.5 packs/day for 10.0 years (15.0 ttl pk-yrs)    Types: Cigarettes    Start date: 74    Quit date: 1970    Years since quitting: 55.9    Passive exposure: Never   Smokeless tobacco: Never  Vaping Use   Vaping status: Never Used  Substance and Sexual Activity   Alcohol use: Not Currently    Alcohol/week: 2.0 standard drinks of alcohol    Types: 2 Glasses of wine per week    Comment: 1-2 glasses of wine   Drug use: No   Sexual activity: Not on file  Other Topics Concern   Not on file  Social History Narrative   Not on file   Social Drivers of Health   Tobacco Use: Medium Risk (09/30/2024)   Patient History    Smoking Tobacco Use: Former    Smokeless Tobacco Use: Never    Passive Exposure: Never  Physicist, Medical Strain: Not on file  Food Insecurity: Patient Declined (03/17/2024)   Hunger Vital Sign    Worried About Running Out of Food in the Last Year: Patient declined    Ran Out of Food in the Last Year: Patient declined  Transportation Needs: No Transportation Needs (02/04/2024)   Received from [facility]   PRAPARE - Transportation    Lack of Transportation (Medical): No    Lack of Transportation (Non-Medical): No  Physical Activity: Not on file  Stress: Not on file  Social Connections: Not on file  Intimate Partner Violence: Not At Risk (11/30/2022)   Humiliation, Afraid, Rape, and Kick questionnaire    Fear of Current or Ex-Partner: No    Emotionally Abused: No    Physically Abused: No    Sexually Abused: No  Depression (PHQ2-9): Low Risk (09/01/2024)   Depression (PHQ2-9)    PHQ-2 Score: 3  Alcohol Screen: Not on file   Housing: Low Risk (11/30/2022)   Housing    Last Housing Risk Score: 0  Utilities: Low Risk (02/04/2024)   Received from [facility]   Utilities    Within the past 12 months, have you been unable to  get utilities(heat, electricity) when it was really needed?: No  Health Literacy: Not on file    Laboratory Investigations Lab Results  Component Value Date   TSH 33.73 (H) 09/24/2024   TSH 31.098 (H) 05/11/2024   TSH 27.131 (H) 03/17/2024   FREET4 1.1 09/24/2024   FREET4 0.78 05/11/2024   FREET4 0.71 03/17/2024     No results found for: TSI   No components found for: TRAB   No results found for: CHOL No results found for: HDL No results found for: LDLCALC No results found for: TRIG No results found for: [facility] Lab Results  Component Value Date   CREATININE 1.21 08/19/2024   No results found for: GFR    Component Value Date/Time   NA 139 08/19/2024 1112   K 4.2 08/19/2024 1112   CL 100 08/19/2024 1112   CO2 26 08/19/2024 1112   GLUCOSE 120 (H) 08/19/2024 1112   GLUCOSE 125 (H) 06/02/2024 1600   BUN 23 08/19/2024 1112   CREATININE 1.21 08/19/2024 1112   CREATININE 1.02 05/12/2024 1253   CALCIUM 8.8 08/19/2024 1112   PROT 6.8 08/19/2024 1112   ALBUMIN  3.1 (L) 08/19/2024 1112   AST 20 08/19/2024 1112   AST 25 05/12/2024 1253   ALT 14 08/19/2024 1112   ALT 19 05/12/2024 1253   ALKPHOS 92 08/19/2024 1112   BILITOT 0.7 08/19/2024 1112   BILITOT 0.7 05/12/2024 1253   GFRNONAA >60 06/02/2024 1600   GFRNONAA >60 05/12/2024 1253   GFRAA >60 06/17/2018 0457      Latest Ref Rng & Units 08/19/2024   11:12 AM 06/02/2024    4:00 PM 05/12/2024   12:53 PM  BMP  Glucose 70 - 99 mg/dL 879  874  895   BUN 8 - 27 mg/dL 23  27  28    Creatinine 0.76 - 1.27 mg/dL 8.78  9.05  8.97   BUN/Creat Ratio 10 - 24 19     Sodium 134 - 144 mmol/L 139  138  138   Potassium 3.5 - 5.2 mmol/L 4.2  4.1  4.1   Chloride 96 - 106 mmol/L 100  100  100   CO2 20 - 29 mmol/L 26   30  36   Calcium 8.6 - 10.2 mg/dL 8.8  9.5  8.9        Component Value Date/Time   WBC 4.2 06/02/2024 1600   RBC 4.30 06/02/2024 1600   HGB 12.4 (L) 06/02/2024 1600   HGB 12.0 (L) 04/13/2024 1245   HCT 40.9 06/02/2024 1600   HCT 37.8 04/13/2024 1245   PLT 152 06/02/2024 1600   PLT 191 04/13/2024 1245   MCV 95.1 06/02/2024 1600   MCV 90 04/13/2024 1245   MCH 28.8 06/02/2024 1600   MCHC 30.3 06/02/2024 1600   RDW 15.6 (H) 06/02/2024 1600   RDW 14.2 04/13/2024 1245   LYMPHSABS 0.6 (L) 05/29/2024 0740   MONOABS 0.9 05/29/2024 0740   EOSABS 0.1 05/29/2024 0740   BASOSABS 0.0 05/29/2024 0740      Parts of this note may have been dictated using voice recognition software. There may be variances in spelling and vocabulary which are unintentional. Not all errors are proofread. Please notify the dino if any discrepancies are noted or if the meaning of any statement is not clear.

## 2024-09-30 NOTE — Progress Notes (Signed)
 @Patient  ID: Samuel Porter, male    DOB: August 14, 1941, 83 y.o.   MRN: [id number]  Chief Complaint  Patient presents with   Medical Management of Chronic Issues    Pt states all is well     Referring provider: Kip Righter, MD  HPI:   83 y.o. 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  Overall, prednisone  did seem to help.  Morbid officials from pulmonary rehab.  Completed recently.  Increase in walking today.  Less dyspneic.  Able to do more.  Requiring 2 to 3 L with exertion.  At rest they check his numbers off oxygen  and his numbers seem to be fine, staying above 90.  We discussed repeating a CT scan to get better baseline since prior imaging was done with fluid overload.  Questionaires / Pulmonary Flowsheets:   ACT:      No data to display          MMRC:     No data to display          Epworth:      No data to display          Tests:   FENO:  No results found for: NITRICOXIDE  PFT:     No data to display          WALK:     09/17/2024    3:37 PM 06/22/2024   10:26 AM  SIX MIN WALK  2 Minute Oxygen  Saturation % 94 % 99 %  2 Minute HR 57 65  4 Minute Oxygen  Saturation % 96 % 99 %  4 Minute HR 60 69  6 Minute Oxygen  Saturation % 90 % 97 %  6 Minute HR 58 69    Imaging: Personally reviewed No results found.   Lab Results: Personally reviewed CBC    Component Value Date/Time   WBC 4.2 06/02/2024 1600   RBC 4.30 06/02/2024 1600   HGB 12.4 (L) 06/02/2024 1600   HGB 12.0 (L) 04/13/2024 1245   HCT 40.9 06/02/2024 1600   HCT 37.8 04/13/2024 1245   PLT 152 06/02/2024 1600   PLT 191 04/13/2024 1245   MCV 95.1 06/02/2024 1600   MCV 90 04/13/2024 1245   MCH 28.8 06/02/2024 1600   MCHC 30.3 06/02/2024 1600   RDW 15.6 (H)  06/02/2024 1600   RDW 14.2 04/13/2024 1245   LYMPHSABS 0.6 (L) 05/29/2024 0740   MONOABS 0.9 05/29/2024 0740   EOSABS 0.1 05/29/2024 0740   BASOSABS 0.0 05/29/2024 0740    BMET    Component Value Date/Time   NA 139 08/19/2024 1112   K 4.2 08/19/2024 1112   CL 100 08/19/2024 1112   CO2 26 08/19/2024 1112   GLUCOSE 120 (H) 08/19/2024 1112   GLUCOSE 125 (H) 06/02/2024 1600   BUN 23 08/19/2024 1112   CREATININE 1.21 08/19/2024 1112   CREATININE 1.02 05/12/2024 1253   CALCIUM 8.8 08/19/2024 1112   GFRNONAA >60 06/02/2024 1600   GFRNONAA >60 05/12/2024 1253   GFRAA >60 06/17/2018 0457    BNP    Component Value Date/Time   BNP 1,089.6 (H) 03/21/2024 0548    ProBNP    Component Value Date/Time   PROBNP 3,437.0 (H) 06/02/2024 1609    Specialty Problems       Pulmonary Problems  Acute respiratory failure (HCC)   Pleural effusion   Pleural effusion on right   Recurrent right pleural effusion   ILD (interstitial lung disease) (HCC)    No Known Allergies  Immunization History  Administered Date(s) Administered   Influenza-Unspecified 08/16/2023   PFIZER Comirnaty (Gray Top)Covid-19 Tri-Sucrose Vaccine 05/05/2021   PFIZER(Purple Top)SARS-COV-2 Vaccination 11/04/2019, 11/22/2019, 10/01/2020   Pfizer(Comirnaty )Fall Seasonal Vaccine 83 years and older 07/16/2024    Past Medical History:  Diagnosis Date   Arthritis    oa   BPH (benign prostatic hyperplasia)    Cancer (HCC) 1 yrs ago   melanoma removed right elbow   Cataracts, bilateral    Glaucoma    Hypertension    Vitamin D  deficiency     Tobacco History: Social History   Tobacco Use  Smoking Status Former   Current packs/day: 0.00   Average packs/day: 1.5 packs/day for 10.0 years (15.0 ttl pk-yrs)   Types: Cigarettes   Start date: 55   Quit date: 8   Years since quitting: 55.9   Passive exposure: Never  Smokeless Tobacco Never   Counseling given: Not Answered   Continue to not  smoke  Outpatient Encounter Medications as of 09/30/2024  Medication Sig   acetaminophen  (TYLENOL ) 325 MG tablet Take 650 mg by mouth daily as needed for moderate pain (pain score 4-6) or mild pain (pain score 1-3). (Patient taking differently: Take 650 mg by mouth as needed for moderate pain (pain score 4-6) or mild pain (pain score 1-3).)   albuterol  (VENTOLIN  HFA) 108 (90 Base) MCG/ACT inhaler Inhale 2 puffs into the lungs every 4 (four) hours as needed for shortness of breath.   amiodarone  (PACERONE ) 200 MG tablet Take 1 tablet (200 mg total) by mouth daily.   cholecalciferol  (VITAMIN D ) 1000 units tablet Take 1,000 Units by mouth daily.   dapagliflozin  propanediol (FARXIGA ) 10 MG TABS tablet Take 1 tablet (10 mg total) by mouth daily before breakfast.   docusate sodium  (COLACE) 100 MG capsule Take 1 capsule (100 mg total) by mouth 2 (two) times daily as needed for mild constipation.   dorzolamide -timolol  (COSOPT ) 22.3-6.8 MG/ML ophthalmic solution Place 1 drop into both eyes 2 (two) times daily.   ELIQUIS  5 MG TABS tablet Take 5 mg by mouth 2 (two) times daily.   feeding supplement (BOOST HIGH PROTEIN) LIQD Take 1 Container by mouth daily.   feeding supplement (ENSURE PLUS HIGH PROTEIN) LIQD Take 237 mLs by mouth 3 (three) times daily between meals.   finasteride  (PROSCAR ) 5 MG tablet Take 5 mg by mouth daily.   furosemide  (LASIX ) 20 MG tablet Take 1 tablet (20 mg total) by mouth daily.   levothyroxine  (SYNTHROID ) 88 MCG tablet 88 mcg.   mirtazapine  (REMERON ) 7.5 MG tablet Take 1 tablet (7.5 mg total) by mouth at bedtime.   Multiple Vitamin (MULTIVITAMIN WITH MINERALS) TABS tablet Take 1 tablet by mouth daily.   Nutritional Supplements (,FEEDING SUPPLEMENT, PROSOURCE PLUS) liquid Take 30 mLs by mouth 2 (two) times daily before lunch and supper.   polyethylene glycol (MIRALAX  / GLYCOLAX ) 17 g packet Take 17 g by mouth 2 (two) times daily as needed.   sildenafil (VIAGRA) 25 MG tablet Take 25  mg by mouth as needed for erectile dysfunction.   No facility-administered encounter medications on file as of 09/30/2024.     Review of Systems  Review of Systems  N/a Physical Exam  BP (!) 155/72   Pulse 63   Ht 6' 1 (1.854 m)  Wt 153 lb 3.2 oz (69.5 kg)   SpO2 91%   PF (!) 2 L/min Comment: POC  BMI 20.21 kg/m   Wt Readings from Last 5 Encounters:  09/30/24 153 lb 3.2 oz (69.5 kg)  09/08/24 152 lb 1.9 oz (69 kg)  09/01/24 155 lb (70.3 kg)  08/25/24 154 lb 1.6 oz (69.9 kg)  08/19/24 157 lb 6.4 oz (71.4 kg)    BMI Readings from Last 5 Encounters:  09/30/24 20.21 kg/m  09/08/24 20.07 kg/m  09/01/24 20.45 kg/m  08/25/24 20.33 kg/m  08/19/24 20.77 kg/m     Physical Exam General: Chronically ill-appearing sitting up in chair Eyes: EOMI Neck: Supple, no JVP appreciated Pulmonary: Clear, decent air excursion   Assessment & Plan:   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  Possibly just increased oxygen  need or higher acuity than anticipated.  Doing better with increased flow rate to 3 L.  More active etc. historically improved with prednisone .  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  Marked with pulmonary rehab.  Consider repeating in the future.  Encouraged ongoing exercise.  Repeat CT scan and PFTs in the coming weeks to months to get a baseline and better picture of he does not appear volume overloaded.  History of congestive heart failure: Appears euvolemic.  Weight is down 2 KG since discharge weight 6/25 with CHF exacerbation.   Return in about 3 months (around 12/29/2024) for f/u Dr. Annella, after PFT.   Samuel JONELLE Annella, MD 09/30/2024

## 2024-10-01 ENCOUNTER — Encounter: Admitting: Internal Medicine

## 2024-10-01 NOTE — Progress Notes (Deleted)
 Office Visit Note  Patient: Samuel Porter             Date of Birth: 01-25-1941           MRN: [id number]             PCP: Kip Righter, MD Referring: Kip Righter, MD Visit Date: 10/01/2024   Subjective:  No chief complaint on file.   History of Present Illness: Samuel Porter is a 83 y.o. 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 Abnormal results including positive ANA, elevated sed rate and CRP, and monocloncal gammopathy.   He has a history of recurrent pleural effusions, with a significant episode where 2.5 liters of fluid were drained from his chest cavity, causing pressure on his lungs. He is not currently taking steroids. He is on multiple heart medications, including amiodarone . He experienced shortness of breath prior to the fluid drainage and has had recurrent episodes of shortness of breath since then.   He was diagnosed with squamous cell cancer two years ago after discovering a lump under his throat. He underwent immunotherapy and had lymph nodes removed, which were clear. He did not receive radiation therapy. He reports significant weight loss from 200 pounds 18 months ago and has experienced dry mouth and loss of taste since the immunotherapy. He has lost two teeth and broken another, which he attributes to dry mouth and weakened teeth.   He reports weakness in his hands, noting difficulty with tasks such as writing, holding utensils, and opening jars. This has developed over the past six to eight months. No swelling in his hands but decreased strength.   He has a history of dry mouth since immunotherapy, with no significant dry eye symptoms. He uses eye drops regularly but has not used any medication for mouth dryness. No choking or food getting stuck.   He has  a history of thyroid  issues and was recently advised to adjust his medication intake to improve absorption. He reports being cold and experiencing muscle aches, which he attributes to his thyroid  condition.   He has a history of arthritis. He wears compression socks but denies significant leg swelling. He reports cold fingers but no color changes. He has a history of a 'stuck finger' that was treated surgically seven to eight years ago.    No Rheumatology ROS completed.   PMFS History:  Patient Active Problem List   Diagnosis Date Noted   Positive ANA (antinuclear antibody) 09/01/2024   Protein-calorie malnutrition, severe 03/24/2024   ILD (interstitial lung disease) (HCC) 03/20/2024   Encounter to discuss test results 03/20/2024   Pulmonary hypertension, unspecified (HCC) 03/20/2024   Acute right heart failure (HCC) 03/20/2024   Hypervolemia 03/18/2024   Hypoalbuminemia 03/18/2024   Acute on chronic combined systolic and diastolic heart failure (HCC) 03/18/2024   Persistent atrial fibrillation (HCC) 03/18/2024   Pleural effusion on right 03/16/2024   CHF (congestive heart failure) (HCC) 03/16/2024   Acute respiratory failure (HCC) 03/16/2024   Recurrent right pleural effusion 03/16/2024   Pleural effusion 03/16/2024   Encounter for monitoring amiodarone  therapy 01/23/2024   Paroxysmal atrial fibrillation (HCC) 01/08/2024   Hypercoagulable state due to paroxysmal atrial fibrillation (HCC) 01/08/2024   Incarcerated umbilical hernia 07/11/2023   Squamous cell carcinoma of skin 11/30/2022  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   OA (osteoarthritis) of hip 10/31/2016    Past Medical History:  Diagnosis Date   Arthritis    oa   BPH (benign prostatic hyperplasia)    Cancer (HCC) 1 yrs ago   melanoma removed right elbow   Cataracts, bilateral    Glaucoma    Hypertension    Vitamin D  deficiency     Family History  Problem Relation Age of Onset    Cancer Mother    Cancer Father    Cancer - Ovarian Sister    Lymphoma Sister    Heart attack Brother        COD   Healthy Son    Healthy Daughter    Healthy Daughter    Healthy Daughter    Heart attack Brother    Past Surgical History:  Procedure Laterality Date   CARDIOVERSION N/A 03/05/2024   Procedure: CARDIOVERSION;  Surgeon: Mona Vinie BROCKS, MD;  Location: [facility] INVASIVE CV LAB;  Service: Cardiovascular;  Laterality: N/A;   CARDIOVERSION N/A 03/24/2024   Procedure: CARDIOVERSION;  Surgeon: Rolan Ezra RAMAN, MD;  Location: [facility] INVASIVE CV LAB;  Service: Cardiovascular;  Laterality: N/A;   CARDIOVERSION N/A 04/24/2024   Procedure: CARDIOVERSION;  Surgeon: Okey Vina GAILS, MD;  Location: [facility] INVASIVE CV LAB;  Service: Cardiovascular;  Laterality: N/A;   CHEST TUBE INSERTION Right 03/18/2024   Procedure: CHEST TUBE INSERTION;  Surgeon: Annella Donnice SAUNDERS, MD;  Location: [facility] ENDOSCOPY;  Service: Pulmonary;  Laterality: Right;   CHOLECYSTECTOMY     colonscopy  2017   IR THORACENTESIS ASP PLEURAL SPACE W/IMG GUIDE  03/03/2024   JOINT REPLACEMENT Right    hip    RIGHT HEART CATH N/A 03/23/2024   Procedure: RIGHT HEART CATH;  Surgeon: Rolan Ezra RAMAN, MD;  Location: [facility] INVASIVE CV LAB;  Service: Cardiovascular;  Laterality: N/A;   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   Procedure: LEFT TOTAL HIP ARTHROPLASTY ANTERIOR APPROACH;  Surgeon: Dempsey Moan, MD;  Location: [facility] ORS;  Service: Orthopedics;  Laterality: Left;   VENTRAL HERNIA REPAIR N/A 07/12/2023   Procedure: HERNIA REPAIR VENTRAL;  Surgeon: Lyndel Deward PARAS, MD;  Location: [facility] OR;  Service: General;  Laterality: N/A;   Social History   Social History Narrative   Not on file   Immunization History  Administered Date(s) Administered   Influenza-Unspecified 08/16/2023   PFIZER Comirnaty (Gray Top)Covid-19 Tri-Sucrose Vaccine 05/05/2021   PFIZER(Purple Top)SARS-COV-2 Vaccination 11/04/2019, 11/22/2019, 10/01/2020    Pfizer(Comirnaty )Fall Seasonal Vaccine 12 years and older 07/16/2024     Objective: Vital Signs: There were no vitals taken for this visit.   Physical Exam   Musculoskeletal Exam: ***  CDAI Exam: CDAI Score: -- Patient Global: --; Provider Global: -- Swollen: --; Tender: -- Joint Exam 10/01/2024   No joint exam has been documented for this visit   There is currently no information documented on the homunculus. Go to the Rheumatology activity and complete the homunculus joint exam.  Investigation: No additional findings.  Imaging: No results found.  Recent Labs: Lab Results  Component Value Date   WBC 4.2 06/02/2024   HGB 12.4 (L) 06/02/2024   PLT 152 06/02/2024   NA 139 08/19/2024   K 4.2 08/19/2024   CL 100 08/19/2024   CO2 26 08/19/2024   GLUCOSE 120 (H) 08/19/2024   BUN 23 08/19/2024   CREATININE 1.21 08/19/2024   BILITOT 0.7 08/19/2024   ALKPHOS 92 08/19/2024   AST 20 08/19/2024  ALT 14 08/19/2024   PROT 6.8 08/19/2024   ALBUMIN  3.1 (L) 08/19/2024   CALCIUM 8.8 08/19/2024   GFRAA >60 06/17/2018    Speciality Comments: No specialty comments available.  Procedures:  No procedures performed Allergies: Patient has no known allergies.   Assessment / Plan:     Visit Diagnoses: No diagnosis found.  ***  Orders: No orders of the defined types were placed in this encounter.  No orders of the defined types were placed in this encounter.    Follow-Up Instructions: No follow-ups on file.   Lonni LELON Ester, MD  Note - This record has been created using [employer].  Chart creation errors have been sought, but may not always  have been located. Such creation errors do not reflect on  the standard of medical care.

## 2024-10-09 ENCOUNTER — Other Ambulatory Visit

## 2024-10-13 ENCOUNTER — Other Ambulatory Visit

## 2024-10-19 NOTE — Progress Notes (Signed)
 "  Office Visit Note  Patient: Samuel Porter             Date of Birth: 10-Jan-1941           MRN: [id number]             Visit Date: 10/20/2024  PCP: Kip Righter, MD   Subjective:   Chief Complaint: Follow-up of the Left Wrist and Follow-up of the Right Wrist  History of Present Illness: Samuel Porter is a 84 y.o. 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 At his last visit, he presented with symptoms of generalized arthritis, dry mouth, morning stiffness, and fatigue.  He is feeling okay today. He endorses continued stiffness and weakness in his fingers and wrists, but denies any pain, swelling, or redness. Still has difficulty holding utensils and opening jars and other activities of daily living. He denies any new or worsening symptoms. X-rays of bilateral hands at last visit showed degenerative damage of both wrists with no evidence of erosions or bone density loss.   He is followed closely by pulmonology for interstitial lung disease and is not currently taking prednisone , as well as by cardiology for atrial fibrillation and congestive heart failure taking amiodarone  and mirtazipine. He is also has a long standing history of hypothyroidism and is taking levothyroxine . Of note, he endorses having a constantly runny nose since starting on daily oxygen  at home. He is currently on 2-3 L.   Otherwise, patient has not had any other significant health changes since last visit.   Previous Medication Trials: N/A  Last Labs: 09/01/2024- Sed Rate 39, CRP 21.5, C3/C4 WNL, dsDNA 19, SSA Pos+  Review of Systems: Review of Systems  Constitutional:  Positive for fatigue.  HENT:  Positive for mouth dryness. Negative for mouth sores.   Eyes:  Negative for dryness.  Respiratory:  Negative for shortness of breath.   Cardiovascular:  Negative for chest pain and  palpitations.  Gastrointestinal:  Negative for blood in stool, constipation and diarrhea.  Endocrine: Negative for increased urination.  Genitourinary:  Negative for involuntary urination.  Musculoskeletal:  Positive for gait problem, joint swelling and morning stiffness. Negative for joint pain, joint pain, myalgias, muscle weakness, muscle tenderness and myalgias.  Skin:  Negative for color change, rash, hair loss and sensitivity to sunlight.  Allergic/Immunologic: Negative for susceptible to infections.  Neurological:  Negative for dizziness and headaches.  Hematological:  Negative for swollen glands.  Psychiatric/Behavioral:  Negative for depressed mood and sleep disturbance. The patient is not nervous/anxious.     Objective: Vital Signs: BP 132/63   Pulse (!) 53   Temp 97.9 F (36.6 C)   Resp 12   Ht 6' 1 (1.854 m)   Wt 154 lb 12.8 oz (70.2 kg)   BMI 20.42 kg/m   Physical Exam Constitutional:      Appearance: Normal appearance. He is well-developed.  HENT:     Head: Normocephalic and atraumatic. No right periorbital erythema or left periorbital erythema.     Mouth/Throat:     Mouth: Mucous membranes are dry.     Dentition: Abnormal dentition.  Eyes:     General: Lids are normal.        Right eye: No discharge.        Left eye: No discharge.     Extraocular Movements: Extraocular movements intact.  Conjunctiva/sclera: Conjunctivae normal.  Cardiovascular:     Rate and Rhythm: Normal rate and regular rhythm.     Heart sounds: Normal heart sounds. No murmur heard.    No friction rub. No gallop. No S3 or S4 sounds.  Pulmonary:     Effort: Pulmonary effort is normal.     Breath sounds: Normal breath sounds. No stridor. No decreased breath sounds, wheezing, rhonchi or rales.  Chest:     Chest wall: No deformity.  Musculoskeletal:     Comments: There is no tenderness, swelling, warmth, or erythema in his hands or wrists. Decreased ROM and mobility in bilateral wrists.  Left 5th finger partially contracted.  Lymphadenopathy:     Cervical: No cervical adenopathy.     Upper Body:     Right upper body: No supraclavicular adenopathy.     Left upper body: No supraclavicular adenopathy.  Skin:    General: Skin is warm and dry.     Capillary Refill: Capillary refill takes less than 2 seconds.     Findings: No rash.  Neurological:     Mental Status: He is alert.  Psychiatric:        Behavior: Behavior is cooperative.    Problem List:  Patient Active Problem List   Diagnosis Date Noted   Positive ANA (antinuclear antibody) 09/01/2024   Protein-calorie malnutrition, severe 03/24/2024   ILD (interstitial lung disease) (HCC) 03/20/2024   Encounter to discuss test results 03/20/2024   Pulmonary hypertension, unspecified (HCC) 03/20/2024   Acute right heart failure (HCC) 03/20/2024   Hypervolemia 03/18/2024   Hypoalbuminemia 03/18/2024   Acute on chronic combined systolic and diastolic heart failure (HCC) 03/18/2024   Persistent atrial fibrillation (HCC) 03/18/2024   Pleural effusion on right 03/16/2024   CHF (congestive heart failure) (HCC) 03/16/2024   Acute respiratory failure (HCC) 03/16/2024   Recurrent right pleural effusion 03/16/2024   Pleural effusion 03/16/2024   Encounter for monitoring amiodarone  therapy 01/23/2024   Paroxysmal atrial fibrillation (HCC) 01/08/2024   Hypercoagulable state due to paroxysmal atrial fibrillation (HCC) 01/08/2024   Incarcerated umbilical hernia 07/11/2023   Squamous cell carcinoma of skin 11/30/2022   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   OA (osteoarthritis) of hip 10/31/2016   PMFS History:  History: Past Medical History:  Diagnosis Date   Arthritis    oa   BPH (benign prostatic hyperplasia)    Cancer (HCC) 1 yrs ago   melanoma removed right elbow   Cataracts, bilateral    Glaucoma    Hypertension    Vitamin D  deficiency     Family History  Problem Relation Age of  Onset   Cancer Mother    Cancer Father    Cancer - Ovarian Sister    Lymphoma Sister    Heart attack Brother        COD   Healthy Son    Healthy Daughter    Healthy Daughter    Healthy Daughter    Heart attack Brother    Past Surgical History:  Procedure Laterality Date   CARDIOVERSION N/A 03/05/2024   Procedure: CARDIOVERSION;  Surgeon: Mona Vinie BROCKS, MD;  Location: [facility] INVASIVE CV LAB;  Service: Cardiovascular;  Laterality: N/A;   CARDIOVERSION N/A 03/24/2024   Procedure: CARDIOVERSION;  Surgeon: Rolan Ezra RAMAN, MD;  Location: [facility] INVASIVE CV LAB;  Service: Cardiovascular;  Laterality: N/A;   CARDIOVERSION N/A 04/24/2024   Procedure: CARDIOVERSION;  Surgeon: Okey Vina GAILS, MD;  Location: [facility] INVASIVE CV LAB;  Service: Cardiovascular;  Laterality: N/A;   CHEST TUBE INSERTION Right 03/18/2024   Procedure: CHEST TUBE INSERTION;  Surgeon: Annella Donnice SAUNDERS, MD;  Location: [facility] ENDOSCOPY;  Service: Pulmonary;  Laterality: Right;   CHOLECYSTECTOMY     colonscopy  2017   IR THORACENTESIS ASP PLEURAL SPACE W/IMG GUIDE  03/03/2024   JOINT REPLACEMENT Right    hip    RIGHT HEART CATH N/A 03/23/2024   Procedure: RIGHT HEART CATH;  Surgeon: Rolan Ezra RAMAN, MD;  Location: [facility] INVASIVE CV LAB;  Service: Cardiovascular;  Laterality: N/A;   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   Procedure: LEFT TOTAL HIP ARTHROPLASTY ANTERIOR APPROACH;  Surgeon: Dempsey Moan, MD;  Location: [facility] ORS;  Service: Orthopedics;  Laterality: Left;   VENTRAL HERNIA REPAIR N/A 07/12/2023   Procedure: HERNIA REPAIR VENTRAL;  Surgeon: Lyndel Deward PARAS, MD;  Location: [facility] OR;  Service: General;  Laterality: N/A;   Social History   Social History Narrative   Not on file   Allergies: Patient has no known allergies.  Immunization status:  Immunization History  Administered Date(s) Administered   Influenza-Unspecified 08/16/2023   PFIZER Comirnaty (Gray Top)Covid-19 Tri-Sucrose Vaccine 05/05/2021   PFIZER(Purple  Top)SARS-COV-2 Vaccination 11/04/2019, 11/22/2019, 10/01/2020   Pfizer(Comirnaty )Fall Seasonal Vaccine 12 years and older 07/16/2024     Assessment:  Visit Diagnoses:  Sjogren's syndrome, with unspecified organ involvement Positive ANA  With a positive SSA Ro antibody as well as symptomatic xerostomia, diagnosis of Sjogren's syndrome is given. ANA and dSdna positive along with elevated CRP/ESR. X-rays of both hands at last visit did not show any evidence of inflammatory arthritis. Will start on him on hydroxychlorquine 200 mg every day, which is preferred over methotrexate given his cancer history. Discussed trial of pilocarpine  to provide symptomatic relief. Will recheck his labs at our next visit in 2 months. -Start pilocarpine  5 mg twice daily.  -Start hydroxychlorquine 200 mg every day. - Risks, benefits, and alternatives to hydroxychloroquine  (Plaquenil ) discussed today and patient expressed understanding. Counseled that it may take 1-2 months to see clinical improvement. Discussed potential side effects, including nausea, diarrhea, and visual disturbances. Patient counseled about the importance of annual or biannual eye exams and regular bloodwork monitoring.  Primary osteoarthritis of both wrists X-rays of both hands and wrists at last visit did not show any evidence of inflammatory arthritis. Symptoms have not worsened. He asked me about a Neurivo hand glove and if that would improve his symptoms. It appears to be a glove that provides hand massage and heat. I see no problem with him using this if it provides relief. He could also try a regular compression glove.  ILD (interstitial lung disease) Acute on chronic combined systolic and diastolic heart failure  Paroxysmal atrial fibrillation  Patient is currently on amiodarone  and mirtazapine . I discussed with patient's pulmonologist and cardiologist in regards to any contraindications to starting him on both hydroxychlorquine and  pilocarpine . No issues with starting pilocarpine . The cardiology pharmacist was slightly concerned about the potential for qtc prolongation given the longer half life of hydroxychlorquine and recommended starting him at the lowest possible dose. His EKG looked good at his last cardiology appointment with no evidence of Qtc prolongation. They plan to reach out to the patient and recheck his EKG in 2 weeks.   History of squamous cell carcinoma of skin Post treatment complications include broken teeth and xerostomia, which is not uncommon after head/neck cancer treatment. No signs of sialadenitis. Continue follow up with oncologist and otolaryngologist as previously  scheduled.   Acquired hypothyroidism Currently followed by endocrinology. Continue current levothyroxine  regimen.   Follow-Up Instructions:  Return in about 2 months (around 12/18/2024) for sjogrens.   Orders: No orders of the defined types were placed in this encounter.   Meds ordered this encounter  Medications   hydroxychloroquine  (PLAQUENIL ) 200 MG tablet    Sig: Take 1 tablet (200 mg total) by mouth daily.    Dispense:  90 tablet    Refill:  0   pilocarpine  (SALAGEN ) 5 MG tablet    Sig: Take 1 tablet (5 mg total) by mouth 2 (two) times daily.    Dispense:  180 tablet    Refill:  1    Tayli Buch S Bunn, PA-C  Note - This record has been created using [employer]. Chart creation errors have been sought, but may not always have been located. Such creation errors do not reflect on the standard of medical care.   "

## 2024-10-20 ENCOUNTER — Encounter

## 2024-10-20 VITALS — BP 132/63 | HR 53 | Temp 97.9°F | Resp 12 | Ht 73.0 in | Wt 154.8 lb

## 2024-10-20 DIAGNOSIS — M19042 Primary osteoarthritis, left hand: Secondary | ICD-10-CM | POA: Insufficient documentation

## 2024-10-20 DIAGNOSIS — I5043 Acute on chronic combined systolic (congestive) and diastolic (congestive) heart failure: Secondary | ICD-10-CM | POA: Diagnosis not present

## 2024-10-20 DIAGNOSIS — E039 Hypothyroidism, unspecified: Secondary | ICD-10-CM | POA: Diagnosis not present

## 2024-10-20 DIAGNOSIS — I480 Paroxysmal atrial fibrillation: Secondary | ICD-10-CM | POA: Diagnosis not present

## 2024-10-20 DIAGNOSIS — Z85828 Personal history of other malignant neoplasm of skin: Secondary | ICD-10-CM | POA: Insufficient documentation

## 2024-10-20 DIAGNOSIS — M19041 Primary osteoarthritis, right hand: Secondary | ICD-10-CM | POA: Insufficient documentation

## 2024-10-20 DIAGNOSIS — R7689 Other specified abnormal immunological findings in serum: Secondary | ICD-10-CM | POA: Diagnosis not present

## 2024-10-20 DIAGNOSIS — M3500 Sicca syndrome, unspecified: Secondary | ICD-10-CM | POA: Insufficient documentation

## 2024-10-20 DIAGNOSIS — M1990 Unspecified osteoarthritis, unspecified site: Secondary | ICD-10-CM

## 2024-10-20 DIAGNOSIS — C4492 Squamous cell carcinoma of skin, unspecified: Secondary | ICD-10-CM

## 2024-10-20 DIAGNOSIS — J849 Interstitial pulmonary disease, unspecified: Secondary | ICD-10-CM | POA: Insufficient documentation

## 2024-10-20 DIAGNOSIS — J90 Pleural effusion, not elsewhere classified: Secondary | ICD-10-CM

## 2024-10-20 MED ORDER — HYDROXYCHLOROQUINE SULFATE 200 MG PO TABS: 200.0000 mg | ORAL_TABLET | Freq: Every day | ORAL | 0 refills | Status: AC

## 2024-10-20 MED ORDER — PILOCARPINE HCL 5 MG PO TABS: 5.0000 mg | ORAL_TABLET | Freq: Two times a day (BID) | ORAL | 1 refills | Status: AC

## 2024-10-31 ENCOUNTER — Other Ambulatory Visit (HOSPITAL_COMMUNITY): Payer: Self-pay | Admitting: Physician Assistant

## 2024-11-03 ENCOUNTER — Ambulatory Visit (HOSPITAL_COMMUNITY): Admitting: Nurse Practitioner

## 2024-11-06 ENCOUNTER — Telehealth: Payer: Self-pay

## 2024-11-06 ENCOUNTER — Telehealth: Payer: Self-pay | Admitting: Cardiovascular Disease

## 2024-11-06 NOTE — Telephone Encounter (Signed)
 Contacted patient to advise Please call patient's daughter and advise him to check with his Cardiologist. Plaquenil  can cause a slow heart rate or other heart rhythm problems as part of its rare cardiovascular side effects. His cardiologist is following him closely since starting the medication.  Patient's daughter stated she would call his cardiologist.

## 2024-11-06 NOTE — Telephone Encounter (Signed)
 Pt c/o medication issue:  1. Name of Medication: hydroxychloroquine  (PLAQUENIL ) 200 MG tablet  2. How are you currently taking this medication (dosage and times per day)? As written  3. Are you having a reaction (difficulty breathing--STAT)? No   4. What is your medication issue? Pt daughter would like a c/b regarding above medication should be held due to low HR .Pt's Rheumatologist suggested that daughter gets Cardiologist input. Please advice

## 2024-11-06 NOTE — Telephone Encounter (Signed)
 Patient's daughter contacted office and stated that the patient has been on PLQ for 2 weeks now. Patient has had a low heart rate for the past two days during the middle of the day, seems to recover during the evening hours. Patient is currently on oxygen . Patient's daughter would like to know if it could be the PLQ, would like some advise on what to do.

## 2024-11-06 NOTE — Telephone Encounter (Signed)
 Left message for patient to call back

## 2024-11-07 NOTE — Telephone Encounter (Signed)
 Spoke with patient's daughter. His HR was in the 30 this week and he felt poorly, now feeling better off of plaquenil . I suspect this medicine was causing worsening bradycardia and recommended that it should not be restarted. thx

## 2024-11-10 ENCOUNTER — Ambulatory Visit (HOSPITAL_COMMUNITY): Admitting: Physician Assistant

## 2024-11-18 ENCOUNTER — Ambulatory Visit
Admission: RE | Admit: 2024-11-18 | Discharge: 2024-11-18 | Disposition: A | Source: Ambulatory Visit | Attending: Pulmonary Disease | Admitting: Pulmonary Disease

## 2024-11-18 ENCOUNTER — Ambulatory Visit (HOSPITAL_COMMUNITY): Admitting: Physician Assistant

## 2024-11-18 DIAGNOSIS — J849 Interstitial pulmonary disease, unspecified: Secondary | ICD-10-CM

## 2024-11-25 ENCOUNTER — Other Ambulatory Visit

## 2024-12-02 ENCOUNTER — Ambulatory Visit: Admitting: "Endocrinology

## 2024-12-16 ENCOUNTER — Other Ambulatory Visit

## 2024-12-16 ENCOUNTER — Ambulatory Visit: Admitting: Hematology and Oncology

## 2024-12-21 ENCOUNTER — Ambulatory Visit: Admitting: Pulmonary Disease

## 2024-12-21 ENCOUNTER — Encounter

## 2024-12-23 ENCOUNTER — Encounter

## 2025-02-17 ENCOUNTER — Ambulatory Visit (HOSPITAL_COMMUNITY): Admitting: Physician Assistant
